# Patient Record
Sex: Female | Born: 1940 | Race: White | Hispanic: No | Smoking: Never smoker
Health system: Southern US, Community
[De-identification: ages and names within clinical notes are randomized; demographics above are authoritative.]

## PROBLEM LIST (undated history)

## (undated) DIAGNOSIS — M1990 Unspecified osteoarthritis, unspecified site: Secondary | ICD-10-CM

## (undated) DIAGNOSIS — I4891 Unspecified atrial fibrillation: Secondary | ICD-10-CM

## (undated) DIAGNOSIS — I639 Cerebral infarction, unspecified: Secondary | ICD-10-CM

## (undated) DIAGNOSIS — I10 Essential (primary) hypertension: Secondary | ICD-10-CM

## (undated) DIAGNOSIS — G709 Myoneural disorder, unspecified: Secondary | ICD-10-CM

## (undated) DIAGNOSIS — E039 Hypothyroidism, unspecified: Secondary | ICD-10-CM

## (undated) DIAGNOSIS — Z9889 Other specified postprocedural states: Secondary | ICD-10-CM

## (undated) DIAGNOSIS — K219 Gastro-esophageal reflux disease without esophagitis: Secondary | ICD-10-CM

## (undated) DIAGNOSIS — I509 Heart failure, unspecified: Secondary | ICD-10-CM

## (undated) DIAGNOSIS — D649 Anemia, unspecified: Secondary | ICD-10-CM

## (undated) DIAGNOSIS — I428 Other cardiomyopathies: Secondary | ICD-10-CM

## (undated) DIAGNOSIS — Z950 Presence of cardiac pacemaker: Secondary | ICD-10-CM

## (undated) DIAGNOSIS — T7840XA Allergy, unspecified, initial encounter: Secondary | ICD-10-CM

## (undated) DIAGNOSIS — I447 Left bundle-branch block, unspecified: Secondary | ICD-10-CM

## (undated) DIAGNOSIS — H269 Unspecified cataract: Secondary | ICD-10-CM

## (undated) DIAGNOSIS — N189 Chronic kidney disease, unspecified: Secondary | ICD-10-CM

## (undated) DIAGNOSIS — E785 Hyperlipidemia, unspecified: Secondary | ICD-10-CM

## (undated) DIAGNOSIS — S73004A Unspecified dislocation of right hip, initial encounter: Secondary | ICD-10-CM

## (undated) DIAGNOSIS — J449 Chronic obstructive pulmonary disease, unspecified: Secondary | ICD-10-CM

## (undated) DIAGNOSIS — M5030 Other cervical disc degeneration, unspecified cervical region: Secondary | ICD-10-CM

## (undated) DIAGNOSIS — Z5189 Encounter for other specified aftercare: Secondary | ICD-10-CM

## (undated) HISTORY — DX: Essential (primary) hypertension: I10

## (undated) HISTORY — PX: OTHER SURGICAL HISTORY: SHX169

## (undated) HISTORY — DX: Unspecified osteoarthritis, unspecified site: M19.90

## (undated) HISTORY — DX: Unspecified dislocation of right hip, initial encounter: S73.004A

## (undated) HISTORY — DX: Other specified postprocedural states: Z98.890

## (undated) HISTORY — DX: Gastro-esophageal reflux disease without esophagitis: K21.9

## (undated) HISTORY — DX: Allergy, unspecified, initial encounter: T78.40XA

## (undated) HISTORY — DX: Cerebral infarction, unspecified: I63.9

## (undated) HISTORY — DX: Unspecified atrial fibrillation: I48.91

## (undated) HISTORY — DX: Other cardiomyopathies: I42.8

## (undated) HISTORY — PX: BREAST BIOPSY: SHX20

## (undated) HISTORY — DX: Chronic obstructive pulmonary disease, unspecified: J44.9

## (undated) HISTORY — PX: THYROIDECTOMY: SHX17

## (undated) HISTORY — DX: Heart failure, unspecified: I50.9

## (undated) HISTORY — PX: ABDOMINAL HYSTERECTOMY: SHX81

## (undated) HISTORY — DX: Unspecified cataract: H26.9

## (undated) HISTORY — PX: APPENDECTOMY: SHX54

## (undated) HISTORY — DX: Other cervical disc degeneration, unspecified cervical region: M50.30

## (undated) HISTORY — PX: CHOLECYSTECTOMY: SHX55

## (undated) HISTORY — DX: Chronic kidney disease, unspecified: N18.9

## (undated) HISTORY — DX: Anemia, unspecified: D64.9

## (undated) HISTORY — DX: Hyperlipidemia, unspecified: E78.5

## (undated) HISTORY — PX: MANDIBLE SURGERY: SHX707

## (undated) HISTORY — DX: Hypothyroidism, unspecified: E03.9

---
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
## 1968-07-17 HISTORY — PX: OTHER SURGICAL HISTORY: SHX169

## 2001-04-29 ENCOUNTER — Ambulatory Visit (HOSPITAL_COMMUNITY): Admission: RE | Admit: 2001-04-29 | Discharge: 2001-04-29 | Payer: Self-pay | Admitting: Pulmonary Disease

## 2001-05-09 ENCOUNTER — Encounter: Payer: Self-pay | Admitting: Internal Medicine

## 2001-05-09 ENCOUNTER — Ambulatory Visit (HOSPITAL_COMMUNITY): Admission: RE | Admit: 2001-05-09 | Discharge: 2001-05-09 | Payer: Self-pay | Admitting: Internal Medicine

## 2001-05-09 ENCOUNTER — Encounter: Payer: Self-pay | Admitting: Cardiology

## 2001-05-15 ENCOUNTER — Ambulatory Visit (HOSPITAL_COMMUNITY): Admission: RE | Admit: 2001-05-15 | Discharge: 2001-05-16 | Payer: Self-pay | Admitting: *Deleted

## 2001-05-28 ENCOUNTER — Encounter: Payer: Self-pay | Admitting: Cardiology

## 2001-08-20 ENCOUNTER — Other Ambulatory Visit: Admission: RE | Admit: 2001-08-20 | Discharge: 2001-08-20 | Payer: Self-pay | Admitting: Obstetrics and Gynecology

## 2002-07-21 ENCOUNTER — Ambulatory Visit (HOSPITAL_COMMUNITY): Admission: RE | Admit: 2002-07-21 | Discharge: 2002-07-21 | Payer: Self-pay | Admitting: Cardiology

## 2002-09-22 ENCOUNTER — Encounter: Payer: Self-pay | Admitting: Orthopaedic Surgery

## 2002-09-22 ENCOUNTER — Ambulatory Visit (HOSPITAL_COMMUNITY): Admission: RE | Admit: 2002-09-22 | Discharge: 2002-09-22 | Payer: Self-pay | Admitting: Orthopaedic Surgery

## 2004-03-04 ENCOUNTER — Encounter: Admission: RE | Admit: 2004-03-04 | Discharge: 2004-03-04 | Payer: Self-pay | Admitting: Orthopedic Surgery

## 2004-03-22 ENCOUNTER — Encounter: Admission: RE | Admit: 2004-03-22 | Discharge: 2004-03-22 | Payer: Self-pay | Admitting: Orthopedic Surgery

## 2004-04-11 ENCOUNTER — Encounter: Admission: RE | Admit: 2004-04-11 | Discharge: 2004-04-11 | Payer: Self-pay | Admitting: Orthopedic Surgery

## 2004-06-10 ENCOUNTER — Ambulatory Visit: Payer: Self-pay | Admitting: Cardiology

## 2004-06-14 ENCOUNTER — Ambulatory Visit: Payer: Self-pay | Admitting: Cardiology

## 2004-06-14 ENCOUNTER — Ambulatory Visit (HOSPITAL_COMMUNITY): Admission: RE | Admit: 2004-06-14 | Discharge: 2004-06-14 | Payer: Self-pay | Admitting: Cardiology

## 2005-02-22 ENCOUNTER — Encounter: Payer: Self-pay | Admitting: Emergency Medicine

## 2005-02-22 ENCOUNTER — Ambulatory Visit: Payer: Self-pay | Admitting: Cardiology

## 2005-02-22 ENCOUNTER — Inpatient Hospital Stay (HOSPITAL_COMMUNITY): Admission: AD | Admit: 2005-02-22 | Discharge: 2005-02-23 | Payer: Self-pay | Admitting: Cardiology

## 2005-02-23 ENCOUNTER — Encounter: Payer: Self-pay | Admitting: Cardiology

## 2005-02-28 ENCOUNTER — Ambulatory Visit: Payer: Self-pay | Admitting: *Deleted

## 2005-03-30 ENCOUNTER — Ambulatory Visit: Payer: Self-pay | Admitting: Cardiology

## 2005-04-06 ENCOUNTER — Ambulatory Visit: Payer: Self-pay | Admitting: *Deleted

## 2005-05-01 ENCOUNTER — Ambulatory Visit: Payer: Self-pay | Admitting: Cardiology

## 2006-02-08 ENCOUNTER — Encounter: Admission: RE | Admit: 2006-02-08 | Discharge: 2006-02-08 | Payer: Self-pay | Admitting: Orthopedic Surgery

## 2006-03-01 ENCOUNTER — Encounter: Admission: RE | Admit: 2006-03-01 | Discharge: 2006-03-01 | Payer: Self-pay | Admitting: Orthopedic Surgery

## 2006-04-20 ENCOUNTER — Ambulatory Visit: Payer: Self-pay | Admitting: Cardiology

## 2006-04-24 ENCOUNTER — Encounter: Admission: RE | Admit: 2006-04-24 | Discharge: 2006-04-24 | Payer: Self-pay | Admitting: Orthopedic Surgery

## 2006-11-30 ENCOUNTER — Ambulatory Visit (HOSPITAL_COMMUNITY): Admission: RE | Admit: 2006-11-30 | Discharge: 2006-11-30 | Payer: Self-pay | Admitting: *Deleted

## 2007-02-25 ENCOUNTER — Ambulatory Visit: Payer: Self-pay

## 2007-03-22 ENCOUNTER — Ambulatory Visit: Payer: Self-pay | Admitting: Cardiology

## 2007-07-18 DIAGNOSIS — Z9889 Other specified postprocedural states: Secondary | ICD-10-CM

## 2007-07-18 HISTORY — PX: COLONOSCOPY: SHX174

## 2007-07-18 HISTORY — DX: Other specified postprocedural states: Z98.890

## 2008-06-18 ENCOUNTER — Ambulatory Visit: Payer: Self-pay | Admitting: Gastroenterology

## 2008-06-29 ENCOUNTER — Ambulatory Visit: Payer: Self-pay | Admitting: Gastroenterology

## 2008-06-29 LAB — HM COLONOSCOPY

## 2008-07-16 ENCOUNTER — Ambulatory Visit: Payer: Self-pay | Admitting: Cardiology

## 2008-07-16 ENCOUNTER — Ambulatory Visit (HOSPITAL_COMMUNITY): Admission: RE | Admit: 2008-07-16 | Discharge: 2008-07-16 | Payer: Self-pay | Admitting: Cardiology

## 2008-07-16 LAB — CONVERTED CEMR LAB
ALT: 18 units/L
Albumin: 4.3 g/dL
Bilirubin, Direct: 0.4 mg/dL
CO2: 25 meq/L
Chloride: 102 meq/L
Creatinine, Ser: 1.07 mg/dL
Glucose, Bld: 83 mg/dL
HCT: 39.3 %
Hemoglobin: 12.9 g/dL
Platelets: 248 10*3/uL
Potassium: 4 meq/L
Sodium: 141 meq/L
TSH: 1.044 microintl units/mL
Total Protein: 7.2 g/dL

## 2008-12-03 ENCOUNTER — Encounter: Payer: Self-pay | Admitting: Cardiology

## 2008-12-04 ENCOUNTER — Encounter (INDEPENDENT_AMBULATORY_CARE_PROVIDER_SITE_OTHER): Payer: Self-pay | Admitting: *Deleted

## 2008-12-07 ENCOUNTER — Encounter: Payer: Self-pay | Admitting: Cardiology

## 2009-01-01 ENCOUNTER — Encounter (INDEPENDENT_AMBULATORY_CARE_PROVIDER_SITE_OTHER): Payer: Self-pay

## 2009-01-01 LAB — CONVERTED CEMR LAB
AST: 17 units/L
Albumin: 4 g/dL
Alkaline Phosphatase: 59 units/L
Bilirubin, Direct: 0.1 mg/dL
TSH: 0.246 microintl units/mL
Triglycerides: 66 mg/dL

## 2009-04-28 ENCOUNTER — Ambulatory Visit: Payer: Self-pay | Admitting: Cardiology

## 2009-04-28 ENCOUNTER — Encounter (INDEPENDENT_AMBULATORY_CARE_PROVIDER_SITE_OTHER): Payer: Self-pay

## 2009-04-28 ENCOUNTER — Encounter (INDEPENDENT_AMBULATORY_CARE_PROVIDER_SITE_OTHER): Payer: Self-pay | Admitting: *Deleted

## 2009-04-28 DIAGNOSIS — E039 Hypothyroidism, unspecified: Secondary | ICD-10-CM

## 2009-04-28 DIAGNOSIS — M1990 Unspecified osteoarthritis, unspecified site: Secondary | ICD-10-CM | POA: Insufficient documentation

## 2009-04-28 DIAGNOSIS — I428 Other cardiomyopathies: Secondary | ICD-10-CM

## 2009-04-28 DIAGNOSIS — I447 Left bundle-branch block, unspecified: Secondary | ICD-10-CM

## 2009-04-28 DIAGNOSIS — E785 Hyperlipidemia, unspecified: Secondary | ICD-10-CM

## 2009-04-28 DIAGNOSIS — I10 Essential (primary) hypertension: Secondary | ICD-10-CM

## 2009-04-28 LAB — CONVERTED CEMR LAB
AST: 18 units/L
Alkaline Phosphatase: 69 units/L
Alkaline Phosphatase: 69 units/L (ref 39–117)
CO2: 25 meq/L
Calcium: 8.8 mg/dL
Chloride: 100 meq/L
Creatinine, Ser: 0.88 mg/dL
Glucose, Bld: 78 mg/dL
Potassium: 3.8 meq/L
Total Protein: 6.8 g/dL

## 2009-10-04 ENCOUNTER — Encounter (INDEPENDENT_AMBULATORY_CARE_PROVIDER_SITE_OTHER): Payer: Self-pay | Admitting: *Deleted

## 2009-10-04 LAB — CONVERTED CEMR LAB
CO2: 24 meq/L
Creatinine, Ser: 0.99 mg/dL
Eosinophils Relative: 2 %
HCT: 40.3 %
HDL: 42 mg/dL
Hemoglobin: 13.2 g/dL
LDL Cholesterol: 158 mg/dL
Lymphocytes Relative: 23 %
Lymphs Abs: 1.5 10*3/uL
MCV: 91.8 fL
Monocytes Absolute: 0.5 10*3/uL
Monocytes Relative: 8 %
Platelets: 226 10*3/uL
RDW: 13.4 %
Sodium: 138 meq/L
TSH: 0.353 microintl units/mL
Total Protein: 6.8 g/dL

## 2010-05-24 ENCOUNTER — Encounter (INDEPENDENT_AMBULATORY_CARE_PROVIDER_SITE_OTHER): Payer: Self-pay | Admitting: *Deleted

## 2010-05-30 ENCOUNTER — Encounter (INDEPENDENT_AMBULATORY_CARE_PROVIDER_SITE_OTHER): Payer: Self-pay | Admitting: *Deleted

## 2010-05-31 ENCOUNTER — Ambulatory Visit: Payer: Self-pay | Admitting: Cardiology

## 2010-06-02 ENCOUNTER — Encounter (INDEPENDENT_AMBULATORY_CARE_PROVIDER_SITE_OTHER): Payer: Self-pay | Admitting: *Deleted

## 2010-06-02 LAB — CONVERTED CEMR LAB
ALT: 16 units/L (ref 0–35)
Alkaline Phosphatase: 64 units/L (ref 39–117)
BUN: 15 mg/dL (ref 6–23)
Basophils Relative: 1 % (ref 0–1)
CO2: 27 meq/L (ref 19–32)
Chloride: 99 meq/L (ref 96–112)
Creatinine, Ser: 1.07 mg/dL (ref 0.40–1.20)
Eosinophils Absolute: 0.1 10*3/uL (ref 0.0–0.7)
Glucose, Bld: 84 mg/dL (ref 70–99)
Lymphocytes Relative: 29 % (ref 12–46)
MCHC: 33.2 g/dL (ref 30.0–36.0)
MCV: 90.2 fL (ref 78.0–100.0)
Monocytes Absolute: 0.8 10*3/uL (ref 0.1–1.0)
Monocytes Relative: 9 % (ref 3–12)
Neutrophils Relative %: 60 % (ref 43–77)
Potassium: 4 meq/L (ref 3.5–5.3)
RDW: 13.9 % (ref 11.5–15.5)
Total Bilirubin: 0.4 mg/dL (ref 0.3–1.2)

## 2010-07-12 ENCOUNTER — Encounter: Payer: Self-pay | Admitting: Cardiology

## 2010-07-12 LAB — CONVERTED CEMR LAB: TSH: 2.02 microintl units/mL (ref 0.350–4.500)

## 2010-08-02 ENCOUNTER — Telehealth (INDEPENDENT_AMBULATORY_CARE_PROVIDER_SITE_OTHER): Payer: Self-pay | Admitting: *Deleted

## 2010-08-16 NOTE — Miscellaneous (Signed)
Summary: labs cmp,04/28/2009  Clinical Lists Changes  Observations: Added new observation of CALCIUM: 8.8 mg/dL (16/04/9603 54:09) Added new observation of ALBUMIN: 4.4 g/dL (81/19/1478 29:56) Added new observation of PROTEIN, TOT: 6.8 g/dL (21/30/8657 84:69) Added new observation of SGPT (ALT): 17 units/L (04/28/2009 10:34) Added new observation of SGOT (AST): 18 units/L (04/28/2009 10:34) Added new observation of ALK PHOS: 69 units/L (04/28/2009 10:34) Added new observation of CREATININE: 0.88 mg/dL (62/95/2841 32:44) Added new observation of BUN: 12 mg/dL (07/19/7251 66:44) Added new observation of BG RANDOM: 78 mg/dL (03/47/4259 56:38) Added new observation of CO2 PLSM/SER: 25 meq/L (04/28/2009 10:34) Added new observation of CL SERUM: 100 meq/L (04/28/2009 10:34) Added new observation of K SERUM: 3.8 meq/L (04/28/2009 10:34) Added new observation of NA: 137 meq/L (04/28/2009 10:34)

## 2010-08-16 NOTE — Letter (Signed)
Summary: [place] [facility], [facility] at [employer]  [address] [place] [address]   Phone: [phone]  Fax: [phone]     June 02, 2010 MRN: [id number]   [name] [address] [place], [place]  [address]      YOUR LAB WORK IS DUE   July 12, 2010  Please go to [facility], located across the street from [facility] on the second floor.  Hours are Monday - Friday 7am until 7:30pm         Saturday 8am until 12noon      _x_ YOUR LABWORK IS NOT FASTING --YOU MAY EAT PRIOR TO LABWORK

## 2010-08-16 NOTE — Assessment & Plan Note (Signed)
Summary: PER PATIENTS PHONE CALL /SN  Medications Added SYNTHROID 100 MCG TABS (LEVOTHYROXINE SODIUM) Take 1 tablet by mouth once a day      Allergies Added: NKDA  Visit Type:  Follow-up Primary Provider:  Dr. Kari Baars   History of Present Illness: [name] returns to the office for annual assessment of cardiomyopathy.  Her most recent echocardiogram was performed approximately 3 years ago and revealed an estimated ejection fraction of 40%.  Since then, she has been stable with no signs or symptoms to suggest congestive heart failure.  She does have a degree of chronic fatigue.  Her most notable symptoms have included nasal congestion and sinus pressure that she believes are related to allergies.  She has had no acute medical conditions nor required urgent medical care over the past 12 months.   Current Medications (verified): 1)  Diltiazem Hcl Er Beads 180 Mg Xr24h-Cap (Diltiazem Hcl Er Beads) .... Take One Capsule By Mouth Daily 2)  Benazepril Hcl 40 Mg Tabs (Benazepril Hcl) .... Take 1 Tablet Daily 3)  Chlorthalidone 25 Mg Tabs (Chlorthalidone) .... Take 1 Tablet By Mouth Once A Day 4)  Carvedilol 25 Mg Tabs (Carvedilol) .... Take 1 Tab Two Times A Day 5)  Synthroid 100 Mcg Tabs (Levothyroxine Sodium) .... Take 1 Tablet By Mouth Once A Day 6)  Prevacid 30 Mg Cpdr (Lansoprazole) .... Take 1 Tab Daily 7)  Allegra 180 Mg Tabs (Fexofenadine Hcl) .... Take 1 Tab Daily 8)  Aspirin 325 Mg Tabs (Aspirin) .... Take 1 Tab Daily 9)  Centrum Silver  Tabs (Multiple Vitamins-Minerals) .... Take 1 Tab Daily 10)  Tramadol Hcl 50 Mg Tabs (Tramadol Hcl) .... Take 1 Tab As Needed For Pain 11)  Vitamin D 400 Unit Caps (Cholecalciferol) .... Take 1 Tab Daily  Allergies (verified): No Known Drug Allergies  Comments:  Nurse/Medical Assistant: patient didn't bring meds or list reviewed meds form last ov and stated all meds are correct the only thing took off of list was zithromax  Past  History:  PMH, FH, and Social History reviewed and updated.  Review of Systems       The patient complains of headaches.  The patient denies weight loss, weight gain, chest pain, syncope, dyspnea on exertion, peripheral edema, prolonged cough, and abdominal pain.    Vital Signs:  Patient profile:   70 year old female Weight:      212 pounds BMI:     33.32 O2 Sat:      96 % on Room air Pulse rate:   71 / minute BP sitting:   118 / 71  (right arm)  Vitals Entered By: Dreama Saa, CNA (May 31, 2010 1:29 PM)  O2 Flow:  Room air  Physical Exam  General:  Moderately overweight; well developed; no acute distress:   Neck-No JVD; no carotid bruits: Lungs-No tachypnea, no rales; no rhonchi; no wheezes: Cardiovascular-normal PMI; normal S1 and increased S2; modest basilar systolic murmur Abdomen-BS normal; soft and non-tender without masses or organomegaly:  Musculoskeletal-No deformities, no cyanosis or clubbing: Neurologic-Normal cranial nerves; symmetric strength and tone:  Skin-Warm, no significant lesions: Extremities-1+ distal pulses; no edema:     Impression & Recommendations:  Problem # 1:  CARDIOMYOPATHY (ICD-425.4) Patient continues to do well with no symptoms definitely attributable to cardiac disease and only mildly impaired left ventricular systolic function when last assessed.  Current medical therapy is optimal and will be continued.  These medications are also providing excellent control of  hypertension.  Problem # 2:  HYPERLIPIDEMIA (ICD-272.4) Most recent lipid parameters were somewhat suboptimal, but not indicative of the need for pharmacologic therapy, especially in the absence of known vascular disease.  CHOL: 219 (10/04/2009)   LDL: 158 (10/04/2009)   HDL: 42 (10/04/2009)   TG: 95 (10/04/2009)  Other Orders: T-Comprehensive Metabolic Panel ([address]) T-CBC w/Diff ([address]-91478) T-TSH ([address]-13086) Future Orders: T-TSH ([id number]) ...  07/12/2010  Patient Instructions: 1)  Your physician recommends that you schedule a follow-up appointment in: 1 year 2)  Your physician recommends that you return for lab work in: today Prescriptions: SYNTHROID 100 MCG TABS (LEVOTHYROXINE SODIUM) Take 1 tablet by mouth once a day  #30 x 3   Entered by:   Teressa Lower RN   Authorized by:   Kathlen Brunswick, MD, [facility]   Signed by:   Teressa Lower RN on 06/02/2010   Method used:   Electronically to        [facility] (retail)       [address]       [place], [place]  [address]       Ph: [phone]       Fax: [phone]   RxID:   [id number]

## 2010-08-16 NOTE — Miscellaneous (Signed)
Summary: LABS CBCD,BMPLIPIDS,LIVER 10/04/2009  Clinical Lists Changes  Observations: Added new observation of CALCIUM: 9.1 mg/dL (40/98/1191 47:82) Added new observation of ALBUMIN: 4.2 g/dL (95/62/1308 65:78) Added new observation of PROTEIN, TOT: 6.8 g/dL (46/96/2952 84:13) Added new observation of SGPT (ALT): 15 units/L (10/04/2009 16:13) Added new observation of SGOT (AST): 17 units/L (10/04/2009 16:13) Added new observation of ALK PHOS: 71 units/L (10/04/2009 16:13) Added new observation of BILI DIRECT: 0.1 mg/dL (24/40/1027 25:36) Added new observation of CREATININE: 0.99 mg/dL (64/40/3474 25:95) Added new observation of BUN: 16 mg/dL (63/87/5643 32:95) Added new observation of BG RANDOM: 85 mg/dL (18/84/1660 63:01) Added new observation of CO2 PLSM/SER: 24 meq/L (10/04/2009 16:13) Added new observation of CL SERUM: 99 meq/L (10/04/2009 16:13) Added new observation of K SERUM: 4.1 meq/L (10/04/2009 16:13) Added new observation of NA: 138 meq/L (10/04/2009 16:13) Added new observation of LDL: 158 mg/dL (60/04/9322 55:73) Added new observation of HDL: 42 mg/dL (22/08/5425 06:23) Added new observation of TRIGLYC TOT: 95 mg/dL (76/28/3151 76:16) Added new observation of CHOLESTEROL: 219 mg/dL (07/37/1062 69:48) Added new observation of TSH: 0.353 microintl units/mL (10/04/2009 16:13) Added new observation of ABSOLUTE BAS: 0.0 K/uL (10/04/2009 16:13) Added new observation of BASOPHIL %: 1 % (10/04/2009 16:13) Added new observation of EOS ABSLT: 0.1 K/uL (10/04/2009 16:13) Added new observation of % EOS AUTO: 2 % (10/04/2009 16:13) Added new observation of ABSOLUTE MON: 0.5 K/uL (10/04/2009 16:13) Added new observation of MONOCYTE %: 8 % (10/04/2009 16:13) Added new observation of ABS LYMPHOCY: 1.5 K/uL (10/04/2009 16:13) Added new observation of LYMPHS %: 23 % (10/04/2009 16:13) Added new observation of PLATELETK/UL: 226 K/uL (10/04/2009 16:13) Added new observation of RDW: 13.4 %  (10/04/2009 16:13) Added new observation of MCHC RBC: 32.8 g/dL (54/62/7035 00:93) Added new observation of MCV: 91.8 fL (10/04/2009 16:13) Added new observation of HCT: 40.3 % (10/04/2009 16:13) Added new observation of HGB: 13.2 g/dL (81/82/9937 16:96) Added new observation of RBC M/UL: 4.39 M/uL (10/04/2009 16:13) Added new observation of WBC COUNT: 6.4 10*3/microliter (10/04/2009 16:13)

## 2010-08-18 NOTE — Progress Notes (Signed)
Summary: lab results   Phone Note Call from Patient   Caller: Patient Reason for Call: Lab or Test Results Summary of Call: patient would like lab results Initial call taken by: Raechel Ache [facility],  August 02, 2010 9:48 AM  Follow-up for Phone Call        results called to pt, verbalized understanding Follow-up by: Teressa Lower RN,  August 02, 2010 10:19 AM

## 2010-09-13 ENCOUNTER — Ambulatory Visit (INDEPENDENT_AMBULATORY_CARE_PROVIDER_SITE_OTHER): Payer: Medicare Other | Admitting: Gastroenterology

## 2010-09-13 ENCOUNTER — Encounter: Payer: Self-pay | Admitting: Gastroenterology

## 2010-09-13 DIAGNOSIS — R110 Nausea: Secondary | ICD-10-CM | POA: Insufficient documentation

## 2010-09-13 DIAGNOSIS — K219 Gastro-esophageal reflux disease without esophagitis: Secondary | ICD-10-CM

## 2010-09-14 ENCOUNTER — Encounter: Payer: Self-pay | Admitting: Internal Medicine

## 2010-09-22 NOTE — Assessment & Plan Note (Signed)
Summary: nausea x 1 month   Vital Signs:  Patient profile:   71 year old female Height:      67 inches Weight:      219.50 pounds BMI:     34.50 Temp:     97.6 degrees F oral Pulse rate:   64 / minute BP sitting:   122 / 64  (left arm)  Vitals Entered By: Carolan Clines LPN (September 13, 2010 9:34 AM)  Visit Type:  Initial Consult Primary Care Provider:  Dr. Kari Baars   History of Present Illness: Ms. Peek presents today at the request of Dr. Juanetta Gosling secondary to intermittent nausea. She states it seems to usually be associated with sinus infections. Has had issues with nausea for the past few years, off and on. States was doing well with Prevacid until generic form available, then noticed nausea when taking generic OTC prevacid. Lasts about 15-20 minutes, then resolves. Denies nausea with eating. s/p chole in 1976. +reflux, on Prevacid, doesn't feel like working as well. Has not tried other PPIs. Occasional nocturnal reflux. No dysphagia/odynophagia. No loss of appetite or wt loss. No vomiting. Last colonoscopy 3 years ago by Dr. Jarold Motto in [place], per report normal. Husband passed away from colon ca a few years ago. Last EGD in the 1980s, secondary to reflux.  States takes blood sugar with nausea episodes, no hypogylcemia.  Recent labs from Dr. Hawkins:Aug 15 2010 H/H 13/39, LFTs nl, TSH nl, serum H.pylori negative  Current Medications (verified): 1)  Diltiazem Hcl Er Beads 180 Mg Xr24h-Cap (Diltiazem Hcl Er Beads) .... Take One Capsule By Mouth Daily 2)  Benazepril Hcl 40 Mg Tabs (Benazepril Hcl) .... Take 1 Tablet Daily 3)  Chlorthalidone 25 Mg Tabs (Chlorthalidone) .... Take 1 Tablet By Mouth Once A Day 4)  Carvedilol 25 Mg Tabs (Carvedilol) .... Take 1 Tab Two Times A Day 5)  Synthroid 100 Mcg Tabs (Levothyroxine Sodium) .... Take 1 Tablet By Mouth Once A Day 6)  Prevacid 30 Mg Cpdr (Lansoprazole) .... Take 1 Tab Daily 7)  Allegra 180 Mg Tabs (Fexofenadine Hcl) .... Take  1 Tab Daily 8)  Aspirin 325 Mg Tabs (Aspirin) .... Take 1 Tab Daily 9)  Centrum Silver  Tabs (Multiple Vitamins-Minerals) .... Take 1 Tab Daily 10)  Tramadol Hcl 50 Mg Tabs (Tramadol Hcl) .... Take 1 Tab As Needed For Pain 11)  Vitamin D 400 Unit Caps (Cholecalciferol) .... Take 1 Tab Daily 12)  Flonase 50 Mcg/act Susp (Fluticasone Propionate) .... One Spray Each Nostrile  Allergies (verified): No Known Drug Allergies  Past History:  Past Medical History: Reviewed history from 04/28/2009 and no changes required. 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 1970s appendectomy hysterectomy breast biopsy bilaterally (benign) jaw surgery (secondary to underbite)  Family History: Mother:CVA at age 80, brain tumor, deceased Father: killed in WWII A-Fib No FH of Colon Cancer:  Social History: Widowed: husband passed away 24-Nov-2008 from MI 2 children: boys Tobacco Use - No.  Alcohol Use - no  Review of Systems General:  Denies fever, chills, and anorexia.  Eyes:  Denies blurring, irritation, and discharge. ENT:  Denies sore throat, hoarseness, and difficulty swallowing. CV:  Denies chest pains and syncope. Resp:  Denies dyspnea at rest and wheezing. GI:  See HPI. GU:  Denies urinary burning and urinary frequency. MS:  Denies joint pain / LOM, joint swelling, and joint stiffness. Derm:  Denies rash, itching, and dry skin. Neuro:  Denies weakness and syncope. Psych:  Denies depression and anxiety.  Physical  Exam  General:  Well developed, well nourished, no acute distress. Head:  Normocephalic and atraumatic. Eyes:  PERRLA, no icterus. Mouth:  No deformity or lesions, dentition normal. Lungs:  Clear throughout to auscultation. Heart:  Regular rate and rhythm; no murmurs, rubs,  or bruits. Abdomen:  +BS, soft, non-tender, non-distended, no HSM. no rebound or guarding.  Msk:  Symmetrical with no gross deformities. Normal posture. Pulses:  Normal pulses noted. Extremities:  No clubbing, cyanosis, edema or deformities noted. Neurologic:  Alert and  oriented x4;  grossly normal neurologically. Skin:  Intact without significant lesions or rashes. Psych:  Alert and cooperative. Normal mood and affect.   Impression & Recommendations:  Problem # 1:  GERD (ICD-13.82)  70 year old pleasant Caucasian female with hx of chronic GERD, intermittent nausea X several years. Has done well with Prevacid in past, yet noted significant difference when switching to generic, which is approximately when nausea began. Nausea also seems to be r/t sinus exacerbations. + intermittent reflux. GERD and nausea may very well be r/t medication changes, differentials include gastritis, PUD, doubt gastroparesis.  Trial of Dexilant 60 mg by mouth daily, samples given Pt to call with PR in 10 days. If no improvement, may benefit from EGD f/u in 3 mos Pt uses [place] pharmacy  Orders: Consultation Level III [phone])  Problem # 2:  NAUSEA (ICD-787.02)  See # 1  Orders: Consultation Level III ([address])   Orders Added: 1)  Consultation Level III [[address]]  Appended Document: nausea x 1 month 3 MONTH F/U OPV IS IN THE COMPUTER

## 2010-09-22 NOTE — Letter (Signed)
Summary: REFERRAL FROM DR Juanetta Gosling  REFERRAL FROM DR HAWKINS   Imported By: Rexene Alberts 09/14/2010 13:44:02  _____________________________________________________________________  External Attachment:    Type:   Image     Comment:   External Document

## 2010-09-26 ENCOUNTER — Telehealth (INDEPENDENT_AMBULATORY_CARE_PROVIDER_SITE_OTHER): Payer: Self-pay

## 2010-10-04 NOTE — Progress Notes (Signed)
Summary: pt doing well on dexilant  Phone Note Call from Patient Call back at Home Phone [phone]   Caller: Patient Summary of Call: pt called- stated she was doing much better on the dexilant and would like rx sent  to [facility] pharmacy.  Initial call taken by: Hendricks Limes LPN,  September 26, 2010 9:34 AM     Appended Document: pt doing well on dexilant  Excellent. Rx sent. f/u in 3 mos as planned.   Prescriptions: DEXILANT 60 MG CPDR (DEXLANSOPRAZOLE) take 1 by mouth daily  #31 x 3   Entered and Authorized by:   Gerrit Halls NP   Signed by:   Gerrit Halls NP on 09/26/2010   Method used:   Faxed to ...       [facility] (retail)       [address]       [place], [place]  [address]       Ph: [phone]       Fax: [phone]   RxID:   [id number]

## 2010-11-29 NOTE — Letter (Signed)
March 22, 2007    Ramon Dredge L. Juanetta Gosling, M.D.  [address]  [place], [place]  [address]   RE:  ALASHA, MCGUINNESS  MRN:  [id number]  /  DOB:  [birth year]   Dear Ed:   Ms. Backer returns to the office for continued assessment and treatment  of nonischemic cardiomyopathy and hypertension.  As you know she  recently experienced some vague neurological symptoms in her left leg.  She was seen by a number of physicians and ultimately by a neurologist.  She was unable to have an MRI study of her brain due to hardware in her  jaw.  She did have a CT scan which apparently showed a small CVA.  Carotid ultrasound showed mild atherosclerotic changes.  Dr. Nash Shearer has  recommended clopidogrel and atorvastatin.   Ms. Player has been asymptomatic from a cardiac standpoint.  Repeat  echocardiogram a few weeks ago showed an ejection fraction of 0.40,  stable or improved from 2004.   CURRENT MEDICATIONS:  1. Prevacid 30 mg daily.  2. Diltiazem 180 mg daily.  3. Chlorthalidone 25 mg daily.  4. Carvedilol 25 mg b.i.d.  5. Benazepril 40 mg daily.  6. Levothyroxine 0.125 mg daily.  7. Allegra 180 mg daily.   PHYSICAL EXAMINATION:  GENERAL:  A pleasant woman in no acute distress.  VITAL SIGNS:  Weight 212 1 pound less than in 2007, blood pressure  130/60, heart rate 60 and regular, respirations 18.  NECK:  No jugular venous distention; no carotid bruits.  LUNGS:  Clear.  CARDIAC:  Normal first and second heart sounds, fourth heart sound  present.  ABDOMEN:  Soft and nontender, no organomegaly.  EXTREMITIES:  1+ edema.   IMPRESSION:  Ms. Leeth is doing generally well from a cardiac  standpoint.  It would be reasonable to treat her with statins in light  of cerebrovascular disease and mild hyperlipidemia.  In order to  decrease the cost of her therapy, we will use simvastatin 40 mg daily.  Clopidogrel is a reasonable medication for her cerebrovascular disease.  She will start this immediately.  A lipid  profile and chemistry profile  will be obtained in one month.  I will see her again in 10 months.  Due  to the improvement in left ventricular systolic function, she is no  longer requires consideration for implantable defibrillator.    Sincerely,      Gerrit Friends. Dietrich Pates, MD, [facility]  Electronically Signed    RMR/MedQ  DD: 03/22/2007  DT: 03/22/2007  Job #: [id number]   CC:    Estanislado Pandy, MD

## 2010-11-29 NOTE — Letter (Signed)
July 16, 2008     RE:  [name], [name]  MRN:  [id number]  /  DOB:  Aug 26, 1940   Dear Renae Fickle,   [name] returns to the office for continued assessment and treatment  of nonischemic cardiomyopathy.  Since I last saw [name], 15 months ago, she  has done generally well.  She has had some progression of exercise  intolerance with fatigue and dyspnea with mild exertion.  She is unable  to shop as well as she has in the past.  She wonders if an insurance  policy that she carries for help at home should be activated.  Blood  pressure control has been good.  She continues to take aspirin for an  episode of vague neurologic symptoms with evidence for CVA by CT  scanning.   [name] other medications include carvedilol 25 mg b.i.d., diltiazem 180 mg  daily, chlorthalidone 25 mg daily, benazepril 40 mg daily, levothyroxine  0.125 mg daily, Prevacid 30 mg daily, fexofenadine 180 mg daily, and  vitamin D 50,000 units weekly.  She uses tramadol p.r.n. pain in [name]  right knee and right hip due to DJD.   PHYSICAL EXAMINATION:  GENERAL:  On exam, pleasant overweight woman in  no acute distress.  VITAL SIGNS:  The weight is 217, 5 pounds more than last year.  Blood  pressure 125/80, heart rate 70 and regular, respirations 14.  NECK:  No jugular venous distention; no carotid bruits.  LUNGS:  Clear.  CARDIAC:  Normal first heart sounds; increased aortic component of  second heart sounds; no murmur nor gallop appreciated.  ABDOMEN:  Soft and nontender; no organomegaly.  EXTREMITIES:  No edema.  NEUROLOGIC:  Symmetric strength and tone; normal cranial nerves.  SKIN:  No significant lesions.   IMPRESSION:  [name] has exercise intolerance that certainly is  partially due to [name] cardiomyopathy; however, excessive weight and  physical deconditioning are likely playing a  role.  I suggested that she gradually try to increase [name] daily  exercise.  She has not been particularly successful in attempts to lose  weight in the past.  We will check a chemistry profile, CBC, TSH, and  chest x-ray to verify that there is no obvious cause for [name] progressive  symptoms.  If results are good, I will plan to see this nice woman again  in 9 months.    Sincerely,      Gerrit Friends. Dietrich Pates, MD, [facility]  Electronically Signed    RMR/MedQ  DD: 07/16/2008  DT: 07/17/2008  Job #: [id number]

## 2010-12-02 NOTE — Discharge Summary (Signed)
[place]. [facility]  Patient:    BENTLEE, DRIER Visit Number: [id number] MRN: [id number]          Service Type: CAT Location: 3700 [id number] Attending Physician:  Daisey Must Dictated by:   Pennelope Bracken, N.P. Admit Date:  05/15/2001 Discharge Date: 05/16/2001                             Discharge Summary  DATE OF BIRTH:  04/02/1941  REASON FOR ADMISSION:  Elective cardiac catheterization.  DISCHARGE DIAGNOSES: 1. Dilated cardiomyopathy. 2. Hypertension. 3. Family history of arrhythmias.  PROCEDURES PERFORMED THIS ADMISSION:  Cardiac catheterization by Dr. Loraine Leriche Pulsipher on October 30 revealing ejection fraction of 37% with normal coronary arteries.  HISTORY OF PRESENT ILLNESS:  This delightful 70 year old white female had had a long history of chest pressure with radiation to the left shoulder and numbness in the left arm.  These symptoms have been escalating recently and occur with rest or stress.  The patient had described some worsening associated shortness of breath with this over the last several months.  She had been evaluated by Dr. Tenny Craw with a Cardiolite scan in early October which showed an anteroseptal filling defect that appeared to be scar.  Her left ventricular function at that point was calculated to be 39% and this represented a change over a Cardiolite performed a year earlier where the LV function was normal.  Echocardiogram performed May 09, 2001, confirmed the decline in left ventricular ejection fraction and revealed some mild mitral regurgitation.  Based on these findings and the patients symptoms, the possibility of an elective cardiac catheterization was discussed and the patient was agreeable.  HOSPITAL COURSE:  On admission to the hospital, the patient was found to be in no distress.  She was taken to cardiac catheterization where findings were as follows:  Pulmonary arterial pressures were normal.  A left  ventriculogram demonstrated severe global hypokinesis with some associated apical dyskinesis. Ejection fraction was estimated at 37%, and no mitral regurgitation was appreciated.  Left main, left anterior descending, circumflex, and right coronary arteries were all found to be normal.  Based on these findings, plans for medical therapy were initiated with both ACE inhibition and subsequent beta blocker.  The patient was returned to the floor and recovered uneventfully.  On day of discharge, patient was feeling well and had no complaints of chest pain or shortness of breath.  Vital signs were as follows:  Blood pressure 106/56, pulse 64, pulse oximetry 94% on room air.  Chest was clear to auscultation.  Heart displayed a regular rate and rhythm with S1 and S2 clearly heard.  An S4 was appreciated.  Extremities were without clubbing, cyanosis, or edema and exam of the right groin site revealed a small hematoma without bruit.  LABORATORY DATA:  Creatinine 0.8, BUN 13.  Hemoglobin 13.5, platelets 200.  PT was 13.5, INR was 1.2.  Telemetry on day of discharge revealed normal sinus rhythm with a rate in the 70s.  Evidence of left bundle branch block was seen as was first-degree AV block.  Preadmission labs were within normal limits, as follows:  WBC 8.5, sodium 141, potassium 4.1, chloride 106, CO2 25, glucose 93, and calcium 9.6.  Preoperative x-ray obtained October 24 revealed borderline cardiac enlargement with tortuous aorta, bronchitic changes, with minimal subsegmental atelectasis versus scarring in the lingula.  DISCHARGE MEDICATIONS: 1. Altace 2.5 mg one b.i.d. 2.  Coreg 3.125 mg one b.i.d. 3. Aspirin 325 mg one q.d. 4. Estradiol 0.05 patch on Sunday. 5. Protonix 40 mg one q.d. with food. 6. Synthroid 150 mcg one q.d.  INSTRUCTIONS:  No driving, heavy lifting, or exertion for two days.  The patient is advised about activity intolerance with the initiation of beta blocker therapy  and she expresses an understanding of the side effects of such therapy and agrees to call if any serious problems.  DIET:  Low-sodium and low-fat diet is advised.  WOUND CARE:  The patient is to call the office for any groin swelling, bleeding, or bruising.  FOLLOW-UP:  She will see the PA in [place] on May 28, 2001, at 11:00 and will follow up with Dr. Tenny Craw in [place] on June 28, 2001, at 11:45. Dictated by:   Pennelope Bracken, N.P. Attending Physician:  Daisey Must DD:  05/16/01 TD:  05/16/01 Job: [id number]

## 2010-12-02 NOTE — Discharge Summary (Signed)
[name], [name]                 ACCOUNT NO.:  [id number]   MEDICAL RECORD NO.:  [id number]          PATIENT TYPE:  INP   LOCATION:  3729                         FACILITY:  [facility]   PHYSICIAN:  [place] Bing, M.D. [facility] OF BIRTH:  August 07, 1940   DATE OF ADMISSION:  02/22/2005  DATE OF DISCHARGE:  02/23/2005                                 DISCHARGE SUMMARY   REFERRING PHYSICIAN:  Dr. Juanetta Gosling, who is her primary care physician.   PRIMARY DIAGNOSIS:  Chest pressure and dyspnea.   SECONDARY DIAGNOSES:  1.  Hypertension.  2.  Congestive heart failure.  3.  First-degree atrioventricular block.  4.  Left bundle branch block.  5.  Hypothyroidism.   PROCEDURES PERFORMED DURING THIS HOSPITALIZATION:  Echocardiogram.   HISTORY OF PRESENT ILLNESS:  The patient is a 70 year old woman who had  presented with chest pressure and dyspnea.  Initial evaluation revealed no  evidence of myocardial ischemia, but left ventricular systolic impairment  with an estimated ejection fraction between 0.35 and 0.40.  Previous cardiac  catheterization had revealed normal coronary angiography.  The patient also  had left bundle branch block.  The patient was admitted on February 21, 2005  with episodes of mild orthostatic lightheadedness while riding in her family  vehicle for approximately a half hour and has noticed spots before her eyes  as the vehicle approached the house.  She was admitted to the ED at [facility], where additional EKG and chemistries, CBC and cardiac markers  were unremarkable.  She was transferred then to [facility] for  further assessment.   HOSPITAL COURSE:  The patient was admitted for observation status to  telemetry, serial vital signs, TSH, stool for Hemoccult and repeat echo.  We  were also going to consider an AICD if her EF was less than 35%.  Two-  dimensional echo was performed, February 23, 2005, which showed a left  ventricular ejection fraction  approximately 35%, dyssynergy of the septum.  The left lateral wall seems to move better than the other wall.  The EF  remains in the 35% range.  On February 23, 2005, the patient did not show any  further syncopal episodes and had remained in normal sinus rhythm during her  telemetry.  The patient was deemed okay to be discharged to home.  The plan  is to set her up in the [place] office with an event monitor on Tuesday,  February 28, 2005, at 2 p.m.  The patient then will follow up with Dr. Ladona Ridgel  on April 12, 2005 at 4:15 p.m.  He will also increase the patient's  Synthroid to 150 mcg.   DISCHARGE MEDICATIONS:  1.  Coreg 25 mg twice a day.  2.  Ramipril 10 mg twice a day.  3.  Levothyroxine 0.15 mcg per day.  4.  Nexium 40 mg daily.  5.  Allegra one per day.  6.  Chlorthalidone 12.5 mg daily.  7.  Multivitamin one daily.   DISCHARGE LABORATORY DATA:  The patient's discharge labs include a  hemoglobin of 12.0, hematocrit of  34.9, white blood cell count of 7.7 and  platelet count of 217,000; sodium of 133, potassium 3.6, chloride of 102,  CO2 of 24, BUN of 15, creatinine of 1.2, glucose of 111.   DISCHARGE INSTRUCTIONS:  The patient will be discharged on February 23, 2005  with a heart-healthy-restriction diet, no other restrictions.     ______________________________  April Humphrey, NP      [place] Bing, M.D. [facility]  Electronically Signed    AH/MEDQ  D:  02/23/2005  T:  02/24/2005  Job:  [id number]

## 2010-12-02 NOTE — Procedures (Signed)
NAMEILITHYIA, TITZER                 ACCOUNT NO.:  [id number]   MEDICAL RECORD NO.:  [id number]          PATIENT TYPE:  OUT   LOCATION:  RAD                           FACILITY:  [facility]   PHYSICIAN:  Vida Roller, M.D.   DATE OF BIRTH:  [birth year]   DATE OF PROCEDURE:  DATE OF DISCHARGE:                                  ECHOCARDIOGRAM   This is a 70 year old female with a known left bundle branch block and a  known cardiomyopathy.  Her last echo was in October of 2002 which showed  depressed left ventricular systolic function estimated at 35% with left  bundle branch block.  She had mild mitral insufficiency and left ventricular  hypertrophy.  Today's study is technically adequate.  In mode tracings, the  aorta is 30 mm.   Left atrium is 42 mm.   The septum is 11 mm.   The posterior wall is 11 mm.   The left ventricular diastolic dimension is 41 mm.   The left ventricular systolic dimension is 37 mm.   2D AND DOPPLER IMAGING:  The left ventricle is normal in size with depressed  left ventricular systolic function and estimated ejection fraction of 30-  35%.  There is septal motion consistent with a bundle branch block.  There  is generalized, global hypokinesis.   The right ventricle is normal in size with normal systolic function.  Both  atria are enlarged, left greater than right.   The aortic valve is sclerotic with trace insufficiency and no stenosis is  seen.   The mitral valve has mild annular calcification with mild regurgitation.  No  stenosis is seen.   The tricuspid valve appears to have mild regurgitation.   The pulmonic valve is not well seen.  The inferior vena cava is not well  seen.  Pericardial structures are without effusion.   The ascending aorta is not well seen.   ASSESSMENT:  This appears to be an echocardiogram which is similar to the  one from 2002.     Trey Paula   JH/MEDQ  D:  06/14/2004  T:  06/14/2004  Job:  [id number]

## 2010-12-02 NOTE — Letter (Signed)
April 20, 2006     Ramon Dredge L. Juanetta Gosling, M.D.  [address]  [place], [place]  [address]   RE:  [name], [name]  MRN:  [id number]  /  DOB:  1941-02-11   Dear Ed:   [name] returns to the office for continued assessment and treatment of  hypertension and ischemic cardiomyopathy.  Since her last visit, she has  done well.  She continues to have class II-III dyspnea on exertion.  She has  had no intercurrent medical problems.  She has noted mild edema in recent  weeks.  She wonders if the cost of her medicines could be improved.   CURRENT MEDICATIONS:  1. Prevacid 30 mg q.d.  2. Ramipril 10 mg b.i.d.  3. Chlorthalidone 12.5 mg q.d.  4. Carvedilol 12.5 mg q.a.m. and 25 mg q.p.m.  5. Levothyroxine 0.15 mg q.d.  6. Diltiazem 180 mg q.d.  7. Clarinex 5 mg q.d.   ON EXAM:  GENERAL:  Pleasant woman in no acute distress.  VITAL SIGNS:  Weight 213 - stable.  Blood pressure 150/85.  The patient  reports that values are consistently less than 135 systolic and 90 diastolic  at home.  Heart rate 65 and regular.  NECK:  No jugular venous distention.  Normal carotid upstrokes without  bruits.  LUNGS:  Clear.  CARDIAC:  Normal first and second heart sounds; fourth heart sound present.  ABDOMEN:  Soft and nontender.  No organomegaly.  EXTREMITIES:  Edema 1+ with tenderness.   IMPRESSION:  [name] is doing generally well.  Blood pressure is usually  slightly elevated in the office but apparently normal at other times.  Due  to pedal edema, we will increase her dose of Chlorthalidone to 25 mg q.d.  and check a metabolic profile in one month.  Her Ramipril will be changed to  benazepril 40 mg q.d.  She can substitute over-the-counter Prilosec for  Prevacid.  Carvedilol is available at [facility] on their low-price program.  Cardizem can be obtained generically.   She is a borderline candidate for biventricular pacing or defibrillator.  Due to her excellent symptomatic status and lack of  progression over the  last five years, these procedures will be deferred.  I will see her again in  nine months.    Sincerely,      Gerrit Friends. Dietrich Pates, MD, [facility]    RMR/MedQ  /  Job #:  [id number]  DD:  04/20/2006 / DT:  04/22/2006

## 2010-12-02 NOTE — Procedures (Signed)
[facility]  Patient:    [name], [name] Visit Number: [id number] MRN: [id number]          Service Type: Attending:  Kari Baars, M.D. Dictated by:   Kari Baars, M.D. Proc. Date: 04/29/01                                Stress Test  EXERCISE STRESS TEST  INDICATIONS:  [name] has been having chest discomfort and is undergoing graded exercise testing to rule out ischemic cardiac disease.  DESCRIPTION OF PROCEDURE:  [name] came for a regular stress test.  When she arrived, she was found to have left bundle branch block on a resting EKG, and was rescheduled for a Cardiolite study which was done.  She exercised for five minutes on the Bruce protocol reaching and sustaining 7.0 mets.  Cardiolite was injected per protocol at 4 minutes.  Her maximum recorded heart rate was 146 which was 91% of her predicted maximal heart rate. She had multiple PACs before, during and after exercise.  Her blood pressure response to exercise was normal.  She stopped exercise due to fatigue.  She did not have any chest discomfort during exercise and with the left bundle branch there were no electrocardiographic changes that could definitely determined to be inducible ischemia.  IMPRESSION: 1. Fair exercise tolerance. 2. Left bundle branch block with premature atrial contractions during,    before and after exercise but no evidence of ischemia. 3. Exercise stopped due to fatigue. 4. Normal blood pressure response to exercise. 5. Cardiolite images are pending. Dictated by:   Kari Baars, M.D. Attending:  Kari Baars, M.D. DD:  04/29/01 TD:  04/29/01 Job: [id number]

## 2010-12-02 NOTE — H&P (Signed)
[name], [name]                 ACCOUNT NO.:  [id number]   MEDICAL RECORD NO.:  [id number]          PATIENT TYPE:  INP   LOCATION:  3729                         FACILITY:  [facility]   PHYSICIAN:  Pe[place] Bing, M.D. [facility] OF BIRTH:  [birth year]   DATE OF ADMISSION:  02/22/2005  DATE OF DISCHARGE:                                HISTORY & PHYSICAL   REFERRING PHYSICIAN:  Dr. Juanetta Gosling, who is her primary care physician.   PRIMARY CARDIOLOGIST:  Dr. Dietrich Pates.   HISTORY OF PRESENT ILLNESS:  A 70 year old woman who presented with chest  pressure and dyspnea approximately four years ago. Initial evaluation  revealed no evidence of myocardial ischemia but left ventricular systolic  impairment with an estimated ejection fraction between 0.35 and 0.40.  Cardiac catheterization verified normal coronary angiography. A chronic left  bundle branch block has been present. The patient has not had pulmonary  edema and has done very well with medical therapy.   She notes occasional mild episodes of orthostatic lightheadedness. This  afternoon, after riding in her family vehicle as a passenger for  approximately 1/2 hour, she began to note spots before her eyes as the  vehicle approached her house. Subsequently, she had blackening of her  vision. She hoped to get inside and lay down, but as she stood up outside  the truck, she became limp and was helped to the ground by her husband.  There was no definite loss of consciousness. EMS was summoned who found her  systolic blood pressure to be 90. She was transported to [facility]  where initial EKG, chemistry profile, CBC, and cardiac markers were  unremarkable. She was advised to be transported to [facility] for further assessment.   Past medical history is otherwise notable for hypertension. 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 She has had a partial hysterectomy and a  left ureteral repair as  well as some sort of jaw surgery. She reports no allergies.   RECENT MEDICATIONS:  1.  Coreg 25 milligrams b.i.d.  2.  Ramipril 10 milligrams b.i.d.  3.  Levothyroxine 0.125 milligrams q.d.  4.  Nexium 40 milligrams q.d.  5.  Allegra 1 q.d.  6.  Chlorthalidone 12.5 milligrams q.d.  7.  Multivitamins.   SOCIAL HISTORY:  Married and lives in [place] with her husband;  nonsmoker; no significant use of alcohol. Previously employed at [employer].   FAMILY HISTORY:  Positive for atrial fibrillation, CVA in her mother at age  58, and a maternal grandfather who suffered a fatal myocardial infarction in  his 8s.   REVIEW OF SYSTEMS:  Intermittent headaches. The patient reports fullness in  her head most recently without a specific diagnosis when seen by her primary  care doctor. Remote history of peptic ulcer disease. Occasional mild edema  in the afternoon that resolves by morning. Other systems reviewed and are  negative.   PHYSICAL EXAMINATION:  GENERAL:  Pleasant, well-nourished woman in no acute  distress.  VITAL SIGNS:  The temperature is 98.6, heart  rate 75 and regular,  respirations 18, blood pressure 150/90, weight 213 pounds, O2 saturation 99%  on room air.  HEENT: Anicteric sclerae; normal lids and conjunctiva; pupils equal, round,  react to light; normal EOMs.  NECK: No jugular venous distension; normal carotid upstrokes without bruits.  Transverse thyroidectomy scar at the base of the neck.  LUNGS: Clear.  CARDIAC: Normal first, second heart sounds; fourth heart sound present;  normal PMI.  ABDOMEN: Soft and nontender; normal bowel sounds; no organomegaly; aortic  pulsation not palpable.  EXTREMITIES: Distal pulses intact; no edema.  NEUROMUSCULAR: Normal cranial nerves; normal strength and tone.  MUSCULOSKELETAL: No joint deformities.  SKIN: No significant lesions.   EKG: Normal sinus rhythm; first-degree AV block; left bundle  branch block.   IMPRESSION:  [name] presents with an episode of near syncope or perhaps  brief syncope after some minutes of lightheadedness. She did note some  palpitations at that time as well. The possibility of an arrhythmia must be  entertained, particularly in light of her known cardiomyopathy. She requires  observation. Orthostatic blood pressures will be checked. We will rule out a  gastrointestinal bleed a repeat CBC and stool for Hemoccult testing. Thyroid  status will be assessed. An echocardiogram will be repeated. If she has an  ejection fraction below 35%, she will need to be considered for an automatic  implantable cardioverter/defibrillator. If no arrhythmias found, she can be  discharged in 24 hours with plans for an event recorder over the next 30  days.      [place] Bing, M.D. [facility]  Electronically Signed     RR/MEDQ  D:  02/22/2005  T:  02/23/2005  Job:  [id number]

## 2010-12-02 NOTE — Cardiovascular Report (Signed)
[place]  Patient:    [name], [name] Visit Number: [id number] MRN: [id number]          Service Type: CAT Location: 3700 [id number] Attending Physician:  Daisey Must Dictated by:   Daisey Must, M.D. [facility] Proc. Date: 05/15/01 Admit Date:  05/15/2001   CC:         Kari Baars, M.D.             Dietrich Pates, M.D. [facility]             Cardiac Catheterization Laboratory                        Cardiac Catheterization  PROCEDURES PERFORMED: Right and left heart catheterization with coronary angiography and left ventriculography.  INDICATIONS: The patient is a 70 year old woman with symptoms of chest pressure and dyspnea. A recent echocardiogram showed decreased LV function with an ejection fraction of approximately 35%. A Cardiolite scan also showed ejection fraction of 39% with possible anterior septal scar but no ischemia. Because of her decrease in LV function she was referred for cardiac catheterization to rule out ischemic heart disease.  DESCRIPTION OF PROCEDURE: An 8 French sheath was placed in the right femoral vein, 6 French sheath in the right femoral artery. Catheters utilizing included a Standard Swan-Ganz catheter for the right heart catheterization. Left heart catheterization was performed with a 6Judkins 6 [place] Fahrenheit JL4, JR4, AR2 and angled pigtail. 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 It could not be selectively injected; however, flush injections did show a patency of the right coronary artery. There were no complications.  RESULTS:  HEMODYNAMICS: Mean right atrial pressure 4, right ventricular pressure 32/5, pulmonary artery pressure 28/14. Pulmonary capillary wedge pressure, A 20, V 13, mean 14. Left ventricular pressure 150/16.  Aortic pressure 158/82.  There was no aortic valve gradient.  There was also no gradient between the pulmonary capillary wedge pressure and the left  ventricular end-diastolic pressure.  Cardiac output by the thermodilution method is 5.3, cardiac index 2.6. Cardiac output by the Fick method is 3.4, cardiac index 1.7.  LEFT VENTRICULOGRAM: There is severe global hypokinesis of the left ventricle with apical dyskinesis. There is significant left ventricular dilatation. Ejection fraction is calculated at 37%. There is no mitral regurgitation.  CORONARY ARTERIOGRAPHY: (Right dominant).  Left main: Normal.  Left anterior descending: The left anterior descending artery gives rise to a normal sized first diagonal, a small second and third diagonal branches. The LAD is free of angiographic disease.  Left circumflex: The left circumflex gives rise to a small OM-1, small OM-2 and a normal sized OM-3. The left circumflex is free of angiographic disease.  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  Due to its anomalous location it could not be selectively engaged with a catheter. However, flush injections did adequately opacify the right coronary artery and demonstrated to be free of angiographic disease. The distal right coronary artery gives rise to a normal sized posterior descending artery and two small posterolateral branches.  IMPRESSIONS: 1. Normal right and left heart filling pressures with high normal pulmonary    artery pressure. 2. Moderate to severe decrease in left ventricular systolic function secondary    to nonischemic cardiomyopathy. 3. Angiographically normal coronary arteries.  PLAN: The patient will be treated medically. Dictated by:   Daisey Must, M.D. [facility] Attending Physician:  Daisey Must  DD:  05/15/01 TD:  05/16/01 Job: [id number]

## 2010-12-19 ENCOUNTER — Ambulatory Visit (INDEPENDENT_AMBULATORY_CARE_PROVIDER_SITE_OTHER): Payer: Medicare Other | Admitting: Gastroenterology

## 2010-12-19 ENCOUNTER — Encounter: Payer: Self-pay | Admitting: Gastroenterology

## 2010-12-19 VITALS — BP 112/69 | HR 63 | Temp 98.0°F | Ht 67.0 in | Wt 218.4 lb

## 2010-12-19 DIAGNOSIS — K219 Gastro-esophageal reflux disease without esophagitis: Secondary | ICD-10-CM

## 2010-12-19 DIAGNOSIS — R110 Nausea: Secondary | ICD-10-CM

## 2010-12-19 MED ORDER — DEXLANSOPRAZOLE 60 MG PO CPDR: 60.0000 mg | DELAYED_RELEASE_CAPSULE | Freq: Every day | ORAL | Status: DC

## 2010-12-19 NOTE — Assessment & Plan Note (Signed)
70 year old female with hx of chronic GERD, maintained on Prevacid in past but noticed exacerbation with OTC forms. Switched to [employer] in February, now has total symptom resolution. No dysphagia, odynophagia, or nausea. No abdominal pain. No need for further intervention. Will continue Dexilant daily. Consider EGD if symptoms return. Pt aware. We will see her back in 1 year. Dexilant refills provided.

## 2010-12-19 NOTE — Patient Instructions (Signed)
Continue Dexilant.  Contact our office if nausea returns or if you have any abdominal pain, increased reflux, difficulty swallowing.  We will see you back in 1 year or sooner if needed.

## 2010-12-19 NOTE — Progress Notes (Signed)
Referring Provider: No ref. provider found Primary Care Physician:  Fredirick Maudlin, MD Primary Gastroenterologist: Dr. Jena Gauss   Chief Complaint  Patient presents with  . Follow-up    doing good    HPI:   [name] is a pleasant 70 year old caucasian female who presents in f/u for intermittent nausea and GERD. At last visit, she was started on Dexilant. She has hx of chronic GERD, and she noted that nausea was related to bouts of sinus infections. She had been taking Prevacid, but this stopped relieving her symptoms once changed to generic form.  She presents today having been on Dexilant since Feb 2012. Nausea has completely resolved. No reflux. Denies dysphagia/odynophagia. Denies abdominal pain, lack of appetite, or wt loss. Occasional constipation, but this does not bother her. No melena or brbpr.   Past Medical History  Diagnosis Date  . Hyperlipemia   . HTN (hypertension)   . DJD (degenerative joint disease)   . Hypothyroid   . Cardiomyopathy, nonischemic     w/L BBB Ejection Fraction of 35% in 08  . GERD (gastroesophageal reflux disease)   . S/P colonoscopy 2009    Dr. Jarold Motto: reportedly normal per pt  . S/P endoscopy 1980s    per pt; normal. performed secondary to reflux    Past Surgical History  Procedure Date  . Thyroidectomy   . Cholecystectomy   . Appendectomy   . Abdominal hysterectomy   . Breast biopsy     bilaterally(begin)  . Mandible fracture surgery     secondary to underbite  . Ureteral surgery 1970    Current Outpatient Prescriptions  Medication Sig Dispense Refill  . aspirin 325 MG tablet Take 325 mg by mouth daily.        . benazepril (LOTENSIN) 40 MG tablet Take 40 mg by mouth daily.        . carvedilol (COREG) 25 MG tablet Take 25 mg by mouth 2 (two) times daily with a meal.        . chlorthalidone (HYGROTON) 25 MG tablet Take 25 mg by mouth daily.        . Cholecalciferol (VITAMIN D) 400 UNITS capsule Take 400 Units by mouth daily.         Marland Kitchen dexlansoprazole (DEXILANT) 60 MG capsule Take 1 capsule (60 mg total) by mouth daily.  31 capsule  11  . diltiazem (CARDIZEM CD) 180 MG 24 hr capsule Take 180 mg by mouth daily.        . fexofenadine (ALLEGRA) 180 MG tablet Take 180 mg by mouth daily.        . fluticasone (FLONASE) 50 MCG/ACT nasal spray Place 2 sprays into the nose daily.        . lansoprazole (PREVACID) 30 MG capsule Take 30 mg by mouth daily.        Marland Kitchen levothyroxine (SYNTHROID, LEVOTHROID) 100 MCG tablet Take 100 mcg by mouth daily.        . Multiple Vitamins-Minerals (CENTRUM SILVER PO) Take by mouth.        . traMADol (ULTRAM) 50 MG tablet Take 50 mg by mouth every 6 (six) hours as needed.                Allergies as of 12/19/2010  . (No Known Allergies)     History   Social History  . Marital Status: Married    Spouse Name: N/A    Number of Children: N/A  . Years of Education: N/A   Social  History Main Topics  . Smoking status: Never Smoker   . Smokeless tobacco: None  . Alcohol Use: No  . Drug Use: No     Review of Systems: Gen: Denies fever, chills, anorexia. Denies fatigue, weakness, weight loss.  CV: Denies chest pain, palpitations, syncope, peripheral edema, and claudication. Resp: Denies dyspnea at rest, cough, wheezing, coughing up blood, and pleurisy. GI: Denies vomiting blood, jaundice, and fecal incontinence.   Denies dysphagia or odynophagia. Derm: Denies rash, itching, dry skin Psych: Denies depression, anxiety, memory loss, confusion. No homicidal or suicidal ideation.  Heme: Denies bruising, bleeding, and enlarged lymph nodes.  Physical Exam: BP 112/69  Pulse 63  Temp(Src) 98 F (36.7 C) (Temporal)  Ht 5\' 7"  (1.702 m)  Wt 218 lb 6.4 oz (99.066 kg)  BMI 34.21 kg/m2 General:   Alert and oriented. No distress noted. Pleasant and cooperative.  Head:  Normocephalic and atraumatic. Eyes:  Conjuctiva clear without scleral icterus. Mouth:  Oral mucosa pink and moist. Good dentition.  No lesions. Neck:  Supple, without mass or thyromegaly. Heart:  S1, S2 present without murmurs, rubs, or gallops. Regular rate and rhythm. Abdomen:  +BS, soft, non-tender and non-distended. No rebound or guarding. No HSM or masses noted. Msk:  Symmetrical without gross deformities. Normal posture. Extremities:  Without edema. Neurologic:  Alert and  oriented x4;  grossly normal neurologically. Skin:  Intact without significant lesions or rashes. Cervical Nodes:  No significant cervical adenopathy. Psych:  Alert and cooperative. Normal mood and affect.

## 2010-12-19 NOTE — Assessment & Plan Note (Signed)
Resolved

## 2010-12-20 NOTE — Progress Notes (Signed)
Cc to PCP 

## 2010-12-26 ENCOUNTER — Other Ambulatory Visit: Payer: Self-pay | Admitting: Cardiology

## 2011-01-20 ENCOUNTER — Other Ambulatory Visit: Payer: Self-pay | Admitting: Cardiology

## 2011-05-29 ENCOUNTER — Encounter: Payer: Self-pay | Admitting: Cardiology

## 2011-05-31 ENCOUNTER — Encounter: Payer: Self-pay | Admitting: Cardiology

## 2011-05-31 ENCOUNTER — Ambulatory Visit (INDEPENDENT_AMBULATORY_CARE_PROVIDER_SITE_OTHER): Payer: Medicare Other | Admitting: Cardiology

## 2011-05-31 VITALS — BP 121/66 | HR 69 | Ht 67.5 in | Wt 209.0 lb

## 2011-05-31 DIAGNOSIS — R0609 Other forms of dyspnea: Secondary | ICD-10-CM

## 2011-05-31 DIAGNOSIS — I10 Essential (primary) hypertension: Secondary | ICD-10-CM

## 2011-05-31 DIAGNOSIS — E785 Hyperlipidemia, unspecified: Secondary | ICD-10-CM

## 2011-05-31 DIAGNOSIS — I428 Other cardiomyopathies: Secondary | ICD-10-CM

## 2011-05-31 DIAGNOSIS — M1990 Unspecified osteoarthritis, unspecified site: Secondary | ICD-10-CM

## 2011-05-31 DIAGNOSIS — R0600 Dyspnea, unspecified: Secondary | ICD-10-CM

## 2011-05-31 DIAGNOSIS — E039 Hypothyroidism, unspecified: Secondary | ICD-10-CM

## 2011-05-31 NOTE — Assessment & Plan Note (Signed)
Blood pressure control is good; current medications will be continued. 

## 2011-05-31 NOTE — Assessment & Plan Note (Signed)
Lipid profile is suboptimal, but therapy is not mandated in the absence of known vascular disease.  Moreover, patient has had adverse reactions to statins in the past.  No pharmacologic therapy will be utilized.

## 2011-05-31 NOTE — Progress Notes (Signed)
HPI: Ms. Dethlefs returns to the office as scheduled for continued assessment and treatment of long-standing cardiomyopathy.  Left ventricular systolic function was most recently assessed in 2008 at which time ejection fraction was estimated to be 40%.  The patient continues to do well, but notes some decline in exercise tolerance with an increase in dyspnea on exertion.  Threshold is variable.  On some days, she can walk fairly far, but on others she rapidly develops dyspnea forcing her to rest.  She has no orthopnea nor PND, but sleeps on a single firm large pillow.  She has not noted significant pedal edema.  She has had no major medical problems, and has not been seen in the Emergency Department or hospital since her last office visit.  Prior to Admission medications   Medication Sig Start Date End Date Taking? Authorizing Provider  aspirin 325 MG tablet Take 325 mg by mouth daily.     Yes Historical Provider, MD  benazepril (LOTENSIN) 40 MG tablet TAKE ONE TABLET BY MOUTH EVERY DAY 12/26/10  Yes Gerrit Friends. Justa Hatchell, MD  CARDIZEM CD 180 MG 24 hr capsule TAKE ONE CAPSULE DAILY. 01/20/11  Yes Gerrit Friends. Aijalon Demuro, MD  carvedilol (COREG) 25 MG tablet Take 25 mg by mouth 2 (two) times daily with a meal.     Yes Historical Provider, MD  chlorthalidone (HYGROTON) 25 MG tablet Take 25 mg by mouth daily.     Yes Historical Provider, MD  Cholecalciferol (VITAMIN D) 400 UNITS capsule Take 400 Units by mouth daily.     Yes Historical Provider, MD  dexlansoprazole (DEXILANT) 60 MG capsule Take 1 capsule (60 mg total) by mouth daily. 12/19/10  Yes Gerrit Halls, NP  fexofenadine (ALLEGRA) 180 MG tablet Take 180 mg by mouth daily.     Yes Historical Provider, MD  fluticasone (FLONASE) 50 MCG/ACT nasal spray Place 2 sprays into the nose daily.     Yes Historical Provider, MD  lansoprazole (PREVACID) 30 MG capsule Take 30 mg by mouth daily.     Yes Historical Provider, MD  levothyroxine (SYNTHROID, LEVOTHROID) 100 MCG tablet  Take 100 mcg by mouth daily.     Yes Historical Provider, MD  Multiple Vitamins-Minerals (CENTRUM SILVER PO) Take by mouth.     Yes Historical Provider, MD  traMADol (ULTRAM) 50 MG tablet Take 50 mg by mouth every 6 (six) hours as needed.     Yes Historical Provider, MD    No Known Allergies    Past medical history, social history, and family history reviewed and updated.  ROS: Denies palpitations, lightheadedness or syncope.  She has recently had a productive cough associated with a upper respiratory infection, but was free of respiratory symptoms prior to that.  There is no history of lung disease or exposures to potential pulmonary toxins.  PHYSICAL EXAM: BP 121/66  Pulse 69  Ht 5' 7.5" (1.715 m)  Wt 94.802 kg (209 lb)  BMI 32.25 kg/m2  SpO2 96%   General-Well developed; no acute distress Body habitus-overweight Neck-No JVD; no carotid bruits Lungs-clear lung fields; resonant to percussion Cardiovascular-normal PMI; normal S1 and S2; modest systolic murmur; pulse irregularity. Abdomen-normal bowel sounds; soft and non-tender without masses or organomegaly Musculoskeletal-No deformities, no cyanosis or clubbing Neurologic-Normal cranial nerves; symmetric strength and tone Skin-Warm, no significant lesions Extremities-distal pulses intact; no edema  Rhythm Strip: Normal sinus rhythm with first degree AV block, IVCD and fairly frequent PVCs.  ASSESSMENT AND PLAN:  Bessemer Bing, MD 05/31/2011 1:36 PM

## 2011-05-31 NOTE — Assessment & Plan Note (Signed)
Euthyroid when last assessed earlier this year.

## 2011-05-31 NOTE — Patient Instructions (Signed)
Your physician recommends that you schedule a follow-up appointment in: 1 year  Your physician recommends that you return for lab work in: Today (CMET, CBC, BNP)  A chest x-ray takes a picture of the organs and structures inside the chest, including the heart, lungs, and blood vessels. This test can show several things, including, whether the heart is enlarges; whether fluid is building up in the lungs; and whether pacemaker / defibrillator leads are still in place.  Your physician has requested that you have an echocardiogram. Echocardiography is a painless test that uses sound waves to create images of your heart. It provides your doctor with information about the size and shape of your heart and how well your heart's chambers and valves are working. This procedure takes approximately one hour. There are no restrictions for this procedure.

## 2011-05-31 NOTE — Assessment & Plan Note (Addendum)
Symptoms have progressed modestly.  This is a propitious time to reassess control of congestive heart failure.  A chest x-ray, BNP level and echocardiogram will be obtained in addition to routine laboratory studies.  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  Therapy will not be changed and a return visit will be planned for one year hence.

## 2011-06-01 ENCOUNTER — Telehealth: Payer: Self-pay | Admitting: *Deleted

## 2011-06-01 LAB — CBC
MCHC: 32.9 g/dL (ref 30.0–36.0)
Platelets: 273 10*3/uL (ref 150–400)
RDW: 13.6 % (ref 11.5–15.5)

## 2011-06-01 LAB — COMPREHENSIVE METABOLIC PANEL
ALT: 12 U/L (ref 0–35)
AST: 18 U/L (ref 0–37)
Albumin: 4.2 g/dL (ref 3.5–5.2)
Calcium: 9.3 mg/dL (ref 8.4–10.5)
Chloride: 97 mEq/L (ref 96–112)
Potassium: 4.1 mEq/L (ref 3.5–5.3)

## 2011-06-01 LAB — BRAIN NATRIURETIC PEPTIDE: Brain Natriuretic Peptide: 31.6 pg/mL (ref 0.0–100.0)

## 2011-06-01 NOTE — Telephone Encounter (Signed)
Lab results called to patient

## 2011-06-05 ENCOUNTER — Ambulatory Visit (HOSPITAL_COMMUNITY)
Admission: RE | Admit: 2011-06-05 | Discharge: 2011-06-05 | Disposition: A | Discharge status: Home / Self Care | Payer: Medicare Other | Source: Ambulatory Visit | Attending: Cardiology | Admitting: Cardiology

## 2011-06-05 ENCOUNTER — Telehealth: Payer: Self-pay | Admitting: *Deleted

## 2011-06-05 DIAGNOSIS — I447 Left bundle-branch block, unspecified: Secondary | ICD-10-CM | POA: Insufficient documentation

## 2011-06-05 DIAGNOSIS — I428 Other cardiomyopathies: Secondary | ICD-10-CM

## 2011-06-05 DIAGNOSIS — I059 Rheumatic mitral valve disease, unspecified: Secondary | ICD-10-CM

## 2011-06-05 DIAGNOSIS — I2589 Other forms of chronic ischemic heart disease: Secondary | ICD-10-CM | POA: Insufficient documentation

## 2011-06-05 DIAGNOSIS — I10 Essential (primary) hypertension: Secondary | ICD-10-CM

## 2011-06-05 NOTE — Telephone Encounter (Signed)
Normal chest x ray results called to patient.  Message left.

## 2011-06-05 NOTE — Progress Notes (Signed)
*  PRELIMINARY RESULTS* Echocardiogram 2D Echocardiogram has been performed.  Conrad [place] 06/05/2011, 10:06 AM

## 2011-06-13 ENCOUNTER — Telehealth: Payer: Self-pay | Admitting: *Deleted

## 2011-06-13 NOTE — Telephone Encounter (Signed)
Echo results called to patient. 

## 2011-06-19 ENCOUNTER — Telehealth: Payer: Self-pay | Admitting: Internal Medicine

## 2011-06-19 NOTE — Telephone Encounter (Signed)
May offer f/u office visit. If having chest pain, needs to seek medical attention.

## 2011-06-19 NOTE — Telephone Encounter (Signed)
Please call patient back at [phone]. She is having pain in mid section under her breast and wants to know what we advise her to do.

## 2011-06-19 NOTE — Telephone Encounter (Signed)
Tried to call pt back.  LMOM 

## 2011-06-19 NOTE — Telephone Encounter (Signed)
Pt was returning JL's call. I told patient that the nurse was unavailable, but I would have her call her back in about 10-15 minutes

## 2011-06-20 ENCOUNTER — Telehealth: Payer: Self-pay | Admitting: Cardiology

## 2011-06-20 NOTE — Telephone Encounter (Signed)
PT STATES SHE HAD A ECHO DONE 06/05/11 AND HAS NOT GOT RESULTS YET.

## 2011-06-20 NOTE — Telephone Encounter (Signed)
Pt is aware of OV on 12/6 @ 2pm with AS

## 2011-06-20 NOTE — Telephone Encounter (Signed)
Patient was notified of Echo results on 11/27 and again today.

## 2011-06-22 ENCOUNTER — Ambulatory Visit (INDEPENDENT_AMBULATORY_CARE_PROVIDER_SITE_OTHER): Payer: Medicare Other | Admitting: Gastroenterology

## 2011-06-22 ENCOUNTER — Encounter: Payer: Self-pay | Admitting: Gastroenterology

## 2011-06-22 VITALS — BP 111/68 | HR 67 | Temp 98.2°F | Ht 67.0 in | Wt 221.6 lb

## 2011-06-22 DIAGNOSIS — R1013 Epigastric pain: Secondary | ICD-10-CM

## 2011-06-22 NOTE — Patient Instructions (Addendum)
We have set you up for an upper endoscopy with Dr. Jena Gauss in the near future.  Continue taking Dexilant daily.  You may take 1 capful of Miralax daily or every other day.

## 2011-06-22 NOTE — Progress Notes (Signed)
Referring Provider: Fredirick Maudlin, MD Primary Care Physician:  Fredirick Maudlin, MD Primary Gastroenterologist: Dr. Jena Gauss   Chief Complaint  Patient presents with  . Abdominal Pain    HPI:   Ms. Mcilrath is a very pleasant 70 year old female who was last seen in June 2012 by myself. At that time, she was doing well on Dexilant. She has a history of chronic GERD and has dealt with intermittent nausea in the past. She was started on Dexilant back in Feb 2012.  She called our office last week complaining of epigastric pain, so she returns today for follow-up. She no longer has her gallbladder. Reports intermittent epigastric pain, sharp, unsure will happen. Denies nausea. Reports decreased appetite and early satiety. Pain X 2 weeks. No melena noted. Denies NSAIDs or aspirin powders. Last EGD in the 1980s.  Intermittent constipation noted, 3-4 days between BMs. Takes dulcolax prn. Hasn't tried Miralax.    Nov 2012:  CBC normal.  LFTs normal.     Past Medical History  Diagnosis Date  . Hyperlipemia   . Hypertension   . Degenerative joint disease     Right hip and knee  . Hypothyroid   . 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 GERD (gastroesophageal reflux disease)   . S/P colonoscopy 2009    Dr. Jarold Motto: reportedly normal per pt  . S/P endoscopy 1980s    per pt; normal. performed secondary to reflux  . Cerebrovascular accident     No clinical event; asymptomatic CT finding    Past Surgical History  Procedure Date  . Thyroidectomy     neoplasm  . Cholecystectomy   . Appendectomy   . Abdominal hysterectomy   . Breast biopsy     bilaterally; benign  . Mandible surgery     secondary to malocclusion  . Ureteral surgery 1970  . Colonoscopy 2009    Current Outpatient Prescriptions  Medication Sig Dispense Refill  . aspirin 325 MG tablet Take 325 mg by mouth daily.         . benazepril (LOTENSIN) 40 MG tablet TAKE ONE TABLET BY MOUTH EVERY DAY  30 tablet  5  . CARDIZEM CD 180 MG 24 hr capsule TAKE ONE CAPSULE DAILY.  30 each  4  . carvedilol (COREG) 25 MG tablet Take 25 mg by mouth 2 (two) times daily with a meal.        . chlorthalidone (HYGROTON) 25 MG tablet Take 25 mg by mouth daily.        . Cholecalciferol (VITAMIN D) 400 UNITS capsule Take 400 Units by mouth daily.        Marland Kitchen dexlansoprazole (DEXILANT) 60 MG capsule Take 1 capsule (60 mg total) by mouth daily.  31 capsule  11  . fexofenadine (ALLEGRA) 180 MG tablet Take 180 mg by mouth daily.        . fluticasone (FLONASE) 50 MCG/ACT nasal spray Place 2 sprays into the nose daily.        . lansoprazole (PREVACID) 30 MG capsule Take 30 mg by mouth daily.        Marland Kitchen levothyroxine (SYNTHROID, LEVOTHROID) 100 MCG tablet Take 100 mcg by mouth daily.        . Multiple Vitamins-Minerals (CENTRUM SILVER PO) Take by mouth.        . traMADol (ULTRAM) 50 MG tablet Take 50 mg by mouth every 6 (six) hours  as needed.          Allergies as of 06/22/2011  . (No Known Allergies)    Family History  Problem Relation Age of Onset  . Brain cancer Mother   . Atrial fibrillation Other   . Colon cancer Neg Hx     History   Social History  . Marital Status: Married    Spouse Name: N/A    Number of Children: 2  . Years of Education: N/A   Social History Main Topics  . Smoking status: Never Smoker   . Smokeless tobacco: Never Used  . Alcohol Use: No  . Drug Use: No  . Sexually Active: None   Other Topics Concern  . None   Social History Narrative   Widowed    Review of Systems: Gen: SEE HPI  CV: Denies chest pain, palpitations, syncope, peripheral edema, and claudication. Resp: Denies dyspnea at rest, cough, wheezing, coughing up blood, and pleurisy. GI: SEE HPI Derm: Denies rash, itching, dry skin Psych: Denies depression, anxiety, memory loss, confusion. No homicidal or suicidal ideation.  Heme:  Denies bruising, bleeding, and enlarged lymph nodes.  Physical Exam: BP 111/68  Pulse 67  Temp(Src) 98.2 F (36.8 C) (Temporal)  Ht 5\' 7"  (1.702 m)  Wt 221 lb 9.6 oz (100.517 kg)  BMI 34.71 kg/m2 General:   Alert and oriented. No distress noted. Pleasant and cooperative.  Head:  Normocephalic and atraumatic. Eyes:  Conjuctiva clear without scleral icterus. Mouth:  Oral mucosa pink and moist. Good dentition. No lesions. Neck:  Supple, without mass or thyromegaly. Heart:  S1, S2 present without murmurs, rubs, or gallops. Regular rate and rhythm. Abdomen:  +BS, soft, TTP epigastric region, non-distended. No rebound or guarding. No HSM or masses noted. Msk:  Symmetrical without gross deformities. Normal posture. Extremities:  Without edema. Neurologic:  Alert and  oriented x4;  grossly normal neurologically. Skin:  Intact without significant lesions or rashes. Cervical Nodes:  No significant cervical adenopathy. Psych:  Alert and cooperative. Normal mood and affect.

## 2011-06-23 ENCOUNTER — Other Ambulatory Visit: Payer: Self-pay | Admitting: Cardiology

## 2011-06-24 DIAGNOSIS — R1013 Epigastric pain: Secondary | ICD-10-CM | POA: Insufficient documentation

## 2011-06-24 NOTE — Assessment & Plan Note (Signed)
70 year old pleasant female with hx of chronic GERD, now with new-onset dyspepsia. No wt loss, N/V. Does have decreased appetite. Has been maintained on Dexilant in the past and done well with this. Recent CBC and LFTs are normal. Denies NSAIDs or aspirin powder. Last EGD in remote past, around 1980s. Will need updated EGD.  Continue Dexilant Proceed with upper endoscopy in the near future with Dr. Jena Gauss. The risks, benefits, and alternatives have been discussed in detail with patient. They have stated understanding and desire to proceed.

## 2011-06-27 NOTE — Progress Notes (Signed)
Cc to PCP 

## 2011-07-06 ENCOUNTER — Encounter (HOSPITAL_COMMUNITY): Payer: Self-pay | Admitting: Pharmacy Technician

## 2011-07-13 ENCOUNTER — Other Ambulatory Visit: Payer: Self-pay | Admitting: Internal Medicine

## 2011-07-13 ENCOUNTER — Ambulatory Visit (HOSPITAL_COMMUNITY)
Admission: RE | Admit: 2011-07-13 | Discharge: 2011-07-13 | Disposition: A | Discharge status: Home / Self Care | Payer: Medicare Other | Source: Ambulatory Visit | Attending: Internal Medicine | Admitting: Internal Medicine

## 2011-07-13 ENCOUNTER — Encounter (HOSPITAL_COMMUNITY)
Admission: RE | Disposition: A | Discharge status: Home / Self Care | Payer: Self-pay | Source: Ambulatory Visit | Attending: Internal Medicine

## 2011-07-13 ENCOUNTER — Encounter (HOSPITAL_COMMUNITY): Payer: Self-pay | Admitting: *Deleted

## 2011-07-13 DIAGNOSIS — R933 Abnormal findings on diagnostic imaging of other parts of digestive tract: Secondary | ICD-10-CM

## 2011-07-13 DIAGNOSIS — K449 Diaphragmatic hernia without obstruction or gangrene: Secondary | ICD-10-CM

## 2011-07-13 DIAGNOSIS — K2940 Chronic atrophic gastritis without bleeding: Secondary | ICD-10-CM | POA: Insufficient documentation

## 2011-07-13 DIAGNOSIS — Z79899 Other long term (current) drug therapy: Secondary | ICD-10-CM | POA: Insufficient documentation

## 2011-07-13 DIAGNOSIS — R1013 Epigastric pain: Secondary | ICD-10-CM

## 2011-07-13 DIAGNOSIS — E785 Hyperlipidemia, unspecified: Secondary | ICD-10-CM | POA: Insufficient documentation

## 2011-07-13 DIAGNOSIS — I10 Essential (primary) hypertension: Secondary | ICD-10-CM | POA: Insufficient documentation

## 2011-07-13 HISTORY — PX: ESOPHAGOGASTRODUODENOSCOPY: SHX5428

## 2011-07-13 SURGERY — EGD (ESOPHAGOGASTRODUODENOSCOPY)
Anesthesia: Moderate Sedation

## 2011-07-13 MED ORDER — SODIUM CHLORIDE 0.45 % IV SOLN
Freq: Once | INTRAVENOUS | Status: AC
  Administered 2011-07-13: 10:00:00 via INTRAVENOUS

## 2011-07-13 MED ORDER — MEPERIDINE HCL 100 MG/ML IJ SOLN
INTRAMUSCULAR | Status: AC
  Filled 2011-07-13: qty 1

## 2011-07-13 MED ORDER — MIDAZOLAM HCL 5 MG/5ML IJ SOLN
INTRAMUSCULAR | Status: DC | PRN
  Administered 2011-07-13: 2 mg via INTRAVENOUS
  Administered 2011-07-13: 1 mg via INTRAVENOUS

## 2011-07-13 MED ORDER — BUTAMBEN-TETRACAINE-BENZOCAINE 2-2-14 % EX AERO
INHALATION_SPRAY | CUTANEOUS | Status: DC | PRN
  Administered 2011-07-13: 1 via TOPICAL

## 2011-07-13 MED ORDER — MEPERIDINE HCL 100 MG/ML IJ SOLN
INTRAMUSCULAR | Status: DC | PRN
  Administered 2011-07-13: 25 mg via INTRAVENOUS
  Administered 2011-07-13: 50 mg via INTRAVENOUS

## 2011-07-13 MED ORDER — STERILE WATER FOR IRRIGATION IR SOLN
Status: DC | PRN
  Administered 2011-07-13: 10:00:00

## 2011-07-13 MED ORDER — MIDAZOLAM HCL 5 MG/5ML IJ SOLN
INTRAMUSCULAR | Status: AC
  Filled 2011-07-13: qty 10

## 2011-07-13 NOTE — H&P (Signed)
I have seen & examined the patient prior to the procedure(s) today and reviewed the history and physical/consultation.  Patient states with the addition of MiraLax to her regimen of the past week bowel function is gotten better and her abdominal pain has resolved. Otherwise, There have been no changes.  After consideration of the risks, benefits, alternatives and imponderables, the patient has consented to the procedure(s).

## 2011-07-13 NOTE — Op Note (Signed)
[facility] [address] [place], [place]  [address]  ENDOSCOPY PROCEDURE REPORT  PATIENT:  Mary, Walton  MR#:  [id number] BIRTHDATE:  10-Aug-1940, 70 yrs. old  GENDER:  female  ENDOSCOPIST:  R. Roetta Sessions, MD [facility] Referred by:          Dr. Juanetta Gosling  PROCEDURE DATE:  07/13/2011 PROCEDURE:  EGD with gastric biopsy  INDICATIONS:  Recent epigastric pain. Over the past 2 weeks, pain has resolved with the use of MiraLax which has facilitated bowel function  INFORMED CONSENT:   The risks, benefits, limitations, alternatives and imponderables have been discussed.  The potential for biopsy, esophogeal dilation, etc. have also been reviewed.  Questions have been answered.  All parties agreeable.  Please see the history and physical in the medical record for more information.  MEDICATIONS:  Versed 3 mg IV Demerol 75 mg IV in divided doses. Cetacaine spray.  DESCRIPTION OF PROCEDURE:   The EG-[id number] ([id number]) endoscope was introduced through the mouth and advanced to the second portion of the duodenum without difficulty or limitations.  The mucosal surfaces were surveyed very carefully during advancement of the scope and upon withdrawal.  Retroflexion view of the proximal stomach and esophagogastric junction was performed.  <<PROCEDUREIMAGES>>  FINDINGS:  Esophagus normal. Small hiatal hernia. Single tiny fundal gland polyp. Patchy erythema. Scar and deformity of the antral     mucosa. No ulcer infiltrating process seen. First and second portion of duodenum normal  THERAPEUTIC / DIAGNOSTIC MANEUVERS PERFORMED:biopsies of the gastric body and antrum taken for histologic study  COMPLICATIONS:   None  IMPRESSION:  Small hiatal hernia; patchy gastric erythema with scarring and deformity of the antrum suggestive of prior peptic ulcer                                 disease. Status post biopsy - gastric mucosa to rule out HP.  RECOMMENDATIONS:     Continue Dexilant  every day. Utilize MiraLax nightly if no bowel movement on any given day.  ______________________________ R. Roetta Sessions, MD Caleen Essex  CC:  Shaune Pollack, MD  n. eSIGNED:   R. Roetta Sessions at 07/13/2011 10:36 AM  [name], [phone]

## 2011-07-18 ENCOUNTER — Other Ambulatory Visit: Payer: Self-pay | Admitting: Cardiology

## 2011-07-24 ENCOUNTER — Other Ambulatory Visit: Payer: Self-pay | Admitting: Cardiology

## 2011-07-24 ENCOUNTER — Encounter (HOSPITAL_COMMUNITY): Payer: Self-pay | Admitting: Internal Medicine

## 2011-07-24 MED ORDER — CHLORTHALIDONE 25 MG PO TABS: 25.0000 mg | ORAL_TABLET | Freq: Every day | ORAL | Status: DC

## 2011-09-13 ENCOUNTER — Ambulatory Visit: Payer: Medicare Other | Admitting: Gastroenterology

## 2011-09-13 ENCOUNTER — Encounter: Payer: Self-pay | Admitting: Gastroenterology

## 2011-09-13 ENCOUNTER — Ambulatory Visit (INDEPENDENT_AMBULATORY_CARE_PROVIDER_SITE_OTHER): Payer: Medicare Other | Admitting: Gastroenterology

## 2011-09-13 DIAGNOSIS — K219 Gastro-esophageal reflux disease without esophagitis: Secondary | ICD-10-CM | POA: Diagnosis not present

## 2011-09-13 DIAGNOSIS — K5900 Constipation, unspecified: Secondary | ICD-10-CM | POA: Insufficient documentation

## 2011-09-13 DIAGNOSIS — R1013 Epigastric pain: Secondary | ICD-10-CM

## 2011-09-13 DIAGNOSIS — K3189 Other diseases of stomach and duodenum: Secondary | ICD-10-CM | POA: Diagnosis not present

## 2011-09-13 MED ORDER — LINACLOTIDE 145 MCG PO CAPS: 1.0000 | ORAL_CAPSULE | Freq: Every day | ORAL | Status: DC

## 2011-09-13 NOTE — Assessment & Plan Note (Signed)
Discussed numerous options with patient. She wants to try Linzess daily. #30 samples provided. Hold miralax. PR in couple of weeks. If no improvement in constipation or if ifobt positive, then proceed with TCS.

## 2011-09-13 NOTE — Assessment & Plan Note (Signed)
Some improvement. Will likely do better with taking lansoprazole 30mg  bid rather than 60mg  daily. PR in two weeks.

## 2011-09-13 NOTE — Assessment & Plan Note (Signed)
Typical gerd well-controlled.

## 2011-09-13 NOTE — Progress Notes (Signed)
Primary Care Physician: Fredirick Maudlin, MD, MD  Primary Gastroenterologist:  Roetta Sessions, MD   Chief Complaint  Patient presents with  . Follow-up    stomach still hurt    HPI: [name] is a 71 y.o. female here for f/u EGD. Was not able to tolerate Dexilant. Caused lower abdominal pain. Symptoms resolved after stopping medication. Took pills other morning with water and got weak immediately and had to lay down. Happens occasionally. Does not feel weak or faint at other times. Issues with constipation still. Tried Miralax daily but developed diarrhea. Taking qod, only little BM, not good relief. No brbpr. Nausea most days, comes by spells. Comes whenever. Last for few days, then goes away. No vomiting. No burning or pain in stomach. Dulcolax three tablets once weekly. Last TCS around 2009, normal per patient. Husband had colon cancer.   Current Outpatient Prescriptions  Medication Sig Dispense Refill  . aspirin EC 81 MG tablet Take 81 mg by mouth daily.        . benazepril (LOTENSIN) 40 MG tablet TAKE ONE TABLET BY MOUTH EVERY DAY  90 tablet  3  . CARDIZEM CD 180 MG 24 hr capsule TAKE ONE CAPSULE DAILY.  30 each  11  . carvedilol (COREG) 25 MG tablet Take 25 mg by mouth 2 (two) times daily with a meal.        . chlorthalidone (HYGROTON) 25 MG tablet Take 1 tablet (25 mg total) by mouth daily.  90 tablet  3  . Cholecalciferol (VITAMIN D) 400 UNITS capsule Take 400 Units by mouth daily.        . fexofenadine (ALLEGRA) 180 MG tablet Take 180 mg by mouth daily.        . fluticasone (FLONASE) 50 MCG/ACT nasal spray Place 2 sprays into the nose daily as needed.       . lansoprazole (PREVACID) 30 MG capsule Take 30 mg by mouth 2 (two) times daily.      Marland Kitchen levothyroxine (SYNTHROID) 100 MCG tablet Take 100 mcg by mouth daily.        . Multiple Vitamins-Minerals (CENTRUM SILVER PO) Take 1 tablet by mouth daily.       . traMADol (ULTRAM) 50 MG tablet Take 50 mg by mouth every 6 (six) hours as  needed. pain      .         Allergies as of 09/13/2011  . (No Known Allergies)    ROS:  General: Negative for anorexia, weight loss, fever, chills, fatigue, weakness. Weight up 6 pounds since 06/2011 ENT: Negative for hoarseness, difficulty swallowing , nasal congestion. CV: Negative for chest pain, angina, palpitations, dyspnea on exertion, peripheral edema.  Respiratory: Negative for dyspnea at rest, dyspnea on exertion, cough, sputum, wheezing.  GI: See history of present illness. GU:  Negative for dysuria, hematuria, urinary incontinence, urinary frequency, nocturnal urination.  Endo: Negative for unusual weight change.    Physical Examination:   BP 122/69  Pulse 59  Temp(Src) 98.2 F (36.8 C) (Temporal)  Ht 5\' 7"  (1.702 m)  Wt 227 lb 6.4 oz (103.148 kg)  BMI 35.62 kg/m2  General: Well-nourished, well-developed in no acute distress.  Eyes: No icterus. Mouth: Oropharyngeal mucosa moist and pink , no lesions erythema or exudate. Lungs: Clear to auscultation bilaterally.  Heart: Regular rate and rhythm, no murmurs rubs or gallops.  Abdomen: Bowel sounds are normal, nontender, nondistended, no hepatosplenomegaly or masses, no abdominal bruits or hernia , no rebound or guarding.  Extremities: No lower extremity edema. No clubbing or deformities. Neuro: Alert and oriented x 4   Skin: Warm and dry, no jaundice.   Psych: Alert and cooperative, normal mood and affect.

## 2011-09-13 NOTE — Patient Instructions (Signed)
Take Linzess for constipation. Take one capsule 30 minutes before breakfast daily. Stop Miralax. Collect stool specimen. Call in two weeks with a progress report.

## 2011-09-14 NOTE — Progress Notes (Signed)
Faxed to PCP

## 2011-09-19 ENCOUNTER — Ambulatory Visit (INDEPENDENT_AMBULATORY_CARE_PROVIDER_SITE_OTHER): Payer: Medicare Other | Admitting: Gastroenterology

## 2011-09-19 DIAGNOSIS — K5900 Constipation, unspecified: Secondary | ICD-10-CM | POA: Diagnosis not present

## 2011-09-19 LAB — IFOBT (OCCULT BLOOD): IFOBT: NEGATIVE

## 2011-09-20 NOTE — Progress Notes (Signed)
Addressed.

## 2011-09-21 ENCOUNTER — Telehealth: Payer: Self-pay | Admitting: Gastroenterology

## 2011-09-21 MED ORDER — LINACLOTIDE 145 MCG PO CAPS: 1.0000 | ORAL_CAPSULE | Freq: Every day | ORAL | Status: DC

## 2011-09-21 NOTE — Telephone Encounter (Signed)
Done

## 2011-09-21 NOTE — Progress Notes (Signed)
Quick Note:  ifobt negative. Is constipation better on Linzess? ______

## 2011-09-21 NOTE — Telephone Encounter (Signed)
Pt called back- she said her constipation is much better on the medication and would like for [place] to call in a Rx to [address]

## 2011-09-22 DIAGNOSIS — M76899 Other specified enthesopathies of unspecified lower limb, excluding foot: Secondary | ICD-10-CM | POA: Diagnosis not present

## 2011-09-22 DIAGNOSIS — M5137 Other intervertebral disc degeneration, lumbosacral region: Secondary | ICD-10-CM | POA: Diagnosis not present

## 2011-09-25 ENCOUNTER — Telehealth: Payer: Self-pay | Admitting: Gastroenterology

## 2011-09-25 NOTE — Telephone Encounter (Signed)
Patient is calling to tell you that the Rx for Linzess cost more that she can afford, she says they really work but she cant afford it/ she uses [facility] in [place]/ Can she one more bottle of samples until something is figured out/please advise

## 2011-09-26 NOTE — Telephone Encounter (Signed)
Tried to call pt- NA and voicemail has not been set up yet. 

## 2011-09-26 NOTE — Telephone Encounter (Signed)
OK to give some samples of Linzess.  For longterm treatment of constipation: Miralax 1/2 capful daily. She tried one capful qod without relief and one capful daily caused diarrhea. Hopefully new dose will be better.   Call with any concerns.

## 2011-09-27 NOTE — Telephone Encounter (Signed)
Pt aware.

## 2011-12-15 ENCOUNTER — Encounter: Payer: Self-pay | Admitting: Internal Medicine

## 2012-01-22 ENCOUNTER — Other Ambulatory Visit: Payer: Self-pay | Admitting: Cardiology

## 2012-04-08 DIAGNOSIS — N390 Urinary tract infection, site not specified: Secondary | ICD-10-CM | POA: Diagnosis not present

## 2012-05-16 DIAGNOSIS — Z23 Encounter for immunization: Secondary | ICD-10-CM | POA: Diagnosis not present

## 2012-05-30 ENCOUNTER — Encounter: Payer: Self-pay | Admitting: Cardiology

## 2012-05-30 ENCOUNTER — Ambulatory Visit (INDEPENDENT_AMBULATORY_CARE_PROVIDER_SITE_OTHER): Payer: Medicare Other | Admitting: Cardiology

## 2012-05-30 VITALS — BP 122/72 | HR 62 | Ht 67.0 in | Wt 224.0 lb

## 2012-05-30 DIAGNOSIS — I10 Essential (primary) hypertension: Secondary | ICD-10-CM | POA: Diagnosis not present

## 2012-05-30 DIAGNOSIS — K5900 Constipation, unspecified: Secondary | ICD-10-CM | POA: Diagnosis not present

## 2012-05-30 DIAGNOSIS — I428 Other cardiomyopathies: Secondary | ICD-10-CM

## 2012-05-30 DIAGNOSIS — R0602 Shortness of breath: Secondary | ICD-10-CM

## 2012-05-30 DIAGNOSIS — K219 Gastro-esophageal reflux disease without esophagitis: Secondary | ICD-10-CM

## 2012-05-30 DIAGNOSIS — R110 Nausea: Secondary | ICD-10-CM

## 2012-05-30 DIAGNOSIS — E785 Hyperlipidemia, unspecified: Secondary | ICD-10-CM

## 2012-05-30 DIAGNOSIS — I447 Left bundle-branch block, unspecified: Secondary | ICD-10-CM

## 2012-05-30 LAB — COMPREHENSIVE METABOLIC PANEL
ALT: 13 U/L (ref 0–35)
AST: 14 U/L (ref 0–37)
Albumin: 4.2 g/dL (ref 3.5–5.2)
BUN: 19 mg/dL (ref 6–23)
Calcium: 10.2 mg/dL (ref 8.4–10.5)
Chloride: 98 mEq/L (ref 96–112)
Potassium: 4.7 mEq/L (ref 3.5–5.3)
Total Protein: 6.7 g/dL (ref 6.0–8.3)

## 2012-05-30 LAB — CBC
MCHC: 33.7 g/dL (ref 30.0–36.0)
Platelets: 255 10*3/uL (ref 150–400)
RDW: 13.1 % (ref 11.5–15.5)

## 2012-05-30 LAB — TSH: TSH: 4.076 u[IU]/mL (ref 0.350–4.500)

## 2012-05-30 NOTE — Assessment & Plan Note (Signed)
Patient remains relatively asymptomatic with respect to cardiomyopathy.  A thiazide diuretic, beta blocker and ACE inhibitor represent effective therapy.  Aldactone could be considered, but in the absence of frank congestive heart failure for quite some time, use of this medication will be deferred.

## 2012-05-30 NOTE — Progress Notes (Deleted)
Name: [name]    DOB: [birth year]  Age: 71 y.o.  MR#: [id number]       PCP:  Fredirick Maudlin, MD      Insurance: @PAYORNAME @   CC:   No chief complaint on file.  MEDICATION LIST NO RECENT LABS FROM PCP   VS BP 122/72  Pulse 62  Ht 5\' 7"  (1.702 m)  Wt 224 lb (101.606 kg)  BMI 35.08 kg/m2  Weights Current Weight  05/30/12 224 lb (101.606 kg)  09/13/11 227 lb 6.4 oz (103.148 kg)  07/13/11 221 lb (100.245 kg)    Blood Pressure  BP Readings from Last 3 Encounters:  05/30/12 122/72  09/13/11 122/69  07/13/11 120/57     Admit date:  (Not on file) Last encounter with RMR:  01/22/2012   Allergy Allergies  Allergen Reactions  . Dexlansoprazole     Lower abdominal pain.    Current Outpatient Prescriptions  Medication Sig Dispense Refill  . aspirin EC 81 MG tablet Take 81 mg by mouth daily.        . benazepril (LOTENSIN) 40 MG tablet TAKE ONE TABLET BY MOUTH EVERY DAY  90 tablet  3  . CARDIZEM CD 180 MG 24 hr capsule TAKE ONE CAPSULE DAILY.  30 each  11  . carvedilol (COREG) 25 MG tablet TAKE ONE TABLET BY MOUTH TWICE DAILY  60 tablet  6  . chlorthalidone (HYGROTON) 25 MG tablet Take 1 tablet (25 mg total) by mouth daily.  90 tablet  3  . Cholecalciferol (VITAMIN D) 400 UNITS capsule Take 400 Units by mouth daily.        . fexofenadine (ALLEGRA) 180 MG tablet Take 180 mg by mouth daily.        . fluticasone (FLONASE) 50 MCG/ACT nasal spray Place 2 sprays into the nose daily as needed.       . lansoprazole (PREVACID) 30 MG capsule Take 30 mg by mouth daily.       Marland Kitchen levothyroxine (SYNTHROID) 100 MCG tablet Take 100 mcg by mouth daily.        . Multiple Vitamins-Minerals (CENTRUM SILVER PO) Take 1 tablet by mouth daily.       . traMADol (ULTRAM) 50 MG tablet Take 50 mg by mouth every 6 (six) hours as needed. pain        Discontinued Meds:    Medications Discontinued During This Encounter  Medication Reason  . Linaclotide (LINZESS) 145 MCG CAPS Error    Patient Active  Problem List  Diagnosis  . HYPOTHYROIDISM  . HYPERLIPIDEMIA  . Hypertension  . CARDIOMYOPATHY  . LEFT BUNDLE BRANCH BLOCK  . DEGENERATIVE JOINT DISEASE  . GERD  . NAUSEA  . Dyspepsia  . Constipation    LABS No visits with results within 3 Month(s) from this visit. Latest known visit with results is:  Office Visit on 09/19/2011  Component Date Value  . IFOBT 09/19/2011 Negative      Results for this Opt Visit:     Results for orders placed in visit on 09/19/11  IFOBT (OCCULT BLOOD)      Component Value Range   IFOBT Negative      EKG Orders placed in visit on 05/28/01  . CONVERTED CEMR EKG     Prior Assessment and Plan Problem List as of 05/30/2012            Cardiology Problems   HYPERLIPIDEMIA   Last Assessment & Plan Note   05/31/2011 Office Visit Signed  05/31/2011  1:41 PM by Kathlen Brunswick, MD    Lipid profile is suboptimal, but therapy is not mandated in the absence of known vascular disease.  Moreover, patient has had adverse reactions to statins in the past.  No pharmacologic therapy will be utilized.    Hypertension   Last Assessment & Plan Note   05/31/2011 Office Visit Signed 05/31/2011  1:42 PM by Kathlen Brunswick, MD    Blood pressure control is good; current medications will be continued.    CARDIOMYOPATHY   Last Assessment & Plan Note   05/31/2011 Office Visit Addendum 06/01/2011  9:09 PM by Kathlen Brunswick, MD    Symptoms have progressed modestly.  This is a propitious time to reassess control of congestive heart failure.  A chest x-ray, BNP level and echocardiogram will be obtained in addition to routine laboratory studies.  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  Therapy will not be changed and a return visit will be planned for one year hence.    LEFT BUNDLE BRANCH BLOCK     Other   HYPOTHYROIDISM   Last Assessment & Plan Note   05/31/2011 Office Visit Signed 05/31/2011  1:43  PM by Kathlen Brunswick, MD    Euthyroid when last assessed earlier this year.    DEGENERATIVE JOINT DISEASE   GERD   Last Assessment & Plan Note   09/13/2011 Office Visit Signed 09/13/2011  4:20 PM by Tiffany Kocher, PA    Typical gerd well-controlled.    NAUSEA   Last Assessment & Plan Note   12/19/2010 Office Visit Signed 12/19/2010  9:58 PM by Nira Retort, NP    Resolved.     Dyspepsia   Last Assessment & Plan Note   09/13/2011 Office Visit Signed 09/13/2011  4:20 PM by Tiffany Kocher, PA    Some improvement. Will likely do better with taking lansoprazole 30mg  bid rather than 60mg  daily. PR in two weeks.     Constipation   Last Assessment & Plan Note   09/13/2011 Office Visit Signed 09/13/2011  4:19 PM by Tiffany Kocher, PA    Discussed numerous options with patient. She wants to try Linzess daily. #30 samples provided. Hold miralax. PR in couple of weeks. If no improvement in constipation or if ifobt positive, then proceed with TCS.        Imaging: No results found.   FRS Calculation: Score not calculated. Missing: Total Cholesterol

## 2012-05-30 NOTE — Assessment & Plan Note (Addendum)
Moderately elevated lipid total and LDL cholesterol in the presence of moderate cerebrovascular disease.  Treatment would be desirable; however, patient has experienced adverse effects with multiple statins.  Use of other agents is of unknown benefit.

## 2012-05-30 NOTE — Assessment & Plan Note (Signed)
Blood pressure measurements have been well within the normal range in recent months.  Diltiazem is contributing to bradycardia and conduction system abnormalities, but may not be necessary for treatment of hypertension.  This medication will be discontinued and blood pressure and symptoms monitored.  If hypertension recurs, treatment with amlodipine will be instituted.

## 2012-05-30 NOTE — Patient Instructions (Addendum)
Your physician recommends that you schedule a follow-up appointment in:  1 - 2 months with Dr Dietrich Pates 2 - Blood pressure check in 1 month  Your physician has requested that you regularly monitor and record your blood pressure readings at home. Please use the same machine at the same time of day to check your readings and record them to bring to your follow-up visit.  Your physician has recommended you make the following change in your medication:  1 - STOP Cardizem (Diltiazem)  Your physician recommends that you return for lab work in: Today

## 2012-05-30 NOTE — Assessment & Plan Note (Signed)
Patient had excellent response to linaclotide, but expense was prohibitive.

## 2012-05-30 NOTE — Progress Notes (Signed)
Patient ID: [name], female   DOB: 06-09-1941, 71 y.o.   MRN: [id number]  HPI: Scheduled return visit for this lovely woman with a nonischemic cardiomyopathy, hyperlipidemia and hypertension.  Since her last visit, she has done quite well.  She experiences some exertional fatigue, but does well generally by pacing herself.  She is able to perform housework without difficulty.  She has developed no new medical problems nor required urgent medical care since she was last seen here.  Prior to Admission medications   Medication Sig Start Date End Date Taking? Authorizing Provider  aspirin EC 81 MG tablet Take 81 mg by mouth daily.     Yes Historical Provider, MD  benazepril (LOTENSIN) 40 MG tablet TAKE ONE TABLET BY MOUTH EVERY DAY 07/18/11  Yes Kathlen Brunswick, MD  CARDIZEM CD 180 MG 24 hr capsule TAKE ONE CAPSULE DAILY. 06/23/11  Yes Kathlen Brunswick, MD  carvedilol (COREG) 25 MG tablet TAKE ONE TABLET BY MOUTH TWICE DAILY 01/22/12  Yes Kathlen Brunswick, MD  chlorthalidone (HYGROTON) 25 MG tablet Take 1 tablet (25 mg total) by mouth daily. 07/24/11  Yes Kathlen Brunswick, MD  Cholecalciferol (VITAMIN D) 400 UNITS capsule Take 400 Units by mouth daily.     Yes Historical Provider, MD  fexofenadine (ALLEGRA) 180 MG tablet Take 180 mg by mouth daily.     Yes Historical Provider, MD  fluticasone (FLONASE) 50 MCG/ACT nasal spray Place 2 sprays into the nose daily as needed.    Yes Historical Provider, MD  lansoprazole (PREVACID) 30 MG capsule Take 30 mg by mouth daily.    Yes Historical Provider, MD  levothyroxine (SYNTHROID) 100 MCG tablet Take 100 mcg by mouth daily.     Yes Historical Provider, MD  Multiple Vitamins-Minerals (CENTRUM SILVER PO) Take 1 tablet by mouth daily.    Yes Historical Provider, MD  traMADol (ULTRAM) 50 MG tablet Take 50 mg by mouth every 6 (six) hours as needed. pain   Yes Historical Provider, MD   Allergies  Allergen Reactions  . Dexlansoprazole     Lower abdominal pain.       Past medical history, social history, and family history reviewed and updated.  ROS: Denies orthopnea, PND, pedal edema, palpitations, lightheadedness or syncope.  All other systems reviewed and are negative.  PHYSICAL EXAM: BP 122/72  Pulse 62  Ht 5\' 7"  (1.702 m)  Wt 101.606 kg (224 lb)  BMI 35.08 kg/m2  General-Well developed; no acute distress Body habitus-proportionate weight and height Neck-No JVD; no carotid bruits Lungs-clear lung fields; resonant to percussion Cardiovascular-normal PMI; normal S1 and S2 Abdomen-normal bowel sounds; soft and non-tender without masses or organomegaly Musculoskeletal-No deformities, no cyanosis or clubbing Neurologic-Normal cranial nerves; symmetric strength and tone Skin-Warm, no significant lesions Extremities-distal pulses 1-2+; no edema  Rhythm Strip: Normal sinus rhythm at a rate of 60 bpm; IVCD; occasional PVC; first-degree AV block  ASSESSMENT AND PLAN:  [place] Bing, MD 05/30/2012 11:16 AM

## 2012-05-31 ENCOUNTER — Encounter: Payer: Self-pay | Admitting: Cardiology

## 2012-05-31 LAB — BRAIN NATRIURETIC PEPTIDE: Brain Natriuretic Peptide: 33.1 pg/mL (ref 0.0–100.0)

## 2012-06-30 ENCOUNTER — Other Ambulatory Visit: Payer: Self-pay | Admitting: Cardiology

## 2012-07-15 DIAGNOSIS — Z1231 Encounter for screening mammogram for malignant neoplasm of breast: Secondary | ICD-10-CM | POA: Diagnosis not present

## 2012-07-18 DIAGNOSIS — N6459 Other signs and symptoms in breast: Secondary | ICD-10-CM | POA: Diagnosis not present

## 2012-08-01 ENCOUNTER — Encounter: Payer: Self-pay | Admitting: Cardiology

## 2012-08-01 ENCOUNTER — Encounter: Payer: Self-pay | Admitting: *Deleted

## 2012-08-01 ENCOUNTER — Ambulatory Visit (INDEPENDENT_AMBULATORY_CARE_PROVIDER_SITE_OTHER): Payer: Medicare Other | Admitting: Cardiology

## 2012-08-01 VITALS — BP 120/70 | HR 70 | Ht 67.0 in | Wt 227.0 lb

## 2012-08-01 DIAGNOSIS — I428 Other cardiomyopathies: Secondary | ICD-10-CM

## 2012-08-01 DIAGNOSIS — I10 Essential (primary) hypertension: Secondary | ICD-10-CM

## 2012-08-01 MED ORDER — AMLODIPINE BESYLATE 2.5 MG PO TABS: 2.5000 mg | ORAL_TABLET | Freq: Every day | ORAL | Status: DC

## 2012-08-01 NOTE — Assessment & Plan Note (Signed)
With discontinuation of diltiazem at her previous visit, control of hypertension is no longer optimal.  Amlodipine will be added at low dose, and patient will continue to monitor home blood pressures.

## 2012-08-01 NOTE — Progress Notes (Deleted)
Name: GLENNIS BORGER    DOB: [birth year]  Age: 72 y.o.  MR#: [id number]       PCP:  Fredirick Maudlin, MD      Insurance: @PAYORNAME @   CC:   No chief complaint on file.  MEDICATION LIST BP LIST  VS BP 120/70  Pulse 70  Ht 5\' 7"  (1.702 m)  Wt 227 lb (102.967 kg)  BMI 35.55 kg/m2  Weights Current Weight  08/01/12 227 lb (102.967 kg)  05/30/12 224 lb (101.606 kg)  09/13/11 227 lb 6.4 oz (103.148 kg)    Blood Pressure  BP Readings from Last 3 Encounters:  08/01/12 120/70  05/30/12 122/72  09/13/11 122/69     Admit date:  (Not on file) Last encounter with RMR:  06/30/2012   Allergy Allergies  Allergen Reactions  . Dexlansoprazole     Lower abdominal pain.  . Statins Other (See Comments)    Arthralgias during treatment with at least 2 members of his class     Current Outpatient Prescriptions  Medication Sig Dispense Refill  . aspirin EC 81 MG tablet Take 81 mg by mouth daily.        . benazepril (LOTENSIN) 40 MG tablet TAKE ONE TABLET BY MOUTH EVERY DAY  90 tablet  2  . carvedilol (COREG) 25 MG tablet TAKE ONE TABLET BY MOUTH TWICE DAILY  60 tablet  6  . chlorthalidone (HYGROTON) 25 MG tablet TAKE ONE TABLET BY MOUTH EVERY DAY  90 tablet  2  . Cholecalciferol (VITAMIN D) 400 UNITS capsule Take 400 Units by mouth daily.        . fexofenadine (ALLEGRA) 180 MG tablet Take 180 mg by mouth daily.        . fluticasone (FLONASE) 50 MCG/ACT nasal spray Place 2 sprays into the nose daily as needed.       . lansoprazole (PREVACID) 30 MG capsule Take 30 mg by mouth daily.       Marland Kitchen levothyroxine (SYNTHROID) 100 MCG tablet Take 100 mcg by mouth daily.        . Multiple Vitamins-Minerals (CENTRUM SILVER PO) Take 1 tablet by mouth daily.       . traMADol (ULTRAM) 50 MG tablet Take 50 mg by mouth every 6 (six) hours as needed. pain        Discontinued Meds:   There are no discontinued medications.  Patient Active Problem List  Diagnosis  . HYPOTHYROIDISM  . HYPERLIPIDEMIA  .  Hypertension  . CARDIOMYOPATHY  . LEFT BUNDLE BRANCH BLOCK  . DEGENERATIVE JOINT DISEASE  . GERD  . NAUSEA  . Constipation    LABS Office Visit on 05/30/2012  Component Date Value  . Sodium 05/30/2012 138   . Potassium 05/30/2012 4.7   . Chloride 05/30/2012 98   . CO2 05/30/2012 29   . Glucose, Bld 05/30/2012 84   . BUN 05/30/2012 19   . Creat 05/30/2012 1.16*  . Total Bilirubin 05/30/2012 0.5   . Alkaline Phosphatase 05/30/2012 60   . AST 05/30/2012 14   . ALT 05/30/2012 13   . Total Protein 05/30/2012 6.7   . Albumin 05/30/2012 4.2   . Calcium 05/30/2012 10.2   . WBC 05/30/2012 7.9   . RBC 05/30/2012 4.35   . Hemoglobin 05/30/2012 13.0   . HCT 05/30/2012 38.6   . MCV 05/30/2012 88.7   . [facility] 05/30/2012 29.9   . MCHC 05/30/2012 33.7   . RDW 05/30/2012 13.1   . Platelets 05/30/2012  255   . Brain Natriuretic Peptide 05/30/2012 33.1   . TSH 05/30/2012 4.076      Results for this Opt Visit:     Results for orders placed in visit on 05/30/12  COMPREHENSIVE METABOLIC PANEL      Component Value Range   Sodium 138  135 - 145 mEq/L   Potassium 4.7  3.5 - 5.3 mEq/L   Chloride 98  96 - 112 mEq/L   CO2 29  19 - 32 mEq/L   Glucose, Bld 84  70 - 99 mg/dL   BUN 19  6 - 23 mg/dL   Creat 9.81 (*) 1.91 - 1.10 mg/dL   Total Bilirubin 0.5  0.3 - 1.2 mg/dL   Alkaline Phosphatase 60  39 - 117 U/L   AST 14  0 - 37 U/L   ALT 13  0 - 35 U/L   Total Protein 6.7  6.0 - 8.3 g/dL   Albumin 4.2  3.5 - 5.2 g/dL   Calcium 47.8  8.4 - 29.5 mg/dL  CBC      Component Value Range   WBC 7.9  4.0 - 10.5 K/uL   RBC 4.35  3.87 - 5.11 MIL/uL   Hemoglobin 13.0  12.0 - 15.0 g/dL   HCT 62.1  30.8 - 65.7 %   MCV 88.7  78.0 - 100.0 fL   MCH 29.9  26.0 - 34.0 pg   MCHC 33.7  30.0 - 36.0 g/dL   RDW 84.6  96.2 - 95.2 %   Platelets 255  150 - 400 K/uL  BRAIN NATRIURETIC PEPTIDE      Component Value Range   Brain Natriuretic Peptide 33.1  0.0 - 100.0 pg/mL  TSH      Component Value Range   TSH  4.076  0.350 - 4.500 uIU/mL    EKG Orders placed in visit on 08/01/12  . EKG 12-LEAD     Prior Assessment and Plan Problem List as of 08/01/2012            Cardiology Problems   HYPERLIPIDEMIA   Last Assessment & Plan Note   05/30/2012 Office Visit Addendum 05/31/2012 10:00 AM by Kathlen Brunswick, MD    Moderately elevated lipid total and LDL cholesterol in the presence of moderate cerebrovascular disease.  Treatment would be desirable; however, patient has experienced adverse effects with multiple statins.  Use of other agents is of unknown benefit.    Hypertension   Last Assessment & Plan Note   05/30/2012 Office Visit Signed 05/30/2012 12:45 PM by Kathlen Brunswick, MD    Blood pressure measurements have been well within the normal range in recent months.  Diltiazem is contributing to bradycardia and conduction system abnormalities, but may not be necessary for treatment of hypertension.  This medication will be discontinued and blood pressure and symptoms monitored.  If hypertension recurs, treatment with amlodipine will be instituted.    CARDIOMYOPATHY   Last Assessment & Plan Note   05/30/2012 Office Visit Signed 05/30/2012 12:41 PM by Kathlen Brunswick, MD    Patient remains relatively asymptomatic with respect to cardiomyopathy.  A thiazide diuretic, beta blocker and ACE inhibitor represent effective therapy.  Aldactone could be considered, but in the absence of frank congestive heart failure for quite some time, use of this medication will be deferred.    LEFT BUNDLE BRANCH BLOCK     Other   HYPOTHYROIDISM   Last Assessment & Plan Note   05/31/2011 Office Visit Signed  05/31/2011  1:43 PM by Kathlen Brunswick, MD    Euthyroid when last assessed earlier this year.    DEGENERATIVE JOINT DISEASE   GERD   Last Assessment & Plan Note   09/13/2011 Office Visit Signed 09/13/2011  4:20 PM by Tiffany Kocher, PA    Typical gerd well-controlled.    NAUSEA   Last Assessment &  Plan Note   12/19/2010 Office Visit Signed 12/19/2010  9:58 PM by Nira Retort, NP    Resolved.     Constipation   Last Assessment & Plan Note   05/30/2012 Office Visit Signed 05/30/2012 12:42 PM by Kathlen Brunswick, MD    Patient had excellent response to linaclotide, but expense was prohibitive.        Imaging: No results found.   FRS Calculation: Score not calculated. Missing: Total Cholesterol

## 2012-08-01 NOTE — Patient Instructions (Addendum)
Your physician recommends that you schedule a follow-up appointment in: 6 MONTH FOLLOW UP  Your physician has recommended you make the following change in your medication:  1 - START NORVASC (AMLODIPINE) 2.5 MG DIALY  Your physician has requested that you regularly monitor and record your blood pressure readings at home. Please use the same machine at the same time of day to check your readings and record them to bring to your follow-up visit.  Your physician recommends that you return for lab work in: 4 MONTHS

## 2012-08-01 NOTE — Progress Notes (Signed)
Patient ID: [name], female   DOB: 10/06/1940, 72 y.o.   MRN: [id number]  HPI: Return visit, as scheduled, for this lovely woman with moderately severe nonischemic cardiomyopathy and hypertension.  Since her last visit, she has done superbly.  She is able to perform all of her housework without major difficulty, albeit she requires more time and more periods of rest.  She has not developed any new medical problems nor required urgent medical care.  She has monitored blood pressure at home with generally good, but not optimal results.  Prior to Admission medications   Medication Sig Start Date End Date Taking? Authorizing Provider  aspirin EC 81 MG tablet Take 81 mg by mouth daily.     Yes Historical Provider, MD  benazepril (LOTENSIN) 40 MG tablet TAKE ONE TABLET BY MOUTH EVERY DAY 06/30/12  Yes Kathlen Brunswick, MD  carvedilol (COREG) 25 MG tablet TAKE ONE TABLET BY MOUTH TWICE DAILY 01/22/12  Yes Kathlen Brunswick, MD  chlorthalidone (HYGROTON) 25 MG tablet TAKE ONE TABLET BY MOUTH EVERY DAY 06/30/12  Yes Kathlen Brunswick, MD  Cholecalciferol (VITAMIN D) 400 UNITS capsule Take 400 Units by mouth daily.     Yes Historical Provider, MD  fexofenadine (ALLEGRA) 180 MG tablet Take 180 mg by mouth daily.     Yes Historical Provider, MD  fluticasone (FLONASE) 50 MCG/ACT nasal spray Place 2 sprays into the nose daily as needed.    Yes Historical Provider, MD  lansoprazole (PREVACID) 30 MG capsule Take 30 mg by mouth daily.    Yes Historical Provider, MD  levothyroxine (SYNTHROID) 100 MCG tablet Take 100 mcg by mouth daily.     Yes Historical Provider, MD  Multiple Vitamins-Minerals (CENTRUM SILVER PO) Take 1 tablet by mouth daily.    Yes Historical Provider, MD  traMADol (ULTRAM) 50 MG tablet Take 50 mg by mouth every 6 (six) hours as needed. pain   Yes Historical Provider, MD  amLODipine (NORVASC) 2.5 MG tablet Take 1 tablet (2.5 mg total) by mouth daily. 08/01/12   Kathlen Brunswick, MD    Allergies  Allergen Reactions  . Dexlansoprazole     Lower abdominal pain.  . Statins Other (See Comments)    Arthralgias during treatment with at least 2 members of his class   Past medical history, social history, and family history reviewed and updated.  ROS: Denies orthopnea, PND, pedal edema, palpitations, lightheadedness or syncope.  She experiences no chest discomfort.  All other systems reviewed and are negative.  PHYSICAL EXAM: BP 120/70  Pulse 70  Ht 5\' 7"  (1.702 m)  Wt 102.967 kg (227 lb)  BMI 35.55 kg/m2  General-Well developed; no acute distress Body habitus-moderately overweight Neck-No JVD; no carotid bruits Lungs-clear lung fields; resonant to percussion Cardiovascular-normal PMI; normal S1 and S2 Abdomen-normal bowel sounds; soft and non-tender without masses or organomegaly Musculoskeletal-No deformities, no cyanosis or clubbing Neurologic-Normal cranial nerves; symmetric strength and tone Skin-Warm, no significant lesions Extremities-distal pulses intact; no edema  EKG: normal sinus rhythm; left atrial abnormality; left bundle branch block.  No previous tracing for comparison.  ASSESSMENT AND PLAN:  [place] Bing, MD 08/01/2012 2:29 PM

## 2012-08-01 NOTE — Assessment & Plan Note (Signed)
Patient has remained remarkably stable since the first manifestations of cardiomyopathy more than 10 years ago.  At present, she has no symptoms or signs to suggest decompensation.  BNP level was entirely normal when last assessed a few months ago.  Current medications will be continued.  EF>35% on her most recent echocardiogram approximately one year ago.  Accordingly, she does not qualify for consideration of an AICD.

## 2012-09-21 ENCOUNTER — Other Ambulatory Visit: Payer: Self-pay | Admitting: Cardiology

## 2012-11-04 DIAGNOSIS — N390 Urinary tract infection, site not specified: Secondary | ICD-10-CM | POA: Diagnosis not present

## 2012-11-19 ENCOUNTER — Other Ambulatory Visit: Payer: Self-pay | Admitting: Cardiology

## 2012-11-19 DIAGNOSIS — I428 Other cardiomyopathies: Secondary | ICD-10-CM | POA: Diagnosis not present

## 2012-11-19 DIAGNOSIS — I10 Essential (primary) hypertension: Secondary | ICD-10-CM | POA: Diagnosis not present

## 2012-11-20 LAB — BASIC METABOLIC PANEL
Calcium: 9.4 mg/dL (ref 8.4–10.5)
Creat: 0.89 mg/dL (ref 0.50–1.10)

## 2012-11-22 ENCOUNTER — Encounter: Payer: Self-pay | Admitting: Cardiology

## 2012-11-25 ENCOUNTER — Encounter: Payer: Self-pay | Admitting: *Deleted

## 2013-01-29 ENCOUNTER — Encounter: Payer: Self-pay | Admitting: Cardiology

## 2013-01-29 ENCOUNTER — Ambulatory Visit (INDEPENDENT_AMBULATORY_CARE_PROVIDER_SITE_OTHER): Payer: Medicare Other | Admitting: Cardiology

## 2013-01-29 ENCOUNTER — Ambulatory Visit (HOSPITAL_COMMUNITY)
Admission: RE | Admit: 2013-01-29 | Discharge: 2013-01-29 | Disposition: A | Discharge status: Home / Self Care | Payer: Medicare Other | Source: Ambulatory Visit | Attending: Cardiology | Admitting: Cardiology

## 2013-01-29 VITALS — BP 118/88 | HR 60 | Ht 67.0 in | Wt 225.0 lb

## 2013-01-29 DIAGNOSIS — I509 Heart failure, unspecified: Secondary | ICD-10-CM | POA: Insufficient documentation

## 2013-01-29 DIAGNOSIS — I428 Other cardiomyopathies: Secondary | ICD-10-CM

## 2013-01-29 DIAGNOSIS — I5040 Unspecified combined systolic (congestive) and diastolic (congestive) heart failure: Secondary | ICD-10-CM

## 2013-01-29 DIAGNOSIS — I447 Left bundle-branch block, unspecified: Secondary | ICD-10-CM | POA: Diagnosis not present

## 2013-01-29 DIAGNOSIS — I10 Essential (primary) hypertension: Secondary | ICD-10-CM | POA: Diagnosis not present

## 2013-01-29 DIAGNOSIS — R0602 Shortness of breath: Secondary | ICD-10-CM

## 2013-01-29 DIAGNOSIS — I429 Cardiomyopathy, unspecified: Secondary | ICD-10-CM

## 2013-01-29 DIAGNOSIS — J9819 Other pulmonary collapse: Secondary | ICD-10-CM | POA: Diagnosis not present

## 2013-01-29 MED ORDER — SPIRONOLACTONE 25 MG PO TABS: 25.0000 mg | ORAL_TABLET | Freq: Every day | ORAL | Status: DC

## 2013-01-29 NOTE — Progress Notes (Deleted)
Name: [name]    DOB: 1941-06-25  Age: 72 y.o.  MR#: [id number]       PCP:  Fredirick Maudlin, MD      Insurance: Payor: MEDICARE / Plan: MEDICARE PART A AND B / Product Type: *No Product type* /   CC:    Chief Complaint  Patient presents with  . Appointment    shob. some swelling. some tiredness and fatigue.    LIST VS Filed Vitals:   01/29/13 1505  BP: 118/88  Pulse: 60  Height: 5\' 7"  (1.702 m)  Weight: 225 lb (102.059 kg)    Weights Current Weight  01/29/13 225 lb (102.059 kg)  08/01/12 227 lb (102.967 kg)  05/30/12 224 lb (101.606 kg)    Blood Pressure  BP Readings from Last 3 Encounters:  01/29/13 118/88  08/01/12 120/70  05/30/12 122/72     Admit date:  (Not on file) Last encounter with RMR:  09/21/2012   Allergy Dexlansoprazole and Statins  Current Outpatient Prescriptions  Medication Sig Dispense Refill  . amLODipine (NORVASC) 2.5 MG tablet Take 1 tablet (2.5 mg total) by mouth daily.  30 tablet  6  . aspirin EC 81 MG tablet Take 81 mg by mouth daily.        . benazepril (LOTENSIN) 40 MG tablet TAKE ONE TABLET BY MOUTH EVERY DAY  90 tablet  2  . carvedilol (COREG) 25 MG tablet       . chlorthalidone (HYGROTON) 25 MG tablet TAKE ONE TABLET BY MOUTH EVERY DAY  90 tablet  2  . Cholecalciferol (VITAMIN D) 400 UNITS capsule Take 400 Units by mouth daily.        . fexofenadine (ALLEGRA) 180 MG tablet Take 180 mg by mouth daily.        . fluticasone (FLONASE) 50 MCG/ACT nasal spray Place 2 sprays into the nose daily as needed.       . lansoprazole (PREVACID) 30 MG capsule Take 30 mg by mouth daily.       Marland Kitchen levothyroxine (SYNTHROID) 100 MCG tablet Take 100 mcg by mouth daily.        . Multiple Vitamins-Minerals (CENTRUM SILVER PO) Take 1 tablet by mouth daily.       . traMADol (ULTRAM) 50 MG tablet Take 50 mg by mouth every 6 (six) hours as needed. pain       No current facility-administered medications for this visit.    Discontinued Meds:    Medications  Discontinued During This Encounter  Medication Reason  . carvedilol (COREG) 25 MG tablet     Patient Active Problem List   Diagnosis Date Noted  . Constipation 09/13/2011  . GERD 09/13/2010  . HYPOTHYROIDISM 04/28/2009  . HYPERLIPIDEMIA 04/28/2009  . Hypertension 04/28/2009  . CARDIOMYOPATHY 04/28/2009  . LEFT BUNDLE BRANCH BLOCK 04/28/2009  . DEGENERATIVE JOINT DISEASE 04/28/2009    LABS    Component Value Date/Time   NA 134* 11/19/2012 1400   NA 138 05/30/2012 1138   NA 136 05/31/2011 1339   K 4.6 11/19/2012 1400   K 4.7 05/30/2012 1138   K 4.1 05/31/2011 1339   CL 96 11/19/2012 1400   CL 98 05/30/2012 1138   CL 97 05/31/2011 1339   CO2 29 11/19/2012 1400   CO2 29 05/30/2012 1138   CO2 27 05/31/2011 1339   GLUCOSE 78 11/19/2012 1400   GLUCOSE 84 05/30/2012 1138   GLUCOSE 83 05/31/2011 1339   BUN 13 11/19/2012 1400   BUN  19 05/30/2012 1138   BUN 21 05/31/2011 1339   CREATININE 0.89 11/19/2012 1400   CREATININE 1.16* 05/30/2012 1138   CREATININE 1.28* 05/31/2011 1339   CREATININE 1.07 05/31/2010 2221   CREATININE 0.99 10/04/2009   CREATININE 0.88 04/28/2009 2241   CALCIUM 9.4 11/19/2012 1400   CALCIUM 10.2 05/30/2012 1138   CALCIUM 9.3 05/31/2011 1339   CMP     Component Value Date/Time   NA 134* 11/19/2012 1400   K 4.6 11/19/2012 1400   CL 96 11/19/2012 1400   CO2 29 11/19/2012 1400   GLUCOSE 78 11/19/2012 1400   BUN 13 11/19/2012 1400   CREATININE 0.89 11/19/2012 1400   CREATININE 1.07 05/31/2010 2221   CALCIUM 9.4 11/19/2012 1400   PROT 6.7 05/30/2012 1138   ALBUMIN 4.2 05/30/2012 1138   AST 14 05/30/2012 1138   ALT 13 05/30/2012 1138   ALKPHOS 60 05/30/2012 1138   BILITOT 0.5 05/30/2012 1138       Component Value Date/Time   WBC 7.9 05/30/2012 1138   WBC 7.5 05/31/2011 1339   WBC 8.9 05/31/2010 2221   HGB 13.0 05/30/2012 1138   HGB 12.8 05/31/2011 1339   HGB 12.9 05/31/2010 2221   HCT 38.6 05/30/2012 1138   HCT 38.9 05/31/2011 1339   HCT 38.8 05/31/2010 2221   MCV  88.7 05/30/2012 1138   MCV 92.4 05/31/2011 1339   MCV 90.2 05/31/2010 2221    Lipid Panel     Component Value Date/Time   CHOL 219 10/04/2009   TRIG 95 10/04/2009   HDL 42 10/04/2009   LDLCALC 158 10/04/2009    ABG No results found for this basename: phart, pco2, pco2art, po2, po2art, hco3, tco2, acidbasedef, o2sat     Lab Results  Component Value Date   TSH 4.076 05/30/2012   BNP (last 3 results) No results found for this basename: PROBNP,  in the last 8760 hours Cardiac Panel (last 3 results) No results found for this basename: CKTOTAL, CKMB, TROPONINI, RELINDX,  in the last 72 hours  Iron/TIBC/Ferritin No results found for this basename: iron, tibc, ferritin     EKG Orders placed in visit on 08/01/12  . EKG 12-LEAD     Prior Assessment and Plan Problem List as of 01/29/2013   HYPOTHYROIDISM   Last Assessment & Plan   05/31/2011 Office Visit Written 05/31/2011  1:43 PM by Kathlen Brunswick, MD     Euthyroid when last assessed earlier this year.    HYPERLIPIDEMIA   Last Assessment & Plan   05/30/2012 Office Visit Edited 05/31/2012 10:00 AM by Kathlen Brunswick, MD     Moderately elevated lipid total and LDL cholesterol in the presence of moderate cerebrovascular disease.  Treatment would be desirable; however, patient has experienced adverse effects with multiple statins.  Use of other agents is of unknown benefit.    Hypertension   Last Assessment & Plan   08/01/2012 Office Visit Written 08/01/2012  2:35 PM by Kathlen Brunswick, MD     With discontinuation of diltiazem at her previous visit, control of hypertension is no longer optimal.  Amlodipine will be added at low dose, and patient will continue to monitor home blood pressures.    CARDIOMYOPATHY   Last Assessment & Plan   08/01/2012 Office Visit Written 08/01/2012  2:34 PM by Kathlen Brunswick, MD     Patient has remained remarkably stable since the first manifestations of cardiomyopathy more than 10 years ago.  At  present, she  has no symptoms or signs to suggest decompensation.  BNP level was entirely normal when last assessed a few months ago.  Current medications will be continued.  EF>35% on her most recent echocardiogram approximately one year ago.  Accordingly, she does not qualify for consideration of an AICD.    LEFT BUNDLE BRANCH BLOCK   DEGENERATIVE JOINT DISEASE   GERD   Last Assessment & Plan   09/13/2011 Office Visit Written 09/13/2011  4:20 PM by Tiffany Kocher, PA     Typical gerd well-controlled.    Constipation   Last Assessment & Plan   05/30/2012 Office Visit Written 05/30/2012 12:42 PM by Kathlen Brunswick, MD     Patient had excellent response to linaclotide, but expense was prohibitive.        Imaging: No results found.

## 2013-01-29 NOTE — Patient Instructions (Addendum)
Your physician recommends that you schedule a follow-up appointment in: WE WILL SCHEDULE YOU WITH THE FIRST AVAILABLE APPOINTMENT  A chest x-ray takes a picture of the organs and structures inside the chest, including the heart, lungs, and blood vessels. This test can show several things, including, whether the heart is enlarges; whether fluid is building up in the lungs; and whether pacemaker / defibrillator leads are still in place.  Your physician recommends that you return for lab work in: TODAY (SLIPS GIVEN FOR CMET,CBC,BNP) TWO WEEKS AND FOUR WEEKS Your physician recommends that you have follow up lab work, we will mail you a reminder letter to alert you when to go [employer], located across the street from our office.   Your physician has requested that you have an echocardiogram. Echocardiography is a painless test that uses sound waves to create images of your heart. It provides your doctor with information about the size and shape of your heart and how well your heart's chambers and valves are working. This procedure takes approximately one hour. There are no restrictions for this procedure.  Your physician has recommended you make the following change in your medication:  1) START ALDACTONE 25MG  ONCE DAILY

## 2013-01-30 LAB — COMPREHENSIVE METABOLIC PANEL
AST: 20 U/L (ref 0–37)
Albumin: 4.2 g/dL (ref 3.5–5.2)
BUN: 14 mg/dL (ref 6–23)
Calcium: 9.5 mg/dL (ref 8.4–10.5)
Chloride: 98 mEq/L (ref 96–112)
Glucose, Bld: 90 mg/dL (ref 70–99)
Potassium: 3.7 mEq/L (ref 3.5–5.3)
Sodium: 135 mEq/L (ref 135–145)
Total Protein: 7 g/dL (ref 6.0–8.3)

## 2013-01-30 LAB — CBC
Hemoglobin: 13.6 g/dL (ref 12.0–15.0)
MCHC: 33.7 g/dL (ref 30.0–36.0)
Platelets: 246 10*3/uL (ref 150–400)

## 2013-01-30 LAB — BRAIN NATRIURETIC PEPTIDE: Brain Natriuretic Peptide: 49.9 pg/mL (ref 0.0–100.0)

## 2013-01-30 LAB — BASIC METABOLIC PANEL
Chloride: 98 mEq/L (ref 96–112)
Potassium: 3.7 mEq/L (ref 3.5–5.3)

## 2013-02-01 ENCOUNTER — Encounter: Payer: Self-pay | Admitting: Cardiology

## 2013-02-03 ENCOUNTER — Ambulatory Visit (HOSPITAL_COMMUNITY)
Admission: RE | Admit: 2013-02-03 | Discharge: 2013-02-03 | Disposition: A | Discharge status: Home / Self Care | Payer: Medicare Other | Source: Ambulatory Visit | Attending: Cardiology | Admitting: Cardiology

## 2013-02-03 ENCOUNTER — Encounter: Payer: Self-pay | Admitting: *Deleted

## 2013-02-03 DIAGNOSIS — I428 Other cardiomyopathies: Secondary | ICD-10-CM | POA: Insufficient documentation

## 2013-02-03 DIAGNOSIS — I447 Left bundle-branch block, unspecified: Secondary | ICD-10-CM | POA: Insufficient documentation

## 2013-02-03 DIAGNOSIS — I10 Essential (primary) hypertension: Secondary | ICD-10-CM | POA: Diagnosis not present

## 2013-02-03 DIAGNOSIS — I519 Heart disease, unspecified: Secondary | ICD-10-CM | POA: Diagnosis not present

## 2013-02-03 DIAGNOSIS — E785 Hyperlipidemia, unspecified: Secondary | ICD-10-CM | POA: Diagnosis not present

## 2013-02-03 DIAGNOSIS — I429 Cardiomyopathy, unspecified: Secondary | ICD-10-CM

## 2013-02-03 NOTE — Progress Notes (Signed)
*  PRELIMINARY RESULTS* Echocardiogram 2D Echocardiogram has been performed.  Conrad  02/03/2013, 11:49 AM

## 2013-02-06 ENCOUNTER — Ambulatory Visit: Payer: Medicare Other | Admitting: Cardiology

## 2013-02-11 NOTE — Progress Notes (Signed)
Patient ID: [name], female   DOB: December 20, 1940, 72 y.o.   MRN: [id number]  HPI: Scheduled return visit for this nice woman with long-standing cardiomyopathy. Since her last visit, symptoms have been somewhat worse. She experiences dyspnea with mild exertion and describes modest daytime somnolence and snoring, but has never previously been evaluated for sleep apnea.  Current Outpatient Prescriptions  Medication Sig Dispense Refill  . amLODipine (NORVASC) 2.5 MG tablet Take 1 tablet (2.5 mg total) by mouth daily.  30 tablet  6  . aspirin EC 81 MG tablet Take 81 mg by mouth daily.        . benazepril (LOTENSIN) 40 MG tablet TAKE ONE TABLET BY MOUTH EVERY DAY  90 tablet  2  . carvedilol (COREG) 25 MG tablet       . chlorthalidone (HYGROTON) 25 MG tablet TAKE ONE TABLET BY MOUTH EVERY DAY  90 tablet  2  . Cholecalciferol (VITAMIN D) 400 UNITS capsule Take 400 Units by mouth daily.        . fexofenadine (ALLEGRA) 180 MG tablet Take 180 mg by mouth daily.        . fluticasone (FLONASE) 50 MCG/ACT nasal spray Place 2 sprays into the nose daily as needed.       . lansoprazole (PREVACID) 30 MG capsule Take 30 mg by mouth daily.       Marland Kitchen levothyroxine (SYNTHROID) 100 MCG tablet Take 100 mcg by mouth daily.        . Multiple Vitamins-Minerals (CENTRUM SILVER PO) Take 1 tablet by mouth daily.       . traMADol (ULTRAM) 50 MG tablet Take 50 mg by mouth every 6 (six) hours as needed. pain      . spironolactone (ALDACTONE) 25 MG tablet Take 1 tablet (25 mg total) by mouth daily.  90 tablet  1   No current facility-administered medications for this visit.   Allergies  Allergen Reactions  . Dexlansoprazole     Lower abdominal pain.  . Statins Other (See Comments)    Arthralgias during treatment with at least 2 members of his class   Past medical history, social history, and family history reviewed and updated.  ROS: Denies orthopnea, PND, lightheadedness or syncope. All other systems reviewed and are  negative.  PHYSICAL EXAM: BP 118/88  Pulse 60  Ht 5\' 7"  (1.702 m)  Wt 102.059 kg (225 lb)  BMI 35.23 kg/m2;  Body mass index is 35.23 kg/(m^2). General-Well developed; no acute distress Body habitus-overweight Neck-No JVD; no carotid bruits Lungs-clear lung fields; resonant to percussion Cardiovascular-normal PMI; normal S1 and S2; modest systolic ejection murmur; frequent prematures Abdomen-normal bowel sounds; soft and non-tender without masses or organomegaly Musculoskeletal-No deformities, no cyanosis or clubbing Neurologic-Normal cranial nerves; symmetric strength and tone Skin-Warm, no significant lesions Extremities-distal pulses intact; 1+ ankle edema  Rhythm Strip:  Normal sinus rhythm; first degree AV block; frequent PVCs; IVCD.  [place] Bing, MD 02/11/2013  8:51 PM  ASSESSMENT AND PLAN

## 2013-02-11 NOTE — Assessment & Plan Note (Signed)
Patient is modestly worse from a symptomatic standpoint. Spironolactone 25 mg per day will be added to her medical regime with close monitoring of electrolytes and renal function. Extent of CHF will be reassessed with chest x-ray, echocardiogram and basic laboratory testing.

## 2013-02-12 ENCOUNTER — Other Ambulatory Visit: Payer: Self-pay | Admitting: Cardiology

## 2013-02-12 DIAGNOSIS — I447 Left bundle-branch block, unspecified: Secondary | ICD-10-CM | POA: Diagnosis not present

## 2013-02-12 DIAGNOSIS — I428 Other cardiomyopathies: Secondary | ICD-10-CM | POA: Diagnosis not present

## 2013-02-12 DIAGNOSIS — I10 Essential (primary) hypertension: Secondary | ICD-10-CM | POA: Diagnosis not present

## 2013-02-12 DIAGNOSIS — R0602 Shortness of breath: Secondary | ICD-10-CM | POA: Diagnosis not present

## 2013-02-12 DIAGNOSIS — I5040 Unspecified combined systolic (congestive) and diastolic (congestive) heart failure: Secondary | ICD-10-CM | POA: Diagnosis not present

## 2013-02-12 LAB — BASIC METABOLIC PANEL
BUN: 20 mg/dL (ref 6–23)
Calcium: 9.1 mg/dL (ref 8.4–10.5)
Chloride: 94 mEq/L — ABNORMAL LOW (ref 96–112)
Creat: 1.19 mg/dL — ABNORMAL HIGH (ref 0.50–1.10)

## 2013-02-13 ENCOUNTER — Ambulatory Visit (INDEPENDENT_AMBULATORY_CARE_PROVIDER_SITE_OTHER): Payer: Medicare Other | Admitting: Cardiology

## 2013-02-13 ENCOUNTER — Ambulatory Visit (HOSPITAL_COMMUNITY)
Admission: RE | Admit: 2013-02-13 | Discharge: 2013-02-13 | Disposition: A | Discharge status: Home / Self Care | Payer: Medicare Other | Source: Ambulatory Visit | Attending: Cardiology | Admitting: Cardiology

## 2013-02-13 ENCOUNTER — Encounter: Payer: Self-pay | Admitting: Cardiology

## 2013-02-13 VITALS — BP 124/72 | HR 70 | Ht 67.0 in | Wt 224.2 lb

## 2013-02-13 DIAGNOSIS — I2699 Other pulmonary embolism without acute cor pulmonale: Secondary | ICD-10-CM

## 2013-02-13 DIAGNOSIS — M1990 Unspecified osteoarthritis, unspecified site: Secondary | ICD-10-CM

## 2013-02-13 DIAGNOSIS — R0602 Shortness of breath: Secondary | ICD-10-CM | POA: Insufficient documentation

## 2013-02-13 DIAGNOSIS — I10 Essential (primary) hypertension: Secondary | ICD-10-CM | POA: Diagnosis not present

## 2013-02-13 DIAGNOSIS — I428 Other cardiomyopathies: Secondary | ICD-10-CM

## 2013-02-13 MED ORDER — IOHEXOL 350 MG/ML SOLN
80.0000 mL | Freq: Once | INTRAVENOUS | Status: AC | PRN
  Administered 2013-02-13: 80 mL via INTRAVENOUS

## 2013-02-13 NOTE — Patient Instructions (Addendum)
Your physician recommends that you schedule a follow-up appointment in: 6 MONTHS   Your physician has recommended you make the following change in your medication:   1) DECREASE ALDACTONE TO 12.5MG  ONCE DAILY 2) TAKE MOTRIN OTC THREE TIMES DAILY AS NEEDED  Non-Cardiac CT scanning, (CAT scanning)OF THE CHEST is a noninvasive, special x-ray that produces cross-sectional images of the body using x-rays and a computer. CT scans help physicians diagnose and treat medical conditions. For some CT exams, a contrast material is used to enhance visibility in the area of the body being studied. CT scans provide greater clarity and reveal more details than regular x-ray exams.WE WILL CALL YOU WITH THE RESULTS  Your physician recommends that you increase your activity are tolerated.

## 2013-02-13 NOTE — Progress Notes (Signed)
Patient ID: [name], female   DOB: [birth year], 72 y.o.   MRN: [id number]  HPI: Scheduled return visit for this nice woman with long-standing cardiomyopathy. Symptomatically, she is unchanged with decreased exercise tolerance, exercise induced fatigue and dyspnea on mild effort.  Current Outpatient Prescriptions  Medication Sig Dispense Refill  . amLODipine (NORVASC) 2.5 MG tablet Take 1 tablet (2.5 mg total) by mouth daily.  30 tablet  6  . aspirin EC 81 MG tablet Take 81 mg by mouth daily.        . benazepril (LOTENSIN) 40 MG tablet TAKE ONE TABLET BY MOUTH EVERY DAY  90 tablet  2  . carvedilol (COREG) 25 MG tablet Take 37.5 mg by mouth daily. Takes 0.5 in the morning and one tablet in the evening      . chlorthalidone (HYGROTON) 25 MG tablet Take 25 mg by mouth daily.      . cholecalciferol (VITAMIN D) 1000 UNITS tablet Take 1,000 Units by mouth daily.      . fexofenadine (ALLEGRA) 180 MG tablet Take 180 mg by mouth daily.        . fluticasone (FLONASE) 50 MCG/ACT nasal spray Place 2 sprays into the nose daily as needed.       . lansoprazole (PREVACID) 15 MG capsule Take 15 mg by mouth daily.      Marland Kitchen levothyroxine (SYNTHROID) 100 MCG tablet Take 100 mcg by mouth daily.        . Multiple Vitamins-Minerals (CENTRUM SILVER PO) Take 1 tablet by mouth daily.       Marland Kitchen spironolactone (ALDACTONE) 25 MG tablet Take 1 tablet (25 mg total) by mouth daily.  90 tablet  1  . traMADol (ULTRAM) 50 MG tablet Take 50 mg by mouth every 6 (six) hours as needed. pain       No current facility-administered medications for this visit.   Allergies  Allergen Reactions  . Dexlansoprazole     Lower abdominal pain.  . Statins Other (See Comments)    Arthralgias during treatment with at least 2 members of his class   Past medical history, social history, and family history reviewed and updated.  ROS: Patient notes at least a degree of daytime somnolence. She is no history of definite exposure to toxins  although she did work in a [employer]. She's had no history of pulmonary problems, DVT or pulmonary embolism.  PHYSICAL EXAM: BP 124/72  Pulse 70  Ht 5\' 7"  (1.702 m)  Wt 101.696 kg (224 lb 3.2 oz)  BMI 35.11 kg/m2;  Body mass index is 35.11 kg/(m^2). General-Well developed; no acute distress Body habitus-moderately overweight Neck-No JVD; no carotid bruits Lungs-clear lung fields; resonant to percussion Cardiovascular-normal PMI; normal S1 and S2; no third heart sound Abdomen-normal bowel sounds; soft and non-tender without masses or organomegaly Musculoskeletal-No deformities, no cyanosis or clubbing Neurologic-Normal cranial nerves; symmetric strength and tone Skin-Warm, no significant lesions Extremities-distal pulses intact; no edema  EKG: Normal sinus rhythm; left bundle branch block; no previous tracing for comparison.  6 minute walk test: Patient traversed 700 feet without any significant increase in heart rate. Oxygen saturation was 97% throughout  [place] Bing, MD 02/13/2013  3:33 PM  ASSESSMENT AND PLAN

## 2013-02-13 NOTE — Progress Notes (Deleted)
Name: [name]    DOB: 1941/02/15  Age: 72 y.o.  MR#: [id number]       PCP:  Fredirick Maudlin, MD      Insurance: Payor: MEDICARE / Plan: MEDICARE PART A AND B / Product Type: *No Product type* /   CC:   No chief complaint on file.  LIST  VS Filed Vitals:   02/13/13 1514  BP: 124/72  Pulse: 70  Height: 5\' 7"  (1.702 m)  Weight: 224 lb 3.2 oz (101.696 kg)    Weights Current Weight  02/13/13 224 lb 3.2 oz (101.696 kg)  01/29/13 225 lb (102.059 kg)  08/01/12 227 lb (102.967 kg)    Blood Pressure  BP Readings from Last 3 Encounters:  02/13/13 124/72  01/29/13 118/88  08/01/12 120/70     Admit date:  (Not on file) Last encounter with RMR:  02/06/2013   Allergy Dexlansoprazole and Statins  Current Outpatient Prescriptions  Medication Sig Dispense Refill  . amLODipine (NORVASC) 2.5 MG tablet Take 1 tablet (2.5 mg total) by mouth daily.  30 tablet  6  . aspirin EC 81 MG tablet Take 81 mg by mouth daily.        . benazepril (LOTENSIN) 40 MG tablet TAKE ONE TABLET BY MOUTH EVERY DAY  90 tablet  2  . carvedilol (COREG) 25 MG tablet Take 37.5 mg by mouth daily. Takes 0.5 in the morning and one tablet in the evening      . chlorthalidone (HYGROTON) 25 MG tablet Take 25 mg by mouth daily.      . cholecalciferol (VITAMIN D) 1000 UNITS tablet Take 1,000 Units by mouth daily.      . fexofenadine (ALLEGRA) 180 MG tablet Take 180 mg by mouth daily.        . fluticasone (FLONASE) 50 MCG/ACT nasal spray Place 2 sprays into the nose daily as needed.       . lansoprazole (PREVACID) 15 MG capsule Take 15 mg by mouth daily.      Marland Kitchen levothyroxine (SYNTHROID) 100 MCG tablet Take 100 mcg by mouth daily.        . Multiple Vitamins-Minerals (CENTRUM SILVER PO) Take 1 tablet by mouth daily.       Marland Kitchen spironolactone (ALDACTONE) 25 MG tablet Take 1 tablet (25 mg total) by mouth daily.  90 tablet  1  . traMADol (ULTRAM) 50 MG tablet Take 50 mg by mouth every 6 (six) hours as needed. pain       No  current facility-administered medications for this visit.    Discontinued Meds:    Medications Discontinued During This Encounter  Medication Reason  . lansoprazole (PREVACID) 30 MG capsule Error  . chlorthalidone (HYGROTON) 25 MG tablet Error  . Cholecalciferol (VITAMIN D) 400 UNITS capsule Error    Patient Active Problem List   Diagnosis Date Noted  . Constipation 09/13/2011  . GERD 09/13/2010  . HYPOTHYROIDISM 04/28/2009  . HYPERLIPIDEMIA 04/28/2009  . Hypertension 04/28/2009  . CARDIOMYOPATHY 04/28/2009  . LEFT BUNDLE BRANCH BLOCK 04/28/2009  . DEGENERATIVE JOINT DISEASE 04/28/2009    LABS    Component Value Date/Time   NA 127* 02/12/2013 1300   NA 135 01/29/2013 1550   NA 135 01/29/2013 1550   NA 134* 11/19/2012 1400   K 4.7 02/12/2013 1300   K 3.7 01/29/2013 1550   K 3.7 01/29/2013 1550   K 4.6 11/19/2012 1400   CL 94* 02/12/2013 1300   CL 98 01/29/2013 1550  CL 98 01/29/2013 1550   CL 96 11/19/2012 1400   CO2 25 02/12/2013 1300   CO2 29 01/29/2013 1550   CO2 29 01/29/2013 1550   CO2 29 11/19/2012 1400   GLUCOSE 92 02/12/2013 1300   GLUCOSE 90 01/29/2013 1550   GLUCOSE 90 01/29/2013 1550   GLUCOSE 78 11/19/2012 1400   BUN 20 02/12/2013 1300   BUN 14 01/29/2013 1550   BUN 14 01/29/2013 1550   BUN 13 11/19/2012 1400   CREATININE 1.19* 02/12/2013 1300   CREATININE 1.08 01/29/2013 1550   CREATININE 1.08 01/29/2013 1550   CREATININE 0.89 11/19/2012 1400   CREATININE 1.07 05/31/2010 2221   CREATININE 0.99 10/04/2009   CREATININE 0.88 04/28/2009 2241   CALCIUM 9.1 02/12/2013 1300   CALCIUM 9.5 01/29/2013 1550   CALCIUM 9.5 01/29/2013 1550   CALCIUM 9.4 11/19/2012 1400   CMP     Component Value Date/Time   NA 127* 02/12/2013 1300   K 4.7 02/12/2013 1300   CL 94* 02/12/2013 1300   CO2 25 02/12/2013 1300   GLUCOSE 92 02/12/2013 1300   BUN 20 02/12/2013 1300   CREATININE 1.19* 02/12/2013 1300   CREATININE 1.07 05/31/2010 2221   CALCIUM 9.1 02/12/2013 1300   PROT 7.0 01/29/2013 1550   ALBUMIN  4.2 01/29/2013 1550   AST 20 01/29/2013 1550   ALT 15 01/29/2013 1550   ALKPHOS 55 01/29/2013 1550   BILITOT 0.4 01/29/2013 1550       Component Value Date/Time   WBC 10.5 01/29/2013 1550   WBC 7.9 05/30/2012 1138   WBC 7.5 05/31/2011 1339   HGB 13.6 01/29/2013 1550   HGB 13.0 05/30/2012 1138   HGB 12.8 05/31/2011 1339   HCT 40.4 01/29/2013 1550   HCT 38.6 05/30/2012 1138   HCT 38.9 05/31/2011 1339   MCV 88.4 01/29/2013 1550   MCV 88.7 05/30/2012 1138   MCV 92.4 05/31/2011 1339    Lipid Panel     Component Value Date/Time   CHOL 219 10/04/2009   TRIG 95 10/04/2009   HDL 42 10/04/2009   LDLCALC 158 10/04/2009    ABG No results found for this basename: phart, pco2, pco2art, po2, po2art, hco3, tco2, acidbasedef, o2sat     Lab Results  Component Value Date   TSH 4.076 05/30/2012   BNP (last 3 results) No results found for this basename: PROBNP,  in the last 8760 hours Cardiac Panel (last 3 results) No results found for this basename: CKTOTAL, CKMB, TROPONINI, RELINDX,  in the last 72 hours  Iron/TIBC/Ferritin No results found for this basename: iron, tibc, ferritin     EKG Orders placed in visit on 08/01/12  . EKG 12-LEAD     Prior Assessment and Plan Problem List as of 02/13/2013   HYPOTHYROIDISM   Last Assessment & Plan   05/31/2011 Office Visit Written 05/31/2011  1:43 PM by Kathlen Brunswick, MD     Euthyroid when last assessed earlier this year.    HYPERLIPIDEMIA   Last Assessment & Plan   05/30/2012 Office Visit Edited 05/31/2012 10:00 AM by Kathlen Brunswick, MD     Moderately elevated lipid total and LDL cholesterol in the presence of moderate cerebrovascular disease.  Treatment would be desirable; however, patient has experienced adverse effects with multiple statins.  Use of other agents is of unknown benefit.    Hypertension   Last Assessment & Plan   08/01/2012 Office Visit Written 08/01/2012  2:35 PM by Kathlen Brunswick, MD  With discontinuation of  diltiazem at her previous visit, control of hypertension is no longer optimal.  Amlodipine will be added at low dose, and patient will continue to monitor home blood pressures.    CARDIOMYOPATHY   Last Assessment & Plan   01/29/2013 Office Visit Written 02/11/2013  8:58 PM by Kathlen Brunswick, MD     Patient is modestly worse from a symptomatic standpoint. Spironolactone 25 mg per day will be added to her medical regime with close monitoring of electrolytes and renal function. Extent of CHF will be reassessed with chest x-ray, echocardiogram and basic laboratory testing.    LEFT BUNDLE BRANCH BLOCK   DEGENERATIVE JOINT DISEASE   GERD   Last Assessment & Plan   09/13/2011 Office Visit Written 09/13/2011  4:20 PM by Tiffany Kocher, PA     Typical gerd well-controlled.    Constipation   Last Assessment & Plan   05/30/2012 Office Visit Written 05/30/2012 12:42 PM by Kathlen Brunswick, MD     Patient had excellent response to linaclotide, but expense was prohibitive.        Imaging: Dg Chest 2 View  01/29/2013   *RADIOLOGY REPORT*  Clinical Data: Shortness of breath.  Congestive heart failure.  CHEST - 2 VIEW  Comparison: Two-view chest 06/05/2011.  Findings: The heart is mildly enlarged.  The left hemidiaphragm is chronically elevated.  Mild left basilar atelectasis is now present.  There is no significant edema or effusion to suggest failure.  The upper lung fields are clear.  Surgical clips are present at the gallbladder fossa.  IMPRESSION:  1.  Chronic elevation of the left hemidiaphragm with minimal left basilar atelectasis. 2.  No acute cardiopulmonary disease.   Original Report Authenticated By: Marin Roberts, M.D.

## 2013-02-13 NOTE — Assessment & Plan Note (Addendum)
No abnormalities in basic testing. No response to addition of a second diuretic although weight decreased only 1 pound.   Due to the development of hyponatremia, Aldactone dose will be reduced to 12.5 mg per day.   Objectively, exercise tolerance is relatively good, and patient did not appear to be particularly exhausted or dyspneic thereafter. Although there was a degree of chronotropic incompetence, I doubt this accounts for her symptoms. We will seek other causes by performing a CT scan of the chest.  A sleep study was also advised, but patient demurred.

## 2013-02-13 NOTE — Assessment & Plan Note (Signed)
Blood pressure control is excellent. Current medications will be continued.

## 2013-02-14 ENCOUNTER — Encounter: Payer: Self-pay | Admitting: Cardiology

## 2013-02-19 ENCOUNTER — Other Ambulatory Visit: Payer: Self-pay | Admitting: *Deleted

## 2013-02-19 ENCOUNTER — Encounter: Payer: Self-pay | Admitting: *Deleted

## 2013-02-19 DIAGNOSIS — I429 Cardiomyopathy, unspecified: Secondary | ICD-10-CM

## 2013-02-19 DIAGNOSIS — I10 Essential (primary) hypertension: Secondary | ICD-10-CM | POA: Diagnosis not present

## 2013-02-19 DIAGNOSIS — R0602 Shortness of breath: Secondary | ICD-10-CM

## 2013-02-19 DIAGNOSIS — I447 Left bundle-branch block, unspecified: Secondary | ICD-10-CM | POA: Diagnosis not present

## 2013-02-19 DIAGNOSIS — I5040 Unspecified combined systolic (congestive) and diastolic (congestive) heart failure: Secondary | ICD-10-CM | POA: Diagnosis not present

## 2013-02-19 DIAGNOSIS — I428 Other cardiomyopathies: Secondary | ICD-10-CM | POA: Diagnosis not present

## 2013-02-20 ENCOUNTER — Other Ambulatory Visit: Payer: Self-pay | Admitting: Cardiology

## 2013-02-24 DIAGNOSIS — I447 Left bundle-branch block, unspecified: Secondary | ICD-10-CM | POA: Diagnosis not present

## 2013-02-24 DIAGNOSIS — I10 Essential (primary) hypertension: Secondary | ICD-10-CM | POA: Diagnosis not present

## 2013-02-24 DIAGNOSIS — I428 Other cardiomyopathies: Secondary | ICD-10-CM | POA: Diagnosis not present

## 2013-02-24 DIAGNOSIS — I5040 Unspecified combined systolic (congestive) and diastolic (congestive) heart failure: Secondary | ICD-10-CM | POA: Diagnosis not present

## 2013-02-24 DIAGNOSIS — R0602 Shortness of breath: Secondary | ICD-10-CM | POA: Diagnosis not present

## 2013-02-25 LAB — BASIC METABOLIC PANEL
CO2: 27 mEq/L (ref 19–32)
Calcium: 9.5 mg/dL (ref 8.4–10.5)
Creat: 1.17 mg/dL — ABNORMAL HIGH (ref 0.50–1.10)
Glucose, Bld: 96 mg/dL (ref 70–99)

## 2013-03-04 ENCOUNTER — Encounter: Payer: Self-pay | Admitting: Cardiology

## 2013-03-04 DIAGNOSIS — E871 Hypo-osmolality and hyponatremia: Secondary | ICD-10-CM | POA: Insufficient documentation

## 2013-03-04 MED ORDER — SPIRONOLACTONE 25 MG PO TABS: 12.5000 mg | ORAL_TABLET | ORAL | Status: DC

## 2013-03-04 NOTE — Addendum Note (Signed)
Addended by: Derry Lory A on: 03/04/2013 05:52 PM   Modules accepted: Orders

## 2013-03-23 ENCOUNTER — Other Ambulatory Visit: Payer: Self-pay | Admitting: Cardiology

## 2013-04-09 ENCOUNTER — Encounter: Payer: Self-pay | Admitting: *Deleted

## 2013-04-09 ENCOUNTER — Other Ambulatory Visit: Payer: Self-pay | Admitting: *Deleted

## 2013-04-09 DIAGNOSIS — I447 Left bundle-branch block, unspecified: Secondary | ICD-10-CM

## 2013-04-09 DIAGNOSIS — R0602 Shortness of breath: Secondary | ICD-10-CM

## 2013-04-09 DIAGNOSIS — I5040 Unspecified combined systolic (congestive) and diastolic (congestive) heart failure: Secondary | ICD-10-CM

## 2013-04-09 DIAGNOSIS — I429 Cardiomyopathy, unspecified: Secondary | ICD-10-CM

## 2013-04-09 DIAGNOSIS — I10 Essential (primary) hypertension: Secondary | ICD-10-CM

## 2013-04-15 DIAGNOSIS — I10 Essential (primary) hypertension: Secondary | ICD-10-CM | POA: Diagnosis not present

## 2013-04-15 DIAGNOSIS — I447 Left bundle-branch block, unspecified: Secondary | ICD-10-CM | POA: Diagnosis not present

## 2013-04-15 DIAGNOSIS — R0602 Shortness of breath: Secondary | ICD-10-CM | POA: Diagnosis not present

## 2013-04-15 DIAGNOSIS — I5040 Unspecified combined systolic (congestive) and diastolic (congestive) heart failure: Secondary | ICD-10-CM | POA: Diagnosis not present

## 2013-04-15 DIAGNOSIS — I428 Other cardiomyopathies: Secondary | ICD-10-CM | POA: Diagnosis not present

## 2013-04-15 LAB — BASIC METABOLIC PANEL
CO2: 29 mEq/L (ref 19–32)
Calcium: 9.2 mg/dL (ref 8.4–10.5)
Creat: 1.04 mg/dL (ref 0.50–1.10)

## 2013-04-25 ENCOUNTER — Other Ambulatory Visit: Payer: Self-pay | Admitting: *Deleted

## 2013-04-25 MED ORDER — AMLODIPINE BESYLATE 2.5 MG PO TABS: 2.5000 mg | ORAL_TABLET | Freq: Every day | ORAL | Status: DC

## 2013-05-05 DIAGNOSIS — M1990 Unspecified osteoarthritis, unspecified site: Secondary | ICD-10-CM | POA: Diagnosis not present

## 2013-05-05 DIAGNOSIS — I10 Essential (primary) hypertension: Secondary | ICD-10-CM | POA: Diagnosis not present

## 2013-05-05 DIAGNOSIS — I509 Heart failure, unspecified: Secondary | ICD-10-CM | POA: Diagnosis not present

## 2013-05-05 DIAGNOSIS — M25559 Pain in unspecified hip: Secondary | ICD-10-CM | POA: Diagnosis not present

## 2013-05-05 DIAGNOSIS — Z23 Encounter for immunization: Secondary | ICD-10-CM | POA: Diagnosis not present

## 2013-05-23 ENCOUNTER — Other Ambulatory Visit: Payer: Self-pay

## 2013-05-23 ENCOUNTER — Other Ambulatory Visit: Payer: Self-pay | Admitting: Cardiology

## 2013-07-21 ENCOUNTER — Encounter: Payer: Self-pay | Admitting: Family Medicine

## 2013-07-21 DIAGNOSIS — Z85850 Personal history of malignant neoplasm of thyroid: Secondary | ICD-10-CM | POA: Diagnosis not present

## 2013-07-21 DIAGNOSIS — N63 Unspecified lump in unspecified breast: Secondary | ICD-10-CM | POA: Diagnosis not present

## 2013-07-27 ENCOUNTER — Emergency Department (HOSPITAL_COMMUNITY)
Admission: EM | Admit: 2013-07-27 | Discharge: 2013-07-27 | Disposition: A | Discharge status: Home / Self Care | Payer: Medicare Other | Attending: Emergency Medicine | Admitting: Emergency Medicine

## 2013-07-27 ENCOUNTER — Emergency Department (HOSPITAL_COMMUNITY): Payer: Medicare Other

## 2013-07-27 ENCOUNTER — Encounter (HOSPITAL_COMMUNITY): Payer: Self-pay | Admitting: Emergency Medicine

## 2013-07-27 DIAGNOSIS — R4789 Other speech disturbances: Secondary | ICD-10-CM | POA: Insufficient documentation

## 2013-07-27 DIAGNOSIS — Z95818 Presence of other cardiac implants and grafts: Secondary | ICD-10-CM | POA: Diagnosis not present

## 2013-07-27 DIAGNOSIS — Z9889 Other specified postprocedural states: Secondary | ICD-10-CM | POA: Diagnosis not present

## 2013-07-27 DIAGNOSIS — Z9089 Acquired absence of other organs: Secondary | ICD-10-CM | POA: Diagnosis not present

## 2013-07-27 DIAGNOSIS — E039 Hypothyroidism, unspecified: Secondary | ICD-10-CM | POA: Diagnosis not present

## 2013-07-27 DIAGNOSIS — M1610 Unilateral primary osteoarthritis, unspecified hip: Secondary | ICD-10-CM | POA: Diagnosis not present

## 2013-07-27 DIAGNOSIS — K219 Gastro-esophageal reflux disease without esophagitis: Secondary | ICD-10-CM | POA: Insufficient documentation

## 2013-07-27 DIAGNOSIS — M1710 Unilateral primary osteoarthritis, unspecified knee: Secondary | ICD-10-CM | POA: Insufficient documentation

## 2013-07-27 DIAGNOSIS — I10 Essential (primary) hypertension: Secondary | ICD-10-CM | POA: Diagnosis not present

## 2013-07-27 DIAGNOSIS — M169 Osteoarthritis of hip, unspecified: Secondary | ICD-10-CM | POA: Diagnosis not present

## 2013-07-27 DIAGNOSIS — Z7982 Long term (current) use of aspirin: Secondary | ICD-10-CM | POA: Insufficient documentation

## 2013-07-27 DIAGNOSIS — Z95810 Presence of automatic (implantable) cardiac defibrillator: Secondary | ICD-10-CM | POA: Insufficient documentation

## 2013-07-27 DIAGNOSIS — Z85850 Personal history of malignant neoplasm of thyroid: Secondary | ICD-10-CM | POA: Diagnosis not present

## 2013-07-27 DIAGNOSIS — E785 Hyperlipidemia, unspecified: Secondary | ICD-10-CM | POA: Insufficient documentation

## 2013-07-27 DIAGNOSIS — Z8673 Personal history of transient ischemic attack (TIA), and cerebral infarction without residual deficits: Secondary | ICD-10-CM | POA: Insufficient documentation

## 2013-07-27 DIAGNOSIS — IMO0002 Reserved for concepts with insufficient information to code with codable children: Secondary | ICD-10-CM | POA: Diagnosis not present

## 2013-07-27 DIAGNOSIS — R209 Unspecified disturbances of skin sensation: Secondary | ICD-10-CM | POA: Insufficient documentation

## 2013-07-27 DIAGNOSIS — Z79899 Other long term (current) drug therapy: Secondary | ICD-10-CM | POA: Diagnosis not present

## 2013-07-27 DIAGNOSIS — R29818 Other symptoms and signs involving the nervous system: Secondary | ICD-10-CM | POA: Diagnosis not present

## 2013-07-27 DIAGNOSIS — R202 Paresthesia of skin: Secondary | ICD-10-CM

## 2013-07-27 DIAGNOSIS — R200 Anesthesia of skin: Secondary | ICD-10-CM

## 2013-07-27 DIAGNOSIS — R9431 Abnormal electrocardiogram [ECG] [EKG]: Secondary | ICD-10-CM | POA: Diagnosis not present

## 2013-07-27 LAB — GLUCOSE, CAPILLARY: GLUCOSE-CAPILLARY: 107 mg/dL — AB (ref 70–99)

## 2013-07-27 NOTE — ED Provider Notes (Signed)
CSN: [id number]     Arrival date & time 07/27/13  34 History   First MD Initiated Contact with Patient 07/27/13 1713     Chief Complaint  Patient presents with  . Numbness   (Consider location/radiation/quality/duration/timing/severity/associated sxs/prior Treatment) HPI... left facial numbness, left hand numbness, slurred speech for brief period of time approximately 4 PM today. Speech has improved. Slight residual left cheek numbness and numbness to left  digits 1,2,3.  Son says she appears normal. No other neurological deficits. Patient has had a viral syndrome recently including sinus congestion.  Past Medical History  Diagnosis Date  . Hyperlipemia   . Hypertension   . Degenerative joint disease     Right hip and knee  . Hypothyroid   . 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 GERD (gastroesophageal reflux disease)   . S/P colonoscopy 2009    Dr. Sharlett Iles: reportedly normal per pt  . S/P endoscopy 1980s    per pt; normal. performed secondary to reflux  . Cerebrovascular accident     No clinical event; asymptomatic CT finding   Past Surgical History  Procedure Laterality Date  . Thyroidectomy      neoplasm  . Cholecystectomy    . Appendectomy    . Abdominal hysterectomy    . Breast biopsy      bilaterally; benign  . Mandible surgery      secondary to malocclusion  . Ureteral surgery  1970  . Colonoscopy  2009  . Esophagogastroduodenoscopy  07/13/2011    small hh, patchy gastric erythema with scarring deformity of antrum, chronic gastritis, bx benign with no H.Pylori. Procedure: ESOPHAGOGASTRODUODENOSCOPY (EGD);  Surgeon: Daneil Dolin, MD;  Location: [facility];  Service: Endoscopy;  Laterality: N/A;  10:45  . Can not have mri due to jaw surgery     Family History  Problem Relation Age of Onset  . Brain cancer Mother   . Atrial fibrillation Other   . Colon  cancer Neg Hx    History  Substance Use Topics  . Smoking status: Never Smoker   . Smokeless tobacco: Never Used  . Alcohol Use: No   OB History   Grav Para Term Preterm Abortions TAB SAB Ect Mult Living                 Review of Systems  All other systems reviewed and are negative.    Allergies  Dexlansoprazole and Statins  Home Medications   Current Outpatient Rx  Name  Route  Sig  Dispense  Refill  . amLODipine (NORVASC) 2.5 MG tablet   Oral   Take 2.5 mg by mouth every morning.         Marland Kitchen aspirin EC 81 MG tablet   Oral   Take 81 mg by mouth every morning.          . benazepril (LOTENSIN) 40 MG tablet   Oral   Take 40 mg by mouth every morning.         . carvedilol (COREG) 25 MG tablet   Oral   Take 12.5-25 mg by mouth 2 (two) times daily. Takes one-half tablet in the morning and one tablet in the evening/at bedtime         . chlorthalidone (HYGROTON) 25 MG tablet   Oral   Take 25 mg by mouth every morning.          Marland Kitchen  cholecalciferol (VITAMIN D) 1000 UNITS tablet   Oral   Take 1,000 Units by mouth daily.         . fexofenadine (ALLEGRA) 180 MG tablet   Oral   Take 180 mg by mouth every morning.          . fluticasone (FLONASE) 50 MCG/ACT nasal spray   Nasal   Place 2 sprays into the nose daily as needed.          . lansoprazole (PREVACID) 15 MG capsule   Oral   Take 15 mg by mouth every morning.          Marland Kitchen levothyroxine (SYNTHROID) 100 MCG tablet   Oral   Take 100 mcg by mouth every morning.          . Multiple Vitamins-Minerals (CENTRUM SILVER PO)   Oral   Take 1 tablet by mouth daily.          Marland Kitchen spironolactone (ALDACTONE) 25 MG tablet   Oral   Take 12.5 mg by mouth every other day.         . traMADol (ULTRAM) 50 MG tablet   Oral   Take 50 mg by mouth every 6 (six) hours as needed. pain          BP 134/70  Pulse 76  Temp(Src) 97.7 F (36.5 C) (Oral)  Resp 17  Ht 5\' 7"  (1.702 m)  Wt 220 lb (99.791 kg)   BMI 34.45 kg/m2  SpO2 95% Physical Exam  Nursing note and vitals reviewed. Constitutional: She is oriented to person, place, and time. She appears well-developed and well-nourished.  HENT:  Head: Normocephalic and atraumatic.  Eyes: Conjunctivae and EOM are normal. Pupils are equal, round, and reactive to light.  Neck: Normal range of motion. Neck supple.  Cardiovascular: Normal rate, regular rhythm and normal heart sounds.   Pulmonary/Chest: Effort normal and breath sounds normal.  Abdominal: Soft. Bowel sounds are normal.  Musculoskeletal: Normal range of motion.  Neurological: She is alert and oriented to person, place, and time.  Motor, sensory, cerebellar neuro exam grossly normal.  Skin: Skin is warm and dry.  Psychiatric: She has a normal mood and affect. Her behavior is normal.    ED Course  Procedures (including critical care time) Labs Review Labs Reviewed  GLUCOSE, CAPILLARY - Abnormal; Notable for the following:    Glucose-Capillary 107 (*)    All other components within normal limits   Imaging Review Ct Head Wo Contrast  07/27/2013   CLINICAL DATA:  Left facial and left arm weakness  EXAM: CT HEAD WITHOUT CONTRAST  TECHNIQUE: Contiguous axial images were obtained from the base of the skull through the vertex without intravenous contrast.  COMPARISON:  11/30/2006  FINDINGS: No skull fracture is noted. Paranasal sinuses and mastoid air cells are unremarkable.  No intracranial hemorrhage, mass effect or midline shift. Ventricular size is stable from prior exam. Stable patchy area of decreased attenuation in right frontoparietal white matter. No definite acute cortical infarction. No mass lesion is noted on this unenhanced scan.  IMPRESSION: No acute intracranial abnormality. Stable chronic findings as described above. No definite acute cortical infarction. If there is high clinical suspicious for acute infarction follow-up MRI with diffusion imaging is recommended.    Electronically Signed   By: Lahoma Crocker M.D.   On: 07/27/2013 18:25    EKG Interpretation    Date/Time:  Sunday July 27 2013 17:10:05 EST Ventricular Rate:  79 PR Interval:  232 QRS  Duration: 170 QT Interval:  428 QTC Calculation: 490 R Axis:   21 Text Interpretation:  Sinus rhythm with 1st degree A-V block Left bundle branch block Abnormal ECG No previous ECGs available Confirmed by Peighton Edgin  MD, Minyon Billiter ([id number]) on 07/27/2013 6:16:14 PM            MDM   1. Left facial numbness   2. Numbness and tingling in left hand    History and physical suggest a TIA. This was discussed with the patient and her son. She has primary care followup. She is comfortable with outpatient workup.    Nat Christen, MD 07/27/13 [phone]

## 2013-07-27 NOTE — Discharge Instructions (Signed)
CT scan of head was normal. Recommend baby aspirin daily. Followup your primary care Dr. this week.

## 2013-07-27 NOTE — ED Notes (Signed)
Patient transported to CT 

## 2013-07-27 NOTE — ED Notes (Signed)
Pt reports woke up from a nap approx 1 hour ago and had numbness to left side of face and left hand.  Pt says left leg feels weak but that symptoms is not new.  Denies headache.  Pt says when she first woke up her speech was slurred for " a little while."  Pt says took 2 baby asa.  Face symmetrical at this time.  Speech normal.

## 2013-08-04 ENCOUNTER — Other Ambulatory Visit (HOSPITAL_COMMUNITY): Payer: Self-pay | Admitting: Pulmonary Disease

## 2013-08-04 DIAGNOSIS — G459 Transient cerebral ischemic attack, unspecified: Secondary | ICD-10-CM

## 2013-08-04 DIAGNOSIS — I509 Heart failure, unspecified: Secondary | ICD-10-CM | POA: Diagnosis not present

## 2013-08-04 DIAGNOSIS — I10 Essential (primary) hypertension: Secondary | ICD-10-CM | POA: Diagnosis not present

## 2013-08-04 DIAGNOSIS — E785 Hyperlipidemia, unspecified: Secondary | ICD-10-CM | POA: Diagnosis not present

## 2013-08-05 ENCOUNTER — Ambulatory Visit (HOSPITAL_COMMUNITY)
Admission: RE | Admit: 2013-08-05 | Discharge: 2013-08-05 | Disposition: A | Discharge status: Home / Self Care | Payer: Medicare Other | Source: Ambulatory Visit | Attending: Pulmonary Disease | Admitting: Pulmonary Disease

## 2013-08-05 DIAGNOSIS — G459 Transient cerebral ischemic attack, unspecified: Secondary | ICD-10-CM

## 2013-08-05 DIAGNOSIS — I6529 Occlusion and stenosis of unspecified carotid artery: Secondary | ICD-10-CM | POA: Insufficient documentation

## 2013-08-12 ENCOUNTER — Ambulatory Visit (INDEPENDENT_AMBULATORY_CARE_PROVIDER_SITE_OTHER): Payer: Medicare Other | Admitting: Cardiovascular Disease

## 2013-08-12 ENCOUNTER — Encounter: Payer: Self-pay | Admitting: Cardiovascular Disease

## 2013-08-12 VITALS — BP 142/67 | HR 69 | Ht 67.0 in | Wt 235.5 lb

## 2013-08-12 DIAGNOSIS — I429 Cardiomyopathy, unspecified: Secondary | ICD-10-CM

## 2013-08-12 DIAGNOSIS — I10 Essential (primary) hypertension: Secondary | ICD-10-CM | POA: Diagnosis not present

## 2013-08-12 DIAGNOSIS — I428 Other cardiomyopathies: Secondary | ICD-10-CM | POA: Diagnosis not present

## 2013-08-12 DIAGNOSIS — I447 Left bundle-branch block, unspecified: Secondary | ICD-10-CM

## 2013-08-12 NOTE — Progress Notes (Signed)
Patient ID: [name], female   DOB: Dec 20, 1940, 73 y.o.   MRN: [id number]      SUBJECTIVE: The patient is here for routine cardiovascular followup. She has a history of a nonischemic myopathy, hypertension, and hyperlipidemia. She underwent a chest CT in August 2014 which showed no acute abnormalities. An echocardiogram in July 2014 revealed an ejection fraction of 30%. She very seldom has chest pain related to indigestion. She has chronic dyspnea with exertion but this is no worse from her baseline. She denies lightheadedness, dizziness and syncope. She also denies palpitations and leg swelling. She currently has a cold and doesn't feel well. She is on a reduced dose of spironolactone reportedly due to episodes of hyponatremia in the past. An ECG performed earlier this month shows sinus rhythm with a left bundle branch block and first-degree AV block with a PR interval of 232 ms. The QRS duration is 170 ms.  Allergies  Allergen Reactions  . Dexlansoprazole     Lower abdominal pain.  . Statins Other (See Comments)    Arthralgias during treatment with at least 2 members of his class     Current Outpatient Prescriptions  Medication Sig Dispense Refill  . amLODipine (NORVASC) 2.5 MG tablet Take 2.5 mg by mouth every morning.      Marland Kitchen aspirin EC 81 MG tablet Take 81 mg by mouth every morning.       . benazepril (LOTENSIN) 40 MG tablet Take 40 mg by mouth every morning.      . carvedilol (COREG) 25 MG tablet Take 12.5-25 mg by mouth 2 (two) times daily. Takes one-half tablet in the morning and one tablet in the evening/at bedtime      . chlorthalidone (HYGROTON) 25 MG tablet Take 25 mg by mouth every morning.       . cholecalciferol (VITAMIN D) 1000 UNITS tablet Take 1,000 Units by mouth daily.      . ciprofloxacin (CIPRO) 500 MG tablet Take 500 mg by mouth 2 (two) times daily.       . fexofenadine (ALLEGRA) 180 MG tablet Take 180 mg by mouth every morning.       . fluticasone (FLONASE) 50  MCG/ACT nasal spray Place 2 sprays into the nose daily as needed.       . lansoprazole (PREVACID) 15 MG capsule Take 15 mg by mouth every morning.       Marland Kitchen levothyroxine (SYNTHROID) 100 MCG tablet Take 100 mcg by mouth every morning.       . Multiple Vitamins-Minerals (CENTRUM SILVER PO) Take 1 tablet by mouth daily.       Marland Kitchen spironolactone (ALDACTONE) 25 MG tablet Take 12.5 mg by mouth every other day.      . traMADol (ULTRAM) 50 MG tablet Take 50 mg by mouth every 6 (six) hours as needed. pain       No current facility-administered medications for this visit.    Past Medical History  Diagnosis Date  . Hyperlipemia   . Hypertension   . Degenerative joint disease     Right hip and knee  . Hypothyroid   . 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 GERD (gastroesophageal reflux disease)   . S/P colonoscopy 2009    Dr. Sharlett Iles: reportedly normal per pt  . S/P endoscopy 1980s    per pt; normal. performed secondary to reflux  . Cerebrovascular accident  No clinical event; asymptomatic CT finding    Past Surgical History  Procedure Laterality Date  . Thyroidectomy      neoplasm  . Cholecystectomy    . Appendectomy    . Abdominal hysterectomy    . Breast biopsy      bilaterally; benign  . Mandible surgery      secondary to malocclusion  . Ureteral surgery  1970  . Colonoscopy  2009  . Esophagogastroduodenoscopy  07/13/2011    small hh, patchy gastric erythema with scarring deformity of antrum, chronic gastritis, bx benign with no H.Pylori. Procedure: ESOPHAGOGASTRODUODENOSCOPY (EGD);  Surgeon: Daneil Dolin, MD;  Location: [facility];  Service: Endoscopy;  Laterality: N/A;  10:45  . Can not have mri due to jaw surgery      History   Social History  . Marital Status: Widowed    Spouse Name: N/A    Number of Children: 2  . Years of Education: N/A   Occupational  History  . Not on file.   Social History Main Topics  . Smoking status: Never Smoker   . Smokeless tobacco: Never Used  . Alcohol Use: No  . Drug Use: No  . Sexual Activity: Not on file   Other Topics Concern  . Not on file   Social History Narrative   Widowed           Filed Vitals:   08/12/13 0956  BP: 142/67  Pulse: 69  Height: 5\' 7"  (1.702 m)  Weight: 235 lb 8 oz (106.822 kg)    PHYSICAL EXAM General: NAD Neck: No JVD, no thyromegaly or thyroid nodule.  Lungs: Clear to auscultation bilaterally with normal respiratory effort. CV: Nondisplaced PMI.  Heart regular S1/S2, no S3/S4, no murmur.  No peripheral edema.  No carotid bruit.  Normal pedal pulses.  Abdomen: Soft, nontender, no hepatosplenomegaly, no distention.  Neurologic: Alert and oriented x 3.  Psych: Normal affect. Extremities: No clubbing or cyanosis.   ECG: reviewed and available in electronic records.  Echo (01/2013): - Left ventricle: The cavity size was at the upper limits of normal. Wall thickness was normal. Systolic function was moderately to severely reduced. The estimated ejection fraction was 30%. Moderate global hypokinesis with a dyssynchronous pattern of contraction. - Ventricular septum: Septal motion showed paradox. - Mitral valve: Calcified annulus. - Left atrium: The atrium was mildly to moderately dilated. - Atrial septum: No defect or patent foramen ovale was identified. - Pulmonary arteries: PA peak pressure: 49mm Hg (S).      ASSESSMENT AND PLAN: 1. Nonischemic cardiomyopathy: she appears to be euvolemic and compensated. Given her LBBB and EF of 30%, I had a long discussion with her with regards to consideration for BiV-ICD, and she is willing to meet with one of our electrophysiologists to discuss this procedure further. Continue current doses of benazepril, carvedilol, spironolactone, and chlorthalidone. 2. HTN: mildly elevated today. No medication adjustments.  Dispo:  will make appt with EP and schedule f/u with me in 6 months.  Kate Sable, M.D., F.A.C.C.

## 2013-08-12 NOTE — Patient Instructions (Addendum)
Your physician wants you to follow-up in: 6 months  You will receive a reminder letter in the mail two months in advance. If you don't receive a letter, please call our office to schedule the follow-up appointment.  Your physician recommends that you continue on your current medications as directed. Please refer to the Current Medication list given to you today.  

## 2013-08-25 ENCOUNTER — Telehealth: Payer: Self-pay | Admitting: Cardiovascular Disease

## 2013-08-25 ENCOUNTER — Other Ambulatory Visit: Payer: Self-pay | Admitting: *Deleted

## 2013-08-25 DIAGNOSIS — L819 Disorder of pigmentation, unspecified: Secondary | ICD-10-CM | POA: Diagnosis not present

## 2013-08-25 DIAGNOSIS — C44721 Squamous cell carcinoma of skin of unspecified lower limb, including hip: Secondary | ICD-10-CM | POA: Diagnosis not present

## 2013-08-25 DIAGNOSIS — D235 Other benign neoplasm of skin of trunk: Secondary | ICD-10-CM | POA: Diagnosis not present

## 2013-08-25 DIAGNOSIS — L570 Actinic keratosis: Secondary | ICD-10-CM | POA: Diagnosis not present

## 2013-08-25 DIAGNOSIS — D485 Neoplasm of uncertain behavior of skin: Secondary | ICD-10-CM | POA: Diagnosis not present

## 2013-08-25 MED ORDER — CARVEDILOL 25 MG PO TABS: ORAL_TABLET | ORAL | Status: DC

## 2013-08-25 MED ORDER — CARVEDILOL 25 MG PO TABS: 12.5000 mg | ORAL_TABLET | Freq: Two times a day (BID) | ORAL | Status: DC

## 2013-08-25 NOTE — Telephone Encounter (Signed)
Medication sent via escribe.  

## 2013-08-25 NOTE — Telephone Encounter (Signed)
Received fax refill request  Rx # [id number] Medication:  Carvedilol 25 mg tab Qty 60 Sig:  Take one and one half tablets by mouth once daily Physician:  Bronson Ing

## 2013-09-11 ENCOUNTER — Ambulatory Visit: Payer: Medicare Other | Admitting: Internal Medicine

## 2013-09-18 ENCOUNTER — Other Ambulatory Visit: Payer: Self-pay | Admitting: Adult Health

## 2013-09-23 DIAGNOSIS — M25519 Pain in unspecified shoulder: Secondary | ICD-10-CM | POA: Diagnosis not present

## 2013-10-13 ENCOUNTER — Ambulatory Visit (INDEPENDENT_AMBULATORY_CARE_PROVIDER_SITE_OTHER): Payer: Medicare Other | Admitting: Internal Medicine

## 2013-10-13 ENCOUNTER — Encounter: Payer: Self-pay | Admitting: Internal Medicine

## 2013-10-13 VITALS — BP 136/70 | HR 71 | Ht 67.0 in | Wt 229.0 lb

## 2013-10-13 DIAGNOSIS — I10 Essential (primary) hypertension: Secondary | ICD-10-CM

## 2013-10-13 DIAGNOSIS — I5022 Chronic systolic (congestive) heart failure: Secondary | ICD-10-CM | POA: Diagnosis not present

## 2013-10-13 NOTE — Progress Notes (Signed)
HPI [name] is referred today by Dr. Raliegh Ip for evaluation of and consideration for a BiV ICD. She is a pleasant 73 yo woman with a h/o non-ischemic CM, worsening CHF, despite maximal medical therapy and LBBB. Over the past few months she has had increasing fatigue, weakness and sob. She used to be very active, with class 1 symptoms which have progressed to class 3. She has not had syncope but does note a TIA which occurred 2 months ago. The etiology was unclear. She denies peripheral edema. No syncope.  Allergies  Allergen Reactions  . Dexlansoprazole     Lower abdominal pain.  . Statins Other (See Comments)    Arthralgias during treatment with at least 2 members of his class      Current Outpatient Prescriptions  Medication Sig Dispense Refill  . amLODipine (NORVASC) 2.5 MG tablet Take 2.5 mg by mouth every morning.      Marland Kitchen aspirin EC 81 MG tablet Take 81 mg by mouth every morning.       . benazepril (LOTENSIN) 40 MG tablet Take 40 mg by mouth every morning.      . benazepril (LOTENSIN) 40 MG tablet TAKE ONE TABLET BY MOUTH ONCE DAILY  90 tablet  3  . carvedilol (COREG) 25 MG tablet Takes one-half tablet in the morning and one tablet in the evening/at bedtime  60 tablet  6  . chlorthalidone (HYGROTON) 25 MG tablet Take 25 mg by mouth every morning.       . chlorthalidone (HYGROTON) 25 MG tablet TAKE ONE TABLET BY MOUTH ONCE DAILY  90 tablet  3  . cholecalciferol (VITAMIN D) 1000 UNITS tablet Take 1,000 Units by mouth daily.      . fexofenadine (ALLEGRA) 180 MG tablet Take 180 mg by mouth every morning.       . fluticasone (FLONASE) 50 MCG/ACT nasal spray Place 2 sprays into the nose daily as needed.       . lansoprazole (PREVACID) 15 MG capsule Take 15 mg by mouth every morning.       Marland Kitchen levothyroxine (SYNTHROID) 100 MCG tablet Take 100 mcg by mouth every morning.       . Multiple Vitamins-Minerals (CENTRUM SILVER PO) Take 1 tablet by mouth daily.       Marland Kitchen spironolactone (ALDACTONE)  25 MG tablet Take 12.5 mg by mouth every other day.      . traMADol (ULTRAM) 50 MG tablet Take 50 mg by mouth every 6 (six) hours as needed. pain       No current facility-administered medications for this visit.     Past Medical History  Diagnosis Date  . Hyperlipemia   . Hypertension   . Degenerative joint disease     Right hip and knee  . Hypothyroid   . 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 GERD (gastroesophageal reflux disease)   . S/P colonoscopy 2009    Dr. Sharlett Iles: reportedly normal per pt  . S/P endoscopy 1980s    per pt; normal. performed secondary to reflux  . Cerebrovascular accident     No clinical event; asymptomatic CT finding    ROS:   All systems reviewed and negative except as noted in the HPI.   Past Surgical History  Procedure Laterality Date  . Thyroidectomy      neoplasm  . Cholecystectomy    .  Appendectomy    . Abdominal hysterectomy    . Breast biopsy      bilaterally; benign  . Mandible surgery      secondary to malocclusion  . Ureteral surgery  1970  . Colonoscopy  2009  . Esophagogastroduodenoscopy  07/13/2011    small hh, patchy gastric erythema with scarring deformity of antrum, chronic gastritis, bx benign with no H.Pylori. Procedure: ESOPHAGOGASTRODUODENOSCOPY (EGD);  Surgeon: Daneil Dolin, MD;  Location: [facility];  Service: Endoscopy;  Laterality: N/A;  10:45  . Can not have mri due to jaw surgery       Family History  Problem Relation Age of Onset  . Brain cancer Mother   . Atrial fibrillation Other   . Colon cancer Neg Hx      History   Social History  . Marital Status: Widowed    Spouse Name: N/A    Number of Children: 2  . Years of Education: N/A   Occupational History  . Not on file.   Social History Main Topics  . Smoking status: Never Smoker   . Smokeless tobacco: Never Used  . Alcohol Use: No    . Drug Use: No  . Sexual Activity: Not on file   Other Topics Concern  . Not on file   Social History Narrative   Widowed           BP 136/70  Pulse 71  Ht 5\' 7"  (1.702 m)  Wt 229 lb (103.874 kg)  BMI 35.86 kg/m2  Physical Exam:  Well appearing 73 yo woman, NAD HEENT: Unremarkable Neck:  No JVD, no thyromegally Back:  No CVA tenderness Lungs:  Clear with no wheezes, rales, or rhonchi HEART:  Regular rate rhythm, no murmurs, no rubs, no clicks Abd:  soft, positive bowel sounds, no organomegally, no rebound, no guarding Ext:  2 plus pulses, no edema, no cyanosis, no clubbing Skin:  No rashes no nodules Neuro:  CN II through XII intact, motor grossly intact  EKG - nsr with lbbb  DEVICE  Normal device function.  See PaceArt for details.   Assess/Plan:

## 2013-10-13 NOTE — Assessment & Plan Note (Signed)
Her symptoms are class 3 and her EF has been 35% by echo for 7 years. 9 months ago her echo demonstrated an EF of 30%. She is on maximal medical therapy with an ACE inhibitor, beta blocker, diuretic and aldactone. I have discussed the treatment options with the patient in detail. The risks/benefits/goals/expectations of ICD Implant have been reviewed with the patient and her daughter in law and they would like to reflect. She will call us if she would like to proceed. She will continue her current meds.

## 2013-10-13 NOTE — Patient Instructions (Signed)
Your physician recommends that you schedule a follow-up appointment in: 3 months with Dr Lovena Le after Pacemaker procedure  Dates available: April 13th, 17th, 20th, 22nd, 24th.  Please call Janan Halter RN at the [place] office to set up the time. [phone]

## 2013-10-13 NOTE — Assessment & Plan Note (Signed)
Her blood pressure is slightly elevated. She will continue her current meds.

## 2013-10-14 ENCOUNTER — Other Ambulatory Visit: Payer: Self-pay | Admitting: Adult Health

## 2013-10-16 ENCOUNTER — Telehealth: Payer: Self-pay | Admitting: Internal Medicine

## 2013-10-16 ENCOUNTER — Encounter: Payer: Self-pay | Admitting: *Deleted

## 2013-10-16 NOTE — Telephone Encounter (Signed)
New message         Pt would like kelly to give her a call at her earliest convenience

## 2013-10-16 NOTE — Telephone Encounter (Signed)
Pt would like to talk to dr at earliest convenience. Dr Lovena Le wanted to do a pacer on her, pt would like to talk to dr first

## 2013-10-16 NOTE — Telephone Encounter (Signed)
Spoke with patient and have her scheduled for 10/31/13

## 2013-10-20 ENCOUNTER — Telehealth: Payer: Self-pay | Admitting: Cardiovascular Disease

## 2013-10-20 NOTE — Telephone Encounter (Signed)
Patient is aware he is out of the office until Wednesday and is working in the hospital then.  She states she will wait to here from him when he is not busy.

## 2013-10-23 NOTE — Telephone Encounter (Signed)
Pt is scheduled on 4-17 for pacemaker insertion with Dr Lovena Le.

## 2013-10-23 NOTE — Telephone Encounter (Signed)
Spoke with pt today. She plans to proceed with BiV-ICD with Dr. Lovena Le.

## 2013-10-24 ENCOUNTER — Ambulatory Visit (INDEPENDENT_AMBULATORY_CARE_PROVIDER_SITE_OTHER): Payer: Medicare Other | Admitting: *Deleted

## 2013-10-24 DIAGNOSIS — I428 Other cardiomyopathies: Secondary | ICD-10-CM

## 2013-10-24 DIAGNOSIS — E785 Hyperlipidemia, unspecified: Secondary | ICD-10-CM

## 2013-10-24 DIAGNOSIS — I10 Essential (primary) hypertension: Secondary | ICD-10-CM

## 2013-10-24 DIAGNOSIS — I447 Left bundle-branch block, unspecified: Secondary | ICD-10-CM

## 2013-10-24 LAB — CBC WITH DIFFERENTIAL/PLATELET
BASOS ABS: 0 10*3/uL (ref 0.0–0.1)
BASOS PCT: 0.6 % (ref 0.0–3.0)
EOS ABS: 0.2 10*3/uL (ref 0.0–0.7)
Eosinophils Relative: 2 % (ref 0.0–5.0)
HCT: 37.5 % (ref 36.0–46.0)
HEMOGLOBIN: 12.5 g/dL (ref 12.0–15.0)
Lymphocytes Relative: 17.4 % (ref 12.0–46.0)
Lymphs Abs: 1.4 10*3/uL (ref 0.7–4.0)
MCHC: 33.3 g/dL (ref 30.0–36.0)
MCV: 91.5 fl (ref 78.0–100.0)
MONO ABS: 0.6 10*3/uL (ref 0.1–1.0)
Monocytes Relative: 7.9 % (ref 3.0–12.0)
NEUTROS ABS: 5.8 10*3/uL (ref 1.4–7.7)
NEUTROS PCT: 72.1 % (ref 43.0–77.0)
Platelets: 225 10*3/uL (ref 150.0–400.0)
RBC: 4.1 Mil/uL (ref 3.87–5.11)
RDW: 13.6 % (ref 11.5–14.6)
WBC: 8.1 10*3/uL (ref 4.5–10.5)

## 2013-10-24 LAB — BASIC METABOLIC PANEL
BUN: 15 mg/dL (ref 6–23)
CALCIUM: 9.1 mg/dL (ref 8.4–10.5)
CHLORIDE: 97 meq/L (ref 96–112)
CO2: 25 meq/L (ref 19–32)
CREATININE: 1.1 mg/dL (ref 0.4–1.2)
GFR: 51.15 mL/min — ABNORMAL LOW (ref >60.00)
GLUCOSE: 90 mg/dL (ref 70–99)
Potassium: 4 mEq/L (ref 3.5–5.1)
Sodium: 131 mEq/L — ABNORMAL LOW (ref 135–145)

## 2013-10-30 MED ORDER — SODIUM CHLORIDE 0.9 % IR SOLN
80.0000 mg | Status: DC
  Filled 2013-10-30: qty 2

## 2013-10-30 MED ORDER — CEFAZOLIN SODIUM-DEXTROSE 2-3 GM-% IV SOLR
2.0000 g | INTRAVENOUS | Status: DC
  Filled 2013-10-30: qty 50

## 2013-10-31 ENCOUNTER — Encounter (HOSPITAL_COMMUNITY)
Admission: RE | Disposition: A | Discharge status: Home / Self Care | Payer: Self-pay | Source: Ambulatory Visit | Attending: Internal Medicine

## 2013-10-31 ENCOUNTER — Ambulatory Visit (HOSPITAL_COMMUNITY)
Admission: RE | Admit: 2013-10-31 | Discharge: 2013-11-01 | Disposition: A | Discharge status: Home / Self Care | Payer: Medicare Other | Source: Ambulatory Visit | Attending: Internal Medicine | Admitting: Internal Medicine

## 2013-10-31 ENCOUNTER — Encounter (HOSPITAL_COMMUNITY): Payer: Self-pay | Admitting: *Deleted

## 2013-10-31 DIAGNOSIS — I509 Heart failure, unspecified: Secondary | ICD-10-CM | POA: Diagnosis not present

## 2013-10-31 DIAGNOSIS — I447 Left bundle-branch block, unspecified: Secondary | ICD-10-CM | POA: Diagnosis not present

## 2013-10-31 DIAGNOSIS — K219 Gastro-esophageal reflux disease without esophagitis: Secondary | ICD-10-CM | POA: Diagnosis not present

## 2013-10-31 DIAGNOSIS — Z8673 Personal history of transient ischemic attack (TIA), and cerebral infarction without residual deficits: Secondary | ICD-10-CM | POA: Diagnosis not present

## 2013-10-31 DIAGNOSIS — E039 Hypothyroidism, unspecified: Secondary | ICD-10-CM | POA: Insufficient documentation

## 2013-10-31 DIAGNOSIS — E785 Hyperlipidemia, unspecified: Secondary | ICD-10-CM | POA: Diagnosis not present

## 2013-10-31 DIAGNOSIS — Z7982 Long term (current) use of aspirin: Secondary | ICD-10-CM | POA: Diagnosis not present

## 2013-10-31 DIAGNOSIS — I10 Essential (primary) hypertension: Secondary | ICD-10-CM | POA: Insufficient documentation

## 2013-10-31 DIAGNOSIS — M1610 Unilateral primary osteoarthritis, unspecified hip: Secondary | ICD-10-CM | POA: Insufficient documentation

## 2013-10-31 DIAGNOSIS — I5022 Chronic systolic (congestive) heart failure: Secondary | ICD-10-CM | POA: Diagnosis present

## 2013-10-31 DIAGNOSIS — M1710 Unilateral primary osteoarthritis, unspecified knee: Secondary | ICD-10-CM | POA: Insufficient documentation

## 2013-10-31 DIAGNOSIS — M169 Osteoarthritis of hip, unspecified: Secondary | ICD-10-CM | POA: Diagnosis not present

## 2013-10-31 DIAGNOSIS — I428 Other cardiomyopathies: Secondary | ICD-10-CM | POA: Insufficient documentation

## 2013-10-31 HISTORY — PX: IMPLANTABLE CARDIOVERTER DEFIBRILLATOR IMPLANT: SHX5473

## 2013-10-31 HISTORY — DX: Left bundle-branch block, unspecified: I44.7

## 2013-10-31 HISTORY — PX: BI-VENTRICULAR IMPLANTABLE CARDIOVERTER DEFIBRILLATOR  (CRT-D): SHX5747

## 2013-10-31 LAB — SURGICAL PCR SCREEN
MRSA, PCR: NEGATIVE
Staphylococcus aureus: NEGATIVE

## 2013-10-31 SURGERY — IMPLANTABLE CARDIOVERTER DEFIBRILLATOR IMPLANT
Anesthesia: LOCAL

## 2013-10-31 MED ORDER — CEFAZOLIN SODIUM 1-5 GM-% IV SOLN
1.0000 g | Freq: Four times a day (QID) | INTRAVENOUS | Status: AC
  Administered 2013-10-31 – 2013-11-01 (×3): 1 g via INTRAVENOUS
  Filled 2013-10-31 (×3): qty 50

## 2013-10-31 MED ORDER — FENTANYL CITRATE 0.05 MG/ML IJ SOLN
INTRAMUSCULAR | Status: AC
  Filled 2013-10-31: qty 2

## 2013-10-31 MED ORDER — MIDAZOLAM HCL 5 MG/5ML IJ SOLN
INTRAMUSCULAR | Status: AC
  Filled 2013-10-31: qty 5

## 2013-10-31 MED ORDER — LIDOCAINE HCL (PF) 1 % IJ SOLN
INTRAMUSCULAR | Status: AC
  Filled 2013-10-31: qty 60

## 2013-10-31 MED ORDER — LIDOCAINE HCL (PF) 1 % IJ SOLN
INTRAMUSCULAR | Status: AC
  Filled 2013-10-31: qty 30

## 2013-10-31 MED ORDER — HEPARIN (PORCINE) IN NACL 2-0.9 UNIT/ML-% IJ SOLN
INTRAMUSCULAR | Status: AC
  Filled 2013-10-31: qty 500

## 2013-10-31 MED ORDER — MUPIROCIN 2 % EX OINT
TOPICAL_OINTMENT | Freq: Two times a day (BID) | CUTANEOUS | Status: DC
  Administered 2013-10-31: 1 via NASAL
  Filled 2013-10-31: qty 22

## 2013-10-31 MED ORDER — ACETAMINOPHEN 325 MG PO TABS
325.0000 mg | ORAL_TABLET | ORAL | Status: DC | PRN
  Administered 2013-10-31: 650 mg via ORAL
  Filled 2013-10-31: qty 2

## 2013-10-31 MED ORDER — TRAMADOL HCL 50 MG PO TABS
50.0000 mg | ORAL_TABLET | Freq: Four times a day (QID) | ORAL | Status: DC | PRN
  Administered 2013-10-31 – 2013-11-01 (×2): 50 mg via ORAL
  Filled 2013-10-31 (×2): qty 1

## 2013-10-31 MED ORDER — SODIUM CHLORIDE 0.9 % IV SOLN
INTRAVENOUS | Status: DC
  Administered 2013-10-31: 12:00:00 via INTRAVENOUS

## 2013-10-31 MED ORDER — CHLORHEXIDINE GLUCONATE 4 % EX LIQD
60.0000 mL | Freq: Once | CUTANEOUS | Status: DC
  Filled 2013-10-31: qty 60

## 2013-10-31 MED ORDER — MUPIROCIN 2 % EX OINT
TOPICAL_OINTMENT | CUTANEOUS | Status: AC
  Administered 2013-10-31: 1 via NASAL
  Filled 2013-10-31: qty 22

## 2013-10-31 MED ORDER — OXYCODONE HCL 5 MG PO TABS
5.0000 mg | ORAL_TABLET | ORAL | Status: DC | PRN
Start: 2013-10-31 — End: 2013-11-01
  Administered 2013-11-01: 5 mg via ORAL
  Filled 2013-10-31 (×2): qty 1

## 2013-10-31 MED ORDER — MORPHINE SULFATE 2 MG/ML IJ SOLN: 2.0000 mg | INTRAMUSCULAR | Status: DC | PRN

## 2013-10-31 MED ORDER — ONDANSETRON HCL 4 MG/2ML IJ SOLN: 4.0000 mg | Freq: Four times a day (QID) | INTRAMUSCULAR | Status: DC | PRN

## 2013-10-31 NOTE — CV Procedure (Signed)
SURGEON:  Cristopher Peru, MD      PREPROCEDURE DIAGNOSES:   1. Nonischemic cardiomyopathy.   2. New York Heart Association class III, heart failure chronically.   3. Left bundle-branch block QRS 170 ms.      POSTPROCEDURE DIAGNOSES:   1. Nonischemic cardiomyopathy.   2. New York Heart Association class III heart failure chronically.   3. Left bundle-branch block.      PROCEDURES:    1. Biventricular ICD implantation.  2. Defibrillation threshold testing 3. Venography of the coronary sinus     INTRODUCTION:  [name] is a 73 y.o. female with a nonischemic CM (EF 30%), NYHA Class III CHF, and LBBB QRS morophology. At this time, she meets MADIT II/ SCD-HeFT criteria for ICD implantation for primary prevention of sudden death.  Given LBBB, the patient may also be expected to benefit from resynchronization therapy. The patient has been treated with an optimal medical regimen but continues to have a depressed ejection fraction and NYHA Class III CHF symptoms.  she therefore  presents today for a biventricular ICD implantation.      DESCRIPTION OF PROCEDURE:  Informed written consent was obtained and the   patient was brought to the electrophysiology lab in the fasting state. The patient was adequately sedated with intravenous Versed and Fentanyl as outlined in the nursing report.  The patient's left chest was prepped and draped in the usual sterile fashion by the EP lab staff.  The skin overlying the left deltopectoral region was infiltrated with lidocaine for local analgesia.  A 6-cm incision was made over the left deltopectoral region.  A left subcutaneous defibrillator pocket was fashioned using a combination of sharp and blunt dissection.  Electrocautery was used to assure hemostasis.   Left Upper extremity Venography:  A venogram of the left upper extremity was performed which revealed a moderate sized left axillary vein which emptied into a moderate sized left subclavian vein.    RA/RV  Lead Placement: The left axillary vein was cannulated with fluoroscopic visualization.  No contrast was required for this endeavor.  Through the left axillary vein, a medtronic (serial # [id number]  ) right atrial lead and a medtronic (serial number [id number]) right ventricular defibrillator lead were advanced with fluoroscopic visualization into the right atrial appendage and right ventricular apical septal positions respectively.  Initial atrial lead P-waves measured 4 mV with an impedance of 683 ohms and a threshold of 0.7 volts at 0.5 milliseconds.  The right ventricular lead R-wave measured 8.5 mV with impedance of 655 ohms and a threshold of 0.7 volts at 0.5 milliseconds.   LV Lead Placement:  A Medtronic MB2 guide was advanced through the left axillary vein into the low lateral right atrium. A 6 french hexapolar EP catheter was introduced through the Medtronic MB2 guide and used to cannulate the coronary sinus. A coronary sinus nonselective venogram was performed by hand injection of nonionic contrast. This demonstrated a large S curve lateral vein.  A 0.014 angioplasty guide wire was introduced through the [place] and advanced into the lateral vein. A Medtronic quadrapolar left ventricular(serial number [id number]) lead was advanced through the lateral vein into the distal lateral branch. This was approximately two-thirds from the base to the apex in a very lateral  position. In this location with bipolar configuration, the left ventricular lead R-waves measured 16 mV with impedance of 556 ohms and a threshold of 0.6 volt at 0.5 Milliseconds with no diaphragmatic stimulation observed when pacing at 10  volts output. The Medtronic guide was therefore removed.  All three leads were secured to the pectoralis fascia using #2 silk suture over the suture sleeves. The pocket then irrigated with copious gentamicin solution.   Device Placement: The leads were then  connected to a Medtronic (serial   Number [id number]) biventricular ICD.  The defibrillator was placed into the  pocket.  The pocket was then closed in 2 layers with 2.0 Vicryl suture  for the subcutaneous and subcuticular layers.  Steri-Strips and a  sterile dressing were then applied.   DFT Testing: Defibrillation Threshold testing was then performed. Ventricular fibrillation was induced with a T shock.  Adequate sensing of ventricular  fibrillation was observed with minimal dropout with a programmed sensitivity of 1.54mV.  The patient was successfully defibrillated to sinus rhythm with a single 20 joules shock delivered from the device with an impedance of 82 ohms in a duration of 8 seconds.  The patient remained in sinus rhythm thereafter.  There were no early apparent complications.      CONCLUSIONS:   1. Nonischemic cardiomyopathy with Left bundle-branch block and chronic New York Heart Association class III heart failure.   2. Successful biventricular ICD implantation.   3. DFT less than or equal to 20 joules.   4. No early apparent complications.   Cristopher Peru, MD  2:41 PM 10/31/2013

## 2013-10-31 NOTE — Discharge Summary (Addendum)
ELECTROPHYSIOLOGY PROCEDURE DISCHARGE SUMMARY    Patient ID: [name],  MRN: [id number], DOB/AGE: [birth year] 73 y.o.  Admit date: 10/31/2013 Discharge date: 10/31/2013  Primary Care Physician: Alonza Bogus, MD Primary Cardiologist: Bronson Ing Electrophysiologist: Lovena Le  Primary Discharge Diagnosis:  Non ischemic cardiomyopathy, Chronic systolic heart failure class 3, EF 30%, and LBBB, QRS duration 170 ms  Secondary Discharge Diagnosis:  1.  Hyperlipidemia 2.  Hypertension 3.  Hypothyroid 4.  GERD  Allergies  Allergen Reactions  . Ciprofloxacin Cough  . Dexlansoprazole     Lower abdominal pain.  . Statins Other (See Comments)    Arthralgias during treatment with at least 2 members of his class      Procedures This Admission:  1.  Implantation of a Medtronic CRT-D on 10-31-13 by Dr Lovena Le.  The patient received a MDT model number Viva XT CRTD with model number 5076 right atrial lead, 6935 right ventricular lead, and 4598 left ventricular lead.  DFT's were successful at 3 J.  There were no immediate post procedure complications. 2.  CXR on 11-01-13 demonstrated no pneumothorax status post device implantation.   Brief HPI: [name] is a 73 y.o. female was referred to electrophysiology in the outpatient setting for consideration of CRTD implantation.  Past medical therapy includes NICM, Chronic systolic heart failure, class 3, and LBBB. The patient has persistent LV dysfunction despite guideline directed therapy. Her ejection fraction has been less than or equal to 35% for 7 years. Risks, benefits, and alternatives to ICD implantation were reviewed with the patient who wished to proceed.   Hospital Course:  The patient was admitted and underwent implantation of a MDT CRTD with details as outlined above.   She was monitored on telemetry overnight which demonstrated nsr.  Left chest was without hematoma or ecchymosis.  The device was interrogated and found to be  functioning normally.  CXR was obtained and demonstrated no pneumothorax status post device implantation.  Wound care, arm mobility, and restrictions were reviewed with the patient.  I examined the patient and considered her stable for discharge to home.   The patient's discharge medications include an ACE-I (Benazepril) and beta blocker (Carvedilol).   Discharge Vitals: Blood pressure 148/102, pulse 75, temperature 98.3 F (36.8 C), temperature source Oral, resp. rate 18, height 5\' 7"  (1.702 m), weight 229 lb (103.874 kg), SpO2 98.00%.   Labs:   Lab Results  Component Value Date   WBC 8.1 10/24/2013   HGB 12.5 10/24/2013   HCT 37.5 10/24/2013   MCV 91.5 10/24/2013   PLT 225.0 10/24/2013   No results found for this basename: NA, K, CL, CO2, BUN, CREATININE, CALCIUM, LABALBU, PROT, BILITOT, ALKPHOS, ALT, AST, GLUCOSE,  in the last 168 hours No results found for this basename: CKTOTAL, CKMB, CKMBINDEX, TROPONINI     Discharge Medications:    Medication List    ASK your doctor about these medications       amLODipine 2.5 MG tablet  Commonly known as:  NORVASC  Take 2.5 mg by mouth every morning.     aspirin EC 81 MG tablet  Take 81 mg by mouth every morning.     benazepril 40 MG tablet  Commonly known as:  LOTENSIN  Take 40 mg by mouth every morning.     carvedilol 25 MG tablet  Commonly known as:  COREG  Takes one-half tablet in the morning and one tablet in the evening/at bedtime     CENTRUM SILVER PO  Take 1 tablet by mouth daily.     chlorthalidone 25 MG tablet  Commonly known as:  HYGROTON  Take 25 mg by mouth every morning.     cholecalciferol 1000 UNITS tablet  Commonly known as:  VITAMIN D  Take 1,000 Units by mouth daily.     fexofenadine 180 MG tablet  Commonly known as:  ALLEGRA  Take 180 mg by mouth every morning.     fluticasone 50 MCG/ACT nasal spray  Commonly known as:  FLONASE  Place 2 sprays into the nose daily as needed for allergies.      lansoprazole 15 MG capsule  Commonly known as:  PREVACID  Take 15 mg by mouth every morning.     spironolactone 25 MG tablet  Commonly known as:  ALDACTONE  Take 12.5 mg by mouth every other day.     SYNTHROID 100 MCG tablet  Generic drug:  levothyroxine  Take 100 mcg by mouth every morning.     traMADol 50 MG tablet  Commonly known as:  ULTRAM  Take 50 mg by mouth every 6 (six) hours as needed for moderate pain.        Disposition:       Future Appointments Provider Department Dept Phone   11/12/2013 3:00 PM [facility] [place] Office [phone]       Duration of Discharge Encounter: Greater than 30 minutes including physician time.  Signed,  Mikle Bosworth.D.

## 2013-10-31 NOTE — Interval H&P Note (Signed)
History and Physical Interval Note:  10/31/2013 10:52 AM  [name]  has presented today for surgery, with the diagnosis of Heart Failure and LV disfunction  The various methods of treatment have been discussed with the patient and family. After consideration of risks, benefits and other options for treatment, the patient has consented to  Procedure(s): BiVentricular  IMPLANTABLE CARDIOVERTER DEFIBRILLATOR IMPLANT (N/A) as a surgical intervention .  The patient's history has been reviewed, patient examined, no change in status, stable for surgery.  I have reviewed the patient's chart and labs.  Questions were answered to the patient's satisfaction.     Norlene Duel.D.

## 2013-10-31 NOTE — H&P (View-Only) (Signed)
HPI [name] is referred today by Dr. Raliegh Ip for evaluation of and consideration for a BiV ICD. She is a pleasant 73 yo woman with a h/o non-ischemic CM, worsening CHF, despite maximal medical therapy and LBBB. Over the past few months she has had increasing fatigue, weakness and sob. She used to be very active, with class 1 symptoms which have progressed to class 3. She has not had syncope but does note a TIA which occurred 2 months ago. The etiology was unclear. She denies peripheral edema. No syncope.  Allergies  Allergen Reactions  . Dexlansoprazole     Lower abdominal pain.  . Statins Other (See Comments)    Arthralgias during treatment with at least 2 members of his class      Current Outpatient Prescriptions  Medication Sig Dispense Refill  . amLODipine (NORVASC) 2.5 MG tablet Take 2.5 mg by mouth every morning.      Marland Kitchen aspirin EC 81 MG tablet Take 81 mg by mouth every morning.       . benazepril (LOTENSIN) 40 MG tablet Take 40 mg by mouth every morning.      . benazepril (LOTENSIN) 40 MG tablet TAKE ONE TABLET BY MOUTH ONCE DAILY  90 tablet  3  . carvedilol (COREG) 25 MG tablet Takes one-half tablet in the morning and one tablet in the evening/at bedtime  60 tablet  6  . chlorthalidone (HYGROTON) 25 MG tablet Take 25 mg by mouth every morning.       . chlorthalidone (HYGROTON) 25 MG tablet TAKE ONE TABLET BY MOUTH ONCE DAILY  90 tablet  3  . cholecalciferol (VITAMIN D) 1000 UNITS tablet Take 1,000 Units by mouth daily.      . fexofenadine (ALLEGRA) 180 MG tablet Take 180 mg by mouth every morning.       . fluticasone (FLONASE) 50 MCG/ACT nasal spray Place 2 sprays into the nose daily as needed.       . lansoprazole (PREVACID) 15 MG capsule Take 15 mg by mouth every morning.       Marland Kitchen levothyroxine (SYNTHROID) 100 MCG tablet Take 100 mcg by mouth every morning.       . Multiple Vitamins-Minerals (CENTRUM SILVER PO) Take 1 tablet by mouth daily.       Marland Kitchen spironolactone (ALDACTONE)  25 MG tablet Take 12.5 mg by mouth every other day.      . traMADol (ULTRAM) 50 MG tablet Take 50 mg by mouth every 6 (six) hours as needed. pain       No current facility-administered medications for this visit.     Past Medical History  Diagnosis Date  . Hyperlipemia   . Hypertension   . Degenerative joint disease     Right hip and knee  . Hypothyroid   . 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 GERD (gastroesophageal reflux disease)   . S/P colonoscopy 2009    Dr. Sharlett Iles: reportedly normal per pt  . S/P endoscopy 1980s    per pt; normal. performed secondary to reflux  . Cerebrovascular accident     No clinical event; asymptomatic CT finding    ROS:   All systems reviewed and negative except as noted in the HPI.   Past Surgical History  Procedure Laterality Date  . Thyroidectomy      neoplasm  . Cholecystectomy    .  Appendectomy    . Abdominal hysterectomy    . Breast biopsy      bilaterally; benign  . Mandible surgery      secondary to malocclusion  . Ureteral surgery  1970  . Colonoscopy  2009  . Esophagogastroduodenoscopy  07/13/2011    small hh, patchy gastric erythema with scarring deformity of antrum, chronic gastritis, bx benign with no H.Pylori. Procedure: ESOPHAGOGASTRODUODENOSCOPY (EGD);  Surgeon: Daneil Dolin, MD;  Location: [facility];  Service: Endoscopy;  Laterality: N/A;  10:45  . Can not have mri due to jaw surgery       Family History  Problem Relation Age of Onset  . Brain cancer Mother   . Atrial fibrillation Other   . Colon cancer Neg Hx      History   Social History  . Marital Status: Widowed    Spouse Name: N/A    Number of Children: 2  . Years of Education: N/A   Occupational History  . Not on file.   Social History Main Topics  . Smoking status: Never Smoker   . Smokeless tobacco: Never Used  . Alcohol Use: No    . Drug Use: No  . Sexual Activity: Not on file   Other Topics Concern  . Not on file   Social History Narrative   Widowed           BP 136/70  Pulse 71  Ht 5\' 7"  (1.702 m)  Wt 229 lb (103.874 kg)  BMI 35.86 kg/m2  Physical Exam:  Well appearing 73 yo woman, NAD HEENT: Unremarkable Neck:  No JVD, no thyromegally Back:  No CVA tenderness Lungs:  Clear with no wheezes, rales, or rhonchi HEART:  Regular rate rhythm, no murmurs, no rubs, no clicks Abd:  soft, positive bowel sounds, no organomegally, no rebound, no guarding Ext:  2 plus pulses, no edema, no cyanosis, no clubbing Skin:  No rashes no nodules Neuro:  CN II through XII intact, motor grossly intact  EKG - nsr with lbbb  DEVICE  Normal device function.  See PaceArt for details.   Assess/Plan:

## 2013-10-31 NOTE — H&P (Signed)
  ICD Criteria  Current LVEF:30% ;Obtained > 6 months ago.   NYHA Functional Classification: Class III  Heart Failure History:  Yes, Duration of heart failure since onset is > 9 months  Non-Ischemic Dilated Cardiomyopathy History:  Yes, timeframe is > 9 months  Atrial Fibrillation/Atrial Flutter:  No.  Ventricular Tachycardia History:  No.  Cardiac Arrest History:  No  History of Syndromes with Risk of Sudden Death:  No.  Previous ICD:  No.  Electrophysiology Study: No.  Prior MI: No.  PPM: No.  OSA:  No  Patient Life Expectancy of >=1 year: Yes.  Anticoagulation Therapy:  Patient is NOT on anticoagulation therapy.   Beta Blocker Therapy:  Yes.   Ace Inhibitor/ARB Therapy:  Yes.

## 2013-11-01 ENCOUNTER — Ambulatory Visit (HOSPITAL_COMMUNITY): Payer: Medicare Other

## 2013-11-01 DIAGNOSIS — I509 Heart failure, unspecified: Secondary | ICD-10-CM | POA: Diagnosis not present

## 2013-11-01 DIAGNOSIS — I5022 Chronic systolic (congestive) heart failure: Secondary | ICD-10-CM | POA: Diagnosis not present

## 2013-11-01 DIAGNOSIS — I428 Other cardiomyopathies: Secondary | ICD-10-CM | POA: Diagnosis not present

## 2013-11-01 DIAGNOSIS — I10 Essential (primary) hypertension: Secondary | ICD-10-CM | POA: Diagnosis not present

## 2013-11-01 DIAGNOSIS — R918 Other nonspecific abnormal finding of lung field: Secondary | ICD-10-CM | POA: Diagnosis not present

## 2013-11-01 DIAGNOSIS — E785 Hyperlipidemia, unspecified: Secondary | ICD-10-CM | POA: Diagnosis not present

## 2013-11-01 DIAGNOSIS — E039 Hypothyroidism, unspecified: Secondary | ICD-10-CM | POA: Diagnosis not present

## 2013-11-01 MED ORDER — YOU HAVE A PACEMAKER BOOK
Freq: Once | Status: AC
  Administered 2013-11-01: 11:00:00
  Filled 2013-11-01 (×2): qty 1

## 2013-11-01 MED ORDER — ACETAMINOPHEN 325 MG PO TABS: 325.0000 mg | ORAL_TABLET | ORAL | Status: DC | PRN

## 2013-11-01 NOTE — Progress Notes (Signed)
Pacemaker/ICD book given. All discharged instructions given; questions answered. D/C home with family, self care.

## 2013-11-06 ENCOUNTER — Telehealth: Payer: Self-pay | Admitting: Internal Medicine

## 2013-11-06 NOTE — Telephone Encounter (Signed)
New Message:  Pt wants to know when she can take her sling off. She states she is wearing a sling since her surgery.. She also wants to know if she can use aspercream/bengay on her shoulder... Pt wants a call back

## 2013-11-11 NOTE — Telephone Encounter (Signed)
Spoke w/pt in regards to sling and using aspercream/bengay. All questions answered and verified pt has appointment on 11-12-13.

## 2013-11-12 ENCOUNTER — Ambulatory Visit (INDEPENDENT_AMBULATORY_CARE_PROVIDER_SITE_OTHER): Payer: Medicare Other | Admitting: *Deleted

## 2013-11-12 DIAGNOSIS — I428 Other cardiomyopathies: Secondary | ICD-10-CM

## 2013-11-12 DIAGNOSIS — I5022 Chronic systolic (congestive) heart failure: Secondary | ICD-10-CM

## 2013-11-12 DIAGNOSIS — I447 Left bundle-branch block, unspecified: Secondary | ICD-10-CM

## 2013-11-12 LAB — MDC_IDC_ENUM_SESS_TYPE_INCLINIC
Battery Remaining Longevity: 7.1
Brady Statistic AP VP Percent: 4.1 %
Brady Statistic AP VS Percent: 0.1 % — CL
Brady Statistic AS VP Percent: 94 %
Brady Statistic AS VS Percent: 1.9 %
HIGH POWER IMPEDANCE MEASURED VALUE: 67 Ohm
Lead Channel Impedance Value: 456 Ohm
Lead Channel Impedance Value: 475 Ohm
Lead Channel Pacing Threshold Amplitude: 0.5 V
Lead Channel Pacing Threshold Pulse Width: 0.4 ms
Lead Channel Pacing Threshold Pulse Width: 0.4 ms
Lead Channel Sensing Intrinsic Amplitude: 1.6 mV
Lead Channel Setting Pacing Amplitude: 2.75 V
Lead Channel Setting Pacing Amplitude: 3.5 V
Lead Channel Setting Pacing Pulse Width: 0.4 ms
Lead Channel Setting Sensing Sensitivity: 0.3 mV
MDC IDC MSMT LEADCHNL LV IMPEDANCE VALUE: 399 Ohm
MDC IDC MSMT LEADCHNL LV PACING THRESHOLD AMPLITUDE: 1 V
MDC IDC MSMT LEADCHNL RV PACING THRESHOLD AMPLITUDE: 0.5 V
MDC IDC MSMT LEADCHNL RV PACING THRESHOLD PULSEWIDTH: 0.4 ms
MDC IDC MSMT LEADCHNL RV SENSING INTR AMPL: 9.3 mV
MDC IDC SET LEADCHNL RA PACING AMPLITUDE: 3.5 V
MDC IDC SET LEADCHNL RV PACING PULSEWIDTH: 0.4 ms
Zone Setting Detection Interval: 300 ms

## 2013-11-13 NOTE — Progress Notes (Signed)
Wound check appointment. Steri-strips removed. Wound without redness or edema. Incision edges approximated, wound well healed. Normal device function. Thresholds, sensing, and impedances consistent with implant measurements. Device programmed at 3.5V for extra safety margin until 3 month visit. Histogram distribution appropriate for patient and level of activity. 12 mode switches (0.6%)---max dur. 1 hr 23 mins on 4-18, Max A 330, Max V 148 (AVG) + ASA 81---Hx of TIA 2 mos ago, Hx of CVA, HTN, 73 y.o.---APPEARS TO BE FIRST EVIDENCE OF AF. No ventricular arrhythmias noted. 23 VS episodes----max dur. 11 mins, Max A >400, Max V 154---AF w/RVR + Coreg 12.5AM/25PM. Patient educated about wound care, arm mobility, lifting restrictions, shock plan. ROV with GT in 5-4 to discuss AF and wound recheck.

## 2013-11-17 ENCOUNTER — Encounter: Payer: Self-pay | Admitting: Internal Medicine

## 2013-11-17 ENCOUNTER — Ambulatory Visit (INDEPENDENT_AMBULATORY_CARE_PROVIDER_SITE_OTHER): Payer: Medicare Other | Admitting: Internal Medicine

## 2013-11-17 VITALS — BP 134/72 | HR 72 | Ht 67.0 in | Wt 230.4 lb

## 2013-11-17 DIAGNOSIS — Z95810 Presence of automatic (implantable) cardiac defibrillator: Secondary | ICD-10-CM

## 2013-11-17 DIAGNOSIS — I4891 Unspecified atrial fibrillation: Secondary | ICD-10-CM

## 2013-11-17 MED ORDER — RIVAROXABAN 20 MG PO TABS: 20.0000 mg | ORAL_TABLET | Freq: Every day | ORAL | Status: DC

## 2013-11-17 NOTE — Progress Notes (Signed)
HPI [name] returns today for evaluation of atrial fibrillation. She underwent BiV ICD implant several weeks ago and was found on her monitor to have atrial fibrillation. She has a remote h/o TIA and her mother had atrial fib and multiple strokes. She has spells where she gets weak. No ICD shocks.  Allergies  Allergen Reactions  . Ciprofloxacin Cough  . Dexlansoprazole     Lower abdominal pain.  . Statins Other (See Comments)    Arthralgias during treatment with at least 2 members of his class      Current Outpatient Prescriptions  Medication Sig Dispense Refill  . amLODipine (NORVASC) 2.5 MG tablet Take 2.5 mg by mouth every morning.      Marland Kitchen aspirin EC 81 MG tablet Take 81 mg by mouth every morning.       . benazepril (LOTENSIN) 40 MG tablet Take 40 mg by mouth every morning.      . carvedilol (COREG) 25 MG tablet Takes one-half tablet in the morning and one tablet in the evening/at bedtime  60 tablet  6  . chlorthalidone (HYGROTON) 25 MG tablet Take 25 mg by mouth every morning.       . cholecalciferol (VITAMIN D) 1000 UNITS tablet Take 1,000 Units by mouth daily.      . fexofenadine (ALLEGRA) 180 MG tablet Take 180 mg by mouth every morning.       . fluticasone (FLONASE) 50 MCG/ACT nasal spray Place 2 sprays into the nose daily as needed for allergies.       Marland Kitchen lansoprazole (PREVACID) 15 MG capsule Take 15 mg by mouth every morning.       Marland Kitchen levothyroxine (SYNTHROID) 100 MCG tablet Take 100 mcg by mouth every morning.       . Multiple Vitamins-Minerals (CENTRUM SILVER PO) Take 1 tablet by mouth daily.       Marland Kitchen spironolactone (ALDACTONE) 25 MG tablet Take 12.5 mg by mouth every other day.      . traMADol (ULTRAM) 50 MG tablet Take 50 mg by mouth every 6 (six) hours as needed for moderate pain.        No current facility-administered medications for this visit.     Past Medical History  Diagnosis Date  . Hyperlipemia   . Hypertension   . Degenerative joint disease    Right hip and knee  . Hypothyroid   . 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 GERD (gastroesophageal reflux disease)   . S/P colonoscopy 2009    Dr. Sharlett Iles: reportedly normal per pt  . S/P endoscopy 1980s    per pt; normal. performed secondary to reflux  . Cerebrovascular accident     No clinical event; asymptomatic CT finding  . LBBB (left bundle branch block)     ROS:   All systems reviewed and negative except as noted in the HPI.   Past Surgical History  Procedure Laterality Date  . Thyroidectomy      neoplasm  . Cholecystectomy    . Appendectomy    . Abdominal hysterectomy    . Breast biopsy      bilaterally; benign  . Mandible surgery      secondary to malocclusion  . Ureteral surgery  1970  . Colonoscopy  2009  . Esophagogastroduodenoscopy  07/13/2011    small hh, patchy gastric erythema with scarring deformity of antrum, chronic  gastritis, bx benign with no H.Pylori. Procedure: ESOPHAGOGASTRODUODENOSCOPY (EGD);  Surgeon: Daneil Dolin, MD;  Location: [facility];  Service: Endoscopy;  Laterality: N/A;  10:45  . Can not have mri due to jaw surgery    . Bi-ventricular implantable cardioverter defibrillator  (crt-d)  10-31-2013    MDT Hillery Aldo CRTD implanted by Dr Lovena Le     Family History  Problem Relation Age of Onset  . Brain cancer Mother   . Atrial fibrillation Other   . Colon cancer Neg Hx      History   Social History  . Marital Status: Widowed    Spouse Name: N/A    Number of Children: 2  . Years of Education: N/A   Occupational History  . Not on file.   Social History Main Topics  . Smoking status: Never Smoker   . Smokeless tobacco: Never Used  . Alcohol Use: No  . Drug Use: No  . Sexual Activity: Not on file   Other Topics Concern  . Not on file   Social History Narrative   Widowed           BP 134/72  Pulse 72  Ht 5'  7" (1.702 m)  Wt 230 lb 6.4 oz (104.509 kg)  BMI 36.08 kg/m2  Physical Exam:  Well appearing 73 yo woman, NAD HEENT: Unremarkable Neck:  No JVD, no thyromegally Back:  No CVA tenderness Lungs:  Clear with no wheezes HEART:  Regular rate rhythm, no murmurs, no rubs, no clicks Abd:  soft, positive bowel sounds, no organomegally, no rebound, no guarding Ext:  2 plus pulses, no edema, no cyanosis, no clubbing Skin:  No rashes no nodules Neuro:  CN II through XII intact, motor grossly intact  DEVICE  Normal device function.  See PaceArt for details.   Assess/Plan:

## 2013-11-17 NOTE — Assessment & Plan Note (Signed)
Her device demonstrates that she is having atrial fib with up to 1.5 hours of duration. She will start Xarelto today. She may ultimately require an anti-arrhythmic drug. We will monitor her atrial arrhythmias with her device.

## 2013-11-17 NOTE — Patient Instructions (Addendum)
Your physician recommends that you schedule a follow-up appointment in: 3 months after device implant with [facility]  Your physician has recommended you make the following change in your medication:   Start Xarelto 20 mg daily. Please take after supper.

## 2013-11-18 ENCOUNTER — Telehealth: Payer: Self-pay | Admitting: *Deleted

## 2013-11-18 NOTE — Telephone Encounter (Signed)
Pt is concerned about taking Xarelto. Dr Lovena Le put her on it and she is to start taking it tonight. She just seen a commercial about it causing strokes and she is being put on it to prevent strokes.  She would like a call today if possible

## 2013-11-19 ENCOUNTER — Ambulatory Visit: Payer: Medicare Other

## 2013-11-19 NOTE — Telephone Encounter (Signed)
Made pt aware that if she has any unusual bleeding or black stools to let us know, but she should be fine. Pt felt better about drug.

## 2013-11-24 ENCOUNTER — Telehealth: Payer: Self-pay | Admitting: *Deleted

## 2013-11-24 NOTE — Telephone Encounter (Signed)
Dr Lovena Le in office today in [place], [place] him aware of the pt's rash. Dr Lovena Le advised to make an appt with device clinic in [address] office on the day he is there, so he can take a look at the rash. Appointment made for 11-26-13 at 12:30 pm

## 2013-11-24 NOTE — Telephone Encounter (Signed)
Pt has a rash that has broke out around pacemaker site. She states that it started about 4-5 days ago and was thinking it would go away. She states that it does itch and wants to know if she can take or put something on it.

## 2013-11-26 ENCOUNTER — Ambulatory Visit (INDEPENDENT_AMBULATORY_CARE_PROVIDER_SITE_OTHER): Payer: Medicare Other | Admitting: *Deleted

## 2013-11-26 DIAGNOSIS — I5022 Chronic systolic (congestive) heart failure: Secondary | ICD-10-CM

## 2013-11-26 LAB — MDC_IDC_ENUM_SESS_TYPE_INCLINIC
Battery Remaining Longevity: 97 mo
Battery Voltage: 3.09 V
Brady Statistic AP VP Percent: 3.86 %
Brady Statistic AP VS Percent: 0.02 %
Brady Statistic RV Percent Paced: 94.11 %
HighPow Impedance: 171 Ohm
HighPow Impedance: 65 Ohm
Lead Channel Impedance Value: 456 Ohm
Lead Channel Impedance Value: 475 Ohm
Lead Channel Pacing Threshold Amplitude: 0.375 V
Lead Channel Pacing Threshold Amplitude: 0.75 V
Lead Channel Pacing Threshold Amplitude: 0.75 V
Lead Channel Pacing Threshold Pulse Width: 0.4 ms
Lead Channel Pacing Threshold Pulse Width: 0.4 ms
Lead Channel Sensing Intrinsic Amplitude: 2.25 mV
Lead Channel Setting Pacing Amplitude: 2.25 V
Lead Channel Setting Pacing Amplitude: 3.5 V
Lead Channel Setting Pacing Amplitude: 3.5 V
Lead Channel Setting Pacing Pulse Width: 0.4 ms
Lead Channel Setting Pacing Pulse Width: 0.4 ms
Lead Channel Setting Sensing Sensitivity: 0.3 mV
MDC IDC MSMT LEADCHNL RA SENSING INTR AMPL: 2.25 mV
MDC IDC MSMT LEADCHNL RV PACING THRESHOLD PULSEWIDTH: 0.4 ms
MDC IDC MSMT LEADCHNL RV SENSING INTR AMPL: 11.25 mV
MDC IDC MSMT LEADCHNL RV SENSING INTR AMPL: 11.25 mV
MDC IDC SESS DTM: 20150513131622
MDC IDC SET ZONE DETECTION INTERVAL: 350 ms
MDC IDC STAT BRADY AS VP PERCENT: 93.61 %
MDC IDC STAT BRADY AS VS PERCENT: 2.51 %
MDC IDC STAT BRADY RA PERCENT PACED: 3.88 %
Zone Setting Detection Interval: 300 ms
Zone Setting Detection Interval: 360 ms
Zone Setting Detection Interval: 450 ms

## 2013-11-26 NOTE — Progress Notes (Signed)
Wound check for allergic reaction to tape.  Area red and patient c/o itching and also fatigue.  Hydrocortisone cream to affected area per Dr. Lovena Le and follow up as scheduled.

## 2013-12-02 ENCOUNTER — Encounter: Payer: Self-pay | Admitting: Internal Medicine

## 2013-12-22 ENCOUNTER — Encounter: Payer: Medicare Other | Admitting: Cardiovascular Disease

## 2014-01-09 ENCOUNTER — Encounter: Payer: Self-pay | Admitting: *Deleted

## 2014-01-19 ENCOUNTER — Ambulatory Visit (INDEPENDENT_AMBULATORY_CARE_PROVIDER_SITE_OTHER): Payer: Medicare Other | Admitting: Internal Medicine

## 2014-01-19 ENCOUNTER — Encounter: Payer: Self-pay | Admitting: Internal Medicine

## 2014-01-19 VITALS — BP 140/80 | HR 73 | Ht 67.0 in | Wt 227.1 lb

## 2014-01-19 DIAGNOSIS — G459 Transient cerebral ischemic attack, unspecified: Secondary | ICD-10-CM

## 2014-01-19 DIAGNOSIS — I5022 Chronic systolic (congestive) heart failure: Secondary | ICD-10-CM

## 2014-01-19 DIAGNOSIS — R079 Chest pain, unspecified: Secondary | ICD-10-CM

## 2014-01-19 DIAGNOSIS — I428 Other cardiomyopathies: Secondary | ICD-10-CM | POA: Diagnosis not present

## 2014-01-19 LAB — MDC_IDC_ENUM_SESS_TYPE_INCLINIC
Battery Voltage: 3.07 V
Brady Statistic AP VP Percent: 2.49 %
Brady Statistic AP VS Percent: 0.01 %
Brady Statistic AS VP Percent: 94.95 %
Brady Statistic RA Percent Paced: 2.5 %
Date Time Interrogation Session: 20150706153006
HighPow Impedance: 190 Ohm
HighPow Impedance: 71 Ohm
Lead Channel Impedance Value: 456 Ohm
Lead Channel Pacing Threshold Amplitude: 0.625 V
Lead Channel Pacing Threshold Amplitude: 0.75 V
Lead Channel Pacing Threshold Pulse Width: 0.4 ms
Lead Channel Pacing Threshold Pulse Width: 0.4 ms
Lead Channel Sensing Intrinsic Amplitude: 1.75 mV
Lead Channel Sensing Intrinsic Amplitude: 2.25 mV
Lead Channel Setting Pacing Amplitude: 1.75 V
Lead Channel Setting Pacing Amplitude: 2 V
Lead Channel Setting Pacing Pulse Width: 0.4 ms
Lead Channel Setting Sensing Sensitivity: 0.3 mV
MDC IDC MSMT BATTERY REMAINING LONGEVITY: 105 mo
MDC IDC MSMT LEADCHNL RA PACING THRESHOLD AMPLITUDE: 0.5 V
MDC IDC MSMT LEADCHNL RA PACING THRESHOLD PULSEWIDTH: 0.4 ms
MDC IDC MSMT LEADCHNL RV IMPEDANCE VALUE: 513 Ohm
MDC IDC MSMT LEADCHNL RV SENSING INTR AMPL: 10.375 mV
MDC IDC MSMT LEADCHNL RV SENSING INTR AMPL: 9.5 mV
MDC IDC SET LEADCHNL RA PACING AMPLITUDE: 1.5 V
MDC IDC SET LEADCHNL RV PACING PULSEWIDTH: 0.4 ms
MDC IDC SET ZONE DETECTION INTERVAL: 360 ms
MDC IDC SET ZONE DETECTION INTERVAL: 450 ms
MDC IDC STAT BRADY AS VS PERCENT: 2.55 %
MDC IDC STAT BRADY RV PERCENT PACED: 95.23 %
Zone Setting Detection Interval: 300 ms
Zone Setting Detection Interval: 350 ms

## 2014-01-19 MED ORDER — SULFAMETHOXAZOLE-TMP DS 800-160 MG PO TABS: 1.0000 | ORAL_TABLET | Freq: Two times a day (BID) | ORAL | Status: DC

## 2014-01-19 MED ORDER — FUROSEMIDE 40 MG PO TABS: 40.0000 mg | ORAL_TABLET | Freq: Every day | ORAL | Status: DC

## 2014-01-19 NOTE — Assessment & Plan Note (Signed)
She appears to be maintaining NSR. Will continue current meds.

## 2014-01-19 NOTE — Patient Instructions (Signed)
Your physician recommends that you schedule a follow-up appointment in 5 weeks with Dr. Lovena Le  Your physician has recommended you make the following change in your medication:   STOP TAKING:   CHLORTHALIDONE  AMLODIPINE  START TAKING:  BACTRIM DS TWICE DAILY FOR 7 DAYS  LASIX 40 MG DAILY   Your physician recommends that you return for lab work in: 1 WEEK (bmp cbc)  Thank you for choosing [facility]!!

## 2014-01-19 NOTE — Progress Notes (Signed)
HPI [name] returns today for evaluation of atrial fibrillation. She underwent BiV ICD implant several months ago and was found on her monitor to have atrial fibrillation. She has a remote h/o TIA and her mother had atrial fib and multiple strokes. She was started on Xarelto. Today she has multiple complaints including pain at her ICD insertion site, peripheral edema and fatigue and malaise. No ICD shock. She denies dietary non-compliance. She wants to know why she is not feeling better. Allergies  Allergen Reactions  . Ciprofloxacin Cough  . Dexlansoprazole     Lower abdominal pain.  . Statins Other (See Comments)    Arthralgias during treatment with at least 2 members of his class      Current Outpatient Prescriptions  Medication Sig Dispense Refill  . amLODipine (NORVASC) 2.5 MG tablet Take 2.5 mg by mouth every morning.      . benazepril (LOTENSIN) 40 MG tablet Take 40 mg by mouth every morning.      . carvedilol (COREG) 25 MG tablet Takes one-half tablet in the morning and one tablet in the evening/at bedtime  60 tablet  6  . chlorthalidone (HYGROTON) 25 MG tablet Take 25 mg by mouth every morning.       . cholecalciferol (VITAMIN D) 1000 UNITS tablet Take 1,000 Units by mouth daily.      . fexofenadine (ALLEGRA) 180 MG tablet Take 180 mg by mouth every morning.       . fluticasone (FLONASE) 50 MCG/ACT nasal spray Place 2 sprays into the nose daily as needed for allergies.       Marland Kitchen lansoprazole (PREVACID) 15 MG capsule Take 15 mg by mouth every morning.       Marland Kitchen levothyroxine (SYNTHROID) 100 MCG tablet Take 100 mcg by mouth every morning.       . Multiple Vitamins-Minerals (CENTRUM SILVER PO) Take 1 tablet by mouth daily.       . rivaroxaban (XARELTO) 20 MG TABS tablet Take 1 tablet (20 mg total) by mouth daily with supper.  30 tablet  6  . spironolactone (ALDACTONE) 25 MG tablet Take 12.5 mg by mouth every other day.      . traMADol (ULTRAM) 50 MG tablet Take 50 mg by  mouth every 6 (six) hours as needed for moderate pain.        No current facility-administered medications for this visit.     Past Medical History  Diagnosis Date  . Hyperlipemia   . Hypertension   . Degenerative joint disease     Right hip and knee  . Hypothyroid   . 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 GERD (gastroesophageal reflux disease)   . S/P colonoscopy 2009    Dr. Sharlett Iles: reportedly normal per pt  . S/P endoscopy 1980s    per pt; normal. performed secondary to reflux  . Cerebrovascular accident     No clinical event; asymptomatic CT finding  . LBBB (left bundle branch block)     ROS:   All systems reviewed and negative except as noted in the HPI.   Past Surgical History  Procedure Laterality Date  . Thyroidectomy      neoplasm  . Cholecystectomy    . Appendectomy    . Abdominal hysterectomy    . Breast biopsy      bilaterally; benign  . Mandible surgery  secondary to malocclusion  . Ureteral surgery  1970  . Colonoscopy  2009  . Esophagogastroduodenoscopy  07/13/2011    small hh, patchy gastric erythema with scarring deformity of antrum, chronic gastritis, bx benign with no H.Pylori. Procedure: ESOPHAGOGASTRODUODENOSCOPY (EGD);  Surgeon: Daneil Dolin, MD;  [facility]: [facility];  Service: Endoscopy;  Laterality: N/A;  10:45  . Can not have mri due to jaw surgery    . Bi-ventricular implantable cardioverter defibrillator  (crt-d)  10-31-2013    MDT Hillery Aldo CRTD implanted by Dr Lovena Le     Family History  Problem Relation Age of Onset  . Brain cancer Mother   . Atrial fibrillation Other   . Colon cancer Neg Hx      History   Social History  . Marital Status: Widowed    Spouse Name: N/A    Number of Children: 2  . Years of Education: N/A   Occupational History  . Not on file.   Social History Main Topics  . Smoking status:  Never Smoker   . Smokeless tobacco: Never Used  . Alcohol Use: No  . Drug Use: No  . Sexual Activity: Not on file   Other Topics Concern  . Not on file   Social History Narrative   Widowed           BP 140/80  Pulse 73  Ht 5\' 7"  (1.702 m)  Wt 227 lb 1.9 oz (103.021 kg)  BMI 35.56 kg/m2  Physical Exam:  Well appearing 73 yo woman, looks older than her stated age, NAD HEENT: Unremarkable Neck:  No JVD, no thyromegally Back:  No CVA tenderness Lungs:  Clear with no wheezes, tenderness at ICD insertion site. HEART:  Regular rate rhythm, no murmurs, no rubs, no clicks Abd:  soft, positive bowel sounds, no organomegally, no rebound, no guarding Ext:  2 plus pulses, no edema, no cyanosis, no clubbing Skin:  No rashes no nodules Neuro:  CN II through XII intact, motor grossly intact  DEVICE  Normal device function.  See PaceArt for details.   Assess/Plan:

## 2014-01-19 NOTE — Assessment & Plan Note (Signed)
She is not improved. She will discontinue her chlorthalidone and amlodipine as she has persistent peripheral edema. Will start daily lasix. Will check labs in a week or so.

## 2014-01-19 NOTE — Assessment & Plan Note (Signed)
The patient's device appears to be working normally but she has some incisional pain. There is a very small amount of erythema and tenderness to palpation. No fluctuance. I will prescribe some Bactrim and see her back in several weeks.

## 2014-01-26 ENCOUNTER — Encounter: Payer: Self-pay | Admitting: Internal Medicine

## 2014-01-26 DIAGNOSIS — R079 Chest pain, unspecified: Secondary | ICD-10-CM | POA: Diagnosis not present

## 2014-01-26 LAB — CBC WITH DIFFERENTIAL/PLATELET
Basophils Absolute: 0.1 10*3/uL (ref 0.0–0.1)
Basophils Relative: 1 % (ref 0–1)
Eosinophils Absolute: 0.2 10*3/uL (ref 0.0–0.7)
Eosinophils Relative: 2 % (ref 0–5)
HCT: 36.6 % (ref 36.0–46.0)
Hemoglobin: 12.6 g/dL (ref 12.0–15.0)
LYMPHS ABS: 1.7 10*3/uL (ref 0.7–4.0)
Lymphocytes Relative: 20 % (ref 12–46)
MCH: 29.6 pg (ref 26.0–34.0)
MCHC: 34.4 g/dL (ref 30.0–36.0)
MCV: 86.1 fL (ref 78.0–100.0)
MONO ABS: 0.9 10*3/uL (ref 0.1–1.0)
MONOS PCT: 10 % (ref 3–12)
Neutro Abs: 5.8 10*3/uL (ref 1.7–7.7)
Neutrophils Relative %: 67 % (ref 43–77)
Platelets: 257 10*3/uL (ref 150–400)
RBC: 4.25 MIL/uL (ref 3.87–5.11)
RDW: 13.6 % (ref 11.5–15.5)
WBC: 8.7 10*3/uL (ref 4.0–10.5)

## 2014-01-26 LAB — BASIC METABOLIC PANEL
BUN: 18 mg/dL (ref 6–23)
CHLORIDE: 86 meq/L — AB (ref 96–112)
CO2: 29 mEq/L (ref 19–32)
Calcium: 9.8 mg/dL (ref 8.4–10.5)
Creat: 1.42 mg/dL — ABNORMAL HIGH (ref 0.50–1.10)
Glucose, Bld: 83 mg/dL (ref 70–99)
POTASSIUM: 4.5 meq/L (ref 3.5–5.3)
Sodium: 122 mEq/L — ABNORMAL LOW (ref 135–145)

## 2014-02-02 ENCOUNTER — Encounter: Payer: Medicare Other | Admitting: Internal Medicine

## 2014-02-16 ENCOUNTER — Ambulatory Visit (INDEPENDENT_AMBULATORY_CARE_PROVIDER_SITE_OTHER): Payer: Medicare Other | Admitting: Internal Medicine

## 2014-02-16 ENCOUNTER — Encounter: Payer: Self-pay | Admitting: Internal Medicine

## 2014-02-16 VITALS — BP 140/96 | HR 63 | Ht 67.0 in | Wt 227.0 lb

## 2014-02-16 DIAGNOSIS — I428 Other cardiomyopathies: Secondary | ICD-10-CM | POA: Diagnosis not present

## 2014-02-16 DIAGNOSIS — G459 Transient cerebral ischemic attack, unspecified: Secondary | ICD-10-CM | POA: Diagnosis not present

## 2014-02-16 DIAGNOSIS — I10 Essential (primary) hypertension: Secondary | ICD-10-CM

## 2014-02-16 DIAGNOSIS — I5022 Chronic systolic (congestive) heart failure: Secondary | ICD-10-CM

## 2014-02-16 DIAGNOSIS — Z95810 Presence of automatic (implantable) cardiac defibrillator: Secondary | ICD-10-CM | POA: Diagnosis not present

## 2014-02-16 LAB — MDC_IDC_ENUM_SESS_TYPE_INCLINIC
Battery Remaining Longevity: 102 mo
Battery Voltage: 3.03 V
Brady Statistic AP VP Percent: 2.31 %
Brady Statistic AS VP Percent: 95.66 %
Brady Statistic AS VS Percent: 2.02 %
Brady Statistic RV Percent Paced: 95.85 %
Date Time Interrogation Session: 20150803115355
HIGH POWER IMPEDANCE MEASURED VALUE: 190 Ohm
HighPow Impedance: 75 Ohm
Lead Channel Impedance Value: 456 Ohm
Lead Channel Impedance Value: 475 Ohm
Lead Channel Pacing Threshold Amplitude: 0.75 V
Lead Channel Pacing Threshold Pulse Width: 0.4 ms
Lead Channel Pacing Threshold Pulse Width: 0.4 ms
Lead Channel Sensing Intrinsic Amplitude: 1.75 mV
Lead Channel Sensing Intrinsic Amplitude: 7.625 mV
Lead Channel Setting Pacing Amplitude: 1.75 V
Lead Channel Setting Pacing Amplitude: 2 V
Lead Channel Setting Pacing Pulse Width: 0.4 ms
Lead Channel Setting Sensing Sensitivity: 0.3 mV
MDC IDC MSMT LEADCHNL LV PACING THRESHOLD AMPLITUDE: 0.5 V
MDC IDC MSMT LEADCHNL LV PACING THRESHOLD PULSEWIDTH: 0.4 ms
MDC IDC MSMT LEADCHNL RA PACING THRESHOLD AMPLITUDE: 0.5 V
MDC IDC MSMT LEADCHNL RA SENSING INTR AMPL: 1 mV
MDC IDC MSMT LEADCHNL RV SENSING INTR AMPL: 10.875 mV
MDC IDC SET LEADCHNL RA PACING AMPLITUDE: 1.5 V
MDC IDC SET LEADCHNL RV PACING PULSEWIDTH: 0.4 ms
MDC IDC SET ZONE DETECTION INTERVAL: 350 ms
MDC IDC STAT BRADY AP VS PERCENT: 0.01 %
MDC IDC STAT BRADY RA PERCENT PACED: 2.32 %
Zone Setting Detection Interval: 300 ms
Zone Setting Detection Interval: 360 ms
Zone Setting Detection Interval: 450 ms

## 2014-02-16 NOTE — Patient Instructions (Signed)
Your physician recommends that you schedule a follow-up appointment in:  3 months with Dr Taylor  Your physician recommends that you continue on your current medications as directed. Please refer to the Current Medication list given to you today.    

## 2014-02-16 NOTE — Assessment & Plan Note (Signed)
I checked her blood pressure today. She was dizzy. Her blood pressure was 134/84 on my check.

## 2014-02-16 NOTE — Assessment & Plan Note (Signed)
She has persistent tenderness without any evidence of infection. Hopefully this will improve. Her device is working normally.

## 2014-02-16 NOTE — Progress Notes (Signed)
HPI [name] returns today for followup. She is a pleasant 73 yo woman who has LBBB, chronic systolic heart failure and newly diagnosed atrial fib who was started on systemic anti-coagulation several months ago. When I saw her last she was not feeling well and we adjusted her meds and started her on a diuretic. Her insertion site is still sore but no worse and she has had no drainaged or erythema. Her main complaint is tenderness around her incision. She notes that with prior surgeries it took months before she had healed completely. Her dyspnea is improved but still not as good as she would like. She denies palpitations.  Allergies  Allergen Reactions  . Ciprofloxacin Cough  . Dexlansoprazole     Lower abdominal pain.  . Statins Other (See Comments)    Arthralgias during treatment with at least 2 members of his class      Current Outpatient Prescriptions  Medication Sig Dispense Refill  . benazepril (LOTENSIN) 40 MG tablet Take 40 mg by mouth every morning.      . carvedilol (COREG) 25 MG tablet Takes one-half tablet in the morning and one tablet in the evening/at bedtime  60 tablet  6  . cholecalciferol (VITAMIN D) 1000 UNITS tablet Take 1,000 Units by mouth daily.      . fexofenadine (ALLEGRA) 180 MG tablet Take 180 mg by mouth every morning.       . fluticasone (FLONASE) 50 MCG/ACT nasal spray Place 2 sprays into the nose daily as needed for allergies.       . furosemide (LASIX) 40 MG tablet Take 1 tablet (40 mg total) by mouth daily.  30 tablet  6  . lansoprazole (PREVACID) 15 MG capsule Take 15 mg by mouth every morning.       Marland Kitchen levothyroxine (SYNTHROID) 100 MCG tablet Take 100 mcg by mouth every morning.       . Multiple Vitamins-Minerals (CENTRUM SILVER PO) Take 1 tablet by mouth daily.       . rivaroxaban (XARELTO) 20 MG TABS tablet Take 1 tablet (20 mg total) by mouth daily with supper.  30 tablet  6  . spironolactone (ALDACTONE) 25 MG tablet Take 12.5 mg by mouth every  other day.      . traMADol (ULTRAM) 50 MG tablet Take 50 mg by mouth every 6 (six) hours as needed for moderate pain.        No current facility-administered medications for this visit.     Past Medical History  Diagnosis Date  . Hyperlipemia   . Hypertension   . Degenerative joint disease     Right hip and knee  . Hypothyroid   . 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 GERD (gastroesophageal reflux disease)   . S/P colonoscopy 2009    Dr. Sharlett Iles: reportedly normal per pt  . S/P endoscopy 1980s    per pt; normal. performed secondary to reflux  . Cerebrovascular accident     No clinical event; asymptomatic CT finding  . LBBB (left bundle branch block)     ROS:   All systems reviewed and negative except as noted in the HPI.   Past Surgical History  Procedure Laterality Date  . Thyroidectomy      neoplasm  . Cholecystectomy    . Appendectomy    . Abdominal hysterectomy    . Breast biopsy  bilaterally; benign  . Mandible surgery      secondary to malocclusion  . Ureteral surgery  1970  . Colonoscopy  2009  . Esophagogastroduodenoscopy  07/13/2011    small hh, patchy gastric erythema with scarring deformity of antrum, chronic gastritis, bx benign with no H.Pylori. Procedure: ESOPHAGOGASTRODUODENOSCOPY (EGD);  Surgeon: Daneil Dolin, MD;  Location: [facility];  Service: Endoscopy;  Laterality: N/A;  10:45  . Can not have mri due to jaw surgery    . Bi-ventricular implantable cardioverter defibrillator  (crt-d)  10-31-2013    MDT Hillery Aldo CRTD implanted by Dr Lovena Le     Family History  Problem Relation Age of Onset  . Brain cancer Mother   . Atrial fibrillation Other   . Colon cancer Neg Hx      History   Social History  . Marital Status: Widowed    Spouse Name: N/A    Number of Children: 2  . Years of Education: N/A   Occupational History  .  Not on file.   Social History Main Topics  . Smoking status: Never Smoker   . Smokeless tobacco: Never Used  . Alcohol Use: No  . Drug Use: No  . Sexual Activity: Not on file   Other Topics Concern  . Not on file   Social History Narrative   Widowed           BP 140/96  Pulse 63  Ht 5\' 7"  (1.702 m)  Wt 227 lb (102.967 kg)  BMI 35.55 kg/m2  SpO2 97%  Physical Exam:  Well appearing 73 yo woman, NAD HEENT: Unremarkable Neck:  No JVD, no thyromegally Lymphatics:  No adenopathy Back:  No CVA tenderness Lungs:  Clear with no wheezes. She has no erythema around her incision but she does have tenderness without fluctuance. HEART:  Regular rate rhythm, no murmurs, no rubs, no clicks Abd:  soft, positive bowel sounds, no organomegally, no rebound, no guarding Ext:  2 plus pulses, no edema, no cyanosis, no clubbing Skin:  No rashes no nodules Neuro:  CN II through XII intact, motor grossly intact   DEVICE  Normal device function.  See PaceArt for details.   Assess/Plan:

## 2014-02-16 NOTE — Assessment & Plan Note (Signed)
Her symptoms remain class 2. Will follow. She will maintain a low sodium diet.

## 2014-02-20 ENCOUNTER — Telehealth: Payer: Self-pay | Admitting: *Deleted

## 2014-02-20 NOTE — Telephone Encounter (Signed)
Pt wants to know if it would be okay to have a cortisone shot in her elbow. She doesn't want it to interfere with her cardiac problumes she has been having lately.  If she doesn't answer phone please leave yes or no on her machine

## 2014-02-20 NOTE — Telephone Encounter (Signed)
I told pt she could have cortisone shot in shoulder

## 2014-02-23 ENCOUNTER — Ambulatory Visit (INDEPENDENT_AMBULATORY_CARE_PROVIDER_SITE_OTHER): Payer: Medicare Other | Admitting: Cardiovascular Disease

## 2014-02-23 ENCOUNTER — Encounter: Payer: Self-pay | Admitting: Cardiovascular Disease

## 2014-02-23 VITALS — BP 132/72 | HR 58 | Ht 67.0 in | Wt 220.0 lb

## 2014-02-23 DIAGNOSIS — G459 Transient cerebral ischemic attack, unspecified: Secondary | ICD-10-CM

## 2014-02-23 DIAGNOSIS — I10 Essential (primary) hypertension: Secondary | ICD-10-CM

## 2014-02-23 DIAGNOSIS — R5381 Other malaise: Secondary | ICD-10-CM | POA: Diagnosis not present

## 2014-02-23 DIAGNOSIS — R42 Dizziness and giddiness: Secondary | ICD-10-CM

## 2014-02-23 DIAGNOSIS — I4891 Unspecified atrial fibrillation: Secondary | ICD-10-CM

## 2014-02-23 DIAGNOSIS — E039 Hypothyroidism, unspecified: Secondary | ICD-10-CM

## 2014-02-23 DIAGNOSIS — Z95810 Presence of automatic (implantable) cardiac defibrillator: Secondary | ICD-10-CM

## 2014-02-23 DIAGNOSIS — I5022 Chronic systolic (congestive) heart failure: Secondary | ICD-10-CM | POA: Diagnosis not present

## 2014-02-23 DIAGNOSIS — R5383 Other fatigue: Secondary | ICD-10-CM

## 2014-02-23 DIAGNOSIS — I428 Other cardiomyopathies: Secondary | ICD-10-CM

## 2014-02-23 DIAGNOSIS — E785 Hyperlipidemia, unspecified: Secondary | ICD-10-CM

## 2014-02-23 NOTE — Patient Instructions (Signed)
Your physician recommends that you schedule a follow-up appointment in: 6 months with Dr Virgina Jock will receive a reminder letter two months in advance reminding you to call and schedule your appointment. If you don't receive this letter, please contact our office.  Your physician recommends that you return for lab work today. TSH, CBC, BMET  Your physician recommends that you continue on your current medications as directed. Please refer to the Current Medication list given to you today.

## 2014-02-23 NOTE — Progress Notes (Signed)
Patient ID: [name], female   DOB: December 17, 1940, 73 y.o.   MRN: [id number]      SUBJECTIVE: The patient is here for routine cardiovascular followup. She has a history of a nonischemic myopathy s/p BiV ICD, hypertension, relatively newly diagnosed atrial fibrillation and hyperlipidemia. An echocardiogram in July 2014 revealed an ejection fraction of 30%. Device check on 8/3 did not demonstrate any mode switches.  She has not been feeling well and wonders if it is due to her Lasix, although she says she no longer has leg swelling after starting this. She has had dizziness, but denies chest pain. She has not had any bleeding problems with Xarelto.  She has been feeling fatigued and has had a cough and nasal congestion.  BP today 132/78, HR 58 bpm. HR at home 50-70 bpm.   Review of Systems: As per "subjective", otherwise negative.  Allergies  Allergen Reactions  . Ciprofloxacin Cough  . Dexlansoprazole     Lower abdominal pain.  . Statins Other (See Comments)    Arthralgias during treatment with at least 2 members of his class     Current Outpatient Prescriptions  Medication Sig Dispense Refill  . benazepril (LOTENSIN) 40 MG tablet Take 40 mg by mouth every morning.      . carvedilol (COREG) 25 MG tablet Takes one-half tablet in the morning and one tablet in the evening/at bedtime  60 tablet  6  . cholecalciferol (VITAMIN D) 1000 UNITS tablet Take 1,000 Units by mouth daily.      . fexofenadine (ALLEGRA) 180 MG tablet Take 180 mg by mouth every morning.       . fluticasone (FLONASE) 50 MCG/ACT nasal spray Place 2 sprays into the nose daily as needed for allergies.       . furosemide (LASIX) 40 MG tablet Take 1 tablet (40 mg total) by mouth daily.  30 tablet  6  . lansoprazole (PREVACID) 15 MG capsule Take 15 mg by mouth every morning.       Marland Kitchen levothyroxine (SYNTHROID) 100 MCG tablet Take 100 mcg by mouth every morning.       . Multiple Vitamins-Minerals (CENTRUM SILVER PO) Take 1  tablet by mouth daily.       . rivaroxaban (XARELTO) 20 MG TABS tablet Take 1 tablet (20 mg total) by mouth daily with supper.  30 tablet  6  . spironolactone (ALDACTONE) 25 MG tablet Take 12.5 mg by mouth every other day.      . traMADol (ULTRAM) 50 MG tablet Take 50 mg by mouth every 6 (six) hours as needed for moderate pain.        No current facility-administered medications for this visit.    Past Medical History  Diagnosis Date  . Hyperlipemia   . Hypertension   . Degenerative joint disease     Right hip and knee  . Hypothyroid   . 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 GERD (gastroesophageal reflux disease)   . S/P colonoscopy 2009    Dr. Sharlett Iles: reportedly normal per pt  . S/P endoscopy 1980s    per pt; normal. performed secondary to reflux  . Cerebrovascular accident     No clinical event; asymptomatic CT finding  . LBBB (left bundle branch block)     Past Surgical History  Procedure Laterality Date  . Thyroidectomy      neoplasm  .  Cholecystectomy    . Appendectomy    . Abdominal hysterectomy    . Breast biopsy      bilaterally; benign  . Mandible surgery      secondary to malocclusion  . Ureteral surgery  1970  . Colonoscopy  2009  . Esophagogastroduodenoscopy  07/13/2011    small hh, patchy gastric erythema with scarring deformity of antrum, chronic gastritis, bx benign with no H.Pylori. Procedure: ESOPHAGOGASTRODUODENOSCOPY (EGD);  Surgeon: Daneil Dolin, MD;  Location: [facility];  Service: Endoscopy;  Laterality: N/A;  10:45  . Can not have mri due to jaw surgery    . Bi-ventricular implantable cardioverter defibrillator  (crt-d)  10-31-2013    MDT Hillery Aldo CRTD implanted by Dr Lovena Le    History   Social History  . Marital Status: Widowed    Spouse Name: N/A    Number of Children: 2  . Years of Education: N/A   Occupational History  .  Not on file.   Social History Main Topics  . Smoking status: Never Smoker   . Smokeless tobacco: Never Used  . Alcohol Use: No  . Drug Use: No  . Sexual Activity: Not on file   Other Topics Concern  . Not on file   Social History Narrative   Widowed          Vitals as noted above.  PHYSICAL EXAM General: NAD Neck: No JVD, no thyromegaly. Lungs: Clear to auscultation bilaterally with normal respiratory effort. CV: Nondisplaced PMI.  Regular rate and rhythm, normal S1/S2, no S3/S4, no murmur. No pretibial or periankle edema.  No carotid bruit.  Normal pedal pulses.  Abdomen: Soft, nontender, no hepatosplenomegaly, no distention.  Neurologic: Alert and oriented x 3.  Psych: Normal affect. Extremities: No clubbing or cyanosis.   ECG: reviewed and available in electronic records.      ASSESSMENT AND PLAN: 1. Nonischemic cardiomyopathy/chronic systolic heart failure with BiV ICD: She appears to be euvolemic and compensated. Continue current doses of benazepril, carvedilol, Lasix, and spironolactone. Will check BMET. 2. HTN: Well controlled today. No medication adjustments. 3. Atrial fibrillation: Remains on Xarelto. No recent mode switches. 4. Fatigue and dizziness: Will check a BMET, CBC, and TSH, given her history of being on diuretics and having hypothyroidism, respectively. She may have an upper respiratory tract infection.  Dispo: f/u 6 months.  Kate Sable, M.D., F.A.C.C.

## 2014-02-24 LAB — BASIC METABOLIC PANEL
BUN: 13 mg/dL (ref 6–23)
CALCIUM: 9.4 mg/dL (ref 8.4–10.5)
CO2: 29 mEq/L (ref 19–32)
CREATININE: 1 mg/dL (ref 0.50–1.10)
Chloride: 96 mEq/L (ref 96–112)
Glucose, Bld: 95 mg/dL (ref 70–99)
POTASSIUM: 4 meq/L (ref 3.5–5.3)
SODIUM: 136 meq/L (ref 135–145)

## 2014-02-24 LAB — CBC
HCT: 39.1 % (ref 36.0–46.0)
Hemoglobin: 13.3 g/dL (ref 12.0–15.0)
MCH: 29.8 pg (ref 26.0–34.0)
MCHC: 34 g/dL (ref 30.0–36.0)
MCV: 87.7 fL (ref 78.0–100.0)
PLATELETS: 253 10*3/uL (ref 150–400)
RBC: 4.46 MIL/uL (ref 3.87–5.11)
RDW: 14.1 % (ref 11.5–15.5)
WBC: 10.6 10*3/uL — ABNORMAL HIGH (ref 4.0–10.5)

## 2014-02-24 LAB — TSH: TSH: 2.925 u[IU]/mL (ref 0.350–4.500)

## 2014-02-25 DIAGNOSIS — I509 Heart failure, unspecified: Secondary | ICD-10-CM | POA: Diagnosis not present

## 2014-02-25 DIAGNOSIS — R42 Dizziness and giddiness: Secondary | ICD-10-CM | POA: Diagnosis not present

## 2014-02-25 DIAGNOSIS — J019 Acute sinusitis, unspecified: Secondary | ICD-10-CM | POA: Diagnosis not present

## 2014-06-01 DIAGNOSIS — Z23 Encounter for immunization: Secondary | ICD-10-CM | POA: Diagnosis not present

## 2014-06-15 ENCOUNTER — Other Ambulatory Visit: Payer: Self-pay | Admitting: Internal Medicine

## 2014-06-15 DIAGNOSIS — I4891 Unspecified atrial fibrillation: Secondary | ICD-10-CM

## 2014-06-15 MED ORDER — CARVEDILOL 25 MG PO TABS: ORAL_TABLET | ORAL | Status: DC

## 2014-06-15 MED ORDER — RIVAROXABAN 20 MG PO TABS: 20.0000 mg | ORAL_TABLET | Freq: Every day | ORAL | Status: DC

## 2014-06-15 NOTE — Telephone Encounter (Signed)
Received fax refill request  Rx # [id number] Medication:  Xarelto 20 mg tab Qty 30 Sig:  Take one tablet by mouth once daily with supper Physician:  Lovena Le  Received fax refill request  Rx # [id number] Medication:  Carvedilol 25 mg tab Qty 60 Sig:  Take one-half tablet by mouth in the morning and one tablet in the evening at bedtime Physician:  Bronson Ing

## 2014-06-16 DIAGNOSIS — H2513 Age-related nuclear cataract, bilateral: Secondary | ICD-10-CM | POA: Diagnosis not present

## 2014-06-18 ENCOUNTER — Ambulatory Visit (INDEPENDENT_AMBULATORY_CARE_PROVIDER_SITE_OTHER): Payer: Medicare Other | Admitting: Internal Medicine

## 2014-06-18 ENCOUNTER — Encounter: Payer: Self-pay | Admitting: Internal Medicine

## 2014-06-18 VITALS — BP 130/78 | HR 67 | Ht 67.0 in | Wt 236.0 lb

## 2014-06-18 DIAGNOSIS — I480 Paroxysmal atrial fibrillation: Secondary | ICD-10-CM | POA: Diagnosis not present

## 2014-06-18 DIAGNOSIS — I5022 Chronic systolic (congestive) heart failure: Secondary | ICD-10-CM | POA: Diagnosis not present

## 2014-06-18 DIAGNOSIS — I255 Ischemic cardiomyopathy: Secondary | ICD-10-CM

## 2014-06-18 DIAGNOSIS — Z95810 Presence of automatic (implantable) cardiac defibrillator: Secondary | ICD-10-CM

## 2014-06-18 LAB — MDC_IDC_ENUM_SESS_TYPE_INCLINIC
Battery Remaining Longevity: 96 mo
Battery Remaining Longevity: 96 mo
Brady Statistic AP VS Percent: 0.01 %
Brady Statistic AP VS Percent: 0.01 %
Brady Statistic AS VP Percent: 94.86 %
Brady Statistic AS VS Percent: 2.51 %
Brady Statistic AS VS Percent: 2.51 %
Brady Statistic RA Percent Paced: 2.63 %
Brady Statistic RV Percent Paced: 95.24 %
Brady Statistic RV Percent Paced: 95.24 %
Date Time Interrogation Session: 20151203141155
Date Time Interrogation Session: 20151203141453
HighPow Impedance: 190 Ohm
HighPow Impedance: 190 Ohm
HighPow Impedance: 73 Ohm
HighPow Impedance: 73 Ohm
Lead Channel Impedance Value: 456 Ohm
Lead Channel Impedance Value: 456 Ohm
Lead Channel Impedance Value: 475 Ohm
Lead Channel Pacing Threshold Amplitude: 0.5 V
Lead Channel Pacing Threshold Amplitude: 0.5 V
Lead Channel Pacing Threshold Amplitude: 0.75 V
Lead Channel Pacing Threshold Amplitude: 0.75 V
Lead Channel Pacing Threshold Pulse Width: 0.4 ms
Lead Channel Pacing Threshold Pulse Width: 0.4 ms
Lead Channel Pacing Threshold Pulse Width: 0.4 ms
Lead Channel Sensing Intrinsic Amplitude: 0.875 mV
Lead Channel Sensing Intrinsic Amplitude: 0.875 mV
Lead Channel Sensing Intrinsic Amplitude: 9.125 mV
Lead Channel Sensing Intrinsic Amplitude: 9.125 mV
Lead Channel Setting Pacing Amplitude: 1.75 V
Lead Channel Setting Pacing Amplitude: 1.75 V
Lead Channel Setting Pacing Amplitude: 2 V
Lead Channel Setting Pacing Amplitude: 2.5 V
Lead Channel Setting Pacing Pulse Width: 0.4 ms
Lead Channel Setting Pacing Pulse Width: 0.4 ms
Lead Channel Setting Sensing Sensitivity: 0.3 mV
MDC IDC MSMT BATTERY VOLTAGE: 3.01 V
MDC IDC MSMT BATTERY VOLTAGE: 3.01 V
MDC IDC MSMT LEADCHNL LV PACING THRESHOLD PULSEWIDTH: 0.4 ms
MDC IDC MSMT LEADCHNL RA PACING THRESHOLD PULSEWIDTH: 0.4 ms
MDC IDC MSMT LEADCHNL RV IMPEDANCE VALUE: 475 Ohm
MDC IDC MSMT LEADCHNL RV PACING THRESHOLD AMPLITUDE: 0.75 V
MDC IDC MSMT LEADCHNL RV PACING THRESHOLD AMPLITUDE: 0.75 V
MDC IDC MSMT LEADCHNL RV PACING THRESHOLD PULSEWIDTH: 0.4 ms
MDC IDC SET LEADCHNL LV PACING PULSEWIDTH: 0.4 ms
MDC IDC SET LEADCHNL RA PACING AMPLITUDE: 2 V
MDC IDC SET LEADCHNL RV PACING AMPLITUDE: 2.5 V
MDC IDC SET LEADCHNL RV PACING PULSEWIDTH: 0.4 ms
MDC IDC SET LEADCHNL RV SENSING SENSITIVITY: 0.3 mV
MDC IDC SET ZONE DETECTION INTERVAL: 450 ms
MDC IDC STAT BRADY AP VP PERCENT: 2.62 %
MDC IDC STAT BRADY AP VP PERCENT: 2.62 %
MDC IDC STAT BRADY AS VP PERCENT: 94.86 %
MDC IDC STAT BRADY RA PERCENT PACED: 2.63 %
Zone Setting Detection Interval: 300 ms
Zone Setting Detection Interval: 300 ms
Zone Setting Detection Interval: 350 ms
Zone Setting Detection Interval: 350 ms
Zone Setting Detection Interval: 360 ms
Zone Setting Detection Interval: 360 ms
Zone Setting Detection Interval: 450 ms

## 2014-06-18 NOTE — Patient Instructions (Addendum)
Your physician wants you to follow-up in: [place] Lovena Le.  Your physician recommends that you continue on your current medications as directed. Please refer to the Current Medication list given to you today.  WE HAVE GIVEN YOU XARELTO SAMPLES  Thank you for choosing [facility]!!

## 2014-06-18 NOTE — Assessment & Plan Note (Signed)
She will continue her current meds. She is maintaining NSR over 99.9% of the time. She is still taking her Xarelto but bothered by the cost when she goes into the donut hole.

## 2014-06-18 NOTE — Progress Notes (Signed)
HPI Mrs. Rincon returns today for followup. She is a pleasant 73 yo woman who has LBBB, chronic systolic heart failure and newly diagnosed atrial fib who was started on systemic anti-coagulation several months ago.  Her ICD insertion site is still sore but is improving and she has had no drainage or erythema. She notes that with prior surgeries it took months before she had healed completely. Her dyspnea is improved but still not as good as she would like. She denies palpitations.  Allergies  Allergen Reactions  . Ciprofloxacin Cough  . Dexlansoprazole     Lower abdominal pain.  . Statins Other (See Comments)    Arthralgias during treatment with at least 2 members of his class      Current Outpatient Prescriptions  Medication Sig Dispense Refill  . benazepril (LOTENSIN) 40 MG tablet Take 40 mg by mouth every morning.    . carvedilol (COREG) 25 MG tablet Takes one-half tablet in the morning and one tablet in the evening/at bedtime 60 tablet 3  . cholecalciferol (VITAMIN D) 1000 UNITS tablet Take 1,000 Units by mouth daily.    . fexofenadine (ALLEGRA) 180 MG tablet Take 180 mg by mouth every morning.     . fluticasone (FLONASE) 50 MCG/ACT nasal spray Place 2 sprays into the nose daily as needed for allergies.     . furosemide (LASIX) 40 MG tablet Take 1 tablet (40 mg total) by mouth daily. 30 tablet 6  . lansoprazole (PREVACID) 15 MG capsule Take 15 mg by mouth every morning.     Marland Kitchen levothyroxine (SYNTHROID) 100 MCG tablet Take 100 mcg by mouth every morning.     . Multiple Vitamins-Minerals (CENTRUM SILVER PO) Take 1 tablet by mouth daily.     . rivaroxaban (XARELTO) 20 MG TABS tablet Take 1 tablet (20 mg total) by mouth daily with supper. 30 tablet 3  . spironolactone (ALDACTONE) 25 MG tablet Take 12.5 mg by mouth every other day.    . traMADol (ULTRAM) 50 MG tablet Take 50 mg by mouth every 6 (six) hours as needed for moderate pain.      No current facility-administered  medications for this visit.     Past Medical History  Diagnosis Date  . Hyperlipemia   . Hypertension   . Degenerative joint disease     Right hip and knee  . Hypothyroid   . 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 GERD (gastroesophageal reflux disease)   . S/P colonoscopy 2009    Dr. Sharlett Iles: reportedly normal per pt  . S/P endoscopy 1980s    per pt; normal. performed secondary to reflux  . Cerebrovascular accident     No clinical event; asymptomatic CT finding  . LBBB (left bundle branch block)     ROS:   All systems reviewed and negative except as noted in the HPI.   Past Surgical History  Procedure Laterality Date  . Thyroidectomy      neoplasm  . Cholecystectomy    . Appendectomy    . Abdominal hysterectomy    . Breast biopsy      bilaterally; benign  . Mandible surgery      secondary to malocclusion  . Ureteral surgery  1970  . Colonoscopy  2009  . Esophagogastroduodenoscopy  07/13/2011    small hh, patchy gastric erythema with scarring deformity of antrum, chronic gastritis, bx  benign with no H.Pylori. Procedure: ESOPHAGOGASTRODUODENOSCOPY (EGD);  Surgeon: Daneil Dolin, MD;  Location: [facility];  Service: Endoscopy;  Laterality: N/A;  10:45  . Can not have mri due to jaw surgery    . Bi-ventricular implantable cardioverter defibrillator  (crt-d)  10-31-2013    MDT Hillery Aldo CRTD implanted by Dr Lovena Le     Family History  Problem Relation Age of Onset  . Brain cancer Mother   . Atrial fibrillation Other   . Colon cancer Neg Hx      History   Social History  . Marital Status: Widowed    Spouse Name: N/A    Number of Children: 2  . Years of Education: N/A   Occupational History  . Not on file.   Social History Main Topics  . Smoking status: Never Smoker   . Smokeless tobacco: Never Used  . Alcohol Use: No  . Drug Use: No  . Sexual  Activity: Not on file   Other Topics Concern  . Not on file   Social History Narrative   Widowed           BP 130/78 mmHg  Pulse 67  Ht 5\' 7"  (1.702 m)  Wt 236 lb (107.049 kg)  BMI 36.95 kg/m2  Physical Exam:  Well appearing 73 yo woman, NAD HEENT: Unremarkable Neck:  No JVD, no thyromegally Lymphatics:  No adenopathy Back:  No CVA tenderness Lungs:  Clear with no wheezes. She has no erythema around her incision but she does have tenderness without fluctuance. HEART:  Regular rate rhythm, no murmurs, no rubs, no clicks Abd:  soft, positive bowel sounds, no organomegally, no rebound, no guarding Ext:  2 plus pulses, no edema, no cyanosis, no clubbing Skin:  No rashes no nodules Neuro:  CN II through XII intact, motor grossly intact   DEVICE  Normal device function.  See PaceArt for details.   Assess/Plan:

## 2014-06-18 NOTE — Assessment & Plan Note (Signed)
Her symptoms are class 2B. She will continue her current meds. I have asked the patient to increase her physical activity. Will follow.

## 2014-06-18 NOTE — Assessment & Plan Note (Signed)
Her BiV ICD appears to be working normally. Hopefully her incisional pain will continue to improve.

## 2014-06-19 ENCOUNTER — Encounter: Payer: Self-pay | Admitting: Internal Medicine

## 2014-06-19 LAB — PACEMAKER DEVICE OBSERVATION

## 2014-06-25 ENCOUNTER — Encounter (HOSPITAL_COMMUNITY): Payer: Self-pay | Admitting: Internal Medicine

## 2014-06-26 DIAGNOSIS — I429 Cardiomyopathy, unspecified: Secondary | ICD-10-CM | POA: Diagnosis not present

## 2014-06-26 DIAGNOSIS — I10 Essential (primary) hypertension: Secondary | ICD-10-CM | POA: Diagnosis not present

## 2014-06-26 DIAGNOSIS — M19012 Primary osteoarthritis, left shoulder: Secondary | ICD-10-CM | POA: Diagnosis not present

## 2014-06-26 DIAGNOSIS — E785 Hyperlipidemia, unspecified: Secondary | ICD-10-CM | POA: Diagnosis not present

## 2014-07-02 ENCOUNTER — Telehealth: Payer: Self-pay | Admitting: *Deleted

## 2014-07-02 NOTE — Telephone Encounter (Signed)
Pt states that her home device machine is not working and she wants to know when we received the last transmition.

## 2014-07-02 NOTE — Telephone Encounter (Signed)
Spoke with pt and informed her that her monitor was working.

## 2014-08-14 ENCOUNTER — Other Ambulatory Visit: Payer: Self-pay | Admitting: *Deleted

## 2014-08-14 MED ORDER — FUROSEMIDE 40 MG PO TABS: 40.0000 mg | ORAL_TABLET | Freq: Every day | ORAL | Status: DC

## 2014-08-14 NOTE — Telephone Encounter (Signed)
Pt needs furosemide called in today she doesn't have enough to get her through weekend. She states that [facility] in [place] has been sending ti to [place] with no response

## 2014-08-14 NOTE — Telephone Encounter (Signed)
escribed lasix to [facility].original rx refill was going to [facility] office

## 2014-09-07 ENCOUNTER — Ambulatory Visit (INDEPENDENT_AMBULATORY_CARE_PROVIDER_SITE_OTHER): Payer: Medicare Other | Admitting: Cardiovascular Disease

## 2014-09-07 ENCOUNTER — Encounter: Payer: Self-pay | Admitting: Cardiovascular Disease

## 2014-09-07 VITALS — BP 126/82 | HR 71 | Ht 67.0 in | Wt 226.0 lb

## 2014-09-07 DIAGNOSIS — I429 Cardiomyopathy, unspecified: Secondary | ICD-10-CM

## 2014-09-07 DIAGNOSIS — I5022 Chronic systolic (congestive) heart failure: Secondary | ICD-10-CM

## 2014-09-07 DIAGNOSIS — Z95810 Presence of automatic (implantable) cardiac defibrillator: Secondary | ICD-10-CM

## 2014-09-07 DIAGNOSIS — R079 Chest pain, unspecified: Secondary | ICD-10-CM

## 2014-09-07 DIAGNOSIS — I10 Essential (primary) hypertension: Secondary | ICD-10-CM | POA: Diagnosis not present

## 2014-09-07 DIAGNOSIS — I480 Paroxysmal atrial fibrillation: Secondary | ICD-10-CM | POA: Diagnosis not present

## 2014-09-07 DIAGNOSIS — M25512 Pain in left shoulder: Secondary | ICD-10-CM | POA: Diagnosis not present

## 2014-09-07 NOTE — Progress Notes (Signed)
Patient ID: [name], female   DOB: 05-25-1941, 74 y.o.   MRN: [id number]      SUBJECTIVE: The patient is here for routine cardiovascular followup. She has a history of a nonischemic cardiomyopathy s/p BiV ICD, hypertension, atrial fibrillation and hyperlipidemia. An echocardiogram in July 2014 revealed an ejection fraction of 30%. Device check on 06/18/14 did not demonstrate any mode switches, no ventricular arrhythmias, and 95% biventricular pacing.  She had an episode of palpitations once last week and felt fatigued afterward. This is the first time this has happened in several months. She denies shortness of breath. She has significant left pectoral chest pain where the ICD was placed. She also complains of pain radiating down her left shoulder and arm which only began after device placement. She takes tramadol 3 times per day for this. She is unable to participate in physical therapy as she has right hip and knee osteoarthritic problems.  She takes half a tablet of Coreg in the morning and a full tablet in the evening as taking a full tablet in the morning caused her to feel drowsy.   Review of Systems: As per "subjective", otherwise negative.  Allergies  Allergen Reactions  . Ciprofloxacin Cough  . Dexlansoprazole     Lower abdominal pain.  . Statins Other (See Comments)    Arthralgias during treatment with at least 2 members of his class     Current Outpatient Prescriptions  Medication Sig Dispense Refill  . benazepril (LOTENSIN) 40 MG tablet Take 40 mg by mouth every morning.    . carvedilol (COREG) 25 MG tablet Takes one-half tablet in the morning and one tablet in the evening/at bedtime 60 tablet 3  . cholecalciferol (VITAMIN D) 1000 UNITS tablet Take 1,000 Units by mouth daily.    . fexofenadine (ALLEGRA) 180 MG tablet Take 180 mg by mouth every morning.     . fluticasone (FLONASE) 50 MCG/ACT nasal spray Place 2 sprays into the nose daily as needed for allergies.     .  furosemide (LASIX) 40 MG tablet Take 1 tablet (40 mg total) by mouth daily. 30 tablet 6  . lansoprazole (PREVACID) 15 MG capsule Take 15 mg by mouth every morning.     Marland Kitchen levothyroxine (SYNTHROID) 100 MCG tablet Take 100 mcg by mouth every morning.     . Multiple Vitamins-Minerals (CENTRUM SILVER PO) Take 1 tablet by mouth daily.     . rivaroxaban (XARELTO) 20 MG TABS tablet Take 1 tablet (20 mg total) by mouth daily with supper. 30 tablet 3  . spironolactone (ALDACTONE) 25 MG tablet Take 12.5 mg by mouth every other day.    . traMADol (ULTRAM) 50 MG tablet Take 50 mg by mouth every 6 (six) hours as needed for moderate pain.      No current facility-administered medications for this visit.    Past Medical History  Diagnosis Date  . Hyperlipemia   . Hypertension   . Degenerative joint disease     Right hip and knee  . Hypothyroid   . 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 GERD (gastroesophageal reflux disease)   . S/P colonoscopy 2009    Dr. Sharlett Iles: reportedly normal per pt  . S/P endoscopy 1980s    per pt; normal. performed secondary to reflux  . Cerebrovascular accident     No clinical event; asymptomatic CT finding  . LBBB (  left bundle branch block)     Past Surgical History  Procedure Laterality Date  . Thyroidectomy      neoplasm  . Cholecystectomy    . Appendectomy    . Abdominal hysterectomy    . Breast biopsy      bilaterally; benign  . Mandible surgery      secondary to malocclusion  . Ureteral surgery  1970  . Colonoscopy  2009  . Esophagogastroduodenoscopy  07/13/2011    small hh, patchy gastric erythema with scarring deformity of antrum, chronic gastritis, bx benign with no H.Pylori. Procedure: ESOPHAGOGASTRODUODENOSCOPY (EGD);  Surgeon: Daneil Dolin, MD;  [facility];  Service: Endoscopy;  Laterality: N/A;  10:45  . Can not have mri due to  jaw surgery    . Bi-ventricular implantable cardioverter defibrillator  (crt-d)  10-31-2013    MDT Hillery Aldo CRTD implanted by Dr Lovena Le  . Implantable cardioverter defibrillator implant N/A 10/31/2013    Procedure: IMPLANTABLE CARDIOVERTER DEFIBRILLATOR IMPLANT;  Surgeon: Evans Lance, MD;  Location: [facility] CATH LAB;  Service: Cardiovascular;  Laterality: N/A;    History   Social History  . Marital Status: Widowed    Spouse Name: N/A  . Number of Children: 2  . Years of Education: N/A   Occupational History  . Not on file.   Social History Main Topics  . Smoking status: Never Smoker   . Smokeless tobacco: Never Used  . Alcohol Use: No  . Drug Use: No  . Sexual Activity: Not on file   Other Topics Concern  . Not on file   Social History Narrative   Widowed           Filed Vitals:   09/07/14 1502  Height: 5\' 7"  (1.702 m)  Weight: 226 lb (102.513 kg)   BP 126/82  Pulse 71  SpO2 96%    PHYSICAL EXAM General: NAD HEENT: Normal. Neck: No JVD, no thyromegaly. Lungs: Clear to auscultation bilaterally with normal respiratory effort. CV: Nondisplaced PMI.  Regular rate and rhythm with occasional premature contractions, normal S1/S2, no S3/S4, no murmur. No pretibial or periankle edema.  No carotid bruit.   Abdomen: Soft, nontender, obese, no distention.  Neurologic: Alert and oriented x 3.  Psych: Normal affect. Skin: Normal. Musculoskeletal: Pain reproducible with left shoulder painful arc test when raising. Bilateral acromioclavicular joint tenderness. Extremities: No clubbing or cyanosis.   ECG: Most recent ECG reviewed.      ASSESSMENT AND PLAN: 1. Nonischemic cardiomyopathy/chronic systolic heart failure with BiV ICD: She appears to be euvolemic and compensated. Continue current doses of benazepril, carvedilol, Lasix, and spironolactone.  2. Essential HTN: Well controlled today. No medication adjustments. 3. Atrial fibrillation: Remains on Xarelto. No recent  mode switches. Had one episode of palpitations last week. 4. BiV ICD in situ: Interrogation from 06/18/14 noted above. Follows with Dr. Lovena Le. 5. Left shoulder pain: Does have bilateral acromioclavicular joint erosion. Unable to participate in physical therapy.  Dispo: f/u 1 year.   Kate Sable, M.D., F.A.C.C.

## 2014-09-07 NOTE — Patient Instructions (Addendum)
Your physician wants you to follow-up in: 1 year with Dr. Bronson Ing. You will receive a reminder letter in the mail two months in advance. If you don't receive a letter, please call our office to schedule the follow-up appointment.  Your physician recommends that you continue on your current medications as directed. Please refer to the Current Medication list given to you today.  You have been give samples of Xarelto today  Thank you for choosing [facility]!

## 2014-09-14 ENCOUNTER — Other Ambulatory Visit: Payer: Self-pay | Admitting: Cardiovascular Disease

## 2014-09-14 MED ORDER — BENAZEPRIL HCL 40 MG PO TABS: 40.0000 mg | ORAL_TABLET | Freq: Every morning | ORAL | Status: DC

## 2014-09-14 NOTE — Telephone Encounter (Signed)
escribed rx 

## 2014-09-14 NOTE — Telephone Encounter (Signed)
Received fax refill request  Rx # [id number] Medication:  Benazepril 40 mg tab Qty 90 Sig:  Take one tablet by mouth once daily Physician:  Bronson Ing

## 2014-10-05 ENCOUNTER — Encounter: Payer: Self-pay | Admitting: Family Medicine

## 2014-10-05 DIAGNOSIS — K210 Gastro-esophageal reflux disease with esophagitis: Secondary | ICD-10-CM | POA: Diagnosis not present

## 2014-10-05 DIAGNOSIS — M19012 Primary osteoarthritis, left shoulder: Secondary | ICD-10-CM | POA: Diagnosis not present

## 2014-10-05 DIAGNOSIS — I429 Cardiomyopathy, unspecified: Secondary | ICD-10-CM | POA: Diagnosis not present

## 2014-10-05 DIAGNOSIS — I10 Essential (primary) hypertension: Secondary | ICD-10-CM | POA: Diagnosis not present

## 2014-10-14 DIAGNOSIS — Z1231 Encounter for screening mammogram for malignant neoplasm of breast: Secondary | ICD-10-CM | POA: Diagnosis not present

## 2014-10-14 LAB — HM MAMMOGRAPHY

## 2014-11-04 ENCOUNTER — Ambulatory Visit (INDEPENDENT_AMBULATORY_CARE_PROVIDER_SITE_OTHER): Payer: Medicare Other | Admitting: Internal Medicine

## 2014-11-04 ENCOUNTER — Encounter: Payer: Self-pay | Admitting: Internal Medicine

## 2014-11-04 VITALS — BP 128/70 | HR 65 | Ht 67.0 in | Wt 232.1 lb

## 2014-11-04 DIAGNOSIS — I480 Paroxysmal atrial fibrillation: Secondary | ICD-10-CM | POA: Diagnosis not present

## 2014-11-04 DIAGNOSIS — I5022 Chronic systolic (congestive) heart failure: Secondary | ICD-10-CM

## 2014-11-04 DIAGNOSIS — Z95810 Presence of automatic (implantable) cardiac defibrillator: Secondary | ICD-10-CM

## 2014-11-04 LAB — MDC_IDC_ENUM_SESS_TYPE_INCLINIC
Battery Remaining Longevity: 87 mo
Battery Voltage: 2.95 V
Brady Statistic AP VP Percent: 3.36 %
Brady Statistic RV Percent Paced: 94.74 %
Date Time Interrogation Session: 20160420120510
HIGH POWER IMPEDANCE MEASURED VALUE: 190 Ohm
HIGH POWER IMPEDANCE MEASURED VALUE: 72 Ohm
Lead Channel Impedance Value: 418 Ohm
Lead Channel Pacing Threshold Amplitude: 0.5 V
Lead Channel Pacing Threshold Amplitude: 0.75 V
Lead Channel Pacing Threshold Amplitude: 0.75 V
Lead Channel Pacing Threshold Pulse Width: 0.4 ms
Lead Channel Sensing Intrinsic Amplitude: 10.375 mV
Lead Channel Setting Pacing Amplitude: 2 V
Lead Channel Setting Pacing Amplitude: 2.5 V
Lead Channel Setting Pacing Pulse Width: 0.4 ms
MDC IDC MSMT LEADCHNL LV PACING THRESHOLD PULSEWIDTH: 0.4 ms
MDC IDC MSMT LEADCHNL RA SENSING INTR AMPL: 1.25 mV
MDC IDC MSMT LEADCHNL RV IMPEDANCE VALUE: 418 Ohm
MDC IDC MSMT LEADCHNL RV PACING THRESHOLD PULSEWIDTH: 0.4 ms
MDC IDC SET LEADCHNL LV PACING PULSEWIDTH: 0.4 ms
MDC IDC SET LEADCHNL RA PACING AMPLITUDE: 2 V
MDC IDC SET LEADCHNL RV SENSING SENSITIVITY: 0.3 mV
MDC IDC SET ZONE DETECTION INTERVAL: 300 ms
MDC IDC SET ZONE DETECTION INTERVAL: 450 ms
MDC IDC STAT BRADY AP VS PERCENT: 0.02 %
MDC IDC STAT BRADY AS VP PERCENT: 93.98 %
MDC IDC STAT BRADY AS VS PERCENT: 2.65 %
MDC IDC STAT BRADY RA PERCENT PACED: 3.37 %
Zone Setting Detection Interval: 350 ms
Zone Setting Detection Interval: 360 ms

## 2014-11-04 NOTE — Patient Instructions (Signed)
Your physician wants you to follow-up in: 1 year with Dr. Lovena Le. You will receive a reminder letter in the mail two months in advance. If you don't receive a letter, please call our office to schedule the follow-up appointment.  Your physician recommends that you continue on your current medications as directed. Please refer to the Current Medication list given to you today.  Remote monitoring is used to monitor your Pacemaker of ICD from home. This monitoring reduces the number of office visits required to check your device to one time per year. It allows [place] to keep an eye on the functioning of your device to ensure it is working properly. You are scheduled for a device check from home on 02/03/15. You may send your transmission at any time that day. If you have a wireless device, the transmission will be sent automatically. After your physician reviews your transmission, you will receive a postcard with your next transmission date.  Thank you for choosing [facility]!

## 2014-11-04 NOTE — Assessment & Plan Note (Signed)
Her symptoms are class 2. She will continue her current meds. She is encouraged to lose weight and reduce her sodium intake.

## 2014-11-04 NOTE — Progress Notes (Signed)
HPI [name] returns today for followup. She is a pleasant 74 yo woman who has LBBB, chronic systolic heart failure and newly diagnosed atrial fib who was started on systemic anti-coagulation several months ago.  Her ICD insertion site is sore but is improving slowly and she has had no drainage or erythema. Her dyspnea is improved but still not as good as she would like. She denies palpitations. She has been bothered by arthritis. Allergies  Allergen Reactions  . Ciprofloxacin Cough  . Dexlansoprazole     Lower abdominal pain.  . Statins Other (See Comments)    Arthralgias during treatment with at least 2 members of his class      Current Outpatient Prescriptions  Medication Sig Dispense Refill  . benazepril (LOTENSIN) 40 MG tablet Take 1 tablet (40 mg total) by mouth every morning. 90 tablet 3  . carvedilol (COREG) 25 MG tablet Takes one-half tablet in the morning and one tablet in the evening/at bedtime 60 tablet 3  . cholecalciferol (VITAMIN D) 1000 UNITS tablet Take 1,000 Units by mouth daily.    . fexofenadine (ALLEGRA) 180 MG tablet Take 180 mg by mouth every morning.     . fluticasone (FLONASE) 50 MCG/ACT nasal spray Place 2 sprays into the nose daily as needed for allergies.     . furosemide (LASIX) 40 MG tablet Take 1 tablet (40 mg total) by mouth daily. 30 tablet 6  . lansoprazole (PREVACID) 15 MG capsule Take 15 mg by mouth every morning.     Marland Kitchen levothyroxine (SYNTHROID) 100 MCG tablet Take 100 mcg by mouth every morning.     . Multiple Vitamins-Minerals (CENTRUM SILVER PO) Take 1 tablet by mouth daily.     . rivaroxaban (XARELTO) 20 MG TABS tablet Take 1 tablet (20 mg total) by mouth daily with supper. 30 tablet 3  . spironolactone (ALDACTONE) 25 MG tablet Take 12.5 mg by mouth every other day.    . traMADol (ULTRAM) 50 MG tablet Take 50 mg by mouth every 6 (six) hours as needed for moderate pain.      No current facility-administered medications for this visit.      Past Medical History  Diagnosis Date  . Hyperlipemia   . Hypertension   . Degenerative joint disease     Right hip and knee  . Hypothyroid   . 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 GERD (gastroesophageal reflux disease)   . S/P colonoscopy 2009    Dr. Sharlett Iles: reportedly normal per pt  . S/P endoscopy 1980s    per pt; normal. performed secondary to reflux  . Cerebrovascular accident     No clinical event; asymptomatic CT finding  . LBBB (left bundle branch block)     ROS:   All systems reviewed and negative except as noted in the HPI.   Past Surgical History  Procedure Laterality Date  . Thyroidectomy      neoplasm  . Cholecystectomy    . Appendectomy    . Abdominal hysterectomy    . Breast biopsy      bilaterally; benign  . Mandible surgery      secondary to malocclusion  . Ureteral surgery  1970  . Colonoscopy  2009  . Esophagogastroduodenoscopy  07/13/2011    small hh, patchy gastric erythema with scarring deformity of antrum, chronic gastritis, bx benign with no H.Pylori. Procedure:  ESOPHAGOGASTRODUODENOSCOPY (EGD);  Surgeon: Daneil Dolin, MD;  Location: [facility];  Service: Endoscopy;  Laterality: N/A;  10:45  . Can not have mri due to jaw surgery    . Bi-ventricular implantable cardioverter defibrillator  (crt-d)  10-31-2013    MDT Hillery Aldo CRTD implanted by Dr Lovena Le  . Implantable cardioverter defibrillator implant N/A 10/31/2013    Procedure: IMPLANTABLE CARDIOVERTER DEFIBRILLATOR IMPLANT;  Surgeon: Evans Lance, MD;  Location: [facility] CATH LAB;  Service: Cardiovascular;  Laterality: N/A;     Family History  Problem Relation Age of Onset  . Brain cancer Mother   . Atrial fibrillation Other   . Colon cancer Neg Hx      History   Social History  . Marital Status: Widowed    Spouse Name: N/A  . Number of Children: 2  . Years of  Education: N/A   Occupational History  . Not on file.   Social History Main Topics  . Smoking status: Never Smoker   . Smokeless tobacco: Never Used  . Alcohol Use: No  . Drug Use: No  . Sexual Activity: Not on file   Other Topics Concern  . Not on file   Social History Narrative   Widowed           BP 128/70 mmHg  Pulse 65  Ht 5\' 7"  (1.702 m)  Wt 232 lb 2 oz (105.291 kg)  BMI 36.35 kg/m2  SpO2 98%  Physical Exam:  Well appearing 74 yo woman, NAD HEENT: Unremarkable Neck:  No JVD, no thyromegally Back:  No CVA tenderness Lungs:  Clear with no wheezes. She has no erythema around her incision but she does have tenderness at the bottom of the can. HEART:  Regular rate rhythm, no murmurs, no rubs, no clicks Abd:  soft, positive bowel sounds, no organomegally, no rebound, no guarding Ext:  2 plus pulses, no edema, no cyanosis, no clubbing Skin:  No rashes no nodules Neuro:  CN II through XII intact, motor grossly intact   DEVICE  Normal device function.  See PaceArt for details.   Assess/Plan:

## 2014-11-04 NOTE — Assessment & Plan Note (Signed)
She is maintaining NSR 99.9% of the time. No change in medications. Will follow.

## 2014-11-04 NOTE — Assessment & Plan Note (Signed)
Her Medtronic BiV ICD is working normally. Will recheck in several months.

## 2014-11-05 DIAGNOSIS — M7062 Trochanteric bursitis, left hip: Secondary | ICD-10-CM | POA: Diagnosis not present

## 2014-11-10 ENCOUNTER — Encounter: Payer: Self-pay | Admitting: Obstetrics and Gynecology

## 2014-11-16 ENCOUNTER — Other Ambulatory Visit: Payer: Self-pay

## 2014-11-16 MED ORDER — CARVEDILOL 25 MG PO TABS: ORAL_TABLET | ORAL | Status: DC

## 2014-11-26 DIAGNOSIS — M25512 Pain in left shoulder: Secondary | ICD-10-CM | POA: Diagnosis not present

## 2014-11-26 DIAGNOSIS — M25552 Pain in left hip: Secondary | ICD-10-CM | POA: Diagnosis not present

## 2014-12-03 ENCOUNTER — Ambulatory Visit: Payer: Medicare Other | Admitting: Internal Medicine

## 2014-12-11 ENCOUNTER — Other Ambulatory Visit: Payer: Self-pay | Admitting: Cardiology

## 2014-12-11 ENCOUNTER — Other Ambulatory Visit: Payer: Self-pay

## 2014-12-11 MED ORDER — SPIRONOLACTONE 25 MG PO TABS
12.5000 mg | ORAL_TABLET | ORAL | Status: DC
Start: 2014-12-11 — End: 2016-04-05

## 2014-12-11 NOTE — Telephone Encounter (Signed)
Refill complete 

## 2014-12-18 IMAGING — CR DG CHEST 2V
2 series · 2 of 2 positions shown · non-contrast
Comparison: Two-view chest 06/05/2011.

CLINICAL DATA: Shortness of breath.  Congestive heart failure.

CHEST - 2 VIEW

[view not recorded (1 of 2)]
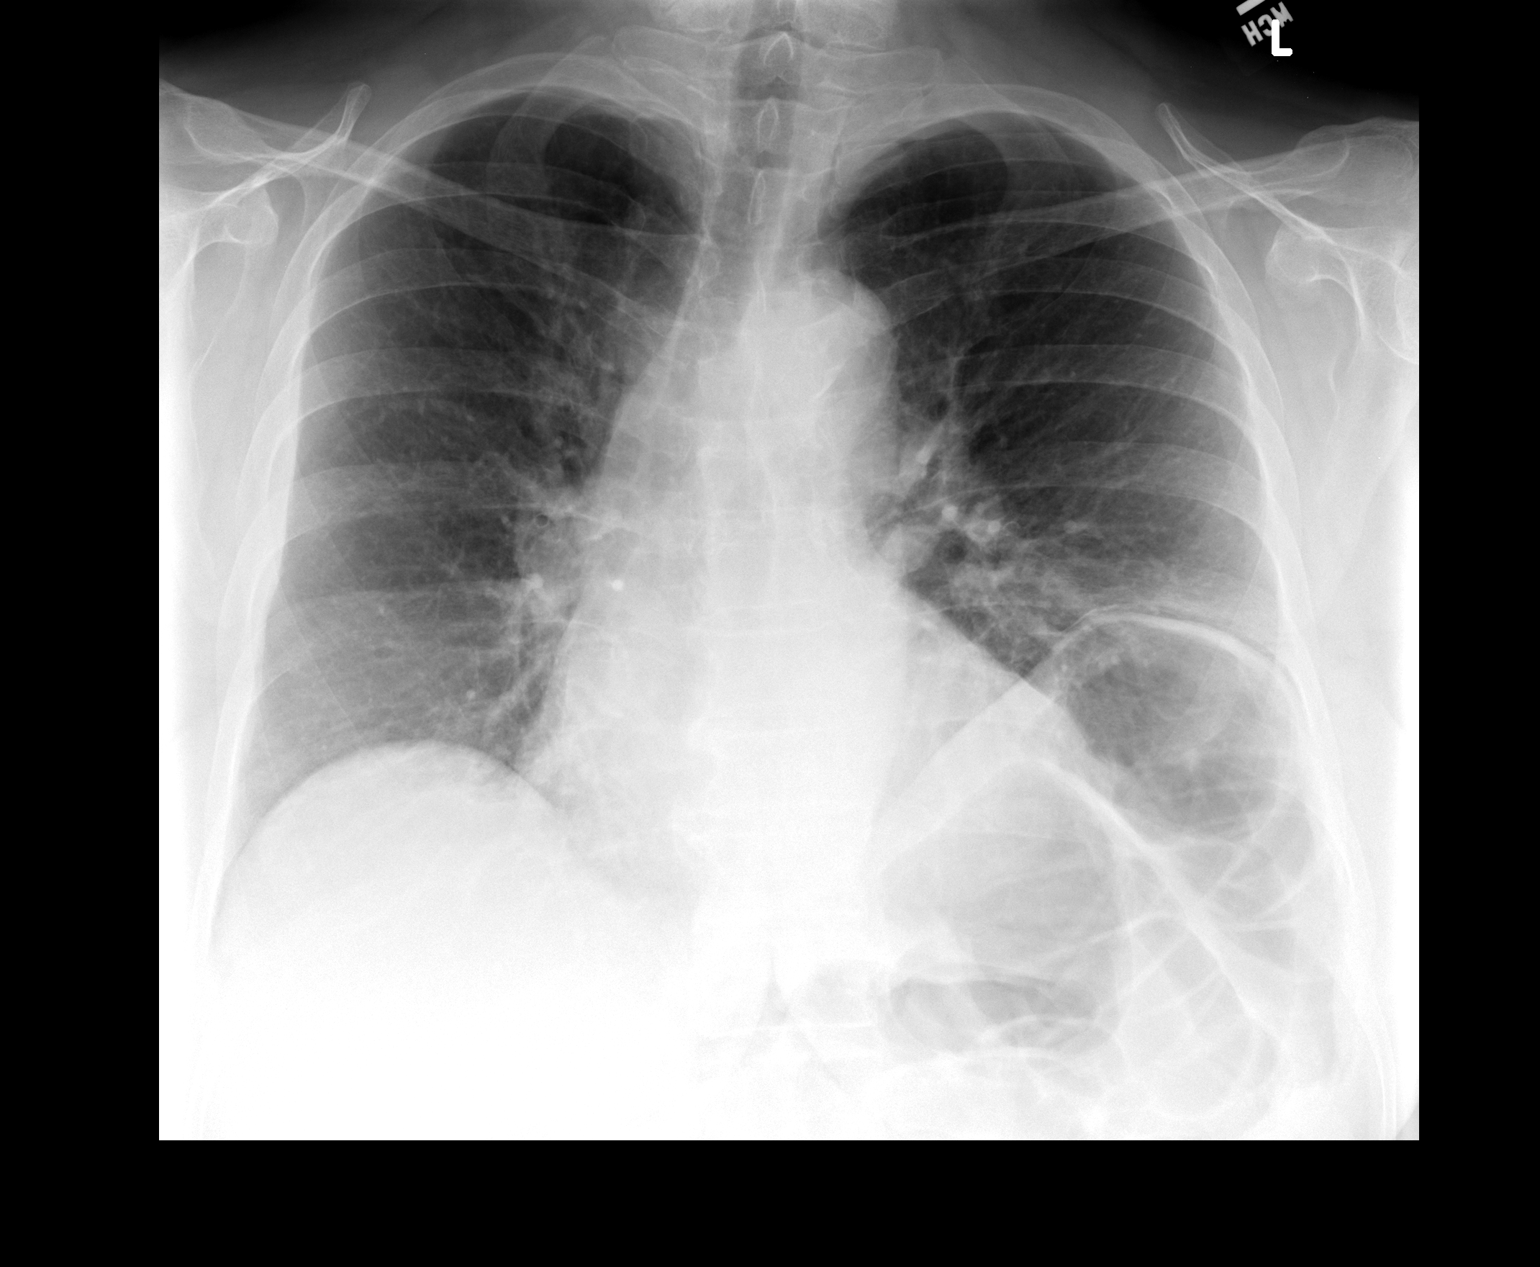

[view not recorded (2 of 2)]
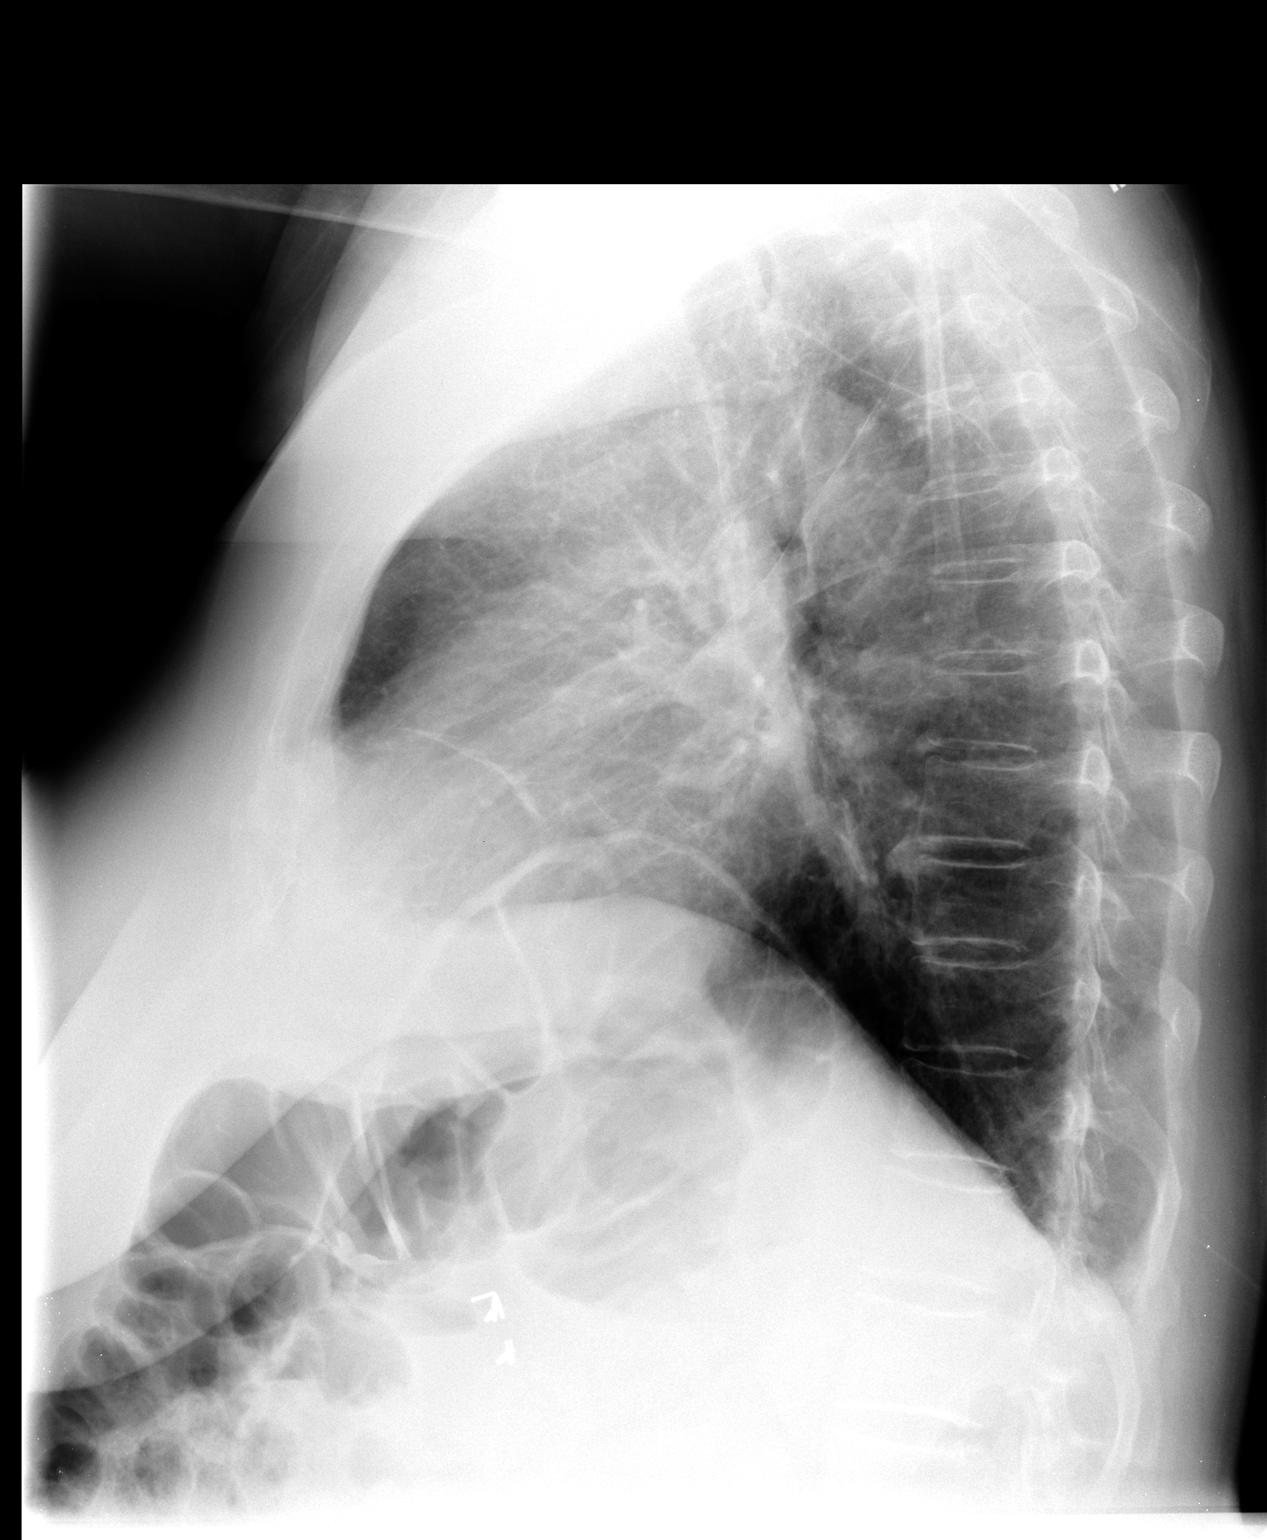

[2 of 2 positions shown; findings below may reference images not displayed]

FINDINGS: The heart is mildly enlarged.  The left hemidiaphragm is
chronically elevated.  Mild left basilar atelectasis is now
present.  There is no significant edema or effusion to suggest
failure.  The upper lung fields are clear.  Surgical clips are
present at the gallbladder fossa.
IMPRESSION: 1.  Chronic elevation of the left hemidiaphragm with minimal left
basilar atelectasis.
2.  No acute cardiopulmonary disease.

## 2015-01-11 DIAGNOSIS — I10 Essential (primary) hypertension: Secondary | ICD-10-CM | POA: Diagnosis not present

## 2015-01-11 DIAGNOSIS — I429 Cardiomyopathy, unspecified: Secondary | ICD-10-CM | POA: Diagnosis not present

## 2015-01-11 DIAGNOSIS — E785 Hyperlipidemia, unspecified: Secondary | ICD-10-CM | POA: Diagnosis not present

## 2015-01-11 DIAGNOSIS — I4891 Unspecified atrial fibrillation: Secondary | ICD-10-CM | POA: Diagnosis not present

## 2015-01-19 ENCOUNTER — Other Ambulatory Visit: Payer: Self-pay

## 2015-01-19 DIAGNOSIS — I4891 Unspecified atrial fibrillation: Secondary | ICD-10-CM

## 2015-01-19 MED ORDER — RIVAROXABAN 20 MG PO TABS: 20.0000 mg | ORAL_TABLET | Freq: Every day | ORAL | Status: DC

## 2015-01-19 NOTE — Telephone Encounter (Signed)
Refill complete 

## 2015-01-21 DIAGNOSIS — M25551 Pain in right hip: Secondary | ICD-10-CM | POA: Diagnosis not present

## 2015-02-03 ENCOUNTER — Ambulatory Visit (INDEPENDENT_AMBULATORY_CARE_PROVIDER_SITE_OTHER): Payer: Medicare Other | Admitting: *Deleted

## 2015-02-03 ENCOUNTER — Encounter: Payer: Self-pay | Admitting: Internal Medicine

## 2015-02-03 DIAGNOSIS — I5022 Chronic systolic (congestive) heart failure: Secondary | ICD-10-CM

## 2015-02-03 DIAGNOSIS — I429 Cardiomyopathy, unspecified: Secondary | ICD-10-CM

## 2015-02-03 NOTE — Progress Notes (Signed)
Remote ICD transmission.   

## 2015-02-12 LAB — CUP PACEART REMOTE DEVICE CHECK
Battery Remaining Longevity: 85 mo
Battery Voltage: 2.93 V
Brady Statistic AP VP Percent: 3.83 %
Brady Statistic AP VS Percent: 0.02 %
Brady Statistic AS VP Percent: 93.29 %
Brady Statistic AS VS Percent: 2.86 %
Brady Statistic RA Percent Paced: 3.85 %
Brady Statistic RV Percent Paced: 93.9 %
HighPow Impedance: 77 Ohm
Lead Channel Impedance Value: 361 Ohm
Lead Channel Impedance Value: 418 Ohm
Lead Channel Impedance Value: 456 Ohm
Lead Channel Impedance Value: 513 Ohm
Lead Channel Impedance Value: 703 Ohm
Lead Channel Impedance Value: 703 Ohm
Lead Channel Impedance Value: 722 Ohm
Lead Channel Impedance Value: 760 Ohm
Lead Channel Pacing Threshold Amplitude: 0.625 V
Lead Channel Pacing Threshold Amplitude: 0.75 V
Lead Channel Pacing Threshold Pulse Width: 0.4 ms
Lead Channel Pacing Threshold Pulse Width: 0.4 ms
Lead Channel Sensing Intrinsic Amplitude: 1.375 mV
Lead Channel Sensing Intrinsic Amplitude: 7.875 mV
Lead Channel Sensing Intrinsic Amplitude: 7.875 mV
Lead Channel Setting Pacing Amplitude: 2 V
Lead Channel Setting Pacing Amplitude: 2.5 V
Lead Channel Setting Pacing Pulse Width: 0.4 ms
Lead Channel Setting Sensing Sensitivity: 0.3 mV
MDC IDC MSMT LEADCHNL LV IMPEDANCE VALUE: 456 Ohm
MDC IDC MSMT LEADCHNL LV IMPEDANCE VALUE: 475 Ohm
MDC IDC MSMT LEADCHNL LV IMPEDANCE VALUE: 817 Ohm
MDC IDC MSMT LEADCHNL RA IMPEDANCE VALUE: 456 Ohm
MDC IDC MSMT LEADCHNL RA PACING THRESHOLD PULSEWIDTH: 0.4 ms
MDC IDC MSMT LEADCHNL RA SENSING INTR AMPL: 1.375 mV
MDC IDC MSMT LEADCHNL RV IMPEDANCE VALUE: 399 Ohm
MDC IDC MSMT LEADCHNL RV PACING THRESHOLD AMPLITUDE: 0.5 V
MDC IDC SESS DTM: 20160720041704
MDC IDC SET LEADCHNL LV PACING AMPLITUDE: 1.75 V
MDC IDC SET LEADCHNL RV PACING PULSEWIDTH: 0.4 ms
MDC IDC SET ZONE DETECTION INTERVAL: 360 ms
Zone Setting Detection Interval: 300 ms
Zone Setting Detection Interval: 350 ms
Zone Setting Detection Interval: 450 ms

## 2015-02-19 ENCOUNTER — Encounter: Payer: Self-pay | Admitting: Cardiology

## 2015-02-25 DIAGNOSIS — M25551 Pain in right hip: Secondary | ICD-10-CM | POA: Diagnosis not present

## 2015-02-25 DIAGNOSIS — M545 Low back pain: Secondary | ICD-10-CM | POA: Diagnosis not present

## 2015-02-25 DIAGNOSIS — M25561 Pain in right knee: Secondary | ICD-10-CM | POA: Diagnosis not present

## 2015-02-25 DIAGNOSIS — M25511 Pain in right shoulder: Secondary | ICD-10-CM | POA: Diagnosis not present

## 2015-02-25 DIAGNOSIS — M25512 Pain in left shoulder: Secondary | ICD-10-CM | POA: Diagnosis not present

## 2015-03-05 ENCOUNTER — Other Ambulatory Visit: Payer: Self-pay | Admitting: Surgery

## 2015-03-05 DIAGNOSIS — M545 Low back pain: Secondary | ICD-10-CM

## 2015-03-05 DIAGNOSIS — M25512 Pain in left shoulder: Secondary | ICD-10-CM

## 2015-03-11 ENCOUNTER — Other Ambulatory Visit: Payer: Self-pay | Admitting: Internal Medicine

## 2015-05-01 ENCOUNTER — Other Ambulatory Visit: Payer: Self-pay | Admitting: Cardiovascular Disease

## 2015-05-10 ENCOUNTER — Ambulatory Visit (INDEPENDENT_AMBULATORY_CARE_PROVIDER_SITE_OTHER): Payer: Medicare Other | Admitting: *Deleted

## 2015-05-10 DIAGNOSIS — I429 Cardiomyopathy, unspecified: Secondary | ICD-10-CM | POA: Diagnosis not present

## 2015-05-10 DIAGNOSIS — R233 Spontaneous ecchymoses: Secondary | ICD-10-CM | POA: Diagnosis not present

## 2015-05-10 DIAGNOSIS — I5022 Chronic systolic (congestive) heart failure: Secondary | ICD-10-CM | POA: Diagnosis not present

## 2015-05-10 DIAGNOSIS — Z23 Encounter for immunization: Secondary | ICD-10-CM | POA: Diagnosis not present

## 2015-05-10 DIAGNOSIS — I4891 Unspecified atrial fibrillation: Secondary | ICD-10-CM | POA: Diagnosis not present

## 2015-05-10 DIAGNOSIS — I10 Essential (primary) hypertension: Secondary | ICD-10-CM | POA: Diagnosis not present

## 2015-05-11 NOTE — Progress Notes (Signed)
Remote ICD transmission.   

## 2015-05-13 ENCOUNTER — Encounter: Payer: Self-pay | Admitting: Cardiology

## 2015-05-13 LAB — CUP PACEART REMOTE DEVICE CHECK
Battery Remaining Longevity: 79 mo
Brady Statistic AP VP Percent: 3.58 %
Brady Statistic AP VS Percent: 0.02 %
Brady Statistic AS VP Percent: 93.04 %
Brady Statistic RA Percent Paced: 3.59 %
Brady Statistic RV Percent Paced: 92.76 %
Date Time Interrogation Session: 20161024073624
HIGH POWER IMPEDANCE MEASURED VALUE: 73 Ohm
Implantable Lead Implant Date: 20150417
Implantable Lead Location: 753858
Implantable Lead Location: 753859
Implantable Lead Location: 753860
Implantable Lead Model: 4298
Implantable Lead Model: 5076
Implantable Lead Model: 6935
Lead Channel Impedance Value: 342 Ohm
Lead Channel Impedance Value: 342 Ohm
Lead Channel Impedance Value: 361 Ohm
Lead Channel Impedance Value: 361 Ohm
Lead Channel Impedance Value: 361 Ohm
Lead Channel Impedance Value: 399 Ohm
Lead Channel Impedance Value: 608 Ohm
Lead Channel Impedance Value: 646 Ohm
Lead Channel Impedance Value: 646 Ohm
Lead Channel Impedance Value: 646 Ohm
Lead Channel Pacing Threshold Amplitude: 0.625 V
Lead Channel Pacing Threshold Pulse Width: 0.4 ms
Lead Channel Sensing Intrinsic Amplitude: 1.625 mV
Lead Channel Sensing Intrinsic Amplitude: 1.625 mV
Lead Channel Sensing Intrinsic Amplitude: 7.875 mV
Lead Channel Sensing Intrinsic Amplitude: 7.875 mV
Lead Channel Setting Pacing Amplitude: 1.75 V
Lead Channel Setting Pacing Amplitude: 2 V
Lead Channel Setting Pacing Pulse Width: 0.4 ms
Lead Channel Setting Pacing Pulse Width: 0.4 ms
Lead Channel Setting Sensing Sensitivity: 0.3 mV
MDC IDC LEAD IMPLANT DT: 20150417
MDC IDC LEAD IMPLANT DT: 20150417
MDC IDC MSMT BATTERY VOLTAGE: 2.97 V
MDC IDC MSMT LEADCHNL LV IMPEDANCE VALUE: 418 Ohm
MDC IDC MSMT LEADCHNL LV IMPEDANCE VALUE: 589 Ohm
MDC IDC MSMT LEADCHNL LV PACING THRESHOLD AMPLITUDE: 0.75 V
MDC IDC MSMT LEADCHNL LV PACING THRESHOLD PULSEWIDTH: 0.4 ms
MDC IDC MSMT LEADCHNL RV IMPEDANCE VALUE: 361 Ohm
MDC IDC MSMT LEADCHNL RV PACING THRESHOLD AMPLITUDE: 0.5 V
MDC IDC MSMT LEADCHNL RV PACING THRESHOLD PULSEWIDTH: 0.4 ms
MDC IDC SET LEADCHNL RV PACING AMPLITUDE: 2.5 V
MDC IDC STAT BRADY AS VS PERCENT: 3.37 %

## 2015-08-09 ENCOUNTER — Ambulatory Visit (INDEPENDENT_AMBULATORY_CARE_PROVIDER_SITE_OTHER): Payer: Medicare Other | Admitting: *Deleted

## 2015-08-09 DIAGNOSIS — I429 Cardiomyopathy, unspecified: Secondary | ICD-10-CM

## 2015-08-09 DIAGNOSIS — I5022 Chronic systolic (congestive) heart failure: Secondary | ICD-10-CM | POA: Diagnosis not present

## 2015-08-10 NOTE — Progress Notes (Signed)
Remote ICD transmission.   

## 2015-08-11 LAB — CUP PACEART REMOTE DEVICE CHECK
Battery Voltage: 2.96 V
Brady Statistic AP VS Percent: 0.02 %
Brady Statistic RA Percent Paced: 3.56 %
Brady Statistic RV Percent Paced: 92.84 %
Date Time Interrogation Session: 20170123062404
HIGH POWER IMPEDANCE MEASURED VALUE: 74 Ohm
Implantable Lead Location: 753859
Implantable Lead Location: 753860
Implantable Lead Model: 4298
Implantable Lead Model: 6935
Lead Channel Impedance Value: 399 Ohm
Lead Channel Impedance Value: 399 Ohm
Lead Channel Impedance Value: 418 Ohm
Lead Channel Impedance Value: 418 Ohm
Lead Channel Impedance Value: 665 Ohm
Lead Channel Impedance Value: 703 Ohm
Lead Channel Pacing Threshold Amplitude: 0.875 V
Lead Channel Pacing Threshold Pulse Width: 0.4 ms
Lead Channel Pacing Threshold Pulse Width: 0.4 ms
Lead Channel Sensing Intrinsic Amplitude: 10.25 mV
Lead Channel Sensing Intrinsic Amplitude: 10.25 mV
Lead Channel Sensing Intrinsic Amplitude: 2 mV
Lead Channel Sensing Intrinsic Amplitude: 2 mV
Lead Channel Setting Pacing Amplitude: 2 V
Lead Channel Setting Pacing Pulse Width: 0.4 ms
MDC IDC LEAD IMPLANT DT: 20150417
MDC IDC LEAD IMPLANT DT: 20150417
MDC IDC LEAD IMPLANT DT: 20150417
MDC IDC LEAD LOCATION: 753858
MDC IDC MSMT BATTERY REMAINING LONGEVITY: 78 mo
MDC IDC MSMT LEADCHNL LV IMPEDANCE VALUE: 399 Ohm
MDC IDC MSMT LEADCHNL LV IMPEDANCE VALUE: 456 Ohm
MDC IDC MSMT LEADCHNL LV IMPEDANCE VALUE: 665 Ohm
MDC IDC MSMT LEADCHNL LV IMPEDANCE VALUE: 665 Ohm
MDC IDC MSMT LEADCHNL LV IMPEDANCE VALUE: 703 Ohm
MDC IDC MSMT LEADCHNL LV PACING THRESHOLD AMPLITUDE: 0.875 V
MDC IDC MSMT LEADCHNL LV PACING THRESHOLD PULSEWIDTH: 0.4 ms
MDC IDC MSMT LEADCHNL RA PACING THRESHOLD AMPLITUDE: 0.5 V
MDC IDC MSMT LEADCHNL RV IMPEDANCE VALUE: 361 Ohm
MDC IDC MSMT LEADCHNL RV IMPEDANCE VALUE: 418 Ohm
MDC IDC SET LEADCHNL RA PACING AMPLITUDE: 2 V
MDC IDC SET LEADCHNL RV PACING AMPLITUDE: 2.5 V
MDC IDC SET LEADCHNL RV PACING PULSEWIDTH: 0.4 ms
MDC IDC SET LEADCHNL RV SENSING SENSITIVITY: 0.3 mV
MDC IDC STAT BRADY AP VP PERCENT: 3.54 %
MDC IDC STAT BRADY AS VP PERCENT: 92.81 %
MDC IDC STAT BRADY AS VS PERCENT: 3.63 %

## 2015-08-18 ENCOUNTER — Encounter: Payer: Self-pay | Admitting: Cardiology

## 2015-09-08 ENCOUNTER — Other Ambulatory Visit: Payer: Self-pay | Admitting: Cardiovascular Disease

## 2015-09-10 DIAGNOSIS — R5383 Other fatigue: Secondary | ICD-10-CM | POA: Diagnosis not present

## 2015-09-10 DIAGNOSIS — E039 Hypothyroidism, unspecified: Secondary | ICD-10-CM | POA: Diagnosis not present

## 2015-09-10 DIAGNOSIS — M19012 Primary osteoarthritis, left shoulder: Secondary | ICD-10-CM | POA: Diagnosis not present

## 2015-09-10 DIAGNOSIS — I10 Essential (primary) hypertension: Secondary | ICD-10-CM | POA: Diagnosis not present

## 2015-09-20 ENCOUNTER — Encounter: Payer: Self-pay | Admitting: Cardiovascular Disease

## 2015-09-20 ENCOUNTER — Ambulatory Visit (INDEPENDENT_AMBULATORY_CARE_PROVIDER_SITE_OTHER): Payer: Medicare Other | Admitting: Cardiovascular Disease

## 2015-09-20 VITALS — BP 118/80 | HR 74 | Ht 68.0 in | Wt 225.0 lb

## 2015-09-20 DIAGNOSIS — Z95810 Presence of automatic (implantable) cardiac defibrillator: Secondary | ICD-10-CM

## 2015-09-20 DIAGNOSIS — I10 Essential (primary) hypertension: Secondary | ICD-10-CM

## 2015-09-20 DIAGNOSIS — I429 Cardiomyopathy, unspecified: Secondary | ICD-10-CM

## 2015-09-20 DIAGNOSIS — I5022 Chronic systolic (congestive) heart failure: Secondary | ICD-10-CM | POA: Diagnosis not present

## 2015-09-20 DIAGNOSIS — I4891 Unspecified atrial fibrillation: Secondary | ICD-10-CM

## 2015-09-20 NOTE — Progress Notes (Signed)
Patient ID: [name], female   DOB: 02-28-1941, 75 y.o.   MRN: [id number]      SUBJECTIVE: The patient is here for routine cardiovascular followup. She has a history of a nonischemic cardiomyopathy s/p BiV ICD, hypertension, atrial fibrillation and hyperlipidemia. An echocardiogram in July 2014 revealed an ejection fraction of 30%.  ECG performed in the office today demonstrates a ventricular paced rhythm.  She has not been hospitalized since her last visit with me. She has some exertional dyspnea when walking up a flight of stairs but not on level ground. She denies any significant leg swelling. She has chest pains related to her pacemaker but denies exertional chest tightness. Also denies PND.   Review of Systems: As per "subjective", otherwise negative.  Allergies  Allergen Reactions  . Ciprofloxacin Cough  . Dexlansoprazole     Lower abdominal pain.  . Statins Other (See Comments)    Arthralgias during treatment with at least 2 members of his class     Current Outpatient Prescriptions  Medication Sig Dispense Refill  . benazepril (LOTENSIN) 40 MG tablet TAKE ONE TABLET BY MOUTH EVERY MORNING 90 tablet 3  . carvedilol (COREG) 25 MG tablet TAKE ONE-HALF TABLET BY MOUTH IN THE MORNING AND ONE TABLET BY MOUTH IN THE EVENING/AT BEDTIME 60 tablet 6  . cholecalciferol (VITAMIN D) 1000 UNITS tablet Take 1,000 Units by mouth daily.    . fexofenadine (ALLEGRA) 180 MG tablet Take 180 mg by mouth every morning.     . fluticasone (FLONASE) 50 MCG/ACT nasal spray Place 2 sprays into the nose daily as needed for allergies.     . furosemide (LASIX) 40 MG tablet TAKE ONE TABLET BY MOUTH ONCE DAILY 30 tablet 6  . lansoprazole (PREVACID) 15 MG capsule Take 15 mg by mouth every morning.     Marland Kitchen levothyroxine (SYNTHROID) 100 MCG tablet Take 100 mcg by mouth every morning.     . Multiple Vitamins-Minerals (CENTRUM SILVER PO) Take 1 tablet by mouth daily.     . rivaroxaban (XARELTO) 20 MG TABS  tablet Take 1 tablet (20 mg total) by mouth daily with supper. 30 tablet 6  . spironolactone (ALDACTONE) 25 MG tablet Take 0.5 tablets (12.5 mg total) by mouth every other day. 45 tablet 3  . traMADol (ULTRAM) 50 MG tablet Take 50 mg by mouth every 6 (six) hours as needed for moderate pain.      No current facility-administered medications for this visit.    Past Medical History  Diagnosis Date  . Hyperlipemia   . Hypertension   . Degenerative joint disease     Right hip and knee  . Hypothyroid   . 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 GERD (gastroesophageal reflux disease)   . S/P colonoscopy 2009    Dr. Sharlett Iles: reportedly normal per pt  . S/P endoscopy 1980s    per pt; normal. performed secondary to reflux  . Cerebrovascular accident [facility])     No clinical event; asymptomatic CT finding  . LBBB (left bundle branch block)     Past Surgical History  Procedure Laterality Date  . Thyroidectomy      neoplasm  . Cholecystectomy    . Appendectomy    . Abdominal hysterectomy    . Breast biopsy      bilaterally; benign  . Mandible surgery      secondary to  malocclusion  . Ureteral surgery  1970  . Colonoscopy  2009  . Esophagogastroduodenoscopy  07/13/2011    small hh, patchy gastric erythema with scarring deformity of antrum, chronic gastritis, bx benign with no H.Pylori. Procedure: ESOPHAGOGASTRODUODENOSCOPY (EGD);  Surgeon: Daneil Dolin, MD;  [facility]: [facility];  Service: Endoscopy;  Laterality: N/A;  10:45  . Can not have mri due to jaw surgery    . Bi-ventricular implantable cardioverter defibrillator  (crt-d)  10-31-2013    MDT Hillery Aldo CRTD implanted by Dr Lovena Le  . Implantable cardioverter defibrillator implant N/A 10/31/2013    Procedure: IMPLANTABLE CARDIOVERTER DEFIBRILLATOR IMPLANT;  Surgeon: Evans Lance, MD;  Location: [facility] CATH LAB;  Service:  Cardiovascular;  Laterality: N/A;    Social History   Social History  . Marital Status: Widowed    Spouse Name: N/A  . Number of Children: 2  . Years of Education: N/A   Occupational History  . Not on file.   Social History Main Topics  . Smoking status: Never Smoker   . Smokeless tobacco: Never Used  . Alcohol Use: No  . Drug Use: No  . Sexual Activity: Not on file   Other Topics Concern  . Not on file   Social History Narrative   Widowed           Filed Vitals:   09/20/15 1453  BP: 118/80  Pulse: 74  Height: 5\' 8"  (1.727 m)  Weight: 225 lb (102.059 kg)  SpO2: 95%    PHYSICAL EXAM General: NAD HEENT: Normal. Neck: No JVD, no thyromegaly. Lungs: Clear to auscultation bilaterally with normal respiratory effort. CV: Nondisplaced PMI.  Regular rate and rhythm, normal S1/S2, no S3/S4, no murmur. No pretibial or periankle edema.     Abdomen: Soft, nontender, obese.  Neurologic: Alert and oriented.  Psych: Normal affect. Skin: Normal. Musculoskeletal: No gross deformities.  ECG: Most recent ECG reviewed.      ASSESSMENT AND PLAN: 1. Nonischemic cardiomyopathy/chronic systolic heart failure with BiV ICD: She appears to be euvolemic and compensated. Continue current doses of benazepril, carvedilol, Lasix, and spironolactone.   2. Essential HTN: Well controlled today. No medication adjustments.  3. Atrial fibrillation: Remains on Xarelto.   4. BiV ICD in situ:  Follows with Dr. Lovena Le.  Dispo: f/u 1 year.  Kate Sable, M.D., F.A.C.C.

## 2015-09-20 NOTE — Patient Instructions (Signed)
Your physician wants you to follow-up in:  1 year with Dr Koneswaran You will receive a reminder letter in the mail two months in advance. If you don't receive a letter, please call our office to schedule the follow-up appointment.    Your physician recommends that you continue on your current medications as directed. Please refer to the Current Medication list given to you today.    If you need a refill on your cardiac medications before your next appointment, please call your pharmacy.     Thank you for choosing [facility] !        

## 2015-10-10 ENCOUNTER — Other Ambulatory Visit: Payer: Self-pay | Admitting: Cardiovascular Disease

## 2015-10-29 DIAGNOSIS — Z1231 Encounter for screening mammogram for malignant neoplasm of breast: Secondary | ICD-10-CM | POA: Diagnosis not present

## 2015-11-17 ENCOUNTER — Encounter: Payer: Self-pay | Admitting: Internal Medicine

## 2015-11-17 ENCOUNTER — Ambulatory Visit (INDEPENDENT_AMBULATORY_CARE_PROVIDER_SITE_OTHER): Payer: Medicare Other | Admitting: Internal Medicine

## 2015-11-17 VITALS — BP 128/78 | HR 78 | Ht 67.0 in | Wt 234.0 lb

## 2015-11-17 DIAGNOSIS — I5022 Chronic systolic (congestive) heart failure: Secondary | ICD-10-CM

## 2015-11-17 LAB — CUP PACEART INCLINIC DEVICE CHECK
Brady Statistic AP VP Percent: 4 %
Brady Statistic AS VP Percent: 92.9 %
Date Time Interrogation Session: 20170503091456
HIGH POWER IMPEDANCE MEASURED VALUE: 74 Ohm
Implantable Lead Implant Date: 20150417
Implantable Lead Implant Date: 20150417
Implantable Lead Location: 753859
Implantable Lead Location: 753860
Implantable Lead Model: 4298
Implantable Lead Model: 5076
Lead Channel Impedance Value: 361 Ohm
Lead Channel Pacing Threshold Pulse Width: 0.4 ms
Lead Channel Pacing Threshold Pulse Width: 0.4 ms
Lead Channel Sensing Intrinsic Amplitude: 1.3 mV
Lead Channel Setting Pacing Amplitude: 2 V
Lead Channel Setting Sensing Sensitivity: 0.3 mV
MDC IDC LEAD IMPLANT DT: 20150417
MDC IDC LEAD LOCATION: 753858
MDC IDC LEAD MODEL: 6935
MDC IDC MSMT BATTERY REMAINING LONGEVITY: 67 mo
MDC IDC MSMT LEADCHNL LV IMPEDANCE VALUE: 399 Ohm
MDC IDC MSMT LEADCHNL LV PACING THRESHOLD AMPLITUDE: 0.75 V
MDC IDC MSMT LEADCHNL RA IMPEDANCE VALUE: 399 Ohm
MDC IDC MSMT LEADCHNL RA PACING THRESHOLD AMPLITUDE: 0.5 V
MDC IDC MSMT LEADCHNL RV PACING THRESHOLD AMPLITUDE: 0.75 V
MDC IDC MSMT LEADCHNL RV PACING THRESHOLD PULSEWIDTH: 0.4 ms
MDC IDC MSMT LEADCHNL RV SENSING INTR AMPL: 8.9 mV
MDC IDC SET LEADCHNL LV PACING AMPLITUDE: 2 V
MDC IDC SET LEADCHNL LV PACING PULSEWIDTH: 0.4 ms
MDC IDC SET LEADCHNL RV PACING AMPLITUDE: 2.5 V
MDC IDC SET LEADCHNL RV PACING PULSEWIDTH: 0.4 ms
MDC IDC STAT BRADY AP VS PERCENT: 0.1 % — AB
MDC IDC STAT BRADY AS VS PERCENT: 3.1 %

## 2015-11-17 MED ORDER — FUROSEMIDE 40 MG PO TABS: ORAL_TABLET | ORAL | Status: DC

## 2015-11-17 NOTE — Progress Notes (Signed)
HPI [name] returns today for followup. She is a pleasant 75 yo woman who has LBBB, chronic systolic heart failure and newly diagnosed atrial fib who was started on systemic anti-coagulation several months ago.  Her ICD insertion site is sore but is improving slowly and she has had no drainage or erythema. Her dyspnea is improved but still not as good as she would like. She denies palpitations. She has been bothered by arthritis. She has had some tenderness in her left arm and shoulder. Non-exertional. Allergies  Allergen Reactions  . Ciprofloxacin Cough  . Dexlansoprazole     Lower abdominal pain.  . Statins Other (See Comments)    Arthralgias during treatment with at least 2 members of his class      Current Outpatient Prescriptions  Medication Sig Dispense Refill  . benazepril (LOTENSIN) 40 MG tablet TAKE ONE TABLET BY MOUTH EVERY MORNING 90 tablet 3  . carvedilol (COREG) 25 MG tablet TAKE ONE-HALF TABLET BY MOUTH IN THE MORNING AND ONE TABLET BY MOUTH IN THE EVENING/AT BEDTIME 60 tablet 6  . cholecalciferol (VITAMIN D) 1000 UNITS tablet Take 1,000 Units by mouth daily.    . fexofenadine (ALLEGRA) 180 MG tablet Take 180 mg by mouth every morning.     . fluticasone (FLONASE) 50 MCG/ACT nasal spray Place 2 sprays into the nose daily as needed for allergies.     . furosemide (LASIX) 40 MG tablet TAKE ONE TABLET BY MOUTH ONCE DAILY 30 tablet 6  . lansoprazole (PREVACID) 15 MG capsule Take 15 mg by mouth every morning.     Marland Kitchen levothyroxine (SYNTHROID) 100 MCG tablet Take 100 mcg by mouth every morning.     . Multiple Vitamins-Minerals (CENTRUM SILVER PO) Take 1 tablet by mouth daily.     Marland Kitchen spironolactone (ALDACTONE) 25 MG tablet Take 0.5 tablets (12.5 mg total) by mouth every other day. 45 tablet 3  . traMADol (ULTRAM) 50 MG tablet Take 50 mg by mouth every 6 (six) hours as needed for moderate pain.     Marland Kitchen XARELTO 20 MG TABS tablet TAKE ONE TABLET BY MOUTH DAILY WITH  SUPPER 30  tablet 6   No current facility-administered medications for this visit.     Past Medical History  Diagnosis Date  . Hyperlipemia   . Hypertension   . Degenerative joint disease     Right hip and knee  . Hypothyroid   . 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 GERD (gastroesophageal reflux disease)   . S/P colonoscopy 2009    Dr. Sharlett Iles: reportedly normal per pt  . S/P endoscopy 1980s    per pt; normal. performed secondary to reflux  . Cerebrovascular accident [facility])     No clinical event; asymptomatic CT finding  . LBBB (left bundle branch block)     ROS:   All systems reviewed and negative except as noted in the HPI.   Past Surgical History  Procedure Laterality Date  . Thyroidectomy      neoplasm  . Cholecystectomy    . Appendectomy    . Abdominal hysterectomy    . Breast biopsy      bilaterally; benign  . Mandible surgery      secondary to malocclusion  . Ureteral surgery  1970  . Colonoscopy  2009  . Esophagogastroduodenoscopy  07/13/2011    small hh, patchy gastric  erythema with scarring deformity of antrum, chronic gastritis, bx benign with no H.Pylori. Procedure: ESOPHAGOGASTRODUODENOSCOPY (EGD);  Surgeon: Daneil Dolin, MD;  [facility];  Service: Endoscopy;  Laterality: N/A;  10:45  . Can not have mri due to jaw surgery    . Bi-ventricular implantable cardioverter defibrillator  (crt-d)  10-31-2013    MDT Hillery Aldo CRTD implanted by Dr Lovena Le  . Implantable cardioverter defibrillator implant N/A 10/31/2013    Procedure: IMPLANTABLE CARDIOVERTER DEFIBRILLATOR IMPLANT;  Surgeon: Evans Lance, MD;  Location: [facility] CATH LAB;  Service: Cardiovascular;  Laterality: N/A;     Family History  Problem Relation Age of Onset  . Brain cancer Mother   . Atrial fibrillation Other   . Colon cancer Neg Hx      Social History   Social History    . Marital Status: Widowed    Spouse Name: N/A  . Number of Children: 2  . Years of Education: N/A   Occupational History  . Not on file.   Social History Main Topics  . Smoking status: Never Smoker   . Smokeless tobacco: Never Used  . Alcohol Use: No  . Drug Use: No  . Sexual Activity: Not on file   Other Topics Concern  . Not on file   Social History Narrative   Widowed           BP 128/78 mmHg  Pulse 78  Ht 5\' 7"  (1.702 m)  Wt 234 lb (106.142 kg)  BMI 36.64 kg/m2  SpO2 91%  Physical Exam:  Well appearing 75 yo woman, NAD HEENT: Unremarkable Neck:  No JVD, no thyromegally Back:  No CVA tenderness Lungs:  Clear with no wheezes. She has no erythema around her incision but she does have tenderness at the bottom of the can. HEART:  Regular rate rhythm, no murmurs, no rubs, no clicks Abd:  soft, positive bowel sounds, no organomegally, no rebound, no guarding Ext:  2 plus pulses, 1-2+ peripheral edema, no cyanosis, no clubbing Skin:  No rashes no nodules Neuro:  CN II through XII intact, motor grossly intact   DEVICE  Normal device function.  See PaceArt for details.   Assess/Plan: 1. Chronic systolic heart failure - her symptoms are class 2. She will followup with Dr. Raliegh Ip in 3 months. I have asked her to increase her lasix for 2 days.  2. ICD - her medtronic BiV ICD is working normally. Her optivol is up a bit. 3. HTN - her blood pressure is well controlled. No change in meds. 4. Obesity - she is encouraged to lose weight.  Mikle Bosworth.D.

## 2015-11-17 NOTE — Patient Instructions (Addendum)
Your physician wants you to follow-up in: 1 Year with Dr. Lovena Le. You will receive a reminder letter in the mail two months in advance. If you don't receive a letter, please call our office to schedule the follow-up appointment.  Your physician recommends that you continue on your current medications as directed. Please refer to the Current Medication list given to you today.  Remote monitoring is used to monitor your Pacemaker of ICD from home. This monitoring reduces the number of office visits required to check your device to one time per year. It allows [place] to keep an eye on the functioning of your device to ensure it is working properly. You are scheduled for a device check from home on 02/16/16. You may send your transmission at any time that day. If you have a wireless device, the transmission will be sent automatically. After your physician reviews your transmission, you will receive a postcard with your next transmission date.  You have been given samples of Xarelto today.   If you need a refill on your cardiac medications before your next appointment, please call your pharmacy.  Thank you for choosing [facility]!

## 2016-02-07 ENCOUNTER — Other Ambulatory Visit: Payer: Self-pay | Admitting: Cardiovascular Disease

## 2016-02-10 ENCOUNTER — Ambulatory Visit (INDEPENDENT_AMBULATORY_CARE_PROVIDER_SITE_OTHER): Payer: Medicare Other | Admitting: Nurse Practitioner

## 2016-02-10 ENCOUNTER — Encounter: Payer: Self-pay | Admitting: Nurse Practitioner

## 2016-02-10 VITALS — BP 125/65 | HR 73 | Temp 97.0°F | Ht 67.0 in | Wt 230.8 lb

## 2016-02-10 DIAGNOSIS — K219 Gastro-esophageal reflux disease without esophagitis: Secondary | ICD-10-CM

## 2016-02-10 DIAGNOSIS — K5900 Constipation, unspecified: Secondary | ICD-10-CM

## 2016-02-10 MED ORDER — LINACLOTIDE 72 MCG PO CAPS: 72.0000 ug | ORAL_CAPSULE | Freq: Every day | ORAL | 3 refills | Status: DC

## 2016-02-10 NOTE — Assessment & Plan Note (Signed)
Remains intermittently constipated. Linzess worked very well for her but she cannot afford it approximately 5-6 years ago. We will trial Linzess 75 mg daily or every other day depending on her response. I'll provide samples for 2 weeks and send a prescription in to see if it is any cheaper at this point with her insurance. Return for follow-up in 3 months.

## 2016-02-10 NOTE — Patient Instructions (Signed)
1. Take Prevacid twice a day for now 2. Try Linzess 72 mcg once a day. You can back off to every other day if needed. 3. Return for follow-up in 3 months 4. Call if any severe or worsening symptoms before the.

## 2016-02-10 NOTE — Progress Notes (Signed)
CC'ED TO PCP 

## 2016-02-10 NOTE — Assessment & Plan Note (Signed)
Is having flare up of GERD. Currently on Versed 15 mg daily. She is on Xarelto for cardiac history. We'll increase her visit to 50 mg twice a day for the next 3 months to see if this promote symptomatic improvement. Return for follow-up in 3 months which point we can consider whether to back off, continue with a higher dose, consider need for endoscopy.

## 2016-02-10 NOTE — Progress Notes (Signed)
Primary Care Physician:  Alonza Bogus, MD Primary Gastroenterologist:  Dr. Gala Romney  Chief Complaint  Patient presents with  . Gastroesophageal Reflux    nausea    HPI:   [name] is a 75 y.o. female who presents for abdominal pain. She was last seen in our office in 2013 for constipation. Of note she has a history of constipation, GERD, dyspepsia. At last visit [place] worked well for her constipation however she could not afford it. She is recommended to take half a capful of MiraLAX daily as one capful every other day was not sufficient to control her symptoms and one capful daily cause diarrhea.  Last EGD 07/13/2011 for epigastric pain which found small hiatal hernia, patchy gastric erythema with scarring and deformity of the antrum suggestive of prior peptic ulcer disease status post biopsy. Recommend continue Dexon daily. Surgical pathology found chronic gastritis without evidence of H. pylori. 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  Today she states she is having some constipation "but not all the time." Occasionally takes OTC laxatives. Linzess worked well previously but was too expensive and is wondering if it's any cheaper now. However, her main concern is intermittent flare-ups of gas/belching. Denies epigastric pain, esophageal burning. Does have worsening gas/belching and significant phlegm after eating. Also with significant nausea and vomiting. Happens about 1-2 times a month. Is on Prevacid currently. Dexilant caused abdominal pain. Denies hematochezia, melena, fever, chills, unintentional weight loss. Denies chest pain, dyspnea, dizziness, lightheadedness, syncope, near syncope. Denies any other upper or lower GI symptoms.  Past Medical History:  Diagnosis Date  . 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 Cerebrovascular accident [facility]    No clinical  event; asymptomatic CT finding  . Degenerative joint disease    Right hip and knee  . GERD (gastroesophageal reflux disease)   . Hyperlipemia   . Hypertension   . Hypothyroid   . LBBB (left bundle branch block)   . S/P colonoscopy 2009   Dr. Sharlett Iles: reportedly normal per pt  . S/P endoscopy 1980s   per pt; normal. performed secondary to reflux    Past Surgical History:  Procedure Laterality Date  . ABDOMINAL HYSTERECTOMY    . APPENDECTOMY    . BI-VENTRICULAR IMPLANTABLE CARDIOVERTER DEFIBRILLATOR  (CRT-D)  10-31-2013   MDT Hillery Aldo CRTD implanted by Dr Lovena Le  . BREAST BIOPSY     bilaterally; benign  . can not have MRI due to jaw surgery    . CHOLECYSTECTOMY    . COLONOSCOPY  2009  . ESOPHAGOGASTRODUODENOSCOPY  07/13/2011   small hh, patchy gastric erythema with scarring deformity of antrum, chronic gastritis, bx benign with no H.Pylori. Procedure: ESOPHAGOGASTRODUODENOSCOPY (EGD);  Surgeon: Daneil Dolin, MD;  [facility];  Service: Endoscopy;  Laterality: N/A;  10:45  . IMPLANTABLE CARDIOVERTER DEFIBRILLATOR IMPLANT N/A 10/31/2013   Procedure: IMPLANTABLE CARDIOVERTER DEFIBRILLATOR IMPLANT;  Surgeon: Evans Lance, MD;  Location: [facility] CATH LAB;  Service: Cardiovascular;  Laterality: N/A;  . MANDIBLE SURGERY     secondary to malocclusion  . THYROIDECTOMY     neoplasm  . Ureteral Surgery  1970    Current Outpatient Prescriptions  Medication Sig Dispense Refill  . benazepril (LOTENSIN) 40 MG tablet TAKE ONE TABLET BY MOUTH EVERY MORNING 90 tablet 3  . carvedilol (COREG) 25 MG tablet TAKE ONE-HALF TABLET BY MOUTH IN THE  MORNING AND ONE TABLET BY MOUTH IN THE EVENING/AT BEDTIME 60 tablet 3  . cholecalciferol (VITAMIN D) 1000 UNITS tablet Take 1,000 Units by mouth daily.    . fexofenadine (ALLEGRA) 180 MG tablet Take 180 mg by mouth every morning.     . fluticasone (FLONASE) 50 MCG/ACT nasal spray Place 2 sprays into the nose daily as needed for allergies.     .  furosemide (LASIX) 40 MG tablet Take As Directed (Patient taking differently: 40 mg 2 (two) times daily. Take As Directed) 45 tablet 11  . lansoprazole (PREVACID) 15 MG capsule Take 15 mg by mouth every morning.     Marland Kitchen levothyroxine (SYNTHROID, LEVOTHROID) 112 MCG tablet Take 112 mcg by mouth daily before breakfast.    . Multiple Vitamins-Minerals (CENTRUM SILVER PO) Take 1 tablet by mouth daily.     Marland Kitchen spironolactone (ALDACTONE) 25 MG tablet Take 0.5 tablets (12.5 mg total) by mouth every other day. 45 tablet 3  . traMADol (ULTRAM) 50 MG tablet Take 50 mg by mouth every 6 (six) hours as needed for moderate pain.     Marland Kitchen XARELTO 20 MG TABS tablet TAKE ONE TABLET BY MOUTH DAILY WITH  SUPPER 30 tablet 6   No current facility-administered medications for this visit.     Allergies as of 02/10/2016 - Review Complete 02/10/2016  Allergen Reaction Noted  . Ciprofloxacin Cough 10/17/2013  . Dexlansoprazole  09/13/2011  . Statins Other (See Comments) 05/30/2012    Family History  Problem Relation Age of Onset  . Brain cancer Mother   . Atrial fibrillation Other   . Colon cancer Neg Hx     Social History   Social History  . Marital status: Widowed    Spouse name: N/A  . Number of children: 2  . Years of education: N/A   Occupational History  . Not on file.   Social History Main Topics  . Smoking status: Never Smoker  . Smokeless tobacco: Never Used  . Alcohol use No  . Drug use: No  . Sexual activity: Not on file   Other Topics Concern  . Not on file   Social History Narrative   Widowed          Review of Systems: General: Negative for anorexia, weight loss, fever, chills, fatigue, weakness. ENT: Negative for hoarseness, difficulty swallowing. CV: Negative for chest pain, angina, palpitations, peripheral edema.  Respiratory: Negative for dyspnea at rest, cough, sputum, wheezing.  GI: See history of present illness. MS: Admits chronic joint pain.  Derm: Negative for rash  or itching.  Endo: Negative for unusual weight change.  Heme: Negative for bruising or bleeding. Allergy: Negative for rash or hives.    Physical Exam: BP 125/65   Pulse 73   Temp 97 F (36.1 C) (Oral)   Ht 5\' 7"  (1.702 m)   Wt 230 lb 12.8 oz (104.7 kg)   BMI 36.15 kg/m  General:   Alert and oriented. Pleasant and cooperative. Well-nourished and well-developed.  Head:  Normocephalic and atraumatic. Eyes:  Without icterus, sclera clear and conjunctiva pink.  Ears:  Normal auditory acuity. Cardiovascular:  S1, S2 present without murmurs appreciated. Extremities without clubbing or edema. Respiratory:  Clear to auscultation bilaterally. No wheezes, rales, or rhonchi. No distress.  Gastrointestinal:  +BS, soft, non-tender and non-distended. No HSM noted. No guarding or rebound. No masses appreciated.  Rectal:  Deferred  Musculoskalatal:  Symmetrical without gross deformities. Neurologic:  Alert and oriented x4;  grossly normal  neurologically. Psych:  Alert and cooperative. Normal mood and affect. Heme/Lymph/Immune: No excessive bruising noted.    02/10/2016 10:42 AM   Disclaimer: This note was dictated with voice recognition software. Similar sounding words can inadvertently be transcribed and may not be corrected upon review.

## 2016-02-16 ENCOUNTER — Telehealth: Payer: Self-pay | Admitting: Cardiology

## 2016-02-16 ENCOUNTER — Ambulatory Visit (INDEPENDENT_AMBULATORY_CARE_PROVIDER_SITE_OTHER): Payer: Medicare Other | Admitting: *Deleted

## 2016-02-16 DIAGNOSIS — I5022 Chronic systolic (congestive) heart failure: Secondary | ICD-10-CM

## 2016-02-16 DIAGNOSIS — Z95810 Presence of automatic (implantable) cardiac defibrillator: Secondary | ICD-10-CM

## 2016-02-16 DIAGNOSIS — I429 Cardiomyopathy, unspecified: Secondary | ICD-10-CM

## 2016-02-16 NOTE — Telephone Encounter (Signed)
Spoke with pt and reminded pt of remote transmission that is due today. Pt verbalized understanding.   

## 2016-02-16 NOTE — Progress Notes (Signed)
Remote ICD transmission.   

## 2016-02-23 ENCOUNTER — Encounter: Payer: Self-pay | Admitting: Cardiology

## 2016-02-23 NOTE — Progress Notes (Signed)
letters

## 2016-02-24 ENCOUNTER — Ambulatory Visit (INDEPENDENT_AMBULATORY_CARE_PROVIDER_SITE_OTHER): Payer: Medicare Other | Admitting: Otolaryngology

## 2016-02-24 DIAGNOSIS — R42 Dizziness and giddiness: Secondary | ICD-10-CM | POA: Diagnosis not present

## 2016-02-24 DIAGNOSIS — H903 Sensorineural hearing loss, bilateral: Secondary | ICD-10-CM | POA: Diagnosis not present

## 2016-02-24 DIAGNOSIS — H6981 Other specified disorders of Eustachian tube, right ear: Secondary | ICD-10-CM

## 2016-03-02 LAB — CUP PACEART REMOTE DEVICE CHECK
Brady Statistic AP VS Percent: 0.02 %
Brady Statistic AS VP Percent: 91.91 %
Brady Statistic RA Percent Paced: 5.49 %
Brady Statistic RV Percent Paced: 92.91 %
Date Time Interrogation Session: 20170802161901
HIGH POWER IMPEDANCE MEASURED VALUE: 77 Ohm
Implantable Lead Implant Date: 20150417
Implantable Lead Location: 753859
Lead Channel Impedance Value: 361 Ohm
Lead Channel Impedance Value: 361 Ohm
Lead Channel Impedance Value: 418 Ohm
Lead Channel Impedance Value: 475 Ohm
Lead Channel Impedance Value: 703 Ohm
Lead Channel Pacing Threshold Amplitude: 0.875 V
Lead Channel Pacing Threshold Pulse Width: 0.4 ms
Lead Channel Sensing Intrinsic Amplitude: 10.125 mV
Lead Channel Sensing Intrinsic Amplitude: 10.125 mV
Lead Channel Setting Pacing Amplitude: 1.75 V
Lead Channel Setting Pacing Pulse Width: 0.4 ms
MDC IDC LEAD IMPLANT DT: 20150417
MDC IDC LEAD IMPLANT DT: 20150417
MDC IDC LEAD LOCATION: 753858
MDC IDC LEAD LOCATION: 753860
MDC IDC LEAD MODEL: 4298
MDC IDC LEAD MODEL: 6935
MDC IDC MSMT BATTERY REMAINING LONGEVITY: 69 mo
MDC IDC MSMT BATTERY VOLTAGE: 2.98 V
MDC IDC MSMT LEADCHNL LV IMPEDANCE VALUE: 361 Ohm
MDC IDC MSMT LEADCHNL LV IMPEDANCE VALUE: 418 Ohm
MDC IDC MSMT LEADCHNL LV IMPEDANCE VALUE: 589 Ohm
MDC IDC MSMT LEADCHNL LV IMPEDANCE VALUE: 589 Ohm
MDC IDC MSMT LEADCHNL LV IMPEDANCE VALUE: 665 Ohm
MDC IDC MSMT LEADCHNL LV IMPEDANCE VALUE: 703 Ohm
MDC IDC MSMT LEADCHNL LV PACING THRESHOLD AMPLITUDE: 0.75 V
MDC IDC MSMT LEADCHNL LV PACING THRESHOLD PULSEWIDTH: 0.4 ms
MDC IDC MSMT LEADCHNL RA PACING THRESHOLD AMPLITUDE: 0.5 V
MDC IDC MSMT LEADCHNL RA PACING THRESHOLD PULSEWIDTH: 0.4 ms
MDC IDC MSMT LEADCHNL RA SENSING INTR AMPL: 1.75 mV
MDC IDC MSMT LEADCHNL RA SENSING INTR AMPL: 1.75 mV
MDC IDC MSMT LEADCHNL RV IMPEDANCE VALUE: 304 Ohm
MDC IDC MSMT LEADCHNL RV IMPEDANCE VALUE: 399 Ohm
MDC IDC SET LEADCHNL RA PACING AMPLITUDE: 2 V
MDC IDC SET LEADCHNL RV PACING AMPLITUDE: 2.5 V
MDC IDC SET LEADCHNL RV PACING PULSEWIDTH: 0.4 ms
MDC IDC SET LEADCHNL RV SENSING SENSITIVITY: 0.3 mV
MDC IDC STAT BRADY AP VP PERCENT: 5.48 %
MDC IDC STAT BRADY AS VS PERCENT: 2.59 %

## 2016-03-06 ENCOUNTER — Other Ambulatory Visit: Payer: Self-pay

## 2016-03-06 NOTE — Telephone Encounter (Signed)
[facility] has 40 mg lasix for pt's refill

## 2016-03-17 ENCOUNTER — Other Ambulatory Visit: Payer: Self-pay

## 2016-04-05 ENCOUNTER — Other Ambulatory Visit: Payer: Self-pay | Admitting: Cardiovascular Disease

## 2016-04-05 ENCOUNTER — Ambulatory Visit (INDEPENDENT_AMBULATORY_CARE_PROVIDER_SITE_OTHER): Payer: Medicare Other | Admitting: Cardiovascular Disease

## 2016-04-05 ENCOUNTER — Encounter: Payer: Self-pay | Admitting: Cardiovascular Disease

## 2016-04-05 VITALS — BP 142/78 | HR 77 | Ht 67.0 in | Wt 230.0 lb

## 2016-04-05 DIAGNOSIS — I5023 Acute on chronic systolic (congestive) heart failure: Secondary | ICD-10-CM | POA: Diagnosis not present

## 2016-04-05 DIAGNOSIS — I429 Cardiomyopathy, unspecified: Secondary | ICD-10-CM

## 2016-04-05 DIAGNOSIS — Z95810 Presence of automatic (implantable) cardiac defibrillator: Secondary | ICD-10-CM | POA: Diagnosis not present

## 2016-04-05 DIAGNOSIS — I480 Paroxysmal atrial fibrillation: Secondary | ICD-10-CM | POA: Diagnosis not present

## 2016-04-05 DIAGNOSIS — I10 Essential (primary) hypertension: Secondary | ICD-10-CM

## 2016-04-05 MED ORDER — FUROSEMIDE 40 MG PO TABS: 40.0000 mg | ORAL_TABLET | Freq: Two times a day (BID) | ORAL | 3 refills | Status: DC

## 2016-04-05 NOTE — Patient Instructions (Signed)
Medication Instructions:  INCREASE LASIX TO 40 MG TWO TIMES DAILY   Labs: Your physician recommends that you return for lab work in: 1 week  bmet   Testing/Procedures: none  Follow-Up: Your physician recommends that you schedule a follow-up appointment in: 6 months with Dr. Lovena Le , [place].    Any Other Special Instructions Will Be Listed Below (If Applicable).     If you need a refill on your cardiac medications before your next appointment, please call your pharmacy.

## 2016-04-05 NOTE — Progress Notes (Signed)
SUBJECTIVE: The patient is here for routine cardiovascular followup. She has a history of a nonischemic cardiomyopathy s/p BiV ICD, hypertension, atrial fibrillation and hyperlipidemia. An echocardiogram in July 2014 revealed an ejection fraction of 30%.  Has NYHA class II exertional dyspnea. Has some musculoskeletal right sided neck pain. Also c/o ankle swelling bilaterally.   Review of Systems: As per "subjective", otherwise negative.  Allergies  Allergen Reactions  . Ciprofloxacin Cough  . Dexlansoprazole     Lower abdominal pain.  . Statins Other (See Comments)    Arthralgias during treatment with at least 2 members of his class     Current Outpatient Prescriptions  Medication Sig Dispense Refill  . benazepril (LOTENSIN) 40 MG tablet TAKE ONE TABLET BY MOUTH EVERY MORNING 90 tablet 3  . carvedilol (COREG) 25 MG tablet TAKE ONE-HALF TABLET BY MOUTH IN THE MORNING AND ONE TABLET BY MOUTH IN THE EVENING/AT BEDTIME 60 tablet 3  . cholecalciferol (VITAMIN D) 1000 UNITS tablet Take 1,000 Units by mouth daily.    . fexofenadine (ALLEGRA) 180 MG tablet Take 180 mg by mouth every morning.     . fluticasone (FLONASE) 50 MCG/ACT nasal spray Place 2 sprays into the nose daily as needed for allergies.     . furosemide (LASIX) 40 MG tablet Take As Directed (Patient taking differently: 40 mg 2 (two) times daily. Take As Directed) 45 tablet 11  . lansoprazole (PREVACID) 15 MG capsule Take 15 mg by mouth 2 (two) times daily before a meal.    . levothyroxine (SYNTHROID, LEVOTHROID) 112 MCG tablet Take 112 mcg by mouth daily before breakfast.    . linaclotide (LINZESS) 72 MCG capsule Take 1 capsule (72 mcg total) by mouth daily before breakfast. 30 capsule 3  . Multiple Vitamins-Minerals (CENTRUM SILVER PO) Take 1 tablet by mouth daily.     Marland Kitchen spironolactone (ALDACTONE) 25 MG tablet Take 0.5 tablets (12.5 mg total) by mouth every other day. 45 tablet 3  . traMADol (ULTRAM) 50 MG tablet Take  50 mg by mouth every 6 (six) hours as needed for moderate pain.     Marland Kitchen XARELTO 20 MG TABS tablet TAKE ONE TABLET BY MOUTH DAILY WITH  SUPPER 30 tablet 6   No current facility-administered medications for this visit.     Past Medical History:  Diagnosis Date  . 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 Cerebrovascular accident [facility])    No clinical event; asymptomatic CT finding  . Degenerative joint disease    Right hip and knee  . GERD (gastroesophageal reflux disease)   . Hyperlipemia   . Hypertension   . Hypothyroid   . LBBB (left bundle branch block)   . S/P colonoscopy 2009   Dr. Sharlett Iles: reportedly normal per pt  . S/P endoscopy 1980s   per pt; normal. performed secondary to reflux    Past Surgical History:  Procedure Laterality Date  . ABDOMINAL HYSTERECTOMY    . APPENDECTOMY    . BI-VENTRICULAR IMPLANTABLE CARDIOVERTER DEFIBRILLATOR  (CRT-D)  10-31-2013   MDT Hillery Aldo CRTD implanted by Dr Lovena Le  . BREAST BIOPSY     bilaterally; benign  . can not have MRI due to jaw surgery    . CHOLECYSTECTOMY    . COLONOSCOPY  2009  . ESOPHAGOGASTRODUODENOSCOPY  07/13/2011   small hh, patchy gastric erythema with scarring deformity of antrum, chronic  gastritis, bx benign with no H.Pylori. Procedure: ESOPHAGOGASTRODUODENOSCOPY (EGD);  Surgeon: Daneil Dolin, MD;  [facility];  Service: Endoscopy;  Laterality: N/A;  10:45  . IMPLANTABLE CARDIOVERTER DEFIBRILLATOR IMPLANT N/A 10/31/2013   Procedure: IMPLANTABLE CARDIOVERTER DEFIBRILLATOR IMPLANT;  Surgeon: Evans Lance, MD;  Location: [facility] CATH LAB;  Service: Cardiovascular;  Laterality: N/A;  . MANDIBLE SURGERY     secondary to malocclusion  . THYROIDECTOMY     neoplasm  . Ureteral Surgery  1970    Social History   Social History  . Marital status: Widowed    Spouse name: N/A  . Number of children: 2  .  Years of education: N/A   Occupational History  . Not on file.   Social History Main Topics  . Smoking status: Never Smoker  . Smokeless tobacco: Never Used  . Alcohol use No  . Drug use: No  . Sexual activity: No   Other Topics Concern  . Not on file   Social History Narrative   Widowed           Vitals:   04/05/16 0811  BP: (!) 142/78  Pulse: 77  SpO2: 93%  Weight: 230 lb (104.3 kg)  Height: 5\' 7"  (1.702 m)    PHYSICAL EXAM General: NAD HEENT: Normal. Neck: No JVD, no thyromegaly. Lungs: Clear to auscultation bilaterally with normal respiratory effort. CV: Nondisplaced PMI.  Regular rate and rhythm, normal S1/S2, no S3/S4, no murmur. Trace pretibial and periankle edema b/l Abdomen: Soft, nontender, obese.  Neurologic: Alert and oriented.  Psych: Normal affect. Skin: Normal. Musculoskeletal: No gross deformities.    ECG: Most recent ECG reviewed.      ASSESSMENT AND PLAN: 1. Nonischemic cardiomyopathy/acute on chronic systolic heart failure with BiV ICD: Wt up 5 lbs since last visit with some pedal edema. Will increase Lasix to 40 mg BID and check BMET in 1 week. Continue current doses of benazepril, carvedilol, and spironolactone.   2. Essential HTN: Mildly elevated. Monitor with diuresis.  3. Atrial fibrillation: Remains on Xarelto.   4. BiV ICD in situ:  Follows with Dr. Lovena Le.  Dispo: f/u 1 year with me. 6 months with Dr. Lovena Le.   Kate Sable, M.D., F.A.C.C.

## 2016-04-10 DIAGNOSIS — M542 Cervicalgia: Secondary | ICD-10-CM | POA: Diagnosis not present

## 2016-04-10 DIAGNOSIS — I429 Cardiomyopathy, unspecified: Secondary | ICD-10-CM | POA: Diagnosis not present

## 2016-04-10 DIAGNOSIS — Z23 Encounter for immunization: Secondary | ICD-10-CM | POA: Diagnosis not present

## 2016-04-10 DIAGNOSIS — I482 Chronic atrial fibrillation: Secondary | ICD-10-CM | POA: Diagnosis not present

## 2016-04-10 DIAGNOSIS — M19012 Primary osteoarthritis, left shoulder: Secondary | ICD-10-CM | POA: Diagnosis not present

## 2016-04-18 DIAGNOSIS — I5022 Chronic systolic (congestive) heart failure: Secondary | ICD-10-CM | POA: Diagnosis not present

## 2016-04-19 LAB — BASIC METABOLIC PANEL
BUN: 14 mg/dL (ref 7–25)
CO2: 30 mmol/L (ref 20–31)
CREATININE: 1.14 mg/dL — AB (ref 0.60–0.93)
Calcium: 9.3 mg/dL (ref 8.6–10.4)
Chloride: 98 mmol/L (ref 98–110)
GLUCOSE: 80 mg/dL (ref 65–99)
Potassium: 4.2 mmol/L (ref 3.5–5.3)
Sodium: 137 mmol/L (ref 135–146)

## 2016-05-09 ENCOUNTER — Encounter: Payer: Self-pay | Admitting: Nurse Practitioner

## 2016-05-09 ENCOUNTER — Ambulatory Visit (INDEPENDENT_AMBULATORY_CARE_PROVIDER_SITE_OTHER): Payer: Medicare Other | Admitting: Nurse Practitioner

## 2016-05-09 VITALS — BP 143/82 | HR 67 | Temp 98.0°F | Ht 67.0 in | Wt 230.4 lb

## 2016-05-09 DIAGNOSIS — K219 Gastro-esophageal reflux disease without esophagitis: Secondary | ICD-10-CM | POA: Diagnosis not present

## 2016-05-09 DIAGNOSIS — K5900 Constipation, unspecified: Secondary | ICD-10-CM | POA: Diagnosis not present

## 2016-05-09 NOTE — Assessment & Plan Note (Signed)
Currently well-controlled on Linzess 72 g every other day. Continue current dosage, return for follow-up in 6 months.

## 2016-05-09 NOTE — Progress Notes (Signed)
Referring Provider: Sinda Du, MD Primary Care Physician:  Alonza Bogus, MD Primary GI:  Dr. Gala Romney  Chief Complaint  Patient presents with  . Follow-up    doing ok    HPI:   [name] is a 75 y.o. female who presents For follow-up on GERD and constipation. The patient was last seen in our office 02/10/2016 for the same. At that time she noted some constipation, but not all the time. Occasional over-the-counter laxatives. Linzess worked well but was too expensive. Noted nausea, vomiting, worsening gas/belching. Was on Prevacid at the time and Dexon caused abdominal pain. No other GI symptoms. She was started on Linzess 72 g every other day. Recommended to take Prevacid twice a day for 3 months at which point we would follow-up and determine whether to back off to dose or proceed with endoscopy if it is not effective.  Today she states she's doing well. Constipation is very well controlled on 72 mcg every other day. Her GERD symptoms are resolved on Prevacid every other day. Denies abdominal pain, N/V, hematochezia, melena. Denies chest pain, dyspnea, dizziness, lightheadedness, syncope, near syncope. Denies any other upper or lower GI symptoms.  Past Medical History:  Diagnosis Date  . 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 Cerebrovascular accident [facility])    No clinical event; asymptomatic CT finding  . Degenerative joint disease    Right hip and knee  . GERD (gastroesophageal reflux disease)   . Hip dislocation, right ([place])   . Hyperlipemia   . Hypertension   . Hypothyroid   . LBBB (left bundle branch block)   . S/P colonoscopy 2009   Dr. Sharlett Iles: reportedly normal per pt  . S/P endoscopy 1980s   per pt; normal. performed secondary to reflux    Past Surgical History:  Procedure Laterality Date  . ABDOMINAL HYSTERECTOMY    . APPENDECTOMY    .  BI-VENTRICULAR IMPLANTABLE CARDIOVERTER DEFIBRILLATOR  (CRT-D)  10-31-2013   MDT Hillery Aldo CRTD implanted by Dr Lovena Le  . BREAST BIOPSY     bilaterally; benign  . can not have MRI due to jaw surgery    . CHOLECYSTECTOMY    . COLONOSCOPY  2009  . ESOPHAGOGASTRODUODENOSCOPY  07/13/2011   small hh, patchy gastric erythema with scarring deformity of antrum, chronic gastritis, bx benign with no H.Pylori. Procedure: ESOPHAGOGASTRODUODENOSCOPY (EGD);  Surgeon: Daneil Dolin, MD;  [facility];  Service: Endoscopy;  Laterality: N/A;  10:45  . IMPLANTABLE CARDIOVERTER DEFIBRILLATOR IMPLANT N/A 10/31/2013   Procedure: IMPLANTABLE CARDIOVERTER DEFIBRILLATOR IMPLANT;  Surgeon: Evans Lance, MD;  Location: [facility] CATH LAB;  Service: Cardiovascular;  Laterality: N/A;  . MANDIBLE SURGERY     secondary to malocclusion  . THYROIDECTOMY     neoplasm  . Ureteral Surgery  1970    Current Outpatient Prescriptions  Medication Sig Dispense Refill  . benazepril (LOTENSIN) 40 MG tablet TAKE ONE TABLET BY MOUTH EVERY MORNING 90 tablet 3  . carvedilol (COREG) 25 MG tablet TAKE ONE-HALF TABLET BY MOUTH IN THE MORNING AND ONE TABLET BY MOUTH IN THE EVENING/AT BEDTIME 60 tablet 3  . cholecalciferol (VITAMIN D) 1000 UNITS tablet Take 1,000 Units by mouth daily.    . fexofenadine (ALLEGRA) 180 MG tablet Take 180 mg by mouth every morning.     . fluticasone (FLONASE) 50 MCG/ACT nasal spray Place 2 sprays into  the nose daily as needed for allergies.     . furosemide (LASIX) 40 MG tablet Take 1 tablet (40 mg total) by mouth 2 (two) times daily. Take As Directed 180 tablet 3  . lansoprazole (PREVACID) 15 MG capsule Take 15 mg by mouth 2 (two) times daily before a meal.    . levothyroxine (SYNTHROID, LEVOTHROID) 112 MCG tablet Take 112 mcg by mouth daily before breakfast.    . linaclotide (LINZESS) 72 MCG capsule Take 1 capsule (72 mcg total) by mouth daily before breakfast. (Patient taking differently: Take 72 mcg  by mouth as needed. ) 30 capsule 3  . Multiple Vitamins-Minerals (CENTRUM SILVER PO) Take 1 tablet by mouth daily.     Marland Kitchen spironolactone (ALDACTONE) 25 MG tablet TAKE ONE-HALF TABLET BY MOUTH EVERY OTHER DAY 22 tablet 6  . traMADol (ULTRAM) 50 MG tablet Take 50 mg by mouth every 6 (six) hours as needed for moderate pain.     Marland Kitchen XARELTO 20 MG TABS tablet TAKE ONE TABLET BY MOUTH DAILY WITH  SUPPER 30 tablet 6   No current facility-administered medications for this visit.     Allergies as of 05/09/2016 - Review Complete 05/09/2016  Allergen Reaction Noted  . Ciprofloxacin Cough 10/17/2013  . Dexlansoprazole  09/13/2011  . Statins Other (See Comments) 05/30/2012    Family History  Problem Relation Age of Onset  . Brain cancer Mother   . Atrial fibrillation Other   . Colon cancer Neg Hx     Social History   Social History  . Marital status: Widowed    Spouse name: N/A  . Number of children: 2  . Years of education: N/A   Social History Main Topics  . Smoking status: Never Smoker  . Smokeless tobacco: Never Used  . Alcohol use No  . Drug use: No  . Sexual activity: No   Other Topics Concern  . None   Social History Narrative   Widowed          Review of Systems: General: Negative for anorexia, weight loss, fever, chills, fatigue, weakness. ENT: Negative for hoarseness, difficulty swallowing. CV: Negative for chest pain, angina, palpitations, peripheral edema.  Respiratory: Negative for dyspnea at rest, cough, sputum, wheezing.  GI: See history of present illness. MS: Admits bilateral hip pain.  Derm: Negative for rash or itching.  Endo: Negative for unusual weight change.    Physical Exam: BP (!) 143/82   Pulse 67   Temp 98 F (36.7 C) (Oral)   Ht 5\' 7"  (1.702 m)   Wt 230 lb 6.4 oz (104.5 kg)   BMI 36.09 kg/m  General:   Alert and oriented. Pleasant and cooperative. Well-nourished and well-developed.  Ears:  Normal auditory acuity. Cardiovascular:  S1, S2  present without murmurs appreciated. Extremities without clubbing or edema. PPM/Defib device noted left upper chest.  Respiratory:  Clear to auscultation bilaterally. No wheezes, rales, or rhonchi. No distress.  Gastrointestinal:  +BS, rounded but soft, non-tender and non-distended. No HSM noted. No guarding or rebound. No masses appreciated.  Rectal:  Deferred  Musculoskalatal:  Symmetrical without gross deformities. Neurologic:  Alert and oriented x4;  grossly normal neurologically. Psych:  Alert and cooperative. Normal mood and affect. Heme/Lymph/Immune: No excessive bruising noted.    05/09/2016 9:50 AM   Disclaimer: This note was dictated with voice recognition software. Similar sounding words can inadvertently be transcribed and may not be corrected upon review.

## 2016-05-09 NOTE — Assessment & Plan Note (Signed)
Symptoms essentially resolved on Prevacid twice a day. Discussed dose de-escalation, but states she prefers to continue taking it twice a day for now. We will repeat approach de-escalation of her dose in 6 months at follow-up.

## 2016-05-09 NOTE — Progress Notes (Signed)
cc'ed to pcp °

## 2016-05-09 NOTE — Patient Instructions (Signed)
1. Continue your current medications. 2. Return for follow-up in 6 months. 3. Call us if you have worsening problems before then.

## 2016-05-17 ENCOUNTER — Ambulatory Visit (INDEPENDENT_AMBULATORY_CARE_PROVIDER_SITE_OTHER): Payer: Medicare Other | Admitting: *Deleted

## 2016-05-17 DIAGNOSIS — I5022 Chronic systolic (congestive) heart failure: Secondary | ICD-10-CM

## 2016-05-17 DIAGNOSIS — I429 Cardiomyopathy, unspecified: Secondary | ICD-10-CM

## 2016-05-17 NOTE — Progress Notes (Signed)
Remote ICD transmission.   

## 2016-05-24 ENCOUNTER — Encounter: Payer: Self-pay | Admitting: Cardiology

## 2016-06-27 LAB — CUP PACEART REMOTE DEVICE CHECK
Battery Remaining Longevity: 65 mo
Brady Statistic AP VS Percent: 0.02 %
Brady Statistic RA Percent Paced: 5.74 %
Date Time Interrogation Session: 20171101160438
HighPow Impedance: 80 Ohm
Implantable Lead Implant Date: 20150417
Implantable Lead Implant Date: 20150417
Implantable Lead Location: 753858
Implantable Pulse Generator Implant Date: 20150417
Lead Channel Impedance Value: 361 Ohm
Lead Channel Impedance Value: 456 Ohm
Lead Channel Impedance Value: 456 Ohm
Lead Channel Impedance Value: 456 Ohm
Lead Channel Impedance Value: 513 Ohm
Lead Channel Impedance Value: 703 Ohm
Lead Channel Impedance Value: 779 Ohm
Lead Channel Pacing Threshold Pulse Width: 0.4 ms
Lead Channel Pacing Threshold Pulse Width: 0.4 ms
Lead Channel Sensing Intrinsic Amplitude: 1.625 mV
Lead Channel Sensing Intrinsic Amplitude: 8.5 mV
Lead Channel Setting Pacing Amplitude: 2 V
Lead Channel Setting Pacing Pulse Width: 0.4 ms
Lead Channel Setting Pacing Pulse Width: 0.4 ms
MDC IDC LEAD IMPLANT DT: 20150417
MDC IDC LEAD LOCATION: 753859
MDC IDC LEAD LOCATION: 753860
MDC IDC LEAD MODEL: 4298
MDC IDC LEAD MODEL: 6935
MDC IDC MSMT BATTERY VOLTAGE: 2.97 V
MDC IDC MSMT LEADCHNL LV IMPEDANCE VALUE: 399 Ohm
MDC IDC MSMT LEADCHNL LV IMPEDANCE VALUE: 399 Ohm
MDC IDC MSMT LEADCHNL LV IMPEDANCE VALUE: 418 Ohm
MDC IDC MSMT LEADCHNL LV IMPEDANCE VALUE: 665 Ohm
MDC IDC MSMT LEADCHNL LV IMPEDANCE VALUE: 665 Ohm
MDC IDC MSMT LEADCHNL LV IMPEDANCE VALUE: 760 Ohm
MDC IDC MSMT LEADCHNL LV PACING THRESHOLD AMPLITUDE: 0.875 V
MDC IDC MSMT LEADCHNL LV PACING THRESHOLD PULSEWIDTH: 0.4 ms
MDC IDC MSMT LEADCHNL RA PACING THRESHOLD AMPLITUDE: 0.5 V
MDC IDC MSMT LEADCHNL RA SENSING INTR AMPL: 1.625 mV
MDC IDC MSMT LEADCHNL RV PACING THRESHOLD AMPLITUDE: 0.625 V
MDC IDC MSMT LEADCHNL RV SENSING INTR AMPL: 8.5 mV
MDC IDC SET LEADCHNL LV PACING AMPLITUDE: 2 V
MDC IDC SET LEADCHNL RV PACING AMPLITUDE: 2.5 V
MDC IDC SET LEADCHNL RV SENSING SENSITIVITY: 0.3 mV
MDC IDC STAT BRADY AP VP PERCENT: 5.72 %
MDC IDC STAT BRADY AS VP PERCENT: 92.21 %
MDC IDC STAT BRADY AS VS PERCENT: 2.06 %
MDC IDC STAT BRADY RV PERCENT PACED: 93.54 %

## 2016-07-18 ENCOUNTER — Other Ambulatory Visit: Payer: Self-pay | Admitting: Cardiovascular Disease

## 2016-08-11 DIAGNOSIS — I4891 Unspecified atrial fibrillation: Secondary | ICD-10-CM | POA: Diagnosis not present

## 2016-08-11 DIAGNOSIS — I10 Essential (primary) hypertension: Secondary | ICD-10-CM | POA: Diagnosis not present

## 2016-08-11 DIAGNOSIS — J209 Acute bronchitis, unspecified: Secondary | ICD-10-CM | POA: Diagnosis not present

## 2016-08-11 DIAGNOSIS — I5022 Chronic systolic (congestive) heart failure: Secondary | ICD-10-CM | POA: Diagnosis not present

## 2016-08-16 ENCOUNTER — Ambulatory Visit (INDEPENDENT_AMBULATORY_CARE_PROVIDER_SITE_OTHER): Payer: Medicare Other | Admitting: *Deleted

## 2016-08-16 DIAGNOSIS — I5022 Chronic systolic (congestive) heart failure: Secondary | ICD-10-CM | POA: Diagnosis not present

## 2016-08-16 DIAGNOSIS — I429 Cardiomyopathy, unspecified: Secondary | ICD-10-CM

## 2016-08-16 NOTE — Progress Notes (Signed)
Remote ICD transmission.   

## 2016-08-17 ENCOUNTER — Encounter: Payer: Self-pay | Admitting: Cardiology

## 2016-08-20 LAB — CUP PACEART REMOTE DEVICE CHECK
Battery Remaining Longevity: 58 mo
Battery Voltage: 2.97 V
Brady Statistic AP VP Percent: 9.68 %
Brady Statistic AS VS Percent: 2.37 %
Brady Statistic RA Percent Paced: 9.32 %
Brady Statistic RV Percent Paced: 92.19 %
HighPow Impedance: 76 Ohm
Implantable Lead Implant Date: 20150417
Implantable Lead Location: 753858
Implantable Lead Location: 753860
Implantable Lead Model: 4298
Implantable Lead Model: 5076
Implantable Lead Model: 6935
Lead Channel Impedance Value: 342 Ohm
Lead Channel Impedance Value: 361 Ohm
Lead Channel Impedance Value: 399 Ohm
Lead Channel Impedance Value: 418 Ohm
Lead Channel Impedance Value: 646 Ohm
Lead Channel Impedance Value: 665 Ohm
Lead Channel Impedance Value: 722 Ohm
Lead Channel Pacing Threshold Amplitude: 0.625 V
Lead Channel Pacing Threshold Amplitude: 0.875 V
Lead Channel Sensing Intrinsic Amplitude: 1.625 mV
Lead Channel Sensing Intrinsic Amplitude: 1.625 mV
Lead Channel Sensing Intrinsic Amplitude: 10.875 mV
Lead Channel Sensing Intrinsic Amplitude: 10.875 mV
Lead Channel Setting Pacing Amplitude: 2 V
Lead Channel Setting Pacing Pulse Width: 0.4 ms
MDC IDC LEAD IMPLANT DT: 20150417
MDC IDC LEAD IMPLANT DT: 20150417
MDC IDC LEAD LOCATION: 753859
MDC IDC MSMT LEADCHNL LV IMPEDANCE VALUE: 399 Ohm
MDC IDC MSMT LEADCHNL LV IMPEDANCE VALUE: 456 Ohm
MDC IDC MSMT LEADCHNL LV IMPEDANCE VALUE: 608 Ohm
MDC IDC MSMT LEADCHNL LV IMPEDANCE VALUE: 703 Ohm
MDC IDC MSMT LEADCHNL LV PACING THRESHOLD PULSEWIDTH: 0.4 ms
MDC IDC MSMT LEADCHNL RA IMPEDANCE VALUE: 456 Ohm
MDC IDC MSMT LEADCHNL RA PACING THRESHOLD AMPLITUDE: 0.5 V
MDC IDC MSMT LEADCHNL RA PACING THRESHOLD PULSEWIDTH: 0.4 ms
MDC IDC MSMT LEADCHNL RV IMPEDANCE VALUE: 399 Ohm
MDC IDC MSMT LEADCHNL RV PACING THRESHOLD PULSEWIDTH: 0.4 ms
MDC IDC PG IMPLANT DT: 20150417
MDC IDC SESS DTM: 20180131052210
MDC IDC SET LEADCHNL RA PACING AMPLITUDE: 2 V
MDC IDC SET LEADCHNL RV PACING AMPLITUDE: 2.5 V
MDC IDC SET LEADCHNL RV PACING PULSEWIDTH: 0.4 ms
MDC IDC SET LEADCHNL RV SENSING SENSITIVITY: 0.3 mV
MDC IDC STAT BRADY AP VS PERCENT: 0.02 %
MDC IDC STAT BRADY AS VP PERCENT: 87.93 %

## 2016-08-31 ENCOUNTER — Ambulatory Visit (HOSPITAL_COMMUNITY)
Admission: RE | Admit: 2016-08-31 | Discharge: 2016-08-31 | Disposition: A | Discharge status: Home / Self Care | Payer: Medicare Other | Source: Ambulatory Visit | Attending: Pulmonary Disease | Admitting: Pulmonary Disease

## 2016-08-31 ENCOUNTER — Other Ambulatory Visit (HOSPITAL_COMMUNITY): Payer: Self-pay | Admitting: Pulmonary Disease

## 2016-08-31 DIAGNOSIS — M542 Cervicalgia: Secondary | ICD-10-CM | POA: Insufficient documentation

## 2016-08-31 DIAGNOSIS — J209 Acute bronchitis, unspecified: Secondary | ICD-10-CM | POA: Diagnosis not present

## 2016-08-31 DIAGNOSIS — I4891 Unspecified atrial fibrillation: Secondary | ICD-10-CM | POA: Insufficient documentation

## 2016-08-31 DIAGNOSIS — I5022 Chronic systolic (congestive) heart failure: Secondary | ICD-10-CM | POA: Diagnosis not present

## 2016-09-02 ENCOUNTER — Other Ambulatory Visit: Payer: Self-pay | Admitting: Cardiovascular Disease

## 2016-09-08 ENCOUNTER — Encounter: Payer: Self-pay | Admitting: Internal Medicine

## 2016-09-29 DIAGNOSIS — H25813 Combined forms of age-related cataract, bilateral: Secondary | ICD-10-CM | POA: Diagnosis not present

## 2016-09-29 DIAGNOSIS — H52223 Regular astigmatism, bilateral: Secondary | ICD-10-CM | POA: Diagnosis not present

## 2016-09-29 DIAGNOSIS — H5203 Hypermetropia, bilateral: Secondary | ICD-10-CM | POA: Diagnosis not present

## 2016-09-29 DIAGNOSIS — H524 Presbyopia: Secondary | ICD-10-CM | POA: Diagnosis not present

## 2016-10-12 ENCOUNTER — Other Ambulatory Visit (HOSPITAL_COMMUNITY): Payer: Self-pay | Admitting: Pulmonary Disease

## 2016-10-12 DIAGNOSIS — M79601 Pain in right arm: Secondary | ICD-10-CM | POA: Diagnosis not present

## 2016-10-12 DIAGNOSIS — I4891 Unspecified atrial fibrillation: Secondary | ICD-10-CM | POA: Diagnosis not present

## 2016-10-12 DIAGNOSIS — M542 Cervicalgia: Secondary | ICD-10-CM | POA: Diagnosis not present

## 2016-10-12 DIAGNOSIS — I10 Essential (primary) hypertension: Secondary | ICD-10-CM | POA: Diagnosis not present

## 2016-10-12 DIAGNOSIS — I429 Cardiomyopathy, unspecified: Secondary | ICD-10-CM | POA: Diagnosis not present

## 2016-10-27 ENCOUNTER — Other Ambulatory Visit (HOSPITAL_COMMUNITY): Payer: Self-pay | Admitting: Pulmonary Disease

## 2016-10-27 ENCOUNTER — Ambulatory Visit (HOSPITAL_COMMUNITY)
Admission: RE | Admit: 2016-10-27 | Discharge: 2016-10-27 | Disposition: A | Discharge status: Home / Self Care | Payer: Medicare Other | Source: Ambulatory Visit | Attending: Pulmonary Disease | Admitting: Pulmonary Disease

## 2016-10-27 DIAGNOSIS — M47812 Spondylosis without myelopathy or radiculopathy, cervical region: Secondary | ICD-10-CM | POA: Diagnosis not present

## 2016-10-27 DIAGNOSIS — M79601 Pain in right arm: Secondary | ICD-10-CM | POA: Insufficient documentation

## 2016-10-27 DIAGNOSIS — M542 Cervicalgia: Secondary | ICD-10-CM

## 2016-11-06 DIAGNOSIS — Z1231 Encounter for screening mammogram for malignant neoplasm of breast: Secondary | ICD-10-CM | POA: Diagnosis not present

## 2016-11-07 ENCOUNTER — Ambulatory Visit (INDEPENDENT_AMBULATORY_CARE_PROVIDER_SITE_OTHER): Payer: Medicare Other | Admitting: Nurse Practitioner

## 2016-11-07 ENCOUNTER — Encounter: Payer: Self-pay | Admitting: Nurse Practitioner

## 2016-11-07 VITALS — BP 137/77 | HR 71 | Temp 97.1°F | Ht 67.0 in | Wt 232.8 lb

## 2016-11-07 DIAGNOSIS — K5900 Constipation, unspecified: Secondary | ICD-10-CM | POA: Diagnosis not present

## 2016-11-07 DIAGNOSIS — K219 Gastro-esophageal reflux disease without esophagitis: Secondary | ICD-10-CM

## 2016-11-07 MED ORDER — LINACLOTIDE 145 MCG PO CAPS: 145.0000 ug | ORAL_CAPSULE | Freq: Every day | ORAL | 0 refills | Status: DC

## 2016-11-07 NOTE — Patient Instructions (Signed)
1. Stop taking Linzess 72 g. 2. I'm giving you samples of Linzess 145 g that will last for about 2 weeks. 3. Call us in 1-2 weeks and let us know if it is working. 4. Keep taking your AcipHex. 5. Return for follow-up in 3 months.

## 2016-11-07 NOTE — Assessment & Plan Note (Signed)
Constipation initially responded well to 72 g of Linzess. This stopped working. I will increase her dose to 145 g and provide samples to last 2 weeks. I've requested she call with a progress report in 1-2 weeks for any other needed medication adjustments. Return for follow-up in 3 months.

## 2016-11-07 NOTE — Progress Notes (Signed)
Referring Provider: Sinda Du, MD Primary Care Physician:  Alonza Bogus, MD Primary GI:  Dr. Gala Romney  Chief Complaint  Patient presents with  . Gastroesophageal Reflux  . Constipation    "Linzess 78mcg quit working"    HPI:   [name] is a 76 y.o. female who presents For follow-up on GERD and constipation. The patient was last seen in our office 05/09/2016 for the same. At the time of her last visit she was doing well with constipation very well controlled on Linzess 72 g every other day, GERD symptoms resolved on Prevacid every day. No GI symptoms. Recommended continue current medications, follow-up in 6 months. Call if any worsening problems before then. No phone note noted in our system.  Today she states she's doing well overall. Linzess has stopped working. Was taking 72 mcg which worked well for a while but stopped. Would like to try 145 mcg dose. GERD is doing well with Prevacid bid. Has N/V about every 3-4 months, but not regularly occurring and has a medication to take when it happens which helps. Denies abdominal pain, other N/V, hematochezia, melena, unintentional weight loss, acute changes in bowel habits (other than constipation as per above). Denies any other upper or lower GI symptoms.  Past Medical History:  Diagnosis Date  . 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 Cerebrovascular accident [facility])    No clinical event; asymptomatic CT finding  . DDD (degenerative disc disease), cervical    Cervical  . Degenerative joint disease    Right hip and knee  . GERD (gastroesophageal reflux disease)   . Hip dislocation, right ([place])   . Hyperlipemia   . Hypertension   . Hypothyroid   . LBBB (left bundle branch block)   . S/P colonoscopy 2009   Dr. Sharlett Iles: reportedly normal per pt  . S/P endoscopy 1980s   per pt; normal. performed secondary to reflux     Past Surgical History:  Procedure Laterality Date  . ABDOMINAL HYSTERECTOMY    . APPENDECTOMY    . BI-VENTRICULAR IMPLANTABLE CARDIOVERTER DEFIBRILLATOR  (CRT-D)  10-31-2013   MDT Hillery Aldo CRTD implanted by Dr Lovena Le  . BREAST BIOPSY     bilaterally; benign  . can not have MRI due to jaw surgery    . CHOLECYSTECTOMY    . COLONOSCOPY  2009  . ESOPHAGOGASTRODUODENOSCOPY  07/13/2011   small hh, patchy gastric erythema with scarring deformity of antrum, chronic gastritis, bx benign with no H.Pylori. Procedure: ESOPHAGOGASTRODUODENOSCOPY (EGD);  Surgeon: Daneil Dolin, MD;  [facility];  Service: Endoscopy;  Laterality: N/A;  10:45  . IMPLANTABLE CARDIOVERTER DEFIBRILLATOR IMPLANT N/A 10/31/2013   Procedure: IMPLANTABLE CARDIOVERTER DEFIBRILLATOR IMPLANT;  Surgeon: Evans Lance, MD;  Location: [facility] CATH LAB;  Service: Cardiovascular;  Laterality: N/A;  . MANDIBLE SURGERY     secondary to malocclusion  . THYROIDECTOMY     neoplasm  . Ureteral Surgery  1970    Current Outpatient Prescriptions  Medication Sig Dispense Refill  . benazepril (LOTENSIN) 40 MG tablet TAKE ONE TABLET BY MOUTH IN THE MORNING 90 tablet 3  . carvedilol (COREG) 25 MG tablet TAKE ONE-HALF TABLET BY MOUTH IN THE MORNING AND ONE TABLET BY MOUTH IN THE EVENING/AT BEDTIME 60 tablet 3  . cholecalciferol (VITAMIN D) 1000 UNITS tablet Take 1,000 Units by mouth daily.    . fexofenadine (ALLEGRA)  180 MG tablet Take 180 mg by mouth every morning.     . fluticasone (FLONASE) 50 MCG/ACT nasal spray Place 2 sprays into the nose daily as needed for allergies.     . furosemide (LASIX) 40 MG tablet Take 1 tablet (40 mg total) by mouth 2 (two) times daily. Take As Directed 180 tablet 3  . lansoprazole (PREVACID) 15 MG capsule Take 15 mg by mouth 2 (two) times daily before a meal.    . levothyroxine (SYNTHROID, LEVOTHROID) 112 MCG tablet Take 112 mcg by mouth daily before breakfast.    . linaclotide (LINZESS) 72 MCG  capsule Take 1 capsule (72 mcg total) by mouth daily before breakfast. (Patient taking differently: Take 72 mcg by mouth daily. ) 30 capsule 3  . Multiple Vitamins-Minerals (CENTRUM SILVER PO) Take 1 tablet by mouth daily.     Marland Kitchen spironolactone (ALDACTONE) 25 MG tablet TAKE ONE-HALF TABLET BY MOUTH EVERY OTHER DAY 22 tablet 6  . traMADol (ULTRAM) 50 MG tablet Take 50 mg by mouth every 6 (six) hours as needed for moderate pain.     Marland Kitchen XARELTO 20 MG TABS tablet TAKE ONE TABLET BY MOUTH DAILY WITH  SUPPER 30 tablet 6   No current facility-administered medications for this visit.     Allergies as of 11/07/2016 - Review Complete 11/07/2016  Allergen Reaction Noted  . Ciprofloxacin Cough 10/17/2013  . Dexlansoprazole  09/13/2011  . Statins Other (See Comments) 05/30/2012    Family History  Problem Relation Age of Onset  . Brain cancer Mother   . Atrial fibrillation Other   . Colon cancer Neg Hx     Social History   Social History  . Marital status: Widowed    Spouse name: N/A  . Number of children: 2  . Years of education: N/A   Social History Main Topics  . Smoking status: Never Smoker  . Smokeless tobacco: Never Used  . Alcohol use No  . Drug use: No  . Sexual activity: No   Other Topics Concern  . None   Social History Narrative   Widowed          Review of Systems: General: Negative for anorexia, weight loss, fever, chills, fatigue, weakness. ENT: Negative for hoarseness, difficulty swallowing. CV: Negative for chest pain, angina, palpitations, peripheral edema.  Respiratory: Negative for dyspnea at rest, cough, sputum, wheezing.  GI: See history of present illness. MS: Chronic joint pain.  Derm: Negative for rash or itching.  Endo: Negative for unusual weight change.  Heme: Negative for bruising or bleeding. Allergy: Negative for rash or hives.   Physical Exam: BP 137/77   Pulse 71   Temp 97.1 F (36.2 C) (Oral)   Ht 5\' 7"  (1.702 m)   Wt 232 lb 12.8 oz  (105.6 kg)   BMI 36.46 kg/m  General:   Alert and oriented. Pleasant and cooperative. Well-nourished and well-developed.  Eyes:  Without icterus, sclera clear and conjunctiva pink.  Ears:  Normal auditory acuity. Cardiovascular:  S1, S2 present without murmurs appreciated. Extremities without clubbing or edema. Respiratory:  Clear to auscultation bilaterally. No wheezes, rales, or rhonchi. No distress.  Gastrointestinal:  +BS, rounded but soft, non-tender and non-distended. No HSM noted. No guarding or rebound. No masses appreciated.  Rectal:  Deferred  Musculoskalatal:  Symmetrical without gross deformities. Skin:  Intact without significant lesions or rashes. Neurologic:  Alert and oriented x4;  grossly normal neurologically. Psych:  Alert and cooperative. Normal mood and affect. Heme/Lymph/Immune:  No excessive bruising noted.    11/07/2016 11:12 AM   Disclaimer: This note was dictated with voice recognition software. Similar sounding words can inadvertently be transcribed and may not be corrected upon review.

## 2016-11-07 NOTE — Progress Notes (Signed)
cc'ed to pcp °

## 2016-11-07 NOTE — Assessment & Plan Note (Signed)
GERD well-controlled on AcipHex twice a day. Recommend continue PPI, return for follow-up in 3 months.

## 2016-11-13 ENCOUNTER — Other Ambulatory Visit: Payer: Self-pay | Admitting: Internal Medicine

## 2016-11-23 ENCOUNTER — Telehealth: Payer: Self-pay | Admitting: Internal Medicine

## 2016-11-23 DIAGNOSIS — K5900 Constipation, unspecified: Secondary | ICD-10-CM

## 2016-11-23 DIAGNOSIS — K219 Gastro-esophageal reflux disease without esophagitis: Secondary | ICD-10-CM

## 2016-11-23 MED ORDER — LINACLOTIDE 145 MCG PO CAPS: 145.0000 ug | ORAL_CAPSULE | Freq: Every day | ORAL | 5 refills | Status: DC

## 2016-11-23 NOTE — Addendum Note (Signed)
Addended by: Gordy Levan, ERIC A on: 11/23/2016 02:52 PM   Modules accepted: Orders

## 2016-11-23 NOTE — Telephone Encounter (Signed)
Routing to EG 

## 2016-11-23 NOTE — Telephone Encounter (Signed)
Please tell the patient that the Rx was sent to her pharmacy per her request.

## 2016-11-23 NOTE — Telephone Encounter (Signed)
Pt said that her samples of Linzess are working well for her and to call in a prescription to [facility] in [place].

## 2016-11-24 NOTE — Telephone Encounter (Signed)
done

## 2016-12-12 ENCOUNTER — Other Ambulatory Visit: Payer: Self-pay | Admitting: Pulmonary Disease

## 2016-12-12 DIAGNOSIS — M542 Cervicalgia: Secondary | ICD-10-CM

## 2016-12-20 ENCOUNTER — Telehealth: Payer: Self-pay

## 2016-12-20 NOTE — Telephone Encounter (Signed)
Sent clearance document to medical records to be faxed to [facility] [phone]. Dr. Lovena Le stated "may hold Xarelto as above".

## 2016-12-21 ENCOUNTER — Encounter: Payer: Self-pay | Admitting: Internal Medicine

## 2016-12-21 ENCOUNTER — Ambulatory Visit (INDEPENDENT_AMBULATORY_CARE_PROVIDER_SITE_OTHER): Payer: Medicare Other | Admitting: Internal Medicine

## 2016-12-21 VITALS — BP 152/82 | HR 76 | Ht 67.0 in | Wt 237.0 lb

## 2016-12-21 DIAGNOSIS — Z95810 Presence of automatic (implantable) cardiac defibrillator: Secondary | ICD-10-CM | POA: Diagnosis not present

## 2016-12-21 DIAGNOSIS — I480 Paroxysmal atrial fibrillation: Secondary | ICD-10-CM

## 2016-12-21 DIAGNOSIS — I10 Essential (primary) hypertension: Secondary | ICD-10-CM | POA: Diagnosis not present

## 2016-12-21 DIAGNOSIS — I5022 Chronic systolic (congestive) heart failure: Secondary | ICD-10-CM | POA: Diagnosis not present

## 2016-12-21 NOTE — Patient Instructions (Addendum)
Your physician wants you to follow-up in: 1 Year with Dr. Lovena Le.  You will receive a reminder letter in the mail two months in advance. If you don't receive a letter, please call our office to schedule the follow-up appointment.  Your physician recommends that you continue on your current medications as directed. Please refer to the Current Medication list given to you today.  Remote monitoring is used to monitor your Pacemaker of ICD from home. This monitoring reduces the number of office visits required to check your device to one time per year. It allows [place] to keep an eye on the functioning of your device to ensure it is working properly. You are scheduled for a device check from home on 03/22/17. You may send your transmission at any time that day. If you have a wireless device, the transmission will be sent automatically. After your physician reviews your transmission, you will receive a postcard with your next transmission date.  You have been given samples of Xarelto 20 mg today.   If you need a refill on your cardiac medications before your next appointment, please call your pharmacy.  Thank you for choosing [facility]!

## 2016-12-21 NOTE — Progress Notes (Signed)
HPI .[name] returns today for followup. She is a pleasant 76 yo woman who has LBBB, chronic systolic heart failure and atrial fib. Her ICD insertion site was previously very tender but has slowly improved. Her dyspnea is improved but still not as good as she would like. She denies palpitations. She has been bothered by arthritis. She has had some tenderness in her left arm and shoulder, also her hip and knee. She is frustrated by the cost of her xarelto. Allergies  Allergen Reactions  . Ciprofloxacin Cough  . Dexlansoprazole     Lower abdominal pain.  . Statins Other (See Comments)    Arthralgias during treatment with at least 2 members of his class      Current Outpatient Prescriptions  Medication Sig Dispense Refill  . benazepril (LOTENSIN) 40 MG tablet TAKE ONE TABLET BY MOUTH IN THE MORNING 90 tablet 3  . carvedilol (COREG) 25 MG tablet TAKE ONE-HALF TABLET BY MOUTH IN THE MORNING AND ONE TABLET BY MOUTH IN THE EVENING/AT BEDTIME 60 tablet 3  . cholecalciferol (VITAMIN D) 1000 UNITS tablet Take 1,000 Units by mouth daily.    . fexofenadine (ALLEGRA) 180 MG tablet Take 180 mg by mouth every morning.     . fluticasone (FLONASE) 50 MCG/ACT nasal spray Place 2 sprays into the nose daily as needed for allergies.     . furosemide (LASIX) 40 MG tablet Take 1 tablet (40 mg total) by mouth 2 (two) times daily. Take As Directed 180 tablet 3  . lansoprazole (PREVACID) 15 MG capsule Take 15 mg by mouth 2 (two) times daily before a meal.    . levothyroxine (SYNTHROID, LEVOTHROID) 112 MCG tablet Take 112 mcg by mouth daily before breakfast.    . linaclotide (LINZESS) 145 MCG CAPS capsule Take 1 capsule (145 mcg total) by mouth daily before breakfast. 30 capsule 5  . Multiple Vitamins-Minerals (CENTRUM SILVER PO) Take 1 tablet by mouth daily.     Marland Kitchen spironolactone (ALDACTONE) 25 MG tablet TAKE ONE-HALF TABLET BY MOUTH EVERY OTHER DAY 22 tablet 6  . traMADol (ULTRAM) 50 MG tablet Take 50  mg by mouth every 6 (six) hours as needed for moderate pain.     Marland Kitchen XARELTO 20 MG TABS tablet TAKE ONE TABLET BY MOUTH ONCE DAILY WITH SUPPER 90 tablet 1   No current facility-administered medications for this visit.      Past Medical History:  Diagnosis Date  . 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 Cerebrovascular accident [facility])    No clinical event; asymptomatic CT finding  . DDD (degenerative disc disease), cervical    Cervical  . Degenerative joint disease    Right hip and knee  . GERD (gastroesophageal reflux disease)   . Hip dislocation, right ([place])   . Hyperlipemia   . Hypertension   . Hypothyroid   . LBBB (left bundle branch block)   . S/P colonoscopy 2009   Dr. Sharlett Iles: reportedly normal per pt  . S/P endoscopy 1980s   per pt; normal. performed secondary to reflux    ROS:   All systems reviewed and negative except as noted in the HPI.   Past Surgical History:  Procedure Laterality Date  . ABDOMINAL HYSTERECTOMY    . APPENDECTOMY    . BI-VENTRICULAR IMPLANTABLE CARDIOVERTER DEFIBRILLATOR  (CRT-D)  10-31-2013   MDT Hillery Aldo CRTD implanted by  Dr Lovena Le  . BREAST BIOPSY     bilaterally; benign  . can not have MRI due to jaw surgery    . CHOLECYSTECTOMY    . COLONOSCOPY  2009  . ESOPHAGOGASTRODUODENOSCOPY  07/13/2011   small hh, patchy gastric erythema with scarring deformity of antrum, chronic gastritis, bx benign with no H.Pylori. Procedure: ESOPHAGOGASTRODUODENOSCOPY (EGD);  Surgeon: Daneil Dolin, MD;  [facility];  Service: Endoscopy;  Laterality: N/A;  10:45  . IMPLANTABLE CARDIOVERTER DEFIBRILLATOR IMPLANT N/A 10/31/2013   Procedure: IMPLANTABLE CARDIOVERTER DEFIBRILLATOR IMPLANT;  Surgeon: Evans Lance, MD;  Location: [facility] CATH LAB;  Service: Cardiovascular;  Laterality: N/A;  . MANDIBLE SURGERY     secondary to malocclusion  .  THYROIDECTOMY     neoplasm  . Ureteral Surgery  1970     Family History  Problem Relation Age of Onset  . Brain cancer Mother   . Atrial fibrillation Other   . Colon cancer Neg Hx      Social History   Social History  . Marital status: Widowed    Spouse name: N/A  . Number of children: 2  . Years of education: N/A   Occupational History  . Not on file.   Social History Main Topics  . Smoking status: Never Smoker  . Smokeless tobacco: Never Used  . Alcohol use No  . Drug use: No  . Sexual activity: No   Other Topics Concern  . Not on file   Social History Narrative   Widowed           BP (!) 152/82 Comment: had not taken blood pressure pill this morning  Pulse 76   Ht 5\' 7"  (1.702 m)   Wt 237 lb (107.5 kg)   SpO2 95%   BMI 37.12 kg/m   Physical Exam:  Well appearing 76 yo woman, NAD HEENT: Unremarkable Neck:  6 cm JVD, no thyromegally Back:  No CVA tenderness Lungs:  Clear with no wheezes. She has no erythema around her incision HEART:  Regular rate rhythm, no murmurs, no rubs, no clicks Abd:  soft, positive bowel sounds, no organomegally, no rebound, no guarding Ext:  2 plus pulses, 1-2+ peripheral edema, no cyanosis, no clubbing Skin:  No rashes no nodules Neuro:  CN II through XII intact, motor grossly intact   DEVICE  Normal device function.  See PaceArt for details.   Assess/Plan: 1. Chronic systolic heart failure - her symptoms are class 2. No change in her meds. She is encouraged to increase her physical activity. 2. ICD - her medtronic BiV ICD is working normally. Her optivol is good. 3. HTN - her blood pressure is well controlled. No change in meds. 4. PAF - she is maintaining NSR over 99% of the time.  Mikle Bosworth.D.

## 2016-12-27 LAB — CUP PACEART INCLINIC DEVICE CHECK
Battery Voltage: 2.96 V
Brady Statistic RA Percent Paced: 6.44 %
Brady Statistic RV Percent Paced: 93.09 %
Date Time Interrogation Session: 20180607141756
HIGH POWER IMPEDANCE MEASURED VALUE: 70 Ohm
Implantable Lead Implant Date: 20150417
Implantable Lead Implant Date: 20150417
Implantable Lead Location: 753858
Implantable Lead Location: 753859
Implantable Lead Model: 4298
Implantable Lead Model: 6935
Implantable Pulse Generator Implant Date: 20150417
Lead Channel Impedance Value: 342 Ohm
Lead Channel Impedance Value: 361 Ohm
Lead Channel Impedance Value: 608 Ohm
Lead Channel Impedance Value: 608 Ohm
Lead Channel Impedance Value: 646 Ohm
Lead Channel Pacing Threshold Amplitude: 0.5 V
Lead Channel Pacing Threshold Amplitude: 0.75 V
Lead Channel Pacing Threshold Pulse Width: 0.4 ms
Lead Channel Sensing Intrinsic Amplitude: 7.75 mV
Lead Channel Setting Pacing Amplitude: 2.25 V
Lead Channel Setting Pacing Amplitude: 2.5 V
Lead Channel Setting Pacing Pulse Width: 0.4 ms
Lead Channel Setting Sensing Sensitivity: 0.3 mV
MDC IDC LEAD IMPLANT DT: 20150417
MDC IDC LEAD LOCATION: 753860
MDC IDC MSMT BATTERY REMAINING LONGEVITY: 50 mo
MDC IDC MSMT LEADCHNL LV IMPEDANCE VALUE: 342 Ohm
MDC IDC MSMT LEADCHNL LV IMPEDANCE VALUE: 399 Ohm
MDC IDC MSMT LEADCHNL LV IMPEDANCE VALUE: 399 Ohm
MDC IDC MSMT LEADCHNL LV IMPEDANCE VALUE: 399 Ohm
MDC IDC MSMT LEADCHNL LV IMPEDANCE VALUE: 646 Ohm
MDC IDC MSMT LEADCHNL LV IMPEDANCE VALUE: 665 Ohm
MDC IDC MSMT LEADCHNL LV PACING THRESHOLD AMPLITUDE: 0.75 V
MDC IDC MSMT LEADCHNL RA IMPEDANCE VALUE: 456 Ohm
MDC IDC MSMT LEADCHNL RA PACING THRESHOLD PULSEWIDTH: 0.4 ms
MDC IDC MSMT LEADCHNL RA SENSING INTR AMPL: 1 mV
MDC IDC MSMT LEADCHNL RA SENSING INTR AMPL: 1.375 mV
MDC IDC MSMT LEADCHNL RV IMPEDANCE VALUE: 399 Ohm
MDC IDC MSMT LEADCHNL RV PACING THRESHOLD PULSEWIDTH: 0.4 ms
MDC IDC MSMT LEADCHNL RV SENSING INTR AMPL: 10.5 mV
MDC IDC SET LEADCHNL RA PACING AMPLITUDE: 2 V
MDC IDC SET LEADCHNL RV PACING PULSEWIDTH: 0.4 ms
MDC IDC STAT BRADY AP VP PERCENT: 6.62 %
MDC IDC STAT BRADY AP VS PERCENT: 0.02 %
MDC IDC STAT BRADY AS VP PERCENT: 91.06 %
MDC IDC STAT BRADY AS VS PERCENT: 2.31 %

## 2017-01-03 ENCOUNTER — Other Ambulatory Visit: Payer: Self-pay | Admitting: Cardiovascular Disease

## 2017-01-10 DIAGNOSIS — I10 Essential (primary) hypertension: Secondary | ICD-10-CM | POA: Diagnosis not present

## 2017-01-10 DIAGNOSIS — I5022 Chronic systolic (congestive) heart failure: Secondary | ICD-10-CM | POA: Diagnosis not present

## 2017-01-10 DIAGNOSIS — I4891 Unspecified atrial fibrillation: Secondary | ICD-10-CM | POA: Diagnosis not present

## 2017-01-10 DIAGNOSIS — R42 Dizziness and giddiness: Secondary | ICD-10-CM | POA: Diagnosis not present

## 2017-01-10 LAB — VITAMIN D 25 HYDROXY (VIT D DEFICIENCY, FRACTURES): Vit D, 25-Hydroxy: 21

## 2017-02-07 ENCOUNTER — Ambulatory Visit: Payer: Medicare Other | Admitting: Nurse Practitioner

## 2017-03-22 ENCOUNTER — Ambulatory Visit (INDEPENDENT_AMBULATORY_CARE_PROVIDER_SITE_OTHER): Payer: Medicare Other | Admitting: *Deleted

## 2017-03-22 DIAGNOSIS — I429 Cardiomyopathy, unspecified: Secondary | ICD-10-CM | POA: Diagnosis not present

## 2017-03-23 NOTE — Progress Notes (Signed)
Remote ICD transmission.   

## 2017-03-26 ENCOUNTER — Ambulatory Visit (INDEPENDENT_AMBULATORY_CARE_PROVIDER_SITE_OTHER): Payer: Medicare Other | Admitting: Nurse Practitioner

## 2017-03-26 ENCOUNTER — Encounter: Payer: Self-pay | Admitting: Nurse Practitioner

## 2017-03-26 VITALS — BP 158/80 | HR 61 | Temp 97.1°F | Ht 67.0 in | Wt 223.8 lb

## 2017-03-26 DIAGNOSIS — K219 Gastro-esophageal reflux disease without esophagitis: Secondary | ICD-10-CM | POA: Diagnosis not present

## 2017-03-26 DIAGNOSIS — K5900 Constipation, unspecified: Secondary | ICD-10-CM | POA: Diagnosis not present

## 2017-03-26 NOTE — Progress Notes (Signed)
Referring Provider: Sinda Du, MD Primary Care Physician:  Sinda Du, MD Primary GI:  Dr. Gala Romney  Chief Complaint  Patient presents with  . Gastroesophageal Reflux  . Constipation    Linzess didn't help    HPI:   [name] is a 76 y.o. female who presents For follow-up on GERD and constipation. The patient was last seen in our office for 20 11/03/2016 at which point she was doing well overall, and Linzess stopped working and was taking 72 g overt for a while but stopped. She would like to try an increased dose. GERD well-controlled on Prevacid twice a day. No other GI symptoms. She was given samples of 145 g of Linzess and request a progress report in 1-2 weeks. Continue PPI. Follow-up in 3 months.  Follow-up phone call 11/23/2016 indicated that the increased dose of Linzess was working well and she requested a prescription which was sent to her pharmacy.  Today she states Linzess initially worked but stopped after the first Rx and states it's too expensive to buy if it's not going to work. Has changed her diet to increase fiber which has helped, but doesn't have a good bowel movement except about once a week. Bilateral lower "side" pain described as dull, intermittent, improves with a bowel movement. Denies N/V, hematochezia, melena, fever, chills, unintentional weight loss. GERD is doing ok on Prevacid. Denies chest pain, dyspnea, dizziness, lightheadedness, syncope, near syncope. Denies any other upper or lower GI symptoms.  Past Medical History:  Diagnosis Date  . 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 Cerebrovascular accident [facility])    No clinical event; asymptomatic CT finding  . DDD (degenerative disc disease), cervical    Cervical  . Degenerative joint disease    Right hip and knee  . GERD (gastroesophageal reflux disease)   . Hip dislocation, right  ([place])   . Hyperlipemia   . Hypertension   . Hypothyroid   . LBBB (left bundle branch block)   . S/P colonoscopy 2009   Dr. Sharlett Iles: reportedly normal per pt  . S/P endoscopy 1980s   per pt; normal. performed secondary to reflux    Past Surgical History:  Procedure Laterality Date  . ABDOMINAL HYSTERECTOMY    . APPENDECTOMY    . BI-VENTRICULAR IMPLANTABLE CARDIOVERTER DEFIBRILLATOR  (CRT-D)  10-31-2013   MDT Hillery Aldo CRTD implanted by Dr Lovena Le  . BREAST BIOPSY     bilaterally; benign  . can not have MRI due to jaw surgery    . CHOLECYSTECTOMY    . COLONOSCOPY  2009  . ESOPHAGOGASTRODUODENOSCOPY  07/13/2011   small hh, patchy gastric erythema with scarring deformity of antrum, chronic gastritis, bx benign with no H.Pylori. Procedure: ESOPHAGOGASTRODUODENOSCOPY (EGD);  Surgeon: Daneil Dolin, MD;  [facility];  Service: Endoscopy;  Laterality: N/A;  10:45  . IMPLANTABLE CARDIOVERTER DEFIBRILLATOR IMPLANT N/A 10/31/2013   Procedure: IMPLANTABLE CARDIOVERTER DEFIBRILLATOR IMPLANT;  Surgeon: Evans Lance, MD;  Location: [facility] CATH LAB;  Service: Cardiovascular;  Laterality: N/A;  . MANDIBLE SURGERY     secondary to malocclusion  . THYROIDECTOMY     neoplasm  . Ureteral Surgery  1970    Current Outpatient Prescriptions  Medication Sig Dispense Refill  . benazepril (LOTENSIN) 40 MG tablet TAKE ONE TABLET BY MOUTH IN THE MORNING 90 tablet 3  . carvedilol (COREG) 25 MG tablet TAKE  ONE-HALF TABLET BY MOUTH IN THE MORNING AND ONE TABLET BY MOUTH IN THE EVENING/AT BEDTIME 60 tablet 3  . cholecalciferol (VITAMIN D) 1000 UNITS tablet Take 1,000 Units by mouth daily.    . fexofenadine (ALLEGRA) 180 MG tablet Take 180 mg by mouth every morning.     . fluticasone (FLONASE) 50 MCG/ACT nasal spray Place 2 sprays into the nose daily as needed for allergies.     . furosemide (LASIX) 40 MG tablet Take 1 tablet (40 mg total) by mouth 2 (two) times daily. Take As Directed 180 tablet 3    . lansoprazole (PREVACID) 15 MG capsule Take 30 mg by mouth 2 (two) times daily before a meal.     . levothyroxine (SYNTHROID, LEVOTHROID) 112 MCG tablet Take 112 mcg by mouth daily before breakfast.    . Multiple Vitamins-Minerals (CENTRUM SILVER PO) Take 1 tablet by mouth daily.     . traMADol (ULTRAM) 50 MG tablet Take 50 mg by mouth every 6 (six) hours as needed for moderate pain.     Marland Kitchen XARELTO 20 MG TABS tablet TAKE ONE TABLET BY MOUTH ONCE DAILY WITH SUPPER 90 tablet 1   No current facility-administered medications for this visit.     Allergies as of 03/26/2017 - Review Complete 03/26/2017  Allergen Reaction Noted  . Ciprofloxacin Cough 10/17/2013  . Dexlansoprazole  09/13/2011  . Statins Other (See Comments) 05/30/2012    Family History  Problem Relation Age of Onset  . Brain cancer Mother   . Atrial fibrillation Other   . Colon cancer Neg Hx     Social History   Social History  . Marital status: Widowed    Spouse name: N/A  . Number of children: 2  . Years of education: N/A   Social History Main Topics  . Smoking status: Never Smoker  . Smokeless tobacco: Never Used  . Alcohol use No  . Drug use: No  . Sexual activity: No   Other Topics Concern  . None   Social History Narrative   Widowed          Review of Systems: General: Negative for anorexia, weight loss, fever, chills, fatigue, weakness. ENT: Negative for hoarseness, difficulty swallowing. CV: Negative for chest pain, angina, palpitations, peripheral edema.  Respiratory: Negative for dyspnea at rest, cough, sputum, wheezing.  GI: See history of present illness. Endo: Negative for unusual weight change.  Heme: Negative for bruising or bleeding.  Physical Exam: BP (!) 158/80   Pulse 61   Temp (!) 97.1 F (36.2 C) (Oral)   Ht 5\' 7"  (1.702 m)   Wt 223 lb 12.8 oz (101.5 kg)   BMI 35.05 kg/m  General:   Alert and oriented. Pleasant and cooperative. Well-nourished and well-developed.  Eyes:   Without icterus, sclera clear and conjunctiva pink.  Ears:  Normal auditory acuity. Cardiovascular:  S1, S2 present without murmurs appreciated. Extremities without clubbing or edema. Respiratory:  Clear to auscultation bilaterally. No wheezes, rales, or rhonchi. No distress.  Gastrointestinal:  +BS, soft, and non-distended. No HSM noted. No guarding or rebound. No masses appreciated.  Rectal:  Deferred  Musculoskalatal:  Symmetrical without gross deformities. Neurologic:  Alert and oriented x4;  grossly normal neurologically. Psych:  Alert and cooperative. Normal mood and affect. Heme/Lymph/Immune: No excessive bruising noted.    03/26/2017 9:27 AM   Disclaimer: This note was dictated with voice recognition software. Similar sounding words can inadvertently be transcribed and may not be corrected upon review.

## 2017-03-26 NOTE — Progress Notes (Signed)
CC'D TO PCP °

## 2017-03-26 NOTE — Assessment & Plan Note (Signed)
Constipation which is been refractory to 2 doses of Linzess. She initially has a good result but then the medication stopped working. It is noted that she is on Ultram twice a day, occasionally 3 times a day if her neck pain is bad. At this point I'll try Movantik 25 mg as she may have an element of opioid-induced constipation. She has had some minimal improvement with increased fiber in her diet. She does have bilateral lower abdominal/side pains which improves after a bowel movement, likely due to constipation. I will give her samples to last 1-2 weeks, have her call back in one week and let us know if it is helping. Discussed stop taking Movantik if she stops taking her pain medicine. Return for follow-up in 2 months.

## 2017-03-26 NOTE — Patient Instructions (Signed)
1. Keep taking your acid blocker. 2. I am giving the samples of Movantik 25 mg. Take 1 pill, once a day. 3. Call us in 1 week and let us know if it is helping her constipation. 4. Return for follow-up in 2 months. 5. Call if you have any questions or concerns.

## 2017-03-26 NOTE — Assessment & Plan Note (Signed)
GERD relatively well controlled on PPI. Recommend she continue her PPI, return for follow-up in 2 months.

## 2017-03-27 ENCOUNTER — Encounter: Payer: Self-pay | Admitting: Cardiology

## 2017-04-11 ENCOUNTER — Ambulatory Visit (INDEPENDENT_AMBULATORY_CARE_PROVIDER_SITE_OTHER): Payer: Medicare Other | Admitting: Cardiovascular Disease

## 2017-04-11 ENCOUNTER — Encounter: Payer: Self-pay | Admitting: Cardiovascular Disease

## 2017-04-11 VITALS — BP 132/78 | HR 67 | Ht 67.0 in | Wt 222.8 lb

## 2017-04-11 DIAGNOSIS — I5022 Chronic systolic (congestive) heart failure: Secondary | ICD-10-CM | POA: Diagnosis not present

## 2017-04-11 DIAGNOSIS — I420 Dilated cardiomyopathy: Secondary | ICD-10-CM

## 2017-04-11 DIAGNOSIS — Z95810 Presence of automatic (implantable) cardiac defibrillator: Secondary | ICD-10-CM

## 2017-04-11 DIAGNOSIS — I480 Paroxysmal atrial fibrillation: Secondary | ICD-10-CM

## 2017-04-11 DIAGNOSIS — I10 Essential (primary) hypertension: Secondary | ICD-10-CM

## 2017-04-11 NOTE — Patient Instructions (Signed)
Medication Instructions:  Your physician recommends that you continue on your current medications as directed. Please refer to the Current Medication list given to you today.   Labwork: NONEO  Testing/Procedures: Your physician has requested that you have an echocardiogram. Echocardiography is a painless test that uses sound waves to create images of your heart. It provides your doctor with information about the size and shape of your heart and how well your heart's chambers and valves are working. This procedure takes approximately one hour. There are no restrictions for this procedure.    Follow-Up: Your physician wants you to follow-up in: 1 YEAR.  You will receive a reminder letter in the mail two months in advance. If you don't receive a letter, please call our office to schedule the follow-up appointment.   Any Other Special Instructions Will Be Listed Below (If Applicable).     If you need a refill on your cardiac medications before your next appointment, please call your pharmacy.

## 2017-04-11 NOTE — Addendum Note (Signed)
Addended byDebbora Lacrosse R on: 04/11/2017 10:20 AM   Modules accepted: Orders

## 2017-04-11 NOTE — Progress Notes (Signed)
SUBJECTIVE: The patient presents for routine follow-up. She has a biventricular ICD and follows up with EP. She has a nonischemic cardiopathy, atrial fibrillation, and hyperlipidemia. An echocardiogram in July 2014 revealed an ejection fraction of 30%.  ECG performed in the office today which I ordered and personally interpreted demonstrated a ventricular paced rhythm.  She is feeling well. She takes Lasix 40 mg twice daily. Leg swelling is more pronounced in the hot and humid weather. It improves when the weather turns cold. She seldom has palpitations. She denies chest pain. Her primary issues at present relate to cervical spine pain and left lateral neck pain.   Review of Systems: As per "subjective", otherwise negative.  Allergies  Allergen Reactions  . Ciprofloxacin Cough  . Dexlansoprazole     Lower abdominal pain.  . Statins Other (See Comments)    Arthralgias during treatment with at least 2 members of his class     Current Outpatient Prescriptions  Medication Sig Dispense Refill  . benazepril (LOTENSIN) 40 MG tablet TAKE ONE TABLET BY MOUTH IN THE MORNING 90 tablet 3  . carvedilol (COREG) 25 MG tablet TAKE ONE-HALF TABLET BY MOUTH IN THE MORNING AND ONE TABLET BY MOUTH IN THE EVENING/AT BEDTIME 60 tablet 3  . cholecalciferol (VITAMIN D) 1000 UNITS tablet Take 1,000 Units by mouth daily.    . fexofenadine (ALLEGRA) 180 MG tablet Take 180 mg by mouth every morning.     . fluticasone (FLONASE) 50 MCG/ACT nasal spray Place 2 sprays into the nose daily as needed for allergies.     . furosemide (LASIX) 40 MG tablet Take 1 tablet (40 mg total) by mouth 2 (two) times daily. Take As Directed 180 tablet 3  . lansoprazole (PREVACID) 15 MG capsule Take 30 mg by mouth 2 (two) times daily before a meal.     . levothyroxine (SYNTHROID, LEVOTHROID) 112 MCG tablet Take 112 mcg by mouth daily before breakfast.    . Multiple Vitamins-Minerals (CENTRUM SILVER PO) Take 1 tablet by mouth  daily.     . traMADol (ULTRAM) 50 MG tablet Take 50 mg by mouth every 6 (six) hours as needed for moderate pain.     Marland Kitchen XARELTO 20 MG TABS tablet TAKE ONE TABLET BY MOUTH ONCE DAILY WITH SUPPER 90 tablet 1   No current facility-administered medications for this visit.     Past Medical History:  Diagnosis Date  . 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 Cerebrovascular accident [facility])    No clinical event; asymptomatic CT finding  . DDD (degenerative disc disease), cervical    Cervical  . Degenerative joint disease    Right hip and knee  . GERD (gastroesophageal reflux disease)   . Hip dislocation, right ([place])   . Hyperlipemia   . Hypertension   . Hypothyroid   . LBBB (left bundle branch block)   . S/P colonoscopy 2009   Dr. Sharlett Iles: reportedly normal per pt  . S/P endoscopy 1980s   per pt; normal. performed secondary to reflux    Past Surgical History:  Procedure Laterality Date  . ABDOMINAL HYSTERECTOMY    . APPENDECTOMY    . BI-VENTRICULAR IMPLANTABLE CARDIOVERTER DEFIBRILLATOR  (CRT-D)  10-31-2013   MDT Hillery Aldo CRTD implanted by Dr Lovena Le  . BREAST BIOPSY     bilaterally; benign  . can not have MRI due to jaw  surgery    . CHOLECYSTECTOMY    . COLONOSCOPY  2009  . ESOPHAGOGASTRODUODENOSCOPY  07/13/2011   small hh, patchy gastric erythema with scarring deformity of antrum, chronic gastritis, bx benign with no H.Pylori. Procedure: ESOPHAGOGASTRODUODENOSCOPY (EGD);  Surgeon: Daneil Dolin, MD;  [facility];  Service: Endoscopy;  Laterality: N/A;  10:45  . IMPLANTABLE CARDIOVERTER DEFIBRILLATOR IMPLANT N/A 10/31/2013   Procedure: IMPLANTABLE CARDIOVERTER DEFIBRILLATOR IMPLANT;  Surgeon: Evans Lance, MD;  Location[facility] CATH LAB;  Service: Cardiovascular;  Laterality: N/A;  . MANDIBLE SURGERY     secondary to malocclusion  . THYROIDECTOMY     neoplasm    . Ureteral Surgery  1970    Social History   Social History  . Marital status: Widowed    Spouse name: N/A  . Number of children: 2  . Years of education: N/A   Occupational History  . Not on file.   Social History Main Topics  . Smoking status: Never Smoker  . Smokeless tobacco: Never Used  . Alcohol use No  . Drug use: No  . Sexual activity: No   Other Topics Concern  . Not on file   Social History Narrative   Widowed           Vitals:   04/11/17 1001  BP: 132/78  Pulse: 67  SpO2: 96%  Weight: 222 lb 12.8 oz (101.1 kg)  Height: 5\' 7"  (1.702 m)    Wt Readings from Last 3 Encounters:  04/11/17 222 lb 12.8 oz (101.1 kg)  03/26/17 223 lb 12.8 oz (101.5 kg)  12/21/16 237 lb (107.5 kg)     PHYSICAL EXAM General: NAD HEENT: Normal. Neck: No JVD, no thyromegaly. Lungs: Clear to auscultation bilaterally with normal respiratory effort. CV: Nondisplaced PMI.  Regular rate and rhythm, normal S1/split S2, no S3/S4, no murmur. No pretibial or periankle edema.  No carotid bruit.   Abdomen: Soft, nontender, no distention.  Neurologic: Alert and oriented.  Psych: Normal affect. Skin: Normal. Musculoskeletal: No gross deformities.    ECG: Most recent ECG reviewed.   Labs: Lab Results  Component Value Date/Time   K 4.2 04/18/2016 10:27 AM   BUN 14 04/18/2016 10:27 AM   CREATININE 1.14 (H) 04/18/2016 10:27 AM   ALT 15 01/29/2013 03:50 PM   TSH 2.925 02/23/2014 02:41 PM   HGB 13.3 02/23/2014 02:41 PM     Lipids: Lab Results  Component Value Date/Time   LDLCALC 158 10/04/2009   CHOL 219 10/04/2009   TRIG 95 10/04/2009   HDL 42 10/04/2009       ASSESSMENT AND PLAN:  1. Nonischemic cardiomyopathy/chronic systolic heart failure with BiV ICD: Euvolemic. I will obtain an echocardiogram to assess for interval improvement in LV systolic function. Continue current doses of benazepril, carvedilol, Lasix, and spironolactone.   2. Essential HTN:  Controlled. No changes.  3. Atrial fibrillation: Symptomatically stable. Anticoagulated with Xarelto. HR controlled on Coreg.  4. BiV ICD in situ: Stable with normal device function.Follows with Dr. Lovena Le.     Disposition: Follow up in 1 year with me. Follow up with Dr. Lovena Le as scheduled.   Kate Sable, M.D., F.A.C.C.

## 2017-04-12 ENCOUNTER — Ambulatory Visit (INDEPENDENT_AMBULATORY_CARE_PROVIDER_SITE_OTHER): Payer: Medicare Other | Admitting: Specialist

## 2017-04-12 ENCOUNTER — Encounter (INDEPENDENT_AMBULATORY_CARE_PROVIDER_SITE_OTHER): Payer: Self-pay | Admitting: Specialist

## 2017-04-12 VITALS — BP 152/74 | HR 58 | Ht 67.0 in | Wt 222.0 lb

## 2017-04-12 DIAGNOSIS — M47812 Spondylosis without myelopathy or radiculopathy, cervical region: Secondary | ICD-10-CM

## 2017-04-12 DIAGNOSIS — M542 Cervicalgia: Secondary | ICD-10-CM | POA: Diagnosis not present

## 2017-04-12 NOTE — Patient Instructions (Addendum)
Avoid overhead lifting and overhead use of the arms. Do not lift greater than 5 lbs. Adjust head rest in vehicle to prevent hyperextension if rear ended. Take extra precautions to avoid falling, including use of a cane if you feel weak. ES tylenol up to 4 times a day for 2 weeks  Then as needed. Moist heat and ice are sometimes helpful. Fall Prevention and Home Safety Falls cause injuries and can affect all age groups. It is possible to use preventive measures to significantly decrease the likelihood of falls. There are many simple measures which can make your home safer and prevent falls. OUTDOORS  Repair cracks and edges of walkways and driveways.  Remove high doorway thresholds.  Trim shrubbery on the main path into your home.  Have good outside lighting.  Clear walkways of tools, rocks, debris, and clutter.  Check that handrails are not broken and are securely fastened. Both sides of steps should have handrails.  Have leaves, snow, and ice cleared regularly.  Use sand or salt on walkways during winter months.  In the garage, clean up grease or oil spills. BATHROOM  Install night lights.  Install grab bars by the toilet and in the tub and shower.  Use non-skid mats or decals in the tub or shower.  Place a plastic non-slip stool in the shower to sit on, if needed.  Keep floors dry and clean up all water on the floor immediately.  Remove soap buildup in the tub or shower on a regular basis.  Secure bath mats with non-slip, double-sided rug tape.  Remove throw rugs and tripping hazards from the floors. BEDROOMS  Install night lights.  Make sure a bedside light is easy to reach.  Do not use oversized bedding.  Keep a telephone by your bedside.  Have a firm chair with side arms to use for getting dressed.  Remove throw rugs and tripping hazards from the floor. KITCHEN  Keep handles on pots and pans turned toward the center of the stove. Use back burners when  possible.  Clean up spills quickly and allow time for drying.  Avoid walking on wet floors.  Avoid hot utensils and knives.  Position shelves so they are not too high or low.  Place commonly used objects within easy reach.  If necessary, use a sturdy step stool with a grab bar when reaching.  Keep electrical cables out of the way.  Do not use floor polish or wax that makes floors slippery. If you must use wax, use non-skid floor wax.  Remove throw rugs and tripping hazards from the floor. STAIRWAYS  Never leave objects on stairs.  Place handrails on both sides of stairways and use them. Fix any loose handrails. Make sure handrails on both sides of the stairways are as long as the stairs.  Check carpeting to make sure it is firmly attached along stairs. Make repairs to worn or loose carpet promptly.  Avoid placing throw rugs at the top or bottom of stairways, or properly secure the rug with carpet tape to prevent slippage. Get rid of throw rugs, if possible.  Have an electrician put in a light switch at the top and bottom of the stairs. OTHER FALL PREVENTION TIPS  Wear low-heel or rubber-soled shoes that are supportive and fit well. Wear closed toe shoes.  When using a stepladder, make sure it is fully opened and both spreaders are firmly locked. Do not climb a closed stepladder.  Add color or contrast paint or tape  to grab bars and handrails in your home. Place contrasting color strips on first and last steps.  Learn and use mobility aids as needed. Install an electrical emergency response system.  Turn on lights to avoid dark areas. Replace light bulbs that burn out immediately. Get light switches that glow.  Arrange furniture to create clear pathways. Keep furniture in the same place.  Firmly attach carpet with non-skid or double-sided tape.  Eliminate uneven floor surfaces.  Select a carpet pattern that does not visually hide the edge of steps.  Be aware of all  pets. OTHER HOME SAFETY TIPS  Set the water temperature for 120 F (48.8 C).  Keep emergency numbers on or near the telephone.  Keep smoke detectors on every level of the home and near sleeping areas. Document Released: 06/23/2002 Document Revised: 01/02/2012 Document Reviewed: 09/22/2011 [facility] Patient Information 2014 [place].

## 2017-04-12 NOTE — Progress Notes (Signed)
Office Visit Note   Patient: [name]           Date of Birth: 09/27/1940           MRN: [id number] Visit Date: 04/12/2017              Requested by: Sinda Du, [place], [place] [address] PCP: Sinda Du, MD   Assessment & Plan: Visit Diagnoses:  1. Spondylosis without myelopathy or radiculopathy, cervical region   2. Cervicalgia     Plan: Avoid overhead lifting and overhead use of the arms. Do not lift greater than 5 lbs. Adjust head rest in vehicle to prevent hyperextension if rear ended. Take extra precautions to avoid falling, including use of a cane if you feel weak. ES tylenol up to 4 times a day for 2 weeks  Then as needed. Moist heat and ice are sometimes helpful. Fall Prevention and Home Safety Falls cause injuries and can affect all age groups. It is possible to use preventive measures to significantly decrease the likelihood of falls. There are many simple measures which can make your home safer and prevent falls. OUTDOORS  Repair cracks and edges of walkways and driveways.  Remove high doorway thresholds.  Trim shrubbery on the main path into your home.  Have good outside lighting.  Clear walkways of tools, rocks, debris, and clutter.  Check that handrails are not broken and are securely fastened. Both sides of steps should have handrails.  Have leaves, snow, and ice cleared regularly.  Use sand or salt on walkways during winter months.  In the garage, clean up grease or oil spills. BATHROOM  Install night lights.  Install grab bars by the toilet and in the tub and shower.  Use non-skid mats or decals in the tub or shower.  Place a plastic non-slip stool in the shower to sit on, if needed.  Keep floors dry and clean up all water on the floor immediately.  Remove soap buildup in the tub or shower on a regular basis.  Secure bath mats with non-slip, double-sided rug tape.  Remove throw rugs and  tripping hazards from the floors. BEDROOMS  Install night lights.  Make sure a bedside light is easy to reach.  Do not use oversized bedding.  Keep a telephone by your bedside.  Have a firm chair with side arms to use for getting dressed.  Remove throw rugs and tripping hazards from the floor. KITCHEN  Keep handles on pots and pans turned toward the center of the stove. Use back burners when possible.  Clean up spills quickly and allow time for drying.  Avoid walking on wet floors.  Avoid hot utensils and knives.  Position shelves so they are not too high or low.  Place commonly used objects within easy reach.  If necessary, use a sturdy step stool with a grab bar when reaching.  Keep electrical cables out of the way.  Do not use floor polish or wax that makes floors slippery. If you must use wax, use non-skid floor wax.  Remove throw rugs and tripping hazards from the floor. STAIRWAYS  Never leave objects on stairs.  Place handrails on both sides of stairways and use them. Fix any loose handrails. Make sure handrails on both sides of the stairways are as long as the stairs.  Check carpeting to make sure it is firmly attached along stairs. Make repairs to worn or loose carpet promptly.  Avoid placing  throw rugs at the top or bottom of stairways, or properly secure the rug with carpet tape to prevent slippage. Get rid of throw rugs, if possible.  Have an electrician put in a light switch at the top and bottom of the stairs. OTHER FALL PREVENTION TIPS  Wear low-heel or rubber-soled shoes that are supportive and fit well. Wear closed toe shoes.  When using a stepladder, make sure it is fully opened and both spreaders are firmly locked. Do not climb a closed stepladder.  Add color or contrast paint or tape to grab bars and handrails in your home. Place contrasting color strips on first and last steps.  Learn and use mobility aids as needed. Install an electrical  emergency response system.  Turn on lights to avoid dark areas. Replace light bulbs that burn out immediately. Get light switches that glow.  Arrange furniture to create clear pathways. Keep furniture in the same place.  Firmly attach carpet with non-skid or double-sided tape.  Eliminate uneven floor surfaces.  Select a carpet pattern that does not visually hide the edge of steps.  Be aware of all pets. OTHER HOME SAFETY TIPS  Set the water temperature for 120 F (48.8 C).  Keep emergency numbers on or near the telephone.  Keep smoke detectors on every level of the home and near sleeping areas. Document Released: 06/23/2002 Document Revised: 01/02/2012 Document Reviewed: 09/22/2011 [facility] Patient Information 2014 [place].   Follow-Up Instructions: Return in about 4 weeks (around 05/10/2017).   Orders:  No orders of the defined types were placed in this encounter.  No orders of the defined types were placed in this encounter.     Procedures: No procedures performed   Clinical Data: No additional findings.   Subjective: Chief Complaint  Patient presents with  . Neck - Pain  . Right Shoulder - Pain    76 year old female right hand dominant, with right neck pain with pain into the right shoulder and right side neck for the last 6 months. The pain in the neck has been chronic for years. She had a ministroke and was seen in the ER in the past, she has a heart arrythrmia and left bundle branch block, now with a defibrillator that has been in place for several years.  She is currently on xarelto and has been on this for 3 years. She has no numbness or tingling. She does not feel weak in the right arm. Has pain with turning her head side to side and driving. She has trouble reaching overhead and difficulty with lifting overhead trouble with blinds.     Review of Systems  Constitutional: Positive for activity change. Negative for appetite change, fatigue, fever  and unexpected weight change.  HENT: Positive for postnasal drip, rhinorrhea, sinus pain, sinus pressure and sneezing.   Eyes: Positive for visual disturbance. Negative for pain, discharge, redness and itching.  Respiratory: Positive for shortness of breath. Negative for apnea, chest tightness and wheezing.   Cardiovascular: Positive for leg swelling. Negative for chest pain and palpitations.  Gastrointestinal: Positive for constipation. Negative for abdominal distention, abdominal pain, anal bleeding, blood in stool, diarrhea, nausea, rectal pain and vomiting.  Endocrine: Negative for cold intolerance and heat intolerance.  Genitourinary: Negative.   Musculoskeletal: Positive for back pain, gait problem, neck pain and neck stiffness.  Skin: Negative for color change, pallor, rash and wound.  Neurological: Positive for headaches. Negative for dizziness, tremors, seizures, syncope, facial asymmetry, speech difficulty, weakness, light-headedness and  numbness.  Hematological: Negative for adenopathy. Does not bruise/bleed easily.  Psychiatric/Behavioral: Negative for agitation, behavioral problems, confusion, decreased concentration, dysphoric mood, hallucinations, self-injury, sleep disturbance and suicidal ideas. The patient is not nervous/anxious and is not hyperactive.      Objective: Vital Signs: BP (!) 152/74 (BP Location: Left Arm, Patient Position: Sitting)   Pulse (!) 58   Ht 5\' 7"  (1.702 m)   Wt 222 lb (100.7 kg)   BMI 34.77 kg/m   Physical Exam  Constitutional: She is oriented to person, place, and time. She appears well-developed and well-nourished.  HENT:  Head: Normocephalic and atraumatic.  Eyes: Pupils are equal, round, and reactive to light. EOM are normal. Right eye exhibits no discharge. Left eye exhibits no discharge. No scleral icterus.  Neck: Normal range of motion. Neck supple. No JVD present. No tracheal deviation present. No thyromegaly present.  Pulmonary/Chest:  Effort normal and breath sounds normal. No respiratory distress. She has no wheezes. She has no rales.  Abdominal: Soft. Bowel sounds are normal. She exhibits no distension and no mass. There is no tenderness. There is no guarding.  Neurological: She is alert and oriented to person, place, and time. She displays normal reflexes. No cranial nerve deficit. She exhibits normal muscle tone. Coordination normal.  Skin: Skin is warm and dry. No rash noted. No erythema. No pallor.  Psychiatric: She has a normal mood and affect. Her behavior is normal. Judgment and thought content normal.    Back Exam   Tenderness  The patient is experiencing tenderness in the cervical.  Range of Motion  Extension:  50 abnormal  Flexion:  50 abnormal  Lateral Bend Right:  50 abnormal  Lateral Bend Left: 50  Rotation Right: 50  Rotation Left: 50   Muscle Strength  Right Quadriceps:  5/5  Left Quadriceps:  5/5  Right Hamstrings:  5/5  Left Hamstrings:  5/5   Tests  Straight leg raise right: negative Straight leg raise left: negative  Reflexes  Patellar: normal Achilles: normal Biceps: normal Babinski's sign: normal   Other  Toe Walk: normal Heel Walk: normal Sensation: normal Gait: normal  Erythema: no back redness Scars: absent  Comments:  Hoffman's sign positive left arm, stiffness left hand finger IP joints. Motor intact       Specialty Comments:  No specialty comments available.  Imaging: No results found.   PMFS History: Patient Active Problem List   Diagnosis Date Noted  . 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 Atrial fibrillation ([place]) 11/17/2013  . Chronic systolic heart failure ([place]) 10/13/2013  . Hyponatremia 03/04/2013  . Constipation 09/13/2011  . GERD 09/13/2010  . HYPOTHYROIDISM 04/28/2009  . HYPERLIPIDEMIA 04/28/2009  . Hypertension 04/28/2009  . CARDIOMYOPATHY 04/28/2009  . LEFT BUNDLE BRANCH BLOCK 04/28/2009  .  DEGENERATIVE JOINT DISEASE 04/28/2009   Past Medical History:  Diagnosis Date  . 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 Cerebrovascular accident [facility])    No clinical event; asymptomatic CT finding  . DDD (degenerative disc disease), cervical    Cervical  . Degenerative joint disease    Right hip and knee  . GERD (gastroesophageal reflux disease)   . Hip dislocation, right ([place])   . Hyperlipemia   . Hypertension   . Hypothyroid   . LBBB (left bundle branch block)   . S/P colonoscopy 2009   Dr. Sharlett Iles: reportedly normal  per pt  . S/P endoscopy 1980s   per pt; normal. performed secondary to reflux    Family History  Problem Relation Age of Onset  . Brain cancer Mother   . Atrial fibrillation Other   . Colon cancer Neg Hx     Past Surgical History:  Procedure Laterality Date  . ABDOMINAL HYSTERECTOMY    . APPENDECTOMY    . BI-VENTRICULAR IMPLANTABLE CARDIOVERTER DEFIBRILLATOR  (CRT-D)  10-31-2013   MDT Hillery Aldo CRTD implanted by Dr Lovena Le  . BREAST BIOPSY     bilaterally; benign  . can not have MRI due to jaw surgery    . CHOLECYSTECTOMY    . COLONOSCOPY  2009  . ESOPHAGOGASTRODUODENOSCOPY  07/13/2011   small hh, patchy gastric erythema with scarring deformity of antrum, chronic gastritis, bx benign with no H.Pylori. Procedure: ESOPHAGOGASTRODUODENOSCOPY (EGD);  Surgeon: Daneil Dolin, MD;  [facility];  Service: Endoscopy;  Laterality: N/A;  10:45  . IMPLANTABLE CARDIOVERTER DEFIBRILLATOR IMPLANT N/A 10/31/2013   Procedure: IMPLANTABLE CARDIOVERTER DEFIBRILLATOR IMPLANT;  Surgeon: Evans Lance, MD;  Location: [facility] CATH LAB;  Service: Cardiovascular;  Laterality: N/A;  . MANDIBLE SURGERY     secondary to malocclusion  . THYROIDECTOMY     neoplasm  . Ureteral Surgery  1970   Social History   Occupational History  . Not on file.   Social History  Main Topics  . Smoking status: Never Smoker  . Smokeless tobacco: Never Used  . Alcohol use No  . Drug use: No  . Sexual activity: No

## 2017-04-16 ENCOUNTER — Other Ambulatory Visit (HOSPITAL_COMMUNITY): Payer: Medicare Other

## 2017-04-17 LAB — CUP PACEART REMOTE DEVICE CHECK
Battery Voltage: 2.96 V
Brady Statistic AP VP Percent: 6.09 %
Brady Statistic RV Percent Paced: 94.06 %
HighPow Impedance: 79 Ohm
Implantable Lead Implant Date: 20150417
Implantable Lead Location: 753859
Implantable Lead Location: 753860
Implantable Lead Model: 4298
Implantable Lead Model: 5076
Implantable Lead Model: 6935
Lead Channel Impedance Value: 342 Ohm
Lead Channel Impedance Value: 361 Ohm
Lead Channel Impedance Value: 361 Ohm
Lead Channel Impedance Value: 399 Ohm
Lead Channel Impedance Value: 608 Ohm
Lead Channel Impedance Value: 608 Ohm
Lead Channel Pacing Threshold Amplitude: 0.5 V
Lead Channel Sensing Intrinsic Amplitude: 10.125 mV
Lead Channel Sensing Intrinsic Amplitude: 2.125 mV
Lead Channel Sensing Intrinsic Amplitude: 2.125 mV
Lead Channel Setting Pacing Amplitude: 2 V
Lead Channel Setting Pacing Amplitude: 2.25 V
Lead Channel Setting Pacing Pulse Width: 0.4 ms
Lead Channel Setting Sensing Sensitivity: 0.3 mV
MDC IDC LEAD IMPLANT DT: 20150417
MDC IDC LEAD IMPLANT DT: 20150417
MDC IDC LEAD LOCATION: 753858
MDC IDC MSMT BATTERY REMAINING LONGEVITY: 45 mo
MDC IDC MSMT LEADCHNL LV IMPEDANCE VALUE: 342 Ohm
MDC IDC MSMT LEADCHNL LV IMPEDANCE VALUE: 361 Ohm
MDC IDC MSMT LEADCHNL LV IMPEDANCE VALUE: 551 Ohm
MDC IDC MSMT LEADCHNL LV IMPEDANCE VALUE: 608 Ohm
MDC IDC MSMT LEADCHNL LV IMPEDANCE VALUE: 646 Ohm
MDC IDC MSMT LEADCHNL LV PACING THRESHOLD AMPLITUDE: 1.25 V
MDC IDC MSMT LEADCHNL LV PACING THRESHOLD PULSEWIDTH: 0.4 ms
MDC IDC MSMT LEADCHNL RA IMPEDANCE VALUE: 399 Ohm
MDC IDC MSMT LEADCHNL RA PACING THRESHOLD PULSEWIDTH: 0.4 ms
MDC IDC MSMT LEADCHNL RV IMPEDANCE VALUE: 342 Ohm
MDC IDC MSMT LEADCHNL RV PACING THRESHOLD AMPLITUDE: 0.75 V
MDC IDC MSMT LEADCHNL RV PACING THRESHOLD PULSEWIDTH: 0.4 ms
MDC IDC MSMT LEADCHNL RV SENSING INTR AMPL: 10.125 mV
MDC IDC PG IMPLANT DT: 20150417
MDC IDC SESS DTM: 20180906084223
MDC IDC SET LEADCHNL LV PACING PULSEWIDTH: 0.4 ms
MDC IDC SET LEADCHNL RV PACING AMPLITUDE: 2.5 V
MDC IDC STAT BRADY AP VS PERCENT: 0.02 %
MDC IDC STAT BRADY AS VP PERCENT: 91.54 %
MDC IDC STAT BRADY AS VS PERCENT: 2.36 %
MDC IDC STAT BRADY RA PERCENT PACED: 5.94 %

## 2017-04-18 ENCOUNTER — Ambulatory Visit (HOSPITAL_COMMUNITY)
Admission: RE | Admit: 2017-04-18 | Discharge: 2017-04-18 | Disposition: A | Discharge status: Home / Self Care | Payer: Medicare Other | Source: Ambulatory Visit | Attending: Cardiovascular Disease | Admitting: Cardiovascular Disease

## 2017-04-18 DIAGNOSIS — I5030 Unspecified diastolic (congestive) heart failure: Secondary | ICD-10-CM | POA: Diagnosis not present

## 2017-04-18 DIAGNOSIS — I071 Rheumatic tricuspid insufficiency: Secondary | ICD-10-CM | POA: Insufficient documentation

## 2017-04-18 DIAGNOSIS — I420 Dilated cardiomyopathy: Secondary | ICD-10-CM | POA: Diagnosis not present

## 2017-04-18 LAB — ECHOCARDIOGRAM COMPLETE
CHL CUP DOP CALC LVOT VTI: 23.4 cm
E decel time: 398 msec
EERAT: 19.84
FS: 21 % — AB (ref 28–44)
IV/PV OW: 1.08
LA diam index: 1.26 cm/m2
LA vol A4C: 64.3 ml
LA vol index: 35.2 mL/m2
LA vol: 78.1 mL
LASIZE: 28 mm
LEFT ATRIUM END SYS DIAM: 28 mm
LV TDI E'LATERAL: 4.35
LV e' LATERAL: 4.35 cm/s
LV sys vol: 46 mL — AB (ref 14–42)
LVEEAVG: 19.84
LVEEMED: 19.84
LVOT area: 2.54 cm2
LVOT diameter: 18 mm
LVOT peak grad rest: 3 mmHg
LVOTPV: 89.5 cm/s
LVOTSV: 59 mL
LVSYSVOLIN: 21 mL/m2
MV Dec: 398
MV pk A vel: 139 m/s
MV pk E vel: 86.3 m/s
MVPG: 3 mmHg
PW: 11.2 mm — AB (ref 0.6–1.1)
RV LATERAL S' VELOCITY: 11.3 cm/s
RV TAPSE: 22.6 mm
RV sys press: 25 mmHg
Reg peak vel: 234 cm/s
TDI e' medial: 5.66
TR max vel: 234 cm/s

## 2017-04-18 NOTE — Progress Notes (Signed)
*  PRELIMINARY RESULTS* Echocardiogram 2D Echocardiogram has been performed.  Samuel Germany 04/18/2017, 9:37 AM

## 2017-04-23 ENCOUNTER — Encounter: Payer: Self-pay | Admitting: Physical Therapy

## 2017-04-23 ENCOUNTER — Ambulatory Visit: Payer: Medicare Other | Attending: Specialist | Admitting: Physical Therapy

## 2017-04-23 DIAGNOSIS — M62838 Other muscle spasm: Secondary | ICD-10-CM

## 2017-04-23 DIAGNOSIS — M542 Cervicalgia: Secondary | ICD-10-CM

## 2017-04-24 NOTE — Therapy (Signed)
[place], [name], [address] Phone: [phone]   Fax:  [phone]  Physical Therapy Evaluation  Patient Details  Name: [name] MRN: [id number] Date of Birth: [birth year] Referring Provider: Dr Basil Dess  Encounter Date: 04/23/2017      PT End of Session - 04/24/17 0920    Visit Number 1   Number of Visits 16   Date for PT Re-Evaluation 06/19/17   Authorization Type Medicare traid    PT Start Time 1148   PT Stop Time 1234   PT Time Calculation (min) 46 min   Activity Tolerance Patient tolerated treatment well   Behavior During Therapy [facility] for tasks assessed/performed      Past Medical History:  Diagnosis Date  . 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 Cerebrovascular accident [facility])    No clinical event; asymptomatic CT finding  . DDD (degenerative disc disease), cervical    Cervical  . Degenerative joint disease    Right hip and knee  . GERD (gastroesophageal reflux disease)   . Hip dislocation, right ([place])   . Hyperlipemia   . Hypertension   . Hypothyroid   . LBBB (left bundle branch block)   . S/P colonoscopy 2009   Dr. Sharlett Iles: reportedly normal per pt  . S/P endoscopy 1980s   per pt; normal. performed secondary to reflux    Past Surgical History:  Procedure Laterality Date  . ABDOMINAL HYSTERECTOMY    . APPENDECTOMY    . BI-VENTRICULAR IMPLANTABLE CARDIOVERTER DEFIBRILLATOR  (CRT-D)  10-31-2013   MDT Hillery Aldo CRTD implanted by Dr Lovena Le  . BREAST BIOPSY     bilaterally; benign  . can not have MRI due to jaw surgery    . CHOLECYSTECTOMY    . COLONOSCOPY  2009  . ESOPHAGOGASTRODUODENOSCOPY  07/13/2011   small hh, patchy gastric erythema with scarring deformity of antrum, chronic gastritis, bx benign with no H.Pylori. Procedure: ESOPHAGOGASTRODUODENOSCOPY (EGD);   Surgeon: Daneil Dolin, MD;  Location[facility];  Service: Endoscopy;  Laterality: N/A;  10:45  . IMPLANTABLE CARDIOVERTER DEFIBRILLATOR IMPLANT N/A 10/31/2013   Procedure: IMPLANTABLE CARDIOVERTER DEFIBRILLATOR IMPLANT;  Surgeon: Evans Lance, MD;  Location: [facility] CATH LAB;  Service: Cardiovascular;  Laterality: N/A;  . MANDIBLE SURGERY     secondary to malocclusion  . THYROIDECTOMY     neoplasm  . Ureteral Surgery  1970    There were no vitals filed for this visit.       Subjective Assessment - 04/23/17 1159    Subjective Patient had an incidious onset of cervicalspine pain 7 months ago. Most of her pain is on the lef. She She has no pain shooting down her arm.    Pertinent History Pacemaker, Multi joint arthritis   Limitations Standing;Walking   How long can you sit comfortably? No limit    How long can you stand comfortably? No limit    How long can you walk comfortably? No limit    Diagnostic tests Cervical X-ray: Mild multi joint degeneration    Patient Stated Goals To have less pain in her neck    Currently in Pain? Yes  Patient has chronic pain in her hip   Pain Score 7    Pain Location Back   Pain Orientation Left;Upper   Pain Descriptors / Indicators Aching   Pain Type Acute pain  Pain Onset More than a month ago   Pain Frequency Constant   Aggravating Factors  turning her head; looking up and down    Pain Relieving Factors rest    Effect of Pain on Daily Activities diffciulty driving    Multiple Pain Sites Yes            [facility] PT Assessment - 04/24/17 0001      Assessment   Medical Diagnosis Cervical spine pain    Referring Provider Dr Basil Dess   Onset Date/Surgical Date --  7 months prior    Hand Dominance Right   Next MD Visit None scheduled    Prior Therapy None      Precautions   Precautions ICD/Pacemaker   Precaution Comments Blood thinners      Restrictions   Weight Bearing Restrictions No     Balance Screen   Has the patient  fallen in the past 6 months No   Has the patient had a decrease in activity level because of a fear of falling?  No   Is the patient reluctant to leave their home because of a fear of falling?  No     Home Environment   Additional Comments Nothing pertinant to the neck      Prior Function   Level of Independence Independent   Vocation Retired   Leisure Research officer, political party   Overall Cognitive Status Within Functional Limits for tasks assessed   Attention Focused   Focused Attention Appears intact   Memory Appears intact   Awareness Appears intact   Problem Solving Appears intact     Sensation   Light Touch Appears Intact   Additional Comments Negative for radiating pain      Coordination   Gross Motor Movements are Fluid and Coordinated Yes   Fine Motor Movements are Fluid and Coordinated Yes     AROM   Cervical Extension 15   Cervical - Right Side Bend pain    Cervical - Left Side Bend decreased pain    Cervical - Right Rotation 30    Cervical - Left Rotation 35      PROM   Overall PROM Comments full shoulder ROM but pain with end range shoulder flexion      Strength   Right Shoulder Flexion 5/5   Right Shoulder Internal Rotation 4+/5   Right Shoulder External Rotation 4+/5     Palpation   Palpation comment significant spasming of the bilateral upper traps     Ambulation/Gait   Gait Comments decreased left hip flexion; decreased single leg stance time on the left             Objective measurements completed on examination: See above findings.          [facility] Adult PT Treatment/Exercise - 04/24/17 0001      Neck Exercises: Seated   Other Seated Exercise scap retraction 2x10; cervical rotation 3x; reviewed how to use a thera-cane;      Neck Exercises: Stretches   Upper Trapezius Stretch Limitations 2x10 seconds to the right                PT Education - 04/24/17 0902    Education provided Yes   Education Details reviewed importance of  paosture, rationale of manual therapy; HEP; symptom mangement    Person(s) Educated Patient   Methods Explanation;Tactile cues;Verbal cues;Demonstration   Comprehension Verbalized understanding;Returned demonstration;Verbal cues required;Tactile cues required  PT Short Term Goals - 04/24/17 0835      PT SHORT TERM GOAL #1   Title Patient will increase builateral cervical rotation by 25 degrees    Time 4   Period Weeks   Status New   Target Date 05/22/17     PT SHORT TERM GOAL #2   Title Patient will demsotrate full pain free cervical flexion    Time 4   Period Weeks   Status New   Target Date 05/22/17     PT SHORT TERM GOAL #3   Title Patient will demsogtrate 5/5 gross eright shoulder flexion    Time 4   Period Weeks   Status New   Target Date 05/22/17     PT SHORT TERM GOAL #4   Title Patient will report decreased tenderness to palpation in her right upper trap    Time 4   Period Weeks   Status New   Target Date 05/22/17           PT Long Term Goals - 04/24/17 0837      PT LONG TERM GOAL #1   Title Patient will be independent with exercise program to reduce spasming and inmprove posture   Time 8   Period Weeks   Status New   Target Date 06/19/17     PT LONG TERM GOAL #2   Title Patient will report no ram horn headaches for 1 week   Time 8   Period Weeks   Status New     PT LONG TERM GOAL #3   Title Patient wilkl demsotrate 60 degrees of rotation bilateral without pain in order to drive safely    Time 8   Period Weeks   Status New   Target Date 06/19/17                Plan - 04/24/17 1110    Clinical Impression Statement Patient is a 76 year old female who presents with right sided cervical pain which radiates up into her tomporal area. She has significant limitations in range and minor defcits in right shoulder strength. She has increased pain when she turns her head and looks up. She has postural deficits.    History and Personal  Factors relevant to plan of care: pace maker; pain from her pacemaker, can not breath when she lies down; end stage left hip arthritis    Clinical Presentation Evolving   Clinical Decision Making Moderate   Rehab Potential Good   PT Frequency 2x / week   PT Duration 8 weeks   PT Treatment/Interventions ADLs/Self Care Home Management;Cryotherapy;Electrical Stimulation;Iontophoresis 4mg /ml Dexamethasone;Therapeutic activities;Therapeutic exercise;Neuromuscular re-education;Patient/family education;Manual techniques;Splinting;Taping;Passive range of motion;Moist Heat;Traction;Ultrasound;Dry needling   PT Next Visit Plan do manual work on wedge or sitting, light postrula exercises, when she sits in good postrue she gets pain from her pace maker; consdier light pec release; decompresion position as much as possble on the wedge; Continue manual therapy to upper traps and cervical paraspianls; review scpa retraction.    PT Home Exercise Plan scap retraction; cervical rotation; upper trap stretch   Consulted and Agree with Plan of Care Patient      Patient will benefit from skilled therapeutic intervention in order to improve the following deficits and impairments:  Abnormal gait, Pain, Increased edema, Decreased strength, Impaired UE functional use, Decreased endurance, Decreased activity tolerance, Decreased range of motion, Postural dysfunction  Visit Diagnosis: Cervicalgia - Plan: PT plan of care cert/re-cert  Other muscle spasm - Plan:  PT plan of care cert/re-cert      G-Codes - 91/47/82 0858    Functional Assessment Tool Used (Outpatient Only) FOTO, clinical decision making    Functional Limitation Changing and maintaining body position   Changing and Maintaining Body Position Current Status (N5621) At least 40 percent but less than 60 percent impaired, limited or restricted   Changing and Maintaining Body Position Goal Status (H0865) At least 20 percent but less than 40 percent impaired,  limited or restricted       Problem List Patient Active Problem List   Diagnosis Date Noted  . 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 Atrial fibrillation ([place]) 11/17/2013  . Chronic systolic heart failure ([place]) 10/13/2013  . Hyponatremia 03/04/2013  . Constipation 09/13/2011  . GERD 09/13/2010  . HYPOTHYROIDISM 04/28/2009  . HYPERLIPIDEMIA 04/28/2009  . Hypertension 04/28/2009  . CARDIOMYOPATHY 04/28/2009  . LEFT BUNDLE BRANCH BLOCK 04/28/2009  . DEGENERATIVE JOINT DISEASE 04/28/2009    Carney Living PT DPT  04/24/2017, 3:28 PM  [facility] [address] [place], [name], [address] Phone: [phone]   Fax:  [phone]  Name: ANTONYA LEEDER MRN: [id number] Date of Birth: Apr 14, 1941

## 2017-04-25 DIAGNOSIS — I4891 Unspecified atrial fibrillation: Secondary | ICD-10-CM | POA: Diagnosis not present

## 2017-04-25 DIAGNOSIS — I429 Cardiomyopathy, unspecified: Secondary | ICD-10-CM | POA: Diagnosis not present

## 2017-04-25 DIAGNOSIS — I5022 Chronic systolic (congestive) heart failure: Secondary | ICD-10-CM | POA: Diagnosis not present

## 2017-04-25 DIAGNOSIS — Z23 Encounter for immunization: Secondary | ICD-10-CM | POA: Diagnosis not present

## 2017-04-25 DIAGNOSIS — I10 Essential (primary) hypertension: Secondary | ICD-10-CM | POA: Diagnosis not present

## 2017-04-27 ENCOUNTER — Ambulatory Visit: Payer: Medicare Other | Admitting: Physical Therapy

## 2017-05-01 ENCOUNTER — Ambulatory Visit: Payer: Medicare Other | Admitting: Physical Therapy

## 2017-05-01 DIAGNOSIS — M542 Cervicalgia: Secondary | ICD-10-CM | POA: Diagnosis not present

## 2017-05-01 DIAGNOSIS — M62838 Other muscle spasm: Secondary | ICD-10-CM

## 2017-05-01 NOTE — Therapy (Signed)
[place], [name], [address] Phone: [phone]   Fax:  [phone]  Physical Therapy Treatment  Patient Details  Name: [name] MRN: [id number] Date of Birth: 22-Feb-1941 Referring Provider: Dr Basil Dess  Encounter Date: 05/01/2017       PT End of Session - 05/01/17 1225    Visit Number 2   Number of Visits 16   Date for PT Re-Evaluation 06/19/17   Authorization Type Medicare traid    PT Start Time 6962   PT Stop Time 1059   PT Time Calculation (min) 44 min   Activity Tolerance Patient tolerated treatment well   Behavior During Therapy [facility] for tasks assessed/performed      Past Medical History:  Diagnosis Date  . 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 Cerebrovascular accident [facility])    No clinical event; asymptomatic CT finding  . DDD (degenerative disc disease), cervical    Cervical  . Degenerative joint disease    Right hip and knee  . GERD (gastroesophageal reflux disease)   . Hip dislocation, right ([place])   . Hyperlipemia   . Hypertension   . Hypothyroid   . LBBB (left bundle branch block)   . S/P colonoscopy 2009   Dr. Sharlett Iles: reportedly normal per pt  . S/P endoscopy 1980s   per pt; normal. performed secondary to reflux    Past Surgical History:  Procedure Laterality Date  . ABDOMINAL HYSTERECTOMY    . APPENDECTOMY    . BI-VENTRICULAR IMPLANTABLE CARDIOVERTER DEFIBRILLATOR  (CRT-D)  10-31-2013   MDT Hillery Aldo CRTD implanted by Dr Lovena Le  . BREAST BIOPSY     bilaterally; benign  . can not have MRI due to jaw surgery    . CHOLECYSTECTOMY    . COLONOSCOPY  2009  . ESOPHAGOGASTRODUODENOSCOPY  07/13/2011   small hh, patchy gastric erythema with scarring deformity of antrum, chronic gastritis, bx benign with no H.Pylori. Procedure: ESOPHAGOGASTRODUODENOSCOPY (EGD);   Surgeon: Daneil Dolin, MD;  [facility];  Service: Endoscopy;  Laterality: N/A;  10:45  . IMPLANTABLE CARDIOVERTER DEFIBRILLATOR IMPLANT N/A 10/31/2013   Procedure: IMPLANTABLE CARDIOVERTER DEFIBRILLATOR IMPLANT;  Surgeon: Evans Lance, MD;  Location: [facility] CATH LAB;  Service: Cardiovascular;  Laterality: N/A;  . MANDIBLE SURGERY     secondary to malocclusion  . THYROIDECTOMY     neoplasm  . Ureteral Surgery  1970    There were no vitals filed for this visit.      Subjective Assessment - 05/01/17 1044    Subjective Patient reports improved pain after the first visit but she had to miss Fridays visit and she has tightend back up. She has been working on her exercises at home. She feels like it may be helping loosen the front.    Diagnostic tests Cervical X-ray: Mild multi joint degeneration    Patient Stated Goals To have less pain in her neck    Currently in Pain? Yes   Pain Score 3    Pain Location Neck   Pain Orientation Left;Upper   Pain Descriptors / Indicators Aching   Pain Onset More than a month ago   Pain Frequency Constant   Aggravating Factors  turning head; looking up and down    Pain Relieving Factors rest    Effect of Pain on Daily Activities difficulty driving  [facility] PT Assessment - 05/01/17 0001      AROM   Cervical - Right Rotation 38   Cervical - Left Rotation 44                     [facility] Adult PT Treatment/Exercise - 05/01/17 0001      Neck Exercises: Seated   Other Seated Exercise scap retraction 2x5; cervical rotation 3x; reviewed how to use a thera-cane;      Neck Exercises: Supine   Other Supine Exercise wand flexion on wedge 2x5      Manual Therapy   Manual therapy comments genelt sub-occipital release on wedge; gentl manual ttreaction on wedge. Seated IASTYM to bilateral upper traps,      Neck Exercises: Stretches   Upper Trapezius Stretch Limitations 2x10 seconds to the right   Levator Stretch 2 reps;10  seconds                PT Education - 05/01/17 1224    Education provided Yes   Education Details updated HEP; reviewed improtance of proper posture    Person(s) Educated Patient   Methods Explanation;Demonstration;Tactile cues;Verbal cues   Comprehension Verbalized understanding;Returned demonstration;Verbal cues required;Tactile cues required          PT Short Term Goals - 04/24/17 0835      PT SHORT TERM GOAL #1   Title Patient will increase builateral cervical rotation by 25 degrees    Time 4   Period Weeks   Status New   Target Date 05/22/17     PT SHORT TERM GOAL #2   Title Patient will demsotrate full pain free cervical flexion    Time 4   Period Weeks   Status New   Target Date 05/22/17     PT SHORT TERM GOAL #3   Title Patient will demsogtrate 5/5 gross eright shoulder flexion    Time 4   Period Weeks   Status New   Target Date 05/22/17     PT SHORT TERM GOAL #4   Title Patient will report decreased tenderness to palpation in her right upper trap    Time 4   Period Weeks   Status New   Target Date 05/22/17           PT Long Term Goals - 04/24/17 0837      PT LONG TERM GOAL #1   Title Patient will be independent with exercise program to reduce spasming and inmprove posture   Time 8   Period Weeks   Status New   Target Date 06/19/17     PT LONG TERM GOAL #2   Title Patient will report no ram horn headaches for 1 week   Time 8   Period Weeks   Status New     PT LONG TERM GOAL #3   Title Patient wilkl demsotrate 60 degrees of rotation bilateral without pain in order to drive safely    Time 8   Period Weeks   Status New   Target Date 06/19/17               Plan - 05/01/17 1225    Clinical Impression Statement Patient rotation motion improved bilateral. She continues to have spasming on the right and now has spasming on the left althought he right is better. She was trimming bushes yesterday.    Clinical Presentation  Evolving   Clinical Decision Making Moderate   Rehab Potential Good   PT Frequency 2x /  week   PT Duration 8 weeks   PT Treatment/Interventions ADLs/Self Care Home Management;Cryotherapy;Electrical Stimulation;Iontophoresis 4mg /ml Dexamethasone;Therapeutic activities;Therapeutic exercise;Neuromuscular re-education;Patient/family education;Manual techniques;Splinting;Taping;Passive range of motion;Moist Heat;Traction;Ultrasound;Dry needling   PT Next Visit Plan do manual work on wedge or sitting, light postrula exercises, when she sits in good postrue she gets pain from her pace maker; consdier light pec release; decompresion position as much as possble on the wedge; Continue manual therapy to upper traps and cervical paraspianls; review scpa retraction.    PT Home Exercise Plan scap retraction; cervical rotation; upper trap stretch   Consulted and Agree with Plan of Care Patient      Patient will benefit from skilled therapeutic intervention in order to improve the following deficits and impairments:  Abnormal gait, Pain, Increased edema, Decreased strength, Impaired UE functional use, Decreased endurance, Decreased activity tolerance, Decreased range of motion, Postural dysfunction  Visit Diagnosis: Cervicalgia  Other muscle spasm     Problem List Patient Active Problem List   Diagnosis Date Noted  . 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 Atrial fibrillation ([place]) 11/17/2013  . Chronic systolic heart failure ([place]) 10/13/2013  . Hyponatremia 03/04/2013  . Constipation 09/13/2011  . GERD 09/13/2010  . HYPOTHYROIDISM 04/28/2009  . HYPERLIPIDEMIA 04/28/2009  . Hypertension 04/28/2009  . CARDIOMYOPATHY 04/28/2009  . LEFT BUNDLE BRANCH BLOCK 04/28/2009  . DEGENERATIVE JOINT DISEASE 04/28/2009    Carney Living PT DPT  05/01/2017, 12:28 PM  [facility] [address] [place], [name],  [address] Phone: [phone]   Fax:  [phone]  Name: SIONA COULSTON MRN: [id number] Date of Birth: [birth year]

## 2017-05-04 ENCOUNTER — Encounter: Payer: Self-pay | Admitting: Physical Therapy

## 2017-05-04 ENCOUNTER — Ambulatory Visit: Payer: Medicare Other | Admitting: Physical Therapy

## 2017-05-04 DIAGNOSIS — M542 Cervicalgia: Secondary | ICD-10-CM | POA: Diagnosis not present

## 2017-05-04 DIAGNOSIS — M62838 Other muscle spasm: Secondary | ICD-10-CM

## 2017-05-04 NOTE — Therapy (Signed)
[name], [name], [address] Phone: [phone]   Fax:  [phone]  Physical Therapy Treatment  Patient Details  Name: [name] MRN: [id number] Date of Birth: 1940/12/28 Referring Provider: Dr Basil Dess  Encounter Date: 05/04/2017      PT End of Session - 05/04/17 1248    Visit Number 3   Number of Visits 16   Date for PT Re-Evaluation 06/19/17   PT Start Time 7893   PT Stop Time 1055   PT Time Calculation (min) 40 min   Activity Tolerance Patient tolerated treatment well   Behavior During Therapy [facility] for tasks assessed/performed      Past Medical History:  Diagnosis Date  . 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 Cerebrovascular accident [facility])    No clinical event; asymptomatic CT finding  . DDD (degenerative disc disease), cervical    Cervical  . Degenerative joint disease    Right hip and knee  . GERD (gastroesophageal reflux disease)   . Hip dislocation, right ([place])   . Hyperlipemia   . Hypertension   . Hypothyroid   . LBBB (left bundle branch block)   . S/P colonoscopy 2009   Dr. Sharlett Iles: reportedly normal per pt  . S/P endoscopy 1980s   per pt; normal. performed secondary to reflux    Past Surgical History:  Procedure Laterality Date  . ABDOMINAL HYSTERECTOMY    . APPENDECTOMY    . BI-VENTRICULAR IMPLANTABLE CARDIOVERTER DEFIBRILLATOR  (CRT-D)  10-31-2013   MDT Hillery Aldo CRTD implanted by Dr Lovena Le  . BREAST BIOPSY     bilaterally; benign  . can not have MRI due to jaw surgery    . CHOLECYSTECTOMY    . COLONOSCOPY  2009  . ESOPHAGOGASTRODUODENOSCOPY  07/13/2011   small hh, patchy gastric erythema with scarring deformity of antrum, chronic gastritis, bx benign with no H.Pylori. Procedure: ESOPHAGOGASTRODUODENOSCOPY (EGD);  Surgeon: Daneil Dolin, MD;   [facility];  Service: Endoscopy;  Laterality: N/A;  10:45  . IMPLANTABLE CARDIOVERTER DEFIBRILLATOR IMPLANT N/A 10/31/2013   Procedure: IMPLANTABLE CARDIOVERTER DEFIBRILLATOR IMPLANT;  Surgeon: Evans Lance, MD;  Location: [facility] CATH LAB;  Service: Cardiovascular;  Laterality: N/A;  . MANDIBLE SURGERY     secondary to malocclusion  . THYROIDECTOMY     neoplasm  . Ureteral Surgery  1970    There were no vitals filed for this visit.      Subjective Assessment - 05/04/17 1017    Subjective Patient reports her pain started increasing last night. She feels like her pain today is about an 8/10. She is having difficulty turning to the right. She also feels tight on the left. She feels like one of her new exercises may be causing pain but she is not sure which one.    Pertinent History Pacemaker, Multi joint arthritis   Limitations Standing;Walking   How long can you sit comfortably? No limit    How long can you stand comfortably? No limit    How long can you walk comfortably? No limit    Diagnostic tests Cervical X-ray: Mild multi joint degeneration    Patient Stated Goals To have less pain in her neck    Currently in Pain? Yes   Pain Score 8    Pain Location Neck   Pain Orientation Right;Left   Pain Descriptors /  Indicators Aching   Pain Type Chronic pain   Pain Onset More than a month ago   Pain Frequency Constant                         [facility] Adult PT Treatment/Exercise - 05/04/17 0001      Manual Therapy   Manual therapy comments gentle sub-occipital release on wedge; gentle manual traction on wedge. Seated IASTYM to bilateral upper traps,                 PT Education - 05/04/17 1247    Education provided Yes   Education Details reviewed HEP; advised to not perform anything other then light movement and soft tissue over the weekend    Person(s) Educated Patient   Methods Explanation;Demonstration;Tactile cues;Verbal cues   Comprehension  Verbalized understanding;Returned demonstration;Verbal cues required;Tactile cues required          PT Short Term Goals - 04/24/17 0835      PT SHORT TERM GOAL #1   Title Patient will increase builateral cervical rotation by 25 degrees    Time 4   Period Weeks   Status New   Target Date 05/22/17     PT SHORT TERM GOAL #2   Title Patient will demsotrate full pain free cervical flexion    Time 4   Period Weeks   Status New   Target Date 05/22/17     PT SHORT TERM GOAL #3   Title Patient will demsogtrate 5/5 gross eright shoulder flexion    Time 4   Period Weeks   Status New   Target Date 05/22/17     PT SHORT TERM GOAL #4   Title Patient will report decreased tenderness to palpation in her right upper trap    Time 4   Period Weeks   Status New   Target Date 05/22/17           PT Long Term Goals - 04/24/17 0837      PT LONG TERM GOAL #1   Title Patient will be independent with exercise program to reduce spasming and inmprove posture   Time 8   Period Weeks   Status New   Target Date 06/19/17     PT LONG TERM GOAL #2   Title Patient will report no ram horn headaches for 1 week   Time 8   Period Weeks   Status New     PT LONG TERM GOAL #3   Title Patient wilkl demsotrate 60 degrees of rotation bilateral without pain in order to drive safely    Time 8   Period Weeks   Status New   Target Date 06/19/17               Plan - 05/04/17 1249    Clinical Impression Statement Patient was limited today by pain. Therapy focused on manula therapy. She was advised to stop new exercises and just work on soft tissue work over the weekend. Assess tolerance next week.    Clinical Presentation Evolving   Clinical Decision Making Moderate   Rehab Potential Good   PT Frequency 2x / week   PT Duration 8 weeks   PT Treatment/Interventions ADLs/Self Care Home Management;Cryotherapy;Electrical Stimulation;Iontophoresis 4mg /ml Dexamethasone;Therapeutic  activities;Therapeutic exercise;Neuromuscular re-education;Patient/family education;Manual techniques;Splinting;Taping;Passive range of motion;Moist Heat;Traction;Ultrasound;Dry needling   PT Next Visit Plan do manual work on wedge or sitting, light postrula exercises, when she sits in good postrue she gets pain from her  pace maker; consdier light pec release; decompresion position as much as possble on the wedge; Continue manual therapy to upper traps and cervical paraspianls; review scpa retraction.    PT Home Exercise Plan scap retraction; cervical rotation; upper trap stretch   Consulted and Agree with Plan of Care Patient      Patient will benefit from skilled therapeutic intervention in order to improve the following deficits and impairments:  Abnormal gait, Pain, Increased edema, Decreased strength, Impaired UE functional use, Decreased endurance, Decreased activity tolerance, Decreased range of motion, Postural dysfunction  Visit Diagnosis: Cervicalgia  Other muscle spasm     Problem List Patient Active Problem List   Diagnosis Date Noted  . 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 Atrial fibrillation ([place]) 11/17/2013  . Chronic systolic heart failure ([place]) 10/13/2013  . Hyponatremia 03/04/2013  . Constipation 09/13/2011  . GERD 09/13/2010  . HYPOTHYROIDISM 04/28/2009  . HYPERLIPIDEMIA 04/28/2009  . Hypertension 04/28/2009  . CARDIOMYOPATHY 04/28/2009  . LEFT BUNDLE BRANCH BLOCK 04/28/2009  . DEGENERATIVE JOINT DISEASE 04/28/2009    Carney Living PT DPT  05/04/2017, 12:53 PM  [facility] [address] [place], [name], [address] Phone: [phone]   Fax:  [phone]  Name: DELAINE HERNANDEZ MRN: [id number] Date of Birth: 1940-09-14

## 2017-05-08 ENCOUNTER — Ambulatory Visit: Payer: Medicare Other | Admitting: Physical Therapy

## 2017-05-08 DIAGNOSIS — M62838 Other muscle spasm: Secondary | ICD-10-CM | POA: Diagnosis not present

## 2017-05-08 DIAGNOSIS — M542 Cervicalgia: Secondary | ICD-10-CM

## 2017-05-08 NOTE — Therapy (Signed)
[place], [name], [address] Phone: [phone]   Fax:  [phone]  Physical Therapy Treatment  Patient Details  Name: [name] MRN: [id number] Date of Birth: January 29, 1941 Referring Provider: Dr Basil Dess  Encounter Date: 05/08/2017      PT End of Session - 05/08/17 1036    Visit Number 4   Number of Visits 16   Date for PT Re-Evaluation 06/19/17   Authorization Type Medicare traid    PT Start Time 1324   PT Stop Time 1054   PT Time Calculation (min) 39 min   Activity Tolerance Patient tolerated treatment well   Behavior During Therapy [facility] for tasks assessed/performed      Past Medical History:  Diagnosis Date  . 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 Cerebrovascular accident [facility])    No clinical event; asymptomatic CT finding  . DDD (degenerative disc disease), cervical    Cervical  . Degenerative joint disease    Right hip and knee  . GERD (gastroesophageal reflux disease)   . Hip dislocation, right ([place])   . Hyperlipemia   . Hypertension   . Hypothyroid   . LBBB (left bundle branch block)   . S/P colonoscopy 2009   Dr. Sharlett Iles: reportedly normal per pt  . S/P endoscopy 1980s   per pt; normal. performed secondary to reflux    Past Surgical History:  Procedure Laterality Date  . ABDOMINAL HYSTERECTOMY    . APPENDECTOMY    . BI-VENTRICULAR IMPLANTABLE CARDIOVERTER DEFIBRILLATOR  (CRT-D)  10-31-2013   MDT Hillery Aldo CRTD implanted by Dr Lovena Le  . BREAST BIOPSY     bilaterally; benign  . can not have MRI due to jaw surgery    . CHOLECYSTECTOMY    . COLONOSCOPY  2009  . ESOPHAGOGASTRODUODENOSCOPY  07/13/2011   small hh, patchy gastric erythema with scarring deformity of antrum, chronic gastritis, bx benign with no H.Pylori. Procedure: ESOPHAGOGASTRODUODENOSCOPY (EGD);   Surgeon: Daneil Dolin, MD;  [facility];  Service: Endoscopy;  Laterality: N/A;  10:45  . IMPLANTABLE CARDIOVERTER DEFIBRILLATOR IMPLANT N/A 10/31/2013   Procedure: IMPLANTABLE CARDIOVERTER DEFIBRILLATOR IMPLANT;  Surgeon: Evans Lance, MD;  Location: [facility] CATH LAB;  Service: Cardiovascular;  Laterality: N/A;  . MANDIBLE SURGERY     secondary to malocclusion  . THYROIDECTOMY     neoplasm  . Ureteral Surgery  1970    There were no vitals filed for this visit.      Subjective Assessment - 05/08/17 1019    Subjective Patient reports the left side has improved but the right side is still painful. Her pain level is about a 6/10.    Pertinent History Pacemaker, Multi joint arthritis   Limitations Standing;Walking   How long can you sit comfortably? No limit    How long can you stand comfortably? No limit    How long can you walk comfortably? No limit    Diagnostic tests Cervical X-ray: Mild multi joint degeneration    Patient Stated Goals To have less pain in her neck    Currently in Pain? Yes   Pain Score 8    Pain Location Neck   Pain Orientation Right;Left   Pain Descriptors / Indicators Aching   Pain Type Chronic pain   Pain Onset More than a month ago   Pain Relieving Factors rest  Effect of Pain on Daily Activities difficulty driving    Multiple Pain Sites No                         [facility] Adult PT Treatment/Exercise - 05/08/17 0001      Neck Exercises: Seated   Other Seated Exercise scap retraction 2x5 pain noted around her pacemaker ; cervical rotation 3x; reviewed how to use a thera-cane;    Other Seated Exercise seated ER red 2x10      Manual Therapy   Manual therapy comments gentle sub-occipital release on wedge; gentle manual traction on wedge. Seated IASTYM to bilateral upper traps,                 PT Education - 05/08/17 1034    Education provided Yes   Education Details reviewed exercises; reviewed activity tolerance     Person(s) Educated Patient   Methods Explanation;Demonstration;Tactile cues;Verbal cues   Comprehension Verbalized understanding;Returned demonstration;Need further instruction          PT Short Term Goals - 04/24/17 0835      PT SHORT TERM GOAL #1   Title Patient will increase builateral cervical rotation by 25 degrees    Time 4   Period Weeks   Status New   Target Date 05/22/17     PT SHORT TERM GOAL #2   Title Patient will demsotrate full pain free cervical flexion    Time 4   Period Weeks   Status New   Target Date 05/22/17     PT SHORT TERM GOAL #3   Title Patient will demsogtrate 5/5 gross eright shoulder flexion    Time 4   Period Weeks   Status New   Target Date 05/22/17     PT SHORT TERM GOAL #4   Title Patient will report decreased tenderness to palpation in her right upper trap    Time 4   Period Weeks   Status New   Target Date 05/22/17           PT Long Term Goals - 04/24/17 0837      PT LONG TERM GOAL #1   Title Patient will be independent with exercise program to reduce spasming and inmprove posture   Time 8   Period Weeks   Status New   Target Date 06/19/17     PT LONG TERM GOAL #2   Title Patient will report no ram horn headaches for 1 week   Time 8   Period Weeks   Status New     PT LONG TERM GOAL #3   Title Patient wilkl demsotrate 60 degrees of rotation bilateral without pain in order to drive safely    Time 8   Period Weeks   Status New   Target Date 06/19/17               Plan - 05/08/17 1637    Clinical Impression Statement Therapy attempted to work on different light ther-ex but it increased her left sided neck stiffness and increased her pain around her pace maker. After soft tissue mobilization she did report some decrease in stiffness on the right. Therapy can not perfrom modalities on her 2nd to her pacemsaker and can not perfrom dry needling 2nd to blood thinners. If ther-ex continues to cause stiffness and soft  tissue mobilization causes stiffness she may need to return to the MD.    Clinical Presentation Evolving   Clinical Decision Making  Moderate   Rehab Potential Good   PT Frequency 2x / week   PT Duration 8 weeks   PT Treatment/Interventions ADLs/Self Care Home Management;Cryotherapy;Electrical Stimulation;Iontophoresis 4mg /ml Dexamethasone;Therapeutic activities;Therapeutic exercise;Neuromuscular re-education;Patient/family education;Manual techniques;Splinting;Taping;Passive range of motion;Moist Heat;Traction;Ultrasound;Dry needling   PT Next Visit Plan do manual work on wedge or sitting, light postrula exercises, when she sits in good postrue she gets pain from her pace maker; consdier light pec release; decompresion position as much as possble on the wedge; Continue manual therapy to upper traps and cervical paraspianls; review scpa retraction.    PT Home Exercise Plan scap retraction; cervical rotation; upper trap stretch   Consulted and Agree with Plan of Care Patient      Patient will benefit from skilled therapeutic intervention in order to improve the following deficits and impairments:  Abnormal gait, Pain, Increased edema, Decreased strength, Impaired UE functional use, Decreased endurance, Decreased activity tolerance, Decreased range of motion, Postural dysfunction  Visit Diagnosis: Cervicalgia  Other muscle spasm     Problem List Patient Active Problem List   Diagnosis Date Noted  . 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 Atrial fibrillation ([place]) 11/17/2013  . Chronic systolic heart failure ([place]) 10/13/2013  . Hyponatremia 03/04/2013  . Constipation 09/13/2011  . GERD 09/13/2010  . HYPOTHYROIDISM 04/28/2009  . HYPERLIPIDEMIA 04/28/2009  . Hypertension 04/28/2009  . CARDIOMYOPATHY 04/28/2009  . LEFT BUNDLE BRANCH BLOCK 04/28/2009  . DEGENERATIVE JOINT DISEASE 04/28/2009    Carney Living PT DPT  05/08/2017, 4:50  PM  [facility] [address] [place], [name], [address] Phone: [phone]   Fax:  [phone]  Name: [name] MRN: [id number] Date of Birth: Sep 07, 1940

## 2017-05-10 ENCOUNTER — Other Ambulatory Visit: Payer: Self-pay | Admitting: Cardiovascular Disease

## 2017-05-10 DIAGNOSIS — I5023 Acute on chronic systolic (congestive) heart failure: Secondary | ICD-10-CM

## 2017-05-11 ENCOUNTER — Ambulatory Visit: Payer: Medicare Other | Admitting: Physical Therapy

## 2017-05-11 DIAGNOSIS — M542 Cervicalgia: Secondary | ICD-10-CM | POA: Diagnosis not present

## 2017-05-11 DIAGNOSIS — M62838 Other muscle spasm: Secondary | ICD-10-CM

## 2017-05-11 NOTE — Therapy (Signed)
[place], [name], [address] Phone: [phone]   Fax:  [phone]  Physical Therapy Treatment  Patient Details  Name: [name] MRN: [id number] Date of Birth: 19-Nov-1940 Referring Provider: Dr Basil Dess  Encounter Date: 05/11/2017      PT End of Session - 05/11/17 1127    Visit Number 5   Number of Visits 16   Date for PT Re-Evaluation 06/19/17   Authorization Type Medicare traid    PT Start Time 1018   PT Stop Time 1100   PT Time Calculation (min) 42 min   Activity Tolerance Patient tolerated treatment well   Behavior During Therapy [facility] for tasks assessed/performed      Past Medical History:  Diagnosis Date  . 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 Cerebrovascular accident [facility])    No clinical event; asymptomatic CT finding  . DDD (degenerative disc disease), cervical    Cervical  . Degenerative joint disease    Right hip and knee  . GERD (gastroesophageal reflux disease)   . Hip dislocation, right ([place])   . Hyperlipemia   . Hypertension   . Hypothyroid   . LBBB (left bundle branch block)   . S/P colonoscopy 2009   Dr. Sharlett Iles: reportedly normal per pt  . S/P endoscopy 1980s   per pt; normal. performed secondary to reflux    Past Surgical History:  Procedure Laterality Date  . ABDOMINAL HYSTERECTOMY    . APPENDECTOMY    . BI-VENTRICULAR IMPLANTABLE CARDIOVERTER DEFIBRILLATOR  (CRT-D)  10-31-2013   MDT Hillery Aldo CRTD implanted by Dr Lovena Le  . BREAST BIOPSY     bilaterally; benign  . can not have MRI due to jaw surgery    . CHOLECYSTECTOMY    . COLONOSCOPY  2009  . ESOPHAGOGASTRODUODENOSCOPY  07/13/2011   small hh, patchy gastric erythema with scarring deformity of antrum, chronic gastritis, bx benign with no H.Pylori. Procedure: ESOPHAGOGASTRODUODENOSCOPY (EGD);   Surgeon: Daneil Dolin, MD;  [facility];  Service: Endoscopy;  Laterality: N/A;  10:45  . IMPLANTABLE CARDIOVERTER DEFIBRILLATOR IMPLANT N/A 10/31/2013   Procedure: IMPLANTABLE CARDIOVERTER DEFIBRILLATOR IMPLANT;  Surgeon: Evans Lance, MD;  Location: [facility] CATH LAB;  Service: Cardiovascular;  Laterality: N/A;  . MANDIBLE SURGERY     secondary to malocclusion  . THYROIDECTOMY     neoplasm  . Ureteral Surgery  1970    There were no vitals filed for this visit.      Subjective Assessment - 05/11/17 1013    Subjective Patient reports her neck on both sides is feeling better. She has been working on her light exercises.    Pertinent History Pacemaker, Multi joint arthritis   Limitations Standing;Walking   How long can you sit comfortably? No limit    How long can you stand comfortably? No limit    How long can you walk comfortably? No limit    Diagnostic tests Cervical X-ray: Mild multi joint degeneration    Patient Stated Goals To have less pain in her neck    Currently in Pain? Yes   Pain Score 4    Pain Location Neck   Pain Orientation Right;Left   Pain Descriptors / Indicators Aching   Pain Type Chronic pain   Pain Onset More than a month ago   Pain Frequency Constant   Aggravating Factors  turning head; looking up and down    Pain Relieving Factors rest   Effect of Pain on Daily Activities difficulty driving                          [facility] Adult PT Treatment/Exercise - 05/11/17 0001      Neck Exercises: Seated   Other Seated Exercise scap retraction 2x5 pain noted around her pacemaker ; cervical rotation 3x; reviewed how to use a thera-cane;    Other Seated Exercise seated ER yellow  2x10; seated horizontal abdction 2x10 yellow      Modalities   Modalities Moist Heat     Moist Heat Therapy   Number Minutes Moist Heat 10 Minutes   Moist Heat Location Cervical     Manual Therapy   Manual therapy comments gentle sub-occipital release on  wedge; gentle manual traction on wedge. Seated IASTYM to bilateral upper traps,                 PT Education - 05/11/17 1126    Education provided Yes   Education Details continue with HEP; reviewed technique    Person(s) Educated Patient   Methods Explanation;Demonstration;Tactile cues;Verbal cues   Comprehension Verbalized understanding;Returned demonstration;Verbal cues required;Tactile cues required          PT Short Term Goals - 04/24/17 0835      PT SHORT TERM GOAL #1   Title Patient will increase builateral cervical rotation by 25 degrees    Time 4   Period Weeks   Status New   Target Date 05/22/17     PT SHORT TERM GOAL #2   Title Patient will demsotrate full pain free cervical flexion    Time 4   Period Weeks   Status New   Target Date 05/22/17     PT SHORT TERM GOAL #3   Title Patient will demsogtrate 5/5 gross eright shoulder flexion    Time 4   Period Weeks   Status New   Target Date 05/22/17     PT SHORT TERM GOAL #4   Title Patient will report decreased tenderness to palpation in her right upper trap    Time 4   Period Weeks   Status New   Target Date 05/22/17           PT Long Term Goals - 04/24/17 0837      PT LONG TERM GOAL #1   Title Patient will be independent with exercise program to reduce spasming and inmprove posture   Time 8   Period Weeks   Status New   Target Date 06/19/17     PT LONG TERM GOAL #2   Title Patient will report no ram horn headaches for 1 week   Time 8   Period Weeks   Status New     PT LONG TERM GOAL #3   Title Patient wilkl demsotrate 60 degrees of rotation bilateral without pain in order to drive safely    Time 8   Period Weeks   Status New   Target Date 06/19/17               Plan - 05/11/17 1128    Clinical Impression Statement Patient made some porgress today. She had no pain around her pacemaker. she hasd less spasming in her upper trap and into her paraspinals. She was given a  tennis ball for slef soft tissue mobilization.    Clinical Presentation Evolving  Clinical Decision Making Moderate   PT Treatment/Interventions ADLs/Self Care Home Management;Cryotherapy;Electrical Stimulation;Iontophoresis 4mg /ml Dexamethasone;Therapeutic activities;Therapeutic exercise;Neuromuscular re-education;Patient/family education;Manual techniques;Splinting;Taping;Passive range of motion;Moist Heat;Traction;Ultrasound;Dry needling   PT Next Visit Plan do manual work on wedge or sitting, light postrula exercises, when she sits in good postrue she gets pain from her pace maker; consdier light pec release; decompresion position as much as possble on the wedge; Continue manual therapy to upper traps and cervical paraspianls; review scpa retraction.    PT Home Exercise Plan scap retraction; cervical rotation; upper trap stretch   Consulted and Agree with Plan of Care Patient      Patient will benefit from skilled therapeutic intervention in order to improve the following deficits and impairments:  Abnormal gait, Pain, Increased edema, Decreased strength, Impaired UE functional use, Decreased endurance, Decreased activity tolerance, Decreased range of motion, Postural dysfunction  Visit Diagnosis: Cervicalgia  Other muscle spasm     Problem List Patient Active Problem List   Diagnosis Date Noted  . 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 Atrial fibrillation ([place]) 11/17/2013  . Chronic systolic heart failure ([place]) 10/13/2013  . Hyponatremia 03/04/2013  . Constipation 09/13/2011  . GERD 09/13/2010  . HYPOTHYROIDISM 04/28/2009  . HYPERLIPIDEMIA 04/28/2009  . Hypertension 04/28/2009  . CARDIOMYOPATHY 04/28/2009  . LEFT BUNDLE BRANCH BLOCK 04/28/2009  . DEGENERATIVE JOINT DISEASE 04/28/2009    Carney Living  PT DPT  05/11/2017, 11:37 AM  [facility] [address] [place],  [name], [address] Phone: [phone]   Fax:  [phone]  Name: [name] MRN: [id number] Date of Birth: 15-Nov-1940

## 2017-05-15 ENCOUNTER — Ambulatory Visit: Payer: Medicare Other | Admitting: Physical Therapy

## 2017-05-15 ENCOUNTER — Encounter: Payer: Self-pay | Admitting: Physical Therapy

## 2017-05-15 DIAGNOSIS — M542 Cervicalgia: Secondary | ICD-10-CM

## 2017-05-15 DIAGNOSIS — M62838 Other muscle spasm: Secondary | ICD-10-CM

## 2017-05-15 NOTE — Therapy (Signed)
[place], [name], [address] Phone: [phone]   Fax:  [phone]  Physical Therapy Treatment  Patient Details  Name: [name] MRN: [id number] Date of Birth: 10-Aug-1940 Referring Provider: Dr Basil Dess  Encounter Date: 05/15/2017      PT End of Session - 05/15/17 2051    Visit Number 5   Number of Visits 16   Date for PT Re-Evaluation 06/19/17   Authorization Type Medicare traid    PT Start Time 6962   PT Stop Time 1056   PT Time Calculation (min) 41 min   Activity Tolerance Patient tolerated treatment well   Behavior During Therapy [facility] for tasks assessed/performed      Past Medical History:  Diagnosis Date  . 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 Cerebrovascular accident [facility])    No clinical event; asymptomatic CT finding  . DDD (degenerative disc disease), cervical    Cervical  . Degenerative joint disease    Right hip and knee  . GERD (gastroesophageal reflux disease)   . Hip dislocation, right ([place])   . Hyperlipemia   . Hypertension   . Hypothyroid   . LBBB (left bundle branch block)   . S/P colonoscopy 2009   Dr. Sharlett Iles: reportedly normal per pt  . S/P endoscopy 1980s   per pt; normal. performed secondary to reflux    Past Surgical History:  Procedure Laterality Date  . ABDOMINAL HYSTERECTOMY    . APPENDECTOMY    . BI-VENTRICULAR IMPLANTABLE CARDIOVERTER DEFIBRILLATOR  (CRT-D)  10-31-2013   MDT Hillery Aldo CRTD implanted by Dr Lovena Le  . BREAST BIOPSY     bilaterally; benign  . can not have MRI due to jaw surgery    . CHOLECYSTECTOMY    . COLONOSCOPY  2009  . ESOPHAGOGASTRODUODENOSCOPY  07/13/2011   small hh, patchy gastric erythema with scarring deformity of antrum, chronic gastritis, bx benign with no H.Pylori. Procedure: ESOPHAGOGASTRODUODENOSCOPY (EGD);   Surgeon: Daneil Dolin, MD;  [facility];  Service: Endoscopy;  Laterality: N/A;  10:45  . IMPLANTABLE CARDIOVERTER DEFIBRILLATOR IMPLANT N/A 10/31/2013   Procedure: IMPLANTABLE CARDIOVERTER DEFIBRILLATOR IMPLANT;  Surgeon: Evans Lance, MD;  Location: [facility] CATH LAB;  Service: Cardiovascular;  Laterality: N/A;  . MANDIBLE SURGERY     secondary to malocclusion  . THYROIDECTOMY     neoplasm  . Ureteral Surgery  1970    There were no vitals filed for this visit.      Subjective Assessment - 05/15/17 1047    Subjective Patient reports continued improvement. She had some pain on Satryuday but she did a lot of yard.    Pertinent History Pacemaker, Multi joint arthritis   How long can you sit comfortably? No limit    How long can you stand comfortably? No limit    How long can you walk comfortably? No limit    Diagnostic tests Cervical X-ray: Mild multi joint degeneration    Currently in Pain? Yes   Pain Score 3    Pain Location Neck   Pain Orientation Right;Left   Pain Descriptors / Indicators Aching   Pain Type Chronic pain                         [facility] Adult PT Treatment/Exercise - 05/15/17 0001      Neck  Exercises: Seated   Other Seated Exercise scap retraction 2x5 pain noted around her pacemaker ; cervical rotation 3x; reviewed how to use a thera-cane;    Other Seated Exercise seated ER yellow  2x10; seated horizontal abdction 2x10 yellow      Modalities   Modalities Moist Heat     Moist Heat Therapy   Moist Heat Location Cervical     Manual Therapy   Manual therapy comments gentle sub-occipital release on wedge; gentle manual traction on wedge. Seated IASTYM to bilateral upper traps,                 PT Education - 05/15/17 2051    Education provided Yes   Education Details reviewed HEP;    Person(s) Educated Patient   Methods Explanation;Demonstration;Tactile cues;Verbal cues   Comprehension Verbalized understanding;Returned  demonstration;Verbal cues required;Tactile cues required          PT Short Term Goals - 05/15/17 2057      PT SHORT TERM GOAL #1   Title Patient will increase builateral cervical rotation by 25 degrees    Time 4   Period Weeks   Status On-going     PT SHORT TERM GOAL #2   Title Patient will demsotrate full pain free cervical flexion    Time 4   Period Weeks   Status On-going     PT SHORT TERM GOAL #3   Title Patient will demsogtrate 5/5 gross eright shoulder flexion    Time 4   Period Weeks   Status On-going     PT SHORT TERM GOAL #4   Title Patient will report decreased tenderness to palpation in her right upper trap    Time 4   Period Weeks   Status On-going           PT Long Term Goals - 04/24/17 9767      PT LONG TERM GOAL #1   Title Patient will be independent with exercise program to reduce spasming and inmprove posture   Time 8   Period Weeks   Status New   Target Date 06/19/17     PT LONG TERM GOAL #2   Title Patient will report no ram horn headaches for 1 week   Time 8   Period Weeks   Status New     PT LONG TERM GOAL #3   Title Patient wilkl demsotrate 60 degrees of rotation bilateral without pain in order to drive safely    Time 8   Period Weeks   Status New   Target Date 06/19/17               Plan - 05/15/17 2053    Clinical Impression Statement Patient is making progress. She continues to have movement restrictions on the right but it has improved. Therapy continues to progress exercises as tolerated. Patients is being more aware of posture.    Clinical Presentation Evolving   Clinical Decision Making Moderate   Rehab Potential Good   PT Frequency 2x / week   PT Duration 8 weeks   PT Treatment/Interventions ADLs/Self Care Home Management;Cryotherapy;Electrical Stimulation;Iontophoresis 4mg /ml Dexamethasone;Therapeutic activities;Therapeutic exercise;Neuromuscular re-education;Patient/family education;Manual  techniques;Splinting;Taping;Passive range of motion;Moist Heat;Traction;Ultrasound;Dry needling   PT Next Visit Plan do manual work on wedge or sitting, light postrula exercises, when she sits in good postrue she gets pain from her pace maker; consdier light pec release; decompresion position as much as possble on the wedge; Continue manual therapy to upper traps and cervical paraspianls; review  scpa retraction.    PT Home Exercise Plan scap retraction; cervical rotation; upper trap stretch   Consulted and Agree with Plan of Care Patient      Patient will benefit from skilled therapeutic intervention in order to improve the following deficits and impairments:  Abnormal gait, Pain, Increased edema, Decreased strength, Impaired UE functional use, Decreased endurance, Decreased activity tolerance, Decreased range of motion, Postural dysfunction  Visit Diagnosis: Cervicalgia  Other muscle spasm     Problem List Patient Active Problem List   Diagnosis Date Noted  . 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 Atrial fibrillation ([place]) 11/17/2013  . Chronic systolic heart failure ([place]) 10/13/2013  . Hyponatremia 03/04/2013  . Constipation 09/13/2011  . GERD 09/13/2010  . HYPOTHYROIDISM 04/28/2009  . HYPERLIPIDEMIA 04/28/2009  . Hypertension 04/28/2009  . CARDIOMYOPATHY 04/28/2009  . LEFT BUNDLE BRANCH BLOCK 04/28/2009  . DEGENERATIVE JOINT DISEASE 04/28/2009    Carney Living  PT DPT  05/15/2017, 9:01 PM  [facility] [address] [place], [name], [address] Phone: [phone]   Fax:  [phone]  Name: [name] MRN: [id number] Date of Birth: Aug 15, 1940

## 2017-05-17 ENCOUNTER — Encounter: Payer: Medicare Other | Admitting: Physical Therapy

## 2017-05-17 ENCOUNTER — Ambulatory Visit (INDEPENDENT_AMBULATORY_CARE_PROVIDER_SITE_OTHER): Payer: Medicare Other | Admitting: Specialist

## 2017-05-17 ENCOUNTER — Encounter (INDEPENDENT_AMBULATORY_CARE_PROVIDER_SITE_OTHER): Payer: Self-pay | Admitting: Specialist

## 2017-05-17 VITALS — BP 140/80 | HR 62 | Ht 67.0 in | Wt 222.0 lb

## 2017-05-17 DIAGNOSIS — M47812 Spondylosis without myelopathy or radiculopathy, cervical region: Secondary | ICD-10-CM | POA: Diagnosis not present

## 2017-05-17 NOTE — Progress Notes (Signed)
Office Visit Note   Patient: [name]           Date of Birth: 1941/03/15           MRN: [id number] Visit Date: 05/17/2017              Requested by: Sinda Du, MD [place], [place] [address] PCP: Sinda Du, MD   Assessment & Plan: Visit Diagnoses:  1. Spondylosis without myelopathy or radiculopathy, cervical region     Plan: Avoid overhead lifting and overhead use of the arms. Do not lift greater than 5 lbs. Adjust head rest in vehicle to prevent hyperextension if rear ended. Take extra precautions to avoid falling, including use of a cane if you feel weak. Tylenol 500-650 mg take up to TID or QID. Take the tramadol for pain beyond what tylenol assists. Hot showers and ice at the end of the day.  Pt continues for 3-4 weeks.  Follow-Up Instructions: Return in about 6 weeks (around 06/28/2017).   Orders:  No orders of the defined types were placed in this encounter.  No orders of the defined types were placed in this encounter.     Procedures: No procedures performed   Clinical Data: No additional findings.   Subjective: Chief Complaint  Patient presents with  . Neck - Follow-up    76 year old female with history of previous left hemibody CVA she has been experiencing cervicalgia and right sided occipital headaches. Has been in PT at [facility] PT on [facility]. Trying cervical traction and masssage therapy. Upper extremity muscle strengthening. She had a light stroke about 3 year ago and had a defibrillator placed and is on xarelto chronically. She is overall improving but there still remains some stiffness and discomfort. The head ache right occipital is much Improved. She is working with Rosana Hoes at [employer] and has 2-3 more weeks available. At home she uses an old fashion pillow. No falls, She does use a cane for balance. Her bowel and bladder are normal, she takes meds for constipation.     Review of Systems  Constitutional:  Negative for activity change, appetite change, chills, diaphoresis, fatigue, fever and unexpected weight change.  HENT: Positive for congestion, rhinorrhea, sinus pain, sinus pressure and sneezing. Negative for dental problem, drooling, ear discharge, ear pain, facial swelling, hearing loss, mouth sores, nosebleeds, postnasal drip, sore throat, tinnitus, trouble swallowing and voice change.   Eyes: Positive for visual disturbance. Negative for photophobia, pain, discharge, redness and itching.  Respiratory: Positive for shortness of breath and wheezing. Negative for apnea, cough, choking, chest tightness and stridor.   Cardiovascular: Positive for palpitations and leg swelling. Negative for chest pain.  Gastrointestinal: Negative for abdominal distention, abdominal pain, anal bleeding, blood in stool, constipation, diarrhea, nausea, rectal pain and vomiting.  Endocrine: Positive for cold intolerance and heat intolerance. Negative for polydipsia, polyphagia and polyuria.  Genitourinary: Negative for difficulty urinating, dyspareunia, dysuria, enuresis, flank pain, frequency, genital sores and hematuria.  Musculoskeletal: Positive for neck pain and neck stiffness. Negative for arthralgias and back pain.  Skin: Negative for color change, pallor, rash and wound.  Allergic/Immunologic: Positive for environmental allergies. Negative for food allergies and immunocompromised state.     Objective: Vital Signs: BP 140/80 (BP Location: Left Arm, Patient Position: Sitting)   Pulse 62   Ht 5\' 7"  (1.702 m)   Wt 222 lb (100.7 kg)   BMI 34.77 kg/m   Physical Exam  Constitutional: She  is oriented to person, place, and time. She appears well-developed and well-nourished.  HENT:  Head: Normocephalic and atraumatic.  Eyes: Pupils are equal, round, and reactive to light. EOM are normal. Right eye exhibits no discharge. Left eye exhibits no discharge.  Neck: Normal range of motion. Neck supple. No JVD present.  No tracheal deviation present. No thyromegaly present.  Cardiovascular: Exam reveals no gallop and no friction rub.   No murmur heard. Pulmonary/Chest: Effort normal and breath sounds normal. No stridor. No respiratory distress. She has no wheezes. She has no rales. She exhibits no tenderness.  Abdominal: Soft. Bowel sounds are normal. She exhibits no distension. There is no tenderness. There is no guarding.  Musculoskeletal: She exhibits no edema, tenderness or deformity.  Lymphadenopathy:    She has no cervical adenopathy.  Neurological: She is alert and oriented to person, place, and time. She displays normal reflexes. No cranial nerve deficit or sensory deficit. She exhibits normal muscle tone. Coordination normal.  Skin: Skin is warm and dry. No rash noted. No erythema. No pallor.  Psychiatric: She has a normal mood and affect. Her behavior is normal. Judgment and thought content normal.    Back Exam   Tenderness  The patient is experiencing tenderness in the cervical.  Range of Motion  Extension: abnormal  Flexion: normal  Lateral Bend Right: abnormal  Lateral Bend Left: abnormal  Rotation Right: abnormal  Rotation Left: abnormal   Muscle Strength  Right Quadriceps:  5/5  Left Quadriceps:  5/5  Right Hamstrings:  5/5  Left Hamstrings:  5/5   Tests  Straight leg raise right: negative Straight leg raise left: negative  Reflexes  Patellar: normal Achilles: normal Biceps: normal Babinski's sign: normal   Other  Toe Walk: normal Heel Walk: normal Sensation: normal Gait: normal  Erythema: no back redness Scars: present      Specialty Comments:  No specialty comments available.  Imaging: No results found.   PMFS History: Patient Active Problem List   Diagnosis Date Noted  . 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 Atrial fibrillation ([place]) 11/17/2013  . Chronic systolic heart failure ([place]) 10/13/2013  .  Hyponatremia 03/04/2013  . Constipation 09/13/2011  . GERD 09/13/2010  . HYPOTHYROIDISM 04/28/2009  . HYPERLIPIDEMIA 04/28/2009  . Hypertension 04/28/2009  . CARDIOMYOPATHY 04/28/2009  . LEFT BUNDLE BRANCH BLOCK 04/28/2009  . DEGENERATIVE JOINT DISEASE 04/28/2009   Past Medical History:  Diagnosis Date  . 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 Cerebrovascular accident [facility])    No clinical event; asymptomatic CT finding  . DDD (degenerative disc disease), cervical    Cervical  . Degenerative joint disease    Right hip and knee  . GERD (gastroesophageal reflux disease)   . Hip dislocation, right ([place])   . Hyperlipemia   . Hypertension   . Hypothyroid   . LBBB (left bundle branch block)   . S/P colonoscopy 2009   Dr. Sharlett Iles: reportedly normal per pt  . S/P endoscopy 1980s   per pt; normal. performed secondary to reflux    Family History  Problem Relation Age of Onset  . Brain cancer Mother   . Atrial fibrillation Other   . Colon cancer Neg Hx     Past Surgical History:  Procedure Laterality Date  . ABDOMINAL HYSTERECTOMY    . APPENDECTOMY    . BI-VENTRICULAR IMPLANTABLE CARDIOVERTER DEFIBRILLATOR  (  CRT-D)  10-31-2013   MDT Hillery Aldo CRTD implanted by Dr Lovena Le  . BREAST BIOPSY     bilaterally; benign  . can not have MRI due to jaw surgery    . CHOLECYSTECTOMY    . COLONOSCOPY  2009  . ESOPHAGOGASTRODUODENOSCOPY  07/13/2011   small hh, patchy gastric erythema with scarring deformity of antrum, chronic gastritis, bx benign with no H.Pylori. Procedure: ESOPHAGOGASTRODUODENOSCOPY (EGD);  Surgeon: Daneil Dolin, MD;  [facility];  Service: Endoscopy;  Laterality: N/A;  10:45  . IMPLANTABLE CARDIOVERTER DEFIBRILLATOR IMPLANT N/A 10/31/2013   Procedure: IMPLANTABLE CARDIOVERTER DEFIBRILLATOR IMPLANT;  Surgeon: Evans Lance, MD;  Location: [facility] C[facility] LAB;   Service: Cardiovascular;  Laterality: N/A;  . MANDIBLE SURGERY     secondary to malocclusion  . THYROIDECTOMY     neoplasm  . Ureteral Surgery  1970   Social History   Occupational History  . Not on file.   Social History Main Topics  . Smoking status: Never Smoker  . Smokeless tobacco: Never Used  . Alcohol use No  . Drug use: No  . Sexual activity: No

## 2017-05-17 NOTE — Patient Instructions (Addendum)
Avoid overhead lifting and overhead use of the arms. Do not lift greater than 5 lbs. Adjust head rest in vehicle to prevent hyperextension if rear ended. Take extra precautions to avoid falling, including use of a cane if you feel weak. Tylenol 500-650 mg take up to TID or QID. Equate is 650 mg tablet. Take the tramadol for pain beyond what tylenol assists. Hot showers and ice at the end of the day.  Pt continues for 3-4 weeks.

## 2017-05-18 ENCOUNTER — Ambulatory Visit: Payer: Medicare Other | Attending: Specialist | Admitting: Physical Therapy

## 2017-05-18 DIAGNOSIS — M542 Cervicalgia: Secondary | ICD-10-CM | POA: Insufficient documentation

## 2017-05-18 DIAGNOSIS — M62838 Other muscle spasm: Secondary | ICD-10-CM | POA: Insufficient documentation

## 2017-05-21 NOTE — Therapy (Signed)
[place], [name], [address] Phone: [phone]   Fax:  [phone]  Physical Therapy Treatment  Patient Details  Name: [name] MRN: [id number] Date of Birth: Jan 04, 1941 Referring Provider: Dr Basil Dess   Encounter Date: 05/18/2017  PT End of Session - 05/21/17 0809    Visit Number  6    Number of Visits  16    Date for PT Re-Evaluation  06/19/17    Authorization Type  Medicare traid     PT Start Time  6962    PT Stop Time  1058    PT Time Calculation (min)  43 min    Activity Tolerance  Patient tolerated treatment well    Behavior During Therapy  [facility] for tasks assessed/performed       Past Medical History:  Diagnosis Date  . 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 Cerebrovascular accident [facility])    No clinical event; asymptomatic CT finding  . DDD (degenerative disc disease), cervical    Cervical  . Degenerative joint disease    Right hip and knee  . GERD (gastroesophageal reflux disease)   . Hip dislocation, right ([place])   . Hyperlipemia   . Hypertension   . Hypothyroid   . LBBB (left bundle branch block)   . S/P colonoscopy 2009   Dr. Sharlett Iles: reportedly normal per pt  . S/P endoscopy 1980s   per pt; normal. performed secondary to reflux    Past Surgical History:  Procedure Laterality Date  . ABDOMINAL HYSTERECTOMY    . APPENDECTOMY    . BI-VENTRICULAR IMPLANTABLE CARDIOVERTER DEFIBRILLATOR  (CRT-D)  10-31-2013   MDT Hillery Aldo CRTD implanted by Dr Lovena Le  . BREAST BIOPSY     bilaterally; benign  . can not have MRI due to jaw surgery    . CHOLECYSTECTOMY    . COLONOSCOPY  2009  . MANDIBLE SURGERY     secondary to malocclusion  . THYROIDECTOMY     neoplasm  . Ureteral Surgery  1970    There were no vitals filed for this visit.  Subjective Assessment - 05/21/17  0805    Subjective  Patient was good after the last visit but last night she had some pain. It is better this morning. she has been to the MD who would like him to continue with his HEP.     Pertinent History  Pacemaker, Multi joint arthritis    Limitations  Standing;Walking    How long can you sit comfortably?  No limit     How long can you stand comfortably?  No limit     How long can you walk comfortably?  No limit     Diagnostic tests  Cervical X-ray: Mild multi joint degeneration     Patient Stated Goals  To have less pain in her neck     Currently in Pain?  Yes    Pain Score  2     Pain Location  Neck    Pain Orientation  Right;Left    Pain Descriptors / Indicators  Aching    Pain Type  Chronic pain    Pain Onset  More than a month ago    Pain Frequency  Constant    Aggravating Factors   turning head; looking down     Pain Relieving Factors  rest     Effect  of Pain on Daily Activities  difficulty driving    Multiple Pain Sites  No                              PT Education - 05/21/17 0808    Education provided  Yes    Education Details  reviewed HEP     Person(s) Educated  Patient    Methods  Explanation;Demonstration;Verbal cues    Comprehension  Verbalized understanding       PT Short Term Goals - 05/21/17 0815      PT SHORT TERM GOAL #1   Title  Patient will increase builateral cervical rotation by 25 degrees     Time  4    Period  Weeks    Status  On-going      PT SHORT TERM GOAL #2   Title  Patient will demsotrate full pain free cervical flexion     Time  4    Period  Weeks    Status  On-going      PT SHORT TERM GOAL #3   Title  Patient will demsogtrate 5/5 gross eright shoulder flexion     Time  4    Period  Weeks    Status  On-going      PT SHORT TERM GOAL #4   Title  Patient will report decreased tenderness to palpation in her right upper trap     Time  4    Period  Weeks    Status  On-going        PT Long Term Goals -  04/24/17 9798      PT LONG TERM GOAL #1   Title  Patient will be independent with exercise program to reduce spasming and inmprove posture    Time  8    Period  Weeks    Status  New    Target Date  06/19/17      PT LONG TERM GOAL #2   Title  Patient will report no ram horn headaches for 1 week    Time  8    Period  Weeks    Status  New      PT LONG TERM GOAL #3   Title  Patient wilkl demsotrate 60 degrees of rotation bilateral without pain in order to drive safely     Time  8    Period  Weeks    Status  New    Target Date  06/19/17            Plan - 05/21/17 9211    Clinical Impression Statement  Patient is making progress. The MD told her this is likley her only option at this time 2nd to other medical problmes. She reports her neck is better but it flairs up at times.     Clinical Presentation  Evolving    Clinical Decision Making  Moderate    Rehab Potential  Good    PT Treatment/Interventions  ADLs/Self Care Home Management;Cryotherapy;Electrical Stimulation;Iontophoresis 4mg /ml Dexamethasone;Therapeutic activities;Therapeutic exercise;Neuromuscular re-education;Patient/family education;Manual techniques;Splinting;Taping;Passive range of motion;Moist Heat;Traction;Ultrasound;Dry needling    PT Next Visit Plan  do manual work on wedge or sitting, light postrula exercises, when she sits in good postrue she gets pain from her pace maker; consdier light pec release; decompresion position as much as possble on the wedge; Continue manual therapy to upper traps and cervical paraspianls; review scpa retraction.     PT Home Exercise Plan  scap retraction; cervical rotation; upper trap stretch    Consulted and Agree with Plan of Care  Patient       Patient will benefit from skilled therapeutic intervention in order to improve the following deficits and impairments:  Abnormal gait, Pain, Increased edema, Decreased strength, Impaired UE functional use, Decreased endurance, Decreased  activity tolerance, Decreased range of motion, Postural dysfunction  Visit Diagnosis: Cervicalgia  Other muscle spasm     Problem List Patient Active Problem List   Diagnosis Date Noted  . 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 Atrial fibrillation ([place]) 11/17/2013  . Chronic systolic heart failure ([place]) 10/13/2013  . Hyponatremia 03/04/2013  . Constipation 09/13/2011  . GERD 09/13/2010  . HYPOTHYROIDISM 04/28/2009  . HYPERLIPIDEMIA 04/28/2009  . Hypertension 04/28/2009  . CARDIOMYOPATHY 04/28/2009  . LEFT BUNDLE BRANCH BLOCK 04/28/2009  . DEGENERATIVE JOINT DISEASE 04/28/2009    Carney Living  PT DPT  05/21/2017, 8:18 AM  [facility] [address] [place], [name], [address] Phone: [phone]   Fax:  [phone]  Name: [name] MRN: [id number] Date of Birth: 01-Aug-1940

## 2017-05-22 ENCOUNTER — Ambulatory Visit: Payer: Medicare Other | Admitting: Physical Therapy

## 2017-05-22 ENCOUNTER — Encounter: Payer: Self-pay | Admitting: Physical Therapy

## 2017-05-22 DIAGNOSIS — M62838 Other muscle spasm: Secondary | ICD-10-CM

## 2017-05-22 DIAGNOSIS — M542 Cervicalgia: Secondary | ICD-10-CM

## 2017-05-23 NOTE — Therapy (Signed)
[place], [name], [address] Phone: [phone]   Fax:  [phone]  Physical Therapy Treatment  Patient Details  Name: [name] MRN: [id number] Date of Birth: 22-Apr-1941 Referring Provider: Dr Basil Dess   Encounter Date: 05/22/2017  PT End of Session - 05/23/17 0806    Visit Number  7    Number of Visits  16    Date for PT Re-Evaluation  06/19/17    Authorization Type  Medicare traid     PT Start Time  7989    PT Stop Time  1104    PT Time Calculation (min)  49 min    Activity Tolerance  Patient tolerated treatment well    Behavior During Therapy  [facility] for tasks assessed/performed       Past Medical History:  Diagnosis Date  . 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 Cerebrovascular accident [facility])    No clinical event; asymptomatic CT finding  . DDD (degenerative disc disease), cervical    Cervical  . Degenerative joint disease    Right hip and knee  . GERD (gastroesophageal reflux disease)   . Hip dislocation, right ([place])   . Hyperlipemia   . Hypertension   . Hypothyroid   . LBBB (left bundle branch block)   . S/P colonoscopy 2009   Dr. Sharlett Iles: reportedly normal per pt  . S/P endoscopy 1980s   per pt; normal. performed secondary to reflux    Past Surgical History:  Procedure Laterality Date  . ABDOMINAL HYSTERECTOMY    . APPENDECTOMY    . BI-VENTRICULAR IMPLANTABLE CARDIOVERTER DEFIBRILLATOR  (CRT-D)  10-31-2013   MDT Hillery Aldo CRTD implanted by Dr Lovena Le  . BREAST BIOPSY     bilaterally; benign  . can not have MRI due to jaw surgery    . CHOLECYSTECTOMY    . COLONOSCOPY  2009  . MANDIBLE SURGERY     secondary to malocclusion  . THYROIDECTOMY     neoplasm  . Ureteral Surgery  1970    There were no vitals filed for this visit.  Subjective Assessment - 05/22/17  1043    Subjective  [place] felt like her neck was very loose until she was driving over here. she began to have some stiffness inher neck.     Pertinent History  Pacemaker, Multi joint arthritis    Limitations  Standing;Walking    Diagnostic tests  Cervical X-ray: Mild multi joint degeneration     Patient Stated Goals  To have less pain in her neck     Currently in Pain?  No/denies                      [facility] Adult PT Treatment/Exercise - 05/23/17 0001      Neck Exercises: Seated   Other Seated Exercise  scap retraction 2x5 pain noted around her pacemaker ; cervical rotation 3x; reviewed how to use a thera-cane; Pulleys into flexion 2 min limited movement of left     Other Seated Exercise  seated ER yellow  2x10; seated horizontal abdction 2x10 yellow       Modalities   Modalities  Moist Heat      Moist Heat Therapy   Number Minutes Moist Heat  10 Minutes    Moist Heat Location  Cervical      Manual Therapy  Manual therapy comments  gentle sub-occipital release on wedge; gentle manual traction on wedge. Seated IASTYM to bilateral upper traps,              PT Education - 05/23/17 0805    Education provided  Yes    Education Details  reviewed techqinue with exercises     Person(s) Educated  Patient    Methods  Explanation;Demonstration;Verbal cues;Tactile cues    Comprehension  Verbalized understanding;Returned demonstration       PT Short Term Goals - 05/23/17 0808      PT SHORT TERM GOAL #1   Title  Patient will increase builateral cervical rotation by 25 degrees     Time  4    Period  Weeks    Status  On-going    Target Date  05/22/17      PT SHORT TERM GOAL #2   Title  Patient will demsotrate full pain free cervical flexion     Time  4    Period  Weeks    Status  On-going      PT SHORT TERM GOAL #3   Title  Patient will demsogtrate 5/5 gross eright shoulder flexion     Time  4    Period  Weeks    Status  On-going      PT SHORT TERM  GOAL #4   Title  Patient will report decreased tenderness to palpation in her right upper trap     Time  4    Period  Weeks    Status  On-going        PT Long Term Goals - 04/24/17 2505      PT LONG TERM GOAL #1   Title  Patient will be independent with exercise program to reduce spasming and inmprove posture    Time  8    Period  Weeks    Status  New    Target Date  06/19/17      PT LONG TERM GOAL #2   Title  Patient will report no ram horn headaches for 1 week    Time  8    Period  Weeks    Status  New      PT LONG TERM GOAL #3   Title  Patient wilkl demsotrate 60 degrees of rotation bilateral without pain in order to drive safely     Time  8    Period  Weeks    Status  New    Target Date  06/19/17            Plan - 05/23/17 0807    Clinical Impression Statement  Per palaption spasming appears to be decreaseing. she is no longer having spasming into her peri-scapula area. She was encouraged to continue with HEP>     Clinical Presentation  Evolving    Clinical Decision Making  Moderate    Rehab Potential  Good    PT Frequency  2x / week    PT Duration  8 weeks    PT Treatment/Interventions  ADLs/Self Care Home Management;Cryotherapy;Electrical Stimulation;Iontophoresis 4mg /ml Dexamethasone;Therapeutic activities;Therapeutic exercise;Neuromuscular re-education;Patient/family education;Manual techniques;Splinting;Taping;Passive range of motion;Moist Heat;Traction;Ultrasound;Dry needling    PT Next Visit Plan  do manual work on wedge or sitting, light postrula exercises, when she sits in good postrue she gets pain from her pace maker; consdier light pec release; decompresion position as much as possble on the wedge; Continue manual therapy to upper traps and cervical paraspianls; review scpa retraction.  PT Home Exercise Plan  scap retraction; cervical rotation; upper trap stretch    Consulted and Agree with Plan of Care  Patient       Patient will benefit from  skilled therapeutic intervention in order to improve the following deficits and impairments:  Abnormal gait, Pain, Increased edema, Decreased strength, Impaired UE functional use, Decreased endurance, Decreased activity tolerance, Decreased range of motion, Postural dysfunction  Visit Diagnosis: Cervicalgia  Other muscle spasm     Problem List Patient Active Problem List   Diagnosis Date Noted  . 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 Atrial fibrillation ([place]) 11/17/2013  . Chronic systolic heart failure ([place]) 10/13/2013  . Hyponatremia 03/04/2013  . Constipation 09/13/2011  . GERD 09/13/2010  . HYPOTHYROIDISM 04/28/2009  . HYPERLIPIDEMIA 04/28/2009  . Hypertension 04/28/2009  . CARDIOMYOPATHY 04/28/2009  . LEFT BUNDLE BRANCH BLOCK 04/28/2009  . DEGENERATIVE JOINT DISEASE 04/28/2009    Carney Living PT DPT  05/23/2017, 8:10 AM  [facility] [address] [place], [name], [address] Phone: [phone]   Fax:  [phone]  Name: [name] MRN: [id number] Date of Birth: October 16, 1940

## 2017-05-25 ENCOUNTER — Ambulatory Visit: Payer: Medicare Other | Admitting: Physical Therapy

## 2017-05-25 DIAGNOSIS — M62838 Other muscle spasm: Secondary | ICD-10-CM

## 2017-05-25 DIAGNOSIS — M542 Cervicalgia: Secondary | ICD-10-CM

## 2017-05-28 ENCOUNTER — Encounter: Payer: Self-pay | Admitting: Physical Therapy

## 2017-05-28 NOTE — Therapy (Signed)
[place], [name], [address] Phone: [phone]   Fax:  [phone]  Physical Therapy Treatment  Patient Details  Name: [name] MRN: [id number] Date of Birth: May 07, 1941 Referring Provider: Dr Basil Dess   Encounter Date: 05/25/2017  PT End of Session - 05/28/17 0748    Visit Number  8    Number of Visits  16    Date for PT Re-Evaluation  06/19/17    Authorization Type  Medicare traid     PT Start Time  4259    PT Stop Time  1233    PT Time Calculation (min)  48 min    Activity Tolerance  Patient tolerated treatment well    Behavior During Therapy  [facility] for tasks assessed/performed       Past Medical History:  Diagnosis Date  . 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 Cerebrovascular accident [facility])    No clinical event; asymptomatic CT finding  . DDD (degenerative disc disease), cervical    Cervical  . Degenerative joint disease    Right hip and knee  . GERD (gastroesophageal reflux disease)   . Hip dislocation, right ([place])   . Hyperlipemia   . Hypertension   . Hypothyroid   . LBBB (left bundle branch block)   . S/P colonoscopy 2009   Dr. Sharlett Iles: reportedly normal per pt  . S/P endoscopy 1980s   per pt; normal. performed secondary to reflux    Past Surgical History:  Procedure Laterality Date  . ABDOMINAL HYSTERECTOMY    . APPENDECTOMY    . BI-VENTRICULAR IMPLANTABLE CARDIOVERTER DEFIBRILLATOR  (CRT-D)  10-31-2013   MDT Hillery Aldo CRTD implanted by Dr Lovena Le  . BREAST BIOPSY     bilaterally; benign  . can not have MRI due to jaw surgery    . CHOLECYSTECTOMY    . COLONOSCOPY  2009  . MANDIBLE SURGERY     secondary to malocclusion  . THYROIDECTOMY     neoplasm  . Ureteral Surgery  1970    There were no vitals filed for this visit.  Subjective Assessment - 05/28/17  0747    Subjective  Patient reports pain since Tuesday. She reports significant pain on the left. She has pain when she turns her head.     Pertinent History  Pacemaker, Multi joint arthritis    Limitations  Standing;Walking    How long can you sit comfortably?  No limit     How long can you stand comfortably?  No limit     How long can you walk comfortably?  No limit     Diagnostic tests  Cervical X-ray: Mild multi joint degeneration     Patient Stated Goals  To have less pain in her neck     Currently in Pain?  No/denies                              PT Education - 05/28/17 0748    Education provided  Yes    Education Details  reviewed technique with therapy     Person(s) Educated  Patient    Methods  Explanation;Demonstration;Tactile cues;Verbal cues    Comprehension  Verbalized understanding;Returned demonstration       PT Short Term Goals - 05/23/17 0808      PT SHORT TERM  GOAL #1   Title  Patient will increase builateral cervical rotation by 25 degrees     Time  4    Period  Weeks    Status  On-going    Target Date  05/22/17      PT SHORT TERM GOAL #2   Title  Patient will demsotrate full pain free cervical flexion     Time  4    Period  Weeks    Status  On-going      PT SHORT TERM GOAL #3   Title  Patient will demsogtrate 5/5 gross eright shoulder flexion     Time  4    Period  Weeks    Status  On-going      PT SHORT TERM GOAL #4   Title  Patient will report decreased tenderness to palpation in her right upper trap     Time  4    Period  Weeks    Status  On-going        PT Long Term Goals - 05/28/17 0754      PT LONG TERM GOAL #1   Title  Patient will be independent with exercise program to reduce spasming and inmprove posture    Time  8    Period  Weeks    Status  On-going      PT LONG TERM GOAL #2   Title  Patient will report no ram horn headaches for 1 week    Time  8    Period  Weeks    Status  On-going      PT LONG  TERM GOAL #3   Title  Patient wilkl demsotrate 60 degrees of rotation bilateral without pain in order to drive safely     Time  8    Period  Weeks    Status  On-going            Plan - 05/28/17 0752    Clinical Impression Statement  Thwerapy held on exercises an focused on amnual work. The patient felt better after treatment. The patient seems to make progress then has set backs. She can not recall anything she did that would have set it off. She did come to therapy but she didint do anything different. Therapy will continue to work soft tissue mobilizsation and postrual correction.     Clinical Presentation  Evolving    Clinical Decision Making  Moderate    Rehab Potential  Good    PT Frequency  2x / week    PT Duration  8 weeks    PT Treatment/Interventions  ADLs/Self Care Home Management;Cryotherapy;Electrical Stimulation;Iontophoresis 4mg /ml Dexamethasone;Therapeutic activities;Therapeutic exercise;Neuromuscular re-education;Patient/family education;Manual techniques;Splinting;Taping;Passive range of motion;Moist Heat;Traction;Ultrasound;Dry needling    PT Next Visit Plan  do manual work on wedge or sitting, light postrula exercises, when she sits in good postrue she gets pain from her pace maker; consdier light pec release; decompresion position as much as possble on the wedge; Continue manual therapy to upper traps and cervical paraspianls; review scpa retraction.     PT Home Exercise Plan  scap retraction; cervical rotation; upper trap stretch    Consulted and Agree with Plan of Care  Patient       Patient will benefit from skilled therapeutic intervention in order to improve the following deficits and impairments:  Abnormal gait, Pain, Increased edema, Decreased strength, Impaired UE functional use, Decreased endurance, Decreased activity tolerance, Decreased range of motion, Postural dysfunction  Visit Diagnosis: Cervicalgia  Other muscle spasm  Problem List Patient  Active Problem List   Diagnosis Date Noted  . 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 Atrial fibrillation ([place]) 11/17/2013  . Chronic systolic heart failure ([place]) 10/13/2013  . Hyponatremia 03/04/2013  . Constipation 09/13/2011  . GERD 09/13/2010  . HYPOTHYROIDISM 04/28/2009  . HYPERLIPIDEMIA 04/28/2009  . Hypertension 04/28/2009  . CARDIOMYOPATHY 04/28/2009  . LEFT BUNDLE BRANCH BLOCK 04/28/2009  . DEGENERATIVE JOINT DISEASE 04/28/2009    Carney Living PT DPT  05/28/2017, 7:55 AM  [facility] [address] [place], [name], [address] Phone: [phone]   Fax:  [phone]  Name: [name] MRN: [id number] Date of Birth: [birth year]

## 2017-05-29 ENCOUNTER — Ambulatory Visit: Payer: Medicare Other | Admitting: Nurse Practitioner

## 2017-05-29 ENCOUNTER — Ambulatory Visit: Payer: Medicare Other | Admitting: Physical Therapy

## 2017-05-29 DIAGNOSIS — M542 Cervicalgia: Secondary | ICD-10-CM

## 2017-05-29 DIAGNOSIS — M62838 Other muscle spasm: Secondary | ICD-10-CM | POA: Diagnosis not present

## 2017-05-30 ENCOUNTER — Other Ambulatory Visit: Payer: Self-pay | Admitting: Cardiovascular Disease

## 2017-05-30 NOTE — Therapy (Signed)
[place], [name], [address] Phone: [phone]   Fax:  [phone]  Physical Therapy Treatment  Patient Details  Name: [name] MRN: [id number] Date of Birth: 1941/07/09 Referring Provider: Dr Basil Dess   Encounter Date: 05/29/2017  PT End of Session - 05/30/17 1114    Visit Number  9    Number of Visits  16    Date for PT Re-Evaluation  06/19/17    Authorization Type  Medicare traid  G-code and re-assessment next visit     PT Start Time  1015    PT Stop Time  1104    PT Time Calculation (min)  49 min    Activity Tolerance  Patient tolerated treatment well    Behavior During Therapy  [facility] for tasks assessed/performed       Past Medical History:  Diagnosis Date  . 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 Cerebrovascular accident [facility])    No clinical event; asymptomatic CT finding  . DDD (degenerative disc disease), cervical    Cervical  . Degenerative joint disease    Right hip and knee  . GERD (gastroesophageal reflux disease)   . Hip dislocation, right ([place])   . Hyperlipemia   . Hypertension   . Hypothyroid   . LBBB (left bundle branch block)   . S/P colonoscopy 2009   Dr. Sharlett Iles: reportedly normal per pt  . S/P endoscopy 1980s   per pt; normal. performed secondary to reflux    Past Surgical History:  Procedure Laterality Date  . ABDOMINAL HYSTERECTOMY    . APPENDECTOMY    . BI-VENTRICULAR IMPLANTABLE CARDIOVERTER DEFIBRILLATOR  (CRT-D)  10-31-2013   MDT Hillery Aldo CRTD implanted by Dr Lovena Le  . BREAST BIOPSY     bilaterally; benign  . can not have MRI due to jaw surgery    . CHOLECYSTECTOMY    . COLONOSCOPY  2009  . MANDIBLE SURGERY     secondary to malocclusion  . THYROIDECTOMY     neoplasm  . Ureteral Surgery  1970    There were no vitals filed for this  visit.  Subjective Assessment - 05/30/17 1112    Subjective  Patient reports her pain on the right side has improved. she continues to work on light stretching and exercises.     Pertinent History  Pacemaker, Multi joint arthritis    Limitations  Standing;Walking    How long can you sit comfortably?  No limit     How long can you stand comfortably?  No limit     How long can you walk comfortably?  No limit     Diagnostic tests  Cervical X-ray: Mild multi joint degeneration     Patient Stated Goals  To have less pain in her neck     Currently in Pain?  No/denies    Pain Orientation  Right;Left    Pain Descriptors / Indicators  Aching    Pain Onset  More than a month ago    Pain Frequency  Constant    Aggravating Factors   turning head and looking down     Pain Relieving Factors  rest     Effect of Pain on Daily Activities  difficulty driving                       [facility]  Adult PT Treatment/Exercise - 05/30/17 0001      Neck Exercises: Seated   Other Seated Exercise  scap retraction 2x5 pain noted around her pacemaker ; cervical rotation 3x; reviewed how to use a thera-cane; tightness noted on the right side of the cerivcal spine     Other Seated Exercise  seated ER yellow  2x10 tightness noted; seated horizontal abdction 2x10 yellow       Modalities   Modalities  Moist Heat      Moist Heat Therapy   Moist Heat Location  Cervical      Manual Therapy   Manual therapy comments  gentle sub-occipital release on wedge; gentle manual traction on wedge. Seated IASTYM to bilateral upper traps,              PT Education - 05/30/17 1113    Education provided  Yes    Education Details  reviewed improtance of soft tissue mobiliztions.     Person(s) Educated  Patient    Methods  Explanation;Demonstration;Tactile cues;Verbal cues    Comprehension  Verbalized understanding;Returned demonstration       PT Short Term Goals - 05/23/17 0808      PT SHORT TERM GOAL #1    Title  Patient will increase builateral cervical rotation by 25 degrees     Time  4    Period  Weeks    Status  On-going    Target Date  05/22/17      PT SHORT TERM GOAL #2   Title  Patient will demsotrate full pain free cervical flexion     Time  4    Period  Weeks    Status  On-going      PT SHORT TERM GOAL #3   Title  Patient will demsogtrate 5/5 gross eright shoulder flexion     Time  4    Period  Weeks    Status  On-going      PT SHORT TERM GOAL #4   Title  Patient will report decreased tenderness to palpation in her right upper trap     Time  4    Period  Weeks    Status  On-going        PT Long Term Goals - 05/28/17 0754      PT LONG TERM GOAL #1   Title  Patient will be independent with exercise program to reduce spasming and inmprove posture    Time  8    Period  Weeks    Status  On-going      PT LONG TERM GOAL #2   Title  Patient will report no ram horn headaches for 1 week    Time  8    Period  Weeks    Status  On-going      PT LONG TERM GOAL #3   Title  Patient wilkl demsotrate 60 degrees of rotation bilateral without pain in order to drive safely     Time  8    Period  Weeks    Status  On-going            Plan - 05/30/17 1115    Clinical Impression Statement  Patient had tightneing of the right side with just basivc ther-ex. Even scapular retraction caused her pain. While postural correction remains one of the most improtant things for her for long term pain relief, she is having difficulty perfroming the exercises. Therapy will focus on manual therapy for a few visit. She was  advised to keep her head moving but hold on exercises for now.     Clinical Presentation  Evolving    Clinical Decision Making  Moderate    Rehab Potential  Good    PT Frequency  2x / week    PT Duration  8 weeks    PT Treatment/Interventions  ADLs/Self Care Home Management;Cryotherapy;Electrical Stimulation;Iontophoresis 4mg /ml Dexamethasone;Therapeutic  activities;Therapeutic exercise;Neuromuscular re-education;Patient/family education;Manual techniques;Splinting;Taping;Passive range of motion;Moist Heat;Traction;Ultrasound;Dry needling    PT Next Visit Plan  do manual work on wedge or sitting, light postrula exercises, when she sits in good postrue she gets pain from her pace maker; consdier light pec release; decompresion position as much as possble on the wedge; Continue manual therapy to upper traps and cervical paraspianls; review scpa retraction.  G-code and FOTO     PT Home Exercise Plan  scap retraction; cervical rotation; upper trap stretch    Consulted and Agree with Plan of Care  Patient       Patient will benefit from skilled therapeutic intervention in order to improve the following deficits and impairments:  Abnormal gait, Pain, Increased edema, Decreased strength, Impaired UE functional use, Decreased endurance, Decreased activity tolerance, Decreased range of motion, Postural dysfunction  Visit Diagnosis: Cervicalgia  Other muscle spasm     Problem List Patient Active Problem List   Diagnosis Date Noted  . 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 Atrial fibrillation ([place]) 11/17/2013  . Chronic systolic heart failure ([place]) 10/13/2013  . Hyponatremia 03/04/2013  . Constipation 09/13/2011  . GERD 09/13/2010  . HYPOTHYROIDISM 04/28/2009  . HYPERLIPIDEMIA 04/28/2009  . Hypertension 04/28/2009  . CARDIOMYOPATHY 04/28/2009  . LEFT BUNDLE BRANCH BLOCK 04/28/2009  . DEGENERATIVE JOINT DISEASE 04/28/2009    Carney Living PT DPT  05/30/2017, 12:57 PM  [facility] [address] [place], [name], [address] Phone: [phone]   Fax:  [phone]  Name: [name] MRN: [id number] Date of Birth: 1941/04/29

## 2017-05-31 ENCOUNTER — Ambulatory Visit: Payer: Medicare Other | Admitting: Physical Therapy

## 2017-05-31 DIAGNOSIS — M62838 Other muscle spasm: Secondary | ICD-10-CM

## 2017-05-31 DIAGNOSIS — M542 Cervicalgia: Secondary | ICD-10-CM

## 2017-05-31 NOTE — Therapy (Signed)
[place], [name], [address] Phone: [phone]   Fax:  [phone]  Physical Therapy Treatment  Patient Details  Name: [name] MRN: [id number] Date of Birth: 13-Aug-1940 Referring Provider: Dr Basil Dess   Encounter Date: 05/31/2017  PT End of Session - 05/31/17 1648    Visit Number  10    Number of Visits  16    Date for PT Re-Evaluation  06/19/17    Authorization Type  Medicare traid  G-code and re-assessment next visit     PT Start Time  1015    PT Stop Time  1055    PT Time Calculation (min)  40 min    Activity Tolerance  Patient tolerated treatment well    Behavior During Therapy  [facility] for tasks assessed/performed       Past Medical History:  Diagnosis Date  . 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 Cerebrovascular accident [facility])    No clinical event; asymptomatic CT finding  . DDD (degenerative disc disease), cervical    Cervical  . Degenerative joint disease    Right hip and knee  . GERD (gastroesophageal reflux disease)   . Hip dislocation, right ([place])   . Hyperlipemia   . Hypertension   . Hypothyroid   . LBBB (left bundle branch block)   . S/P colonoscopy 2009   Dr. Sharlett Iles: reportedly normal per pt  . S/P endoscopy 1980s   per pt; normal. performed secondary to reflux    Past Surgical History:  Procedure Laterality Date  . ABDOMINAL HYSTERECTOMY    . APPENDECTOMY    . BI-VENTRICULAR IMPLANTABLE CARDIOVERTER DEFIBRILLATOR  (CRT-D)  10-31-2013   MDT Hillery Aldo CRTD implanted by Dr Lovena Le  . BREAST BIOPSY     bilaterally; benign  . can not have MRI due to jaw surgery    . CHOLECYSTECTOMY    . COLONOSCOPY  2009  . ESOPHAGOGASTRODUODENOSCOPY  07/13/2011   small hh, patchy gastric erythema with scarring deformity of antrum, chronic gastritis, bx benign with no  H.Pylori. Procedure: ESOPHAGOGASTRODUODENOSCOPY (EGD);  Surgeon: Daneil Dolin, MD;  [facility];  Service: Endoscopy;  Laterality: N/A;  10:45  . IMPLANTABLE CARDIOVERTER DEFIBRILLATOR IMPLANT N/A 10/31/2013   Procedure: IMPLANTABLE CARDIOVERTER DEFIBRILLATOR IMPLANT;  Surgeon: Evans Lance, MD;  Location: [facility] CATH LAB;  Service: Cardiovascular;  Laterality: N/A;  . MANDIBLE SURGERY     secondary to malocclusion  . THYROIDECTOMY     neoplasm  . Ureteral Surgery  1970    There were no vitals filed for this visit.  Subjective Assessment - 05/31/17 1020    Subjective  Patient reports her neck remains sore. Her neck has been sore since the last visit.     Pertinent History  Pacemaker, Multi joint arthritis    Limitations  Standing;Walking    How long can you sit comfortably?  No limit     How long can you stand comfortably?  No limit     How long can you walk comfortably?  No limit     Diagnostic tests  Cervical X-ray: Mild multi joint degeneration     Patient Stated Goals  To have less pain in her neck     Currently in Pain?  Yes    Pain Score  7     Pain Location  Neck  Pain Orientation  Right;Left    Pain Descriptors / Indicators  Aching    Pain Onset  More than a month ago    Pain Frequency  Constant    Aggravating Factors   turning head and looking down     Pain Relieving Factors  rest     Effect of Pain on Daily Activities  difficulty driving     Multiple Pain Sites  No                      [facility] Adult PT Treatment/Exercise - 05/31/17 0001      Modalities   Modalities  Moist Heat      Moist Heat Therapy   Number Minutes Moist Heat  10 Minutes    Moist Heat Location  Cervical      Manual Therapy   Manual therapy comments  gentle sub-occipital release on wedge; gentle manual traction on wedge. Seated IASTYM to bilateral upper traps,              PT Education - 05/31/17 1647    Education provided  Yes    Education Details  reviewed  self soft tissue mobilization     Person(s) Educated  Patient    Methods  Explanation;Demonstration;Tactile cues;Verbal cues    Comprehension  Verbalized understanding;Returned demonstration       PT Short Term Goals - 05/23/17 0808      PT SHORT TERM GOAL #1   Title  Patient will increase builateral cervical rotation by 25 degrees     Time  4    Period  Weeks    Status  On-going    Target Date  05/22/17      PT SHORT TERM GOAL #2   Title  Patient will demsotrate full pain free cervical flexion     Time  4    Period  Weeks    Status  On-going      PT SHORT TERM GOAL #3   Title  Patient will demsogtrate 5/5 gross eright shoulder flexion     Time  4    Period  Weeks    Status  On-going      PT SHORT TERM GOAL #4   Title  Patient will report decreased tenderness to palpation in her right upper trap     Time  4    Period  Weeks    Status  On-going        PT Long Term Goals - 05/28/17 0754      PT LONG TERM GOAL #1   Title  Patient will be independent with exercise program to reduce spasming and inmprove posture    Time  8    Period  Weeks    Status  On-going      PT LONG TERM GOAL #2   Title  Patient will report no ram horn headaches for 1 week    Time  8    Period  Weeks    Status  On-going      PT LONG TERM GOAL #3   Title  Patient wilkl demsotrate 60 degrees of rotation bilateral without pain in order to drive safely     Time  8    Period  Weeks    Status  On-going            Plan - 05/31/17 1648    Clinical Impression Statement  Patient continues to be limited. she reported improved pain after treatment.  She feels the exercises have made it worst so the patient was advised not to do them. Therapy will continue if the patients symptoms continue to move in the right direction again.     Clinical Presentation  Evolving    Clinical Decision Making  Moderate    Rehab Potential  Good    PT Frequency  2x / week    PT Duration  8 weeks    PT  Treatment/Interventions  ADLs/Self Care Home Management;Cryotherapy;Electrical Stimulation;Iontophoresis 4mg /ml Dexamethasone;Therapeutic activities;Therapeutic exercise;Neuromuscular re-education;Patient/family education;Manual techniques;Splinting;Taping;Passive range of motion;Moist Heat;Traction;Ultrasound;Dry needling    PT Next Visit Plan  do manual work on wedge or sitting, light postrula exercises, when she sits in good postrue she gets pain from her pace maker; consdier light pec release; decompresion position as much as possble on the wedge; Continue manual therapy to upper traps and cervical paraspianls; review scpa retraction.  G-code and FOTO     PT Home Exercise Plan  scap retraction; cervical rotation; upper trap stretch    Consulted and Agree with Plan of Care  Patient       Patient will benefit from skilled therapeutic intervention in order to improve the following deficits and impairments:  Abnormal gait, Pain, Increased edema, Decreased strength, Impaired UE functional use, Decreased endurance, Decreased activity tolerance, Decreased range of motion, Postural dysfunction  Visit Diagnosis: Cervicalgia  Other muscle spasm   G-Codes - 06-01-2017 1651    Functional Assessment Tool Used (Outpatient Only)  FOTO, clinical decision making     Functional Limitation  Changing and maintaining body position    Changing and Maintaining Body Position Current Status (R5188)  At least 40 percent but less than 60 percent impaired, limited or restricted    Changing and Maintaining Body Position Goal Status (C1660)  At least 40 percent but less than 60 percent impaired, limited or restricted       Problem List Patient Active Problem List   Diagnosis Date Noted  . 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 Atrial fibrillation ([place]) 11/17/2013  . Chronic systolic heart failure ([place]) 10/13/2013  . Hyponatremia 03/04/2013  . Constipation 09/13/2011  .  GERD 09/13/2010  . HYPOTHYROIDISM 04/28/2009  . HYPERLIPIDEMIA 04/28/2009  . Hypertension 04/28/2009  . CARDIOMYOPATHY 04/28/2009  . LEFT BUNDLE BRANCH BLOCK 04/28/2009  . DEGENERATIVE JOINT DISEASE 04/28/2009    Carney Living PT DPT  01-Jun-2017, 4:53 PM  [facility] [address] [place], [name], [address] Phone: [phone]   Fax:  [phone]  Name: [name] MRN: [id number] Date of Birth: March 09, 1941

## 2017-06-05 ENCOUNTER — Ambulatory Visit: Payer: Medicare Other | Admitting: Physical Therapy

## 2017-06-05 ENCOUNTER — Encounter: Payer: Self-pay | Admitting: Physical Therapy

## 2017-06-05 DIAGNOSIS — M542 Cervicalgia: Secondary | ICD-10-CM

## 2017-06-05 DIAGNOSIS — M62838 Other muscle spasm: Secondary | ICD-10-CM

## 2017-06-05 NOTE — Therapy (Signed)
[name], [name], [address] Phone: [phone]   Fax:  [phone]  Physical Therapy Treatment  Patient Details  Name: [name] MRN: [id number] Date of Birth: 02-09-1941 Referring Provider: Dr Basil Dess   Encounter Date: 06/05/2017  PT End of Session - 06/05/17 1335    Visit Number  11    Number of Visits  16    Date for PT Re-Evaluation  06/19/17    Authorization Type  Medicare traid  G-code and re-assessment next visit     PT Start Time  1015    PT Stop Time  1054    PT Time Calculation (min)  39 min    Activity Tolerance  Patient tolerated treatment well    Behavior During Therapy  [facility] for tasks assessed/performed       Past Medical History:  Diagnosis Date  . 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 Cerebrovascular accident [facility])    No clinical event; asymptomatic CT finding  . DDD (degenerative disc disease), cervical    Cervical  . Degenerative joint disease    Right hip and knee  . GERD (gastroesophageal reflux disease)   . Hip dislocation, right ([place])   . Hyperlipemia   . Hypertension   . Hypothyroid   . LBBB (left bundle branch block)   . S/P colonoscopy 2009   Dr. Sharlett Iles: reportedly normal per pt  . S/P endoscopy 1980s   per pt; normal. performed secondary to reflux    Past Surgical History:  Procedure Laterality Date  . ABDOMINAL HYSTERECTOMY    . APPENDECTOMY    . BI-VENTRICULAR IMPLANTABLE CARDIOVERTER DEFIBRILLATOR  (CRT-D)  10-31-2013   MDT Hillery Aldo CRTD implanted by Dr Lovena Le  . BREAST BIOPSY     bilaterally; benign  . can not have MRI due to jaw surgery    . CHOLECYSTECTOMY    . COLONOSCOPY  2009  . ESOPHAGOGASTRODUODENOSCOPY  07/13/2011   small hh, patchy gastric erythema with scarring deformity of antrum, chronic gastritis, bx benign with no  H.Pylori. Procedure: ESOPHAGOGASTRODUODENOSCOPY (EGD);  Surgeon: Daneil Dolin, MD;  [facility];  Service: Endoscopy;  Laterality: N/A;  10:45  . IMPLANTABLE CARDIOVERTER DEFIBRILLATOR IMPLANT N/A 10/31/2013   Procedure: IMPLANTABLE CARDIOVERTER DEFIBRILLATOR IMPLANT;  Surgeon: Evans Lance, MD;  Location: [facility] CATH LAB;  Service: Cardiovascular;  Laterality: N/A;  . MANDIBLE SURGERY     secondary to malocclusion  . THYROIDECTOMY     neoplasm  . Ureteral Surgery  1970    There were no vitals filed for this visit.  Subjective Assessment - 06/05/17 1019    Subjective  Patients neck has been painful at times but times it is not painful. Her pain today is about a 5/10 when she truns her head. She has had good days and bad days. Idf she sits without turning her pain improves. The pain is mosty on the right but it ion the left a little as well.     Pertinent History  Pacemaker, Multi joint arthritis    Limitations  Standing;Walking    How long can you sit comfortably?  No limit     How long can you stand comfortably?  No limit     How long can you walk comfortably?  No limit     Diagnostic tests  Cervical X-ray: Mild multi joint  degeneration     Patient Stated Goals  To have less pain in her neck     Currently in Pain?  Yes                              PT Education - 06/05/17 1335    Education provided  Yes    Education Details  reviewed soft tissue mobilization for the weekend     Person(s) Educated  Patient    Methods  Explanation;Demonstration;Tactile cues;Verbal cues    Comprehension  Verbalized understanding;Returned demonstration;Verbal cues required;Tactile cues required       PT Short Term Goals - 06/05/17 1405      PT SHORT TERM GOAL #1   Title  Patient will increase builateral cervical rotation by 25 degrees     Time  4    Period  Weeks    Status  On-going      PT SHORT TERM GOAL #2   Title  Patient will demsotrate full pain free  cervical flexion     Time  4    Period  Weeks    Status  On-going      PT SHORT TERM GOAL #3   Title  Patient will demsogtrate 5/5 gross eright shoulder flexion     Time  4    Period  Weeks    Status  On-going      PT SHORT TERM GOAL #4   Title  Patient will report decreased tenderness to palpation in her right upper trap     Time  4    Period  Weeks    Status  On-going        PT Long Term Goals - 05/28/17 0754      PT LONG TERM GOAL #1   Title  Patient will be independent with exercise program to reduce spasming and inmprove posture    Time  8    Period  Weeks    Status  On-going      PT LONG TERM GOAL #2   Title  Patient will report no ram horn headaches for 1 week    Time  8    Period  Weeks    Status  On-going      PT LONG TERM GOAL #3   Title  Patient wilkl demsotrate 60 degrees of rotation bilateral without pain in order to drive safely     Time  8    Period  Weeks    Status  On-going            Plan - 06/05/17 1336    Clinical Impression Statement  Patient appears to be making slow progress after her most recent setback. She was advised to continue working on just light activity.     Clinical Presentation  Evolving    Clinical Decision Making  Moderate    Rehab Potential  Good    PT Frequency  2x / week    PT Duration  8 weeks    PT Treatment/Interventions  ADLs/Self Care Home Management;Cryotherapy;Electrical Stimulation;Iontophoresis 4mg /ml Dexamethasone;Therapeutic activities;Therapeutic exercise;Neuromuscular re-education;Patient/family education;Manual techniques;Splinting;Taping;Passive range of motion;Moist Heat;Traction;Ultrasound;Dry needling    PT Next Visit Plan  do manual work on wedge or sitting, light postrula exercises, when she sits in good postrue she gets pain from her pace maker; consdier light pec release; decompresion position as much as possble on the wedge; Continue manual therapy to upper traps and cervical paraspianls; review  scpa  retraction.  G-code and FOTO     PT Home Exercise Plan  scap retraction; cervical rotation; upper trap stretch    Consulted and Agree with Plan of Care  Patient       Patient will benefit from skilled therapeutic intervention in order to improve the following deficits and impairments:  Abnormal gait, Pain, Increased edema, Decreased strength, Impaired UE functional use, Decreased endurance, Decreased activity tolerance, Decreased range of motion, Postural dysfunction  Visit Diagnosis: Cervicalgia  Other muscle spasm     Problem List Patient Active Problem List   Diagnosis Date Noted  . 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 Atrial fibrillation ([place]) 11/17/2013  . Chronic systolic heart failure ([place]) 10/13/2013  . Hyponatremia 03/04/2013  . Constipation 09/13/2011  . GERD 09/13/2010  . HYPOTHYROIDISM 04/28/2009  . HYPERLIPIDEMIA 04/28/2009  . Hypertension 04/28/2009  . CARDIOMYOPATHY 04/28/2009  . LEFT BUNDLE BRANCH BLOCK 04/28/2009  . DEGENERATIVE JOINT DISEASE 04/28/2009    Carney Living PT DPT  06/05/2017, 2:06 PM  [facility] [address] [place], [name], [address] Phone: [phone]   Fax:  [phone]  Name: [name] MRN: [id number] Date of Birth: [birth year]

## 2017-06-12 ENCOUNTER — Ambulatory Visit: Payer: Medicare Other | Admitting: Physical Therapy

## 2017-06-12 DIAGNOSIS — M542 Cervicalgia: Secondary | ICD-10-CM | POA: Diagnosis not present

## 2017-06-12 DIAGNOSIS — M62838 Other muscle spasm: Secondary | ICD-10-CM

## 2017-06-13 NOTE — Therapy (Signed)
[place], [name], [address] Phone: [phone]   Fax:  [phone]  Physical Therapy Treatment  Patient Details  Name: [name] MRN: [id number] Date of Birth: [birth year] Referring Provider: Dr Basil Dess   Encounter Date: 06/12/2017  PT End of Session - 06/12/17 1123    Visit Number  12    Number of Visits  16    Date for PT Re-Evaluation  06/19/17    Authorization Type  Medicare traid  G-code and re-assessment next visit     PT Start Time  1015    PT Stop Time  1104    PT Time Calculation (min)  49 min    Activity Tolerance  Patient tolerated treatment well    Behavior During Therapy  [facility] for tasks assessed/performed       Past Medical History:  Diagnosis Date  . 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 Cerebrovascular accident [facility])    No clinical event; asymptomatic CT finding  . DDD (degenerative disc disease), cervical    Cervical  . Degenerative joint disease    Right hip and knee  . GERD (gastroesophageal reflux disease)   . Hip dislocation, right ([place])   . Hyperlipemia   . Hypertension   . Hypothyroid   . LBBB (left bundle branch block)   . S/P colonoscopy 2009   Dr. Sharlett Iles: reportedly normal per pt  . S/P endoscopy 1980s   per pt; normal. performed secondary to reflux    Past Surgical History:  Procedure Laterality Date  . ABDOMINAL HYSTERECTOMY    . APPENDECTOMY    . BI-VENTRICULAR IMPLANTABLE CARDIOVERTER DEFIBRILLATOR  (CRT-D)  10-31-2013   MDT Hillery Aldo CRTD implanted by Dr Lovena Le  . BREAST BIOPSY     bilaterally; benign  . can not have MRI due to jaw surgery    . CHOLECYSTECTOMY    . COLONOSCOPY  2009  . ESOPHAGOGASTRODUODENOSCOPY  07/13/2011   small hh, patchy gastric erythema with scarring deformity of antrum, chronic gastritis, bx benign with no  H.Pylori. Procedure: ESOPHAGOGASTRODUODENOSCOPY (EGD);  Surgeon: Daneil Dolin, MD;  [facility];  Service: Endoscopy;  Laterality: N/A;  10:45  . IMPLANTABLE CARDIOVERTER DEFIBRILLATOR IMPLANT N/A 10/31/2013   Procedure: IMPLANTABLE CARDIOVERTER DEFIBRILLATOR IMPLANT;  Surgeon: Evans Lance, MD;  Location: [facility] CATH LAB;  Service: Cardiovascular;  Laterality: N/A;  . MANDIBLE SURGERY     secondary to malocclusion  . THYROIDECTOMY     neoplasm  . Ureteral Surgery  1970    There were no vitals filed for this visit.  Subjective Assessment - 06/12/17 1120    Subjective  Patient reports her pain comes and goes. She has been having pain at night when she settles down.     Pertinent History  Pacemaker, Multi joint arthritis    Limitations  Standing;Walking    How long can you sit comfortably?  No limit     How long can you stand comfortably?  No limit     How long can you walk comfortably?  No limit     Diagnostic tests  Cervical X-ray: Mild multi joint degeneration     Patient Stated Goals  To have less pain in her neck     Pain Score  5     Pain Location  Neck    Pain Orientation  Right  Pain Descriptors / Indicators  Aching;Constant    Pain Type  Chronic pain    Pain Onset  More than a month ago    Pain Frequency  Constant    Aggravating Factors   turning head and looking down; using the phone     Pain Relieving Factors  rest                       [facility] Adult PT Treatment/Exercise - 06/13/17 0001      Neck Exercises: Seated   Other Seated Exercise  attmeoted scpa retraction but patient had tension in her upper trap and scalene area.       Modalities   Modalities  Moist Heat      Moist Heat Therapy   Moist Heat Location  Cervical      Manual Therapy   Manual therapy comments  gentle sub-occipital release on wedge; gentle manual traction on wedge. Seated IASTYM to bilateral upper traps, Focused              PT Education - 06/12/17 1122     Education provided  Yes    Education Details  postrue and use of the phone     Person(s) Educated  Patient    Methods  Explanation;Tactile cues;Demonstration;Verbal cues    Comprehension  Need further instruction;Verbalized understanding;Returned demonstration       PT Short Term Goals - 06/05/17 1405      PT SHORT TERM GOAL #1   Title  Patient will increase builateral cervical rotation by 25 degrees     Time  4    Period  Weeks    Status  On-going      PT SHORT TERM GOAL #2   Title  Patient will demsotrate full pain free cervical flexion     Time  4    Period  Weeks    Status  On-going      PT SHORT TERM GOAL #3   Title  Patient will demsogtrate 5/5 gross eright shoulder flexion     Time  4    Period  Weeks    Status  On-going      PT SHORT TERM GOAL #4   Title  Patient will report decreased tenderness to palpation in her right upper trap     Time  4    Period  Weeks    Status  On-going        PT Long Term Goals - 05/28/17 0754      PT LONG TERM GOAL #1   Title  Patient will be independent with exercise program to reduce spasming and inmprove posture    Time  8    Period  Weeks    Status  On-going      PT LONG TERM GOAL #2   Title  Patient will report no ram horn headaches for 1 week    Time  8    Period  Weeks    Status  On-going      PT LONG TERM GOAL #3   Title  Patient wilkl demsotrate 60 degrees of rotation bilateral without pain in order to drive safely     Time  8    Period  Weeks    Status  On-going            Plan - 06/13/17 0849    Clinical Impression Statement  Patient continues to have frequent setbacks. Therapy tried to add exercises back  in but she continued to have a quick return of her symptoms. Without postural correction her improvements may not be something that lasts but she has a quick increase in pain with light activity. Therapy will continue to try to work on strengthening exercises.     Clinical Presentation  Evolving     Clinical Decision Making  Moderate    Rehab Potential  Good    PT Frequency  2x / week    PT Duration  8 weeks    PT Treatment/Interventions  ADLs/Self Care Home Management;Cryotherapy;Electrical Stimulation;Iontophoresis 4mg /ml Dexamethasone;Therapeutic activities;Therapeutic exercise;Neuromuscular re-education;Patient/family education;Manual techniques;Splinting;Taping;Passive range of motion;Moist Heat;Traction;Ultrasound;Dry needling    PT Next Visit Plan  do manual work on wedge or sitting, light postrula exercises, when she sits in good postrue she gets pain from her pace maker; consdier light pec release; decompresion position as much as possble on the wedge; Continue manual therapy to upper traps and cervical paraspianls; review scpa retraction.  G-code and FOTO     PT Home Exercise Plan  scap retraction; cervical rotation; upper trap stretch    Consulted and Agree with Plan of Care  Patient       Patient will benefit from skilled therapeutic intervention in order to improve the following deficits and impairments:  Abnormal gait, Pain, Increased edema, Decreased strength, Impaired UE functional use, Decreased endurance, Decreased activity tolerance, Decreased range of motion, Postural dysfunction  Visit Diagnosis: Cervicalgia  Other muscle spasm     Problem List Patient Active Problem List   Diagnosis Date Noted  . 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 Atrial fibrillation ([place]) 11/17/2013  . Chronic systolic heart failure ([place]) 10/13/2013  . Hyponatremia 03/04/2013  . Constipation 09/13/2011  . GERD 09/13/2010  . HYPOTHYROIDISM 04/28/2009  . HYPERLIPIDEMIA 04/28/2009  . Hypertension 04/28/2009  . CARDIOMYOPATHY 04/28/2009  . LEFT BUNDLE BRANCH BLOCK 04/28/2009  . DEGENERATIVE JOINT DISEASE 04/28/2009    Carney Living PT DPT  06/13/2017, 11:21 AM  [facility] [address] [place], [name], [address] Phone: [phone]   Fax:  [phone]  Name: [name] MRN: [id number] Date of Birth: 10/23/1940

## 2017-06-14 ENCOUNTER — Encounter: Payer: Medicare Other | Admitting: Physical Therapy

## 2017-06-15 ENCOUNTER — Ambulatory Visit: Payer: Medicare Other | Admitting: Physical Therapy

## 2017-06-15 DIAGNOSIS — M542 Cervicalgia: Secondary | ICD-10-CM

## 2017-06-15 DIAGNOSIS — M62838 Other muscle spasm: Secondary | ICD-10-CM | POA: Diagnosis not present

## 2017-06-18 ENCOUNTER — Other Ambulatory Visit: Payer: Self-pay | Admitting: Internal Medicine

## 2017-06-18 ENCOUNTER — Other Ambulatory Visit: Payer: Self-pay | Admitting: Cardiovascular Disease

## 2017-06-18 ENCOUNTER — Encounter: Payer: Self-pay | Admitting: Physical Therapy

## 2017-06-18 NOTE — Therapy (Signed)
[place], [name], [address] Phone: [phone]   Fax:  [phone]  Physical Therapy Treatment  Patient Details  Name: [name] MRN: [id number] Date of Birth: Jun 17, 1941 Referring Provider: Dr Basil Dess   Encounter Date: 06/15/2017  PT End of Session - 06/18/17 1732    Visit Number  13    Number of Visits  16    Date for PT Re-Evaluation  06/19/17    Authorization Type  Medicare traid  G-code and re-assessment next visit     PT Start Time  1015    PT Stop Time  1106    PT Time Calculation (min)  51 min    Activity Tolerance  Patient tolerated treatment well    Behavior During Therapy  [facility] for tasks assessed/performed       Past Medical History:  Diagnosis Date  . 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 Cerebrovascular accident [facility])    No clinical event; asymptomatic CT finding  . DDD (degenerative disc disease), cervical    Cervical  . Degenerative joint disease    Right hip and knee  . GERD (gastroesophageal reflux disease)   . Hip dislocation, right ([place])   . Hyperlipemia   . Hypertension   . Hypothyroid   . LBBB (left bundle branch block)   . S/P colonoscopy 2009   Dr. Sharlett Iles: reportedly normal per pt  . S/P endoscopy 1980s   per pt; normal. performed secondary to reflux    Past Surgical History:  Procedure Laterality Date  . ABDOMINAL HYSTERECTOMY    . APPENDECTOMY    . BI-VENTRICULAR IMPLANTABLE CARDIOVERTER DEFIBRILLATOR  (CRT-D)  10-31-2013   MDT Hillery Aldo CRTD implanted by Dr Lovena Le  . BREAST BIOPSY     bilaterally; benign  . can not have MRI due to jaw surgery    . CHOLECYSTECTOMY    . COLONOSCOPY  2009  . ESOPHAGOGASTRODUODENOSCOPY  07/13/2011   small hh, patchy gastric erythema with scarring deformity of antrum, chronic gastritis, bx benign with no  H.Pylori. Procedure: ESOPHAGOGASTRODUODENOSCOPY (EGD);  Surgeon: Daneil Dolin, MD;  [facility];  Service: Endoscopy;  Laterality: N/A;  10:45  . IMPLANTABLE CARDIOVERTER DEFIBRILLATOR IMPLANT N/A 10/31/2013   Procedure: IMPLANTABLE CARDIOVERTER DEFIBRILLATOR IMPLANT;  Surgeon: Evans Lance, MD;  Location: [facility] C[facility] LAB;  Service: Cardiovascular;  Laterality: N/A;  . MANDIBLE SURGERY     secondary to malocclusion  . THYROIDECTOMY     neoplasm  . Ureteral Surgery  1970    There were no vitals filed for this visit.  Subjective Assessment - 06/18/17 1731    Subjective  Patient continues to report pain in the neck.     Pertinent History  Pacemaker, Multi joint arthritis    Limitations  Standing;Walking    How long can you sit comfortably?  No limit     How long can you stand comfortably?  No limit     How long can you walk comfortably?  No limit     Diagnostic tests  Cervical X-ray: Mild multi joint degeneration     Patient Stated Goals  To have less pain in her neck                       [facility] Adult PT Treatment/Exercise - 06/18/17 0001  Modalities   Modalities  Moist Heat      Moist Heat Therapy   Number Minutes Moist Heat  10 Minutes    Moist Heat Location  Cervical      Manual Therapy   Manual therapy comments  gentle sub-occipital release on wedge; gentle manual traction on wedge. Seated IASTYM to bilateral upper traps, Focused              PT Education - 06/18/17 1732    Education provided  Yes    Education Details  reviewed ther-ex     Person(s) Educated  Patient    Methods  Explanation;Demonstration;Tactile cues;Verbal cues    Comprehension  Verbalized understanding;Returned demonstration       PT Short Term Goals - 06/05/17 1405      PT SHORT TERM GOAL #1   Title  Patient will increase builateral cervical rotation by 25 degrees     Time  4    Period  Weeks    Status  On-going      PT SHORT TERM GOAL #2   Title  Patient  will demsotrate full pain free cervical flexion     Time  4    Period  Weeks    Status  On-going      PT SHORT TERM GOAL #3   Title  Patient will demsogtrate 5/5 gross eright shoulder flexion     Time  4    Period  Weeks    Status  On-going      PT SHORT TERM GOAL #4   Title  Patient will report decreased tenderness to palpation in her right upper trap     Time  4    Period  Weeks    Status  On-going        PT Long Term Goals - 05/28/17 0754      PT LONG TERM GOAL #1   Title  Patient will be independent with exercise program to reduce spasming and inmprove posture    Time  8    Period  Weeks    Status  On-going      PT LONG TERM GOAL #2   Title  Patient will report no ram horn headaches for 1 week    Time  8    Period  Weeks    Status  On-going      PT LONG TERM GOAL #3   Title  Patient wilkl demsotrate 60 degrees of rotation bilateral without pain in order to drive safely     Time  8    Period  Weeks    Status  On-going            Plan - 06/18/17 1733    Clinical Impression Statement  Therapy continues to focus on manual therapy. She reports it is improving. Will have to progress exercises as able.     Clinical Presentation  Evolving    Clinical Decision Making  Moderate    Rehab Potential  Good    PT Frequency  2x / week    PT Duration  8 weeks    PT Treatment/Interventions  ADLs/Self Care Home Management;Cryotherapy;Electrical Stimulation;Iontophoresis 4mg /ml Dexamethasone;Therapeutic activities;Therapeutic exercise;Neuromuscular re-education;Patient/family education;Manual techniques;Splinting;Taping;Passive range of motion;Moist Heat;Traction;Ultrasound;Dry needling    PT Next Visit Plan  do manual work on wedge or sitting, light postrula exercises, when she sits in good postrue she gets pain from her pace maker; consdier light pec release; decompresion position as much as possble on the wedge; Continue  manual therapy to upper traps and cervical  paraspianls; review scpa retraction.  G-code and FOTO     PT Home Exercise Plan  scap retraction; cervical rotation; upper trap stretch    Consulted and Agree with Plan of Care  Patient       Patient will benefit from skilled therapeutic intervention in order to improve the following deficits and impairments:  Abnormal gait, Pain, Increased edema, Decreased strength, Impaired UE functional use, Decreased endurance, Decreased activity tolerance, Decreased range of motion, Postural dysfunction  Visit Diagnosis: Cervicalgia  Other muscle spasm     Problem List Patient Active Problem List   Diagnosis Date Noted  . 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 Atrial fibrillation ([place]) 11/17/2013  . Chronic systolic heart failure ([place]) 10/13/2013  . Hyponatremia 03/04/2013  . Constipation 09/13/2011  . GERD 09/13/2010  . HYPOTHYROIDISM 04/28/2009  . HYPERLIPIDEMIA 04/28/2009  . Hypertension 04/28/2009  . CARDIOMYOPATHY 04/28/2009  . LEFT BUNDLE BRANCH BLOCK 04/28/2009  . DEGENERATIVE JOINT DISEASE 04/28/2009    Carney Living PT DPT  06/18/2017, 5:36 PM  [facility] [address] [place], [name], [address] Phone: [phone]   Fax:  [phone]  Name: [name] MRN: [id number] Date of Birth: 02/15/1941

## 2017-06-19 ENCOUNTER — Ambulatory Visit: Payer: Medicare Other | Attending: Specialist | Admitting: Physical Therapy

## 2017-06-19 ENCOUNTER — Encounter: Payer: Self-pay | Admitting: Physical Therapy

## 2017-06-19 DIAGNOSIS — M62838 Other muscle spasm: Secondary | ICD-10-CM | POA: Diagnosis not present

## 2017-06-19 DIAGNOSIS — M542 Cervicalgia: Secondary | ICD-10-CM | POA: Diagnosis not present

## 2017-06-19 NOTE — Therapy (Signed)
[place], [name], [address] Phone: [phone]   Fax:  [phone]  Physical Therapy Treatment  Patient Details  Name: [name] MRN: [id number] Date of Birth: [birth year] Referring Provider: Dr Basil Dess   Encounter Date: 06/19/2017  PT End of Session - 06/19/17 2111    Visit Number  14    Number of Visits  16    Date for PT Re-Evaluation  06/19/17    Authorization Type  Medicare traid  G-code and re-assessment next visit     PT Start Time  1015    PT Stop Time  1107    PT Time Calculation (min)  52 min    Activity Tolerance  Patient tolerated treatment well    Behavior During Therapy  [facility] for tasks assessed/performed       Past Medical History:  Diagnosis Date  . 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 Cerebrovascular accident [facility])    No clinical event; asymptomatic CT finding  . DDD (degenerative disc disease), cervical    Cervical  . Degenerative joint disease    Right hip and knee  . GERD (gastroesophageal reflux disease)   . Hip dislocation, right ([place])   . Hyperlipemia   . Hypertension   . Hypothyroid   . LBBB (left bundle branch block)   . S/P colonoscopy 2009   Dr. Sharlett Iles: reportedly normal per pt  . S/P endoscopy 1980s   per pt; normal. performed secondary to reflux    Past Surgical History:  Procedure Laterality Date  . ABDOMINAL HYSTERECTOMY    . APPENDECTOMY    . BI-VENTRICULAR IMPLANTABLE CARDIOVERTER DEFIBRILLATOR  (CRT-D)  10-31-2013   MDT Hillery Aldo CRTD implanted by Dr Lovena Le  . BREAST BIOPSY     bilaterally; benign  . can not have MRI due to jaw surgery    . CHOLECYSTECTOMY    . COLONOSCOPY  2009  . ESOPHAGOGASTRODUODENOSCOPY  07/13/2011   small hh, patchy gastric erythema with scarring deformity of antrum, chronic gastritis, bx benign with no  H.Pylori. Procedure: ESOPHAGOGASTRODUODENOSCOPY (EGD);  Surgeon: Daneil Dolin, MD;  [facility];  Service: Endoscopy;  Laterality: N/A;  10:45  . IMPLANTABLE CARDIOVERTER DEFIBRILLATOR IMPLANT N/A 10/31/2013   Procedure: IMPLANTABLE CARDIOVERTER DEFIBRILLATOR IMPLANT;  Surgeon: Evans Lance, MD;  Location: [facility] CATH LAB;  Service: Cardiovascular;  Laterality: N/A;  . MANDIBLE SURGERY     secondary to malocclusion  . THYROIDECTOMY     neoplasm  . Ureteral Surgery  1970    There were no vitals filed for this visit.  Subjective Assessment - 06/19/17 2106    Subjective  Patient continues to have pain in her neck at night. Overall over the past few visits she feels like she is having less pain during the day. She was encouraged to try different pillow combinations at home.     Diagnostic tests  Cervical X-ray: Mild multi joint degeneration     Patient Stated Goals  To have less pain in her neck     Currently in Pain?  Yes    Pain Score  2     Pain Location  Neck    Pain Orientation  Right    Pain Descriptors / Indicators  Aching;Constant    Pain Type  Chronic pain    Pain Onset  More than a month ago  Pain Frequency  Constant    Aggravating Factors   turning heads and looking down    Pain Relieving Factors  rest     Effect of Pain on Daily Activities  difficulty driving                       [facility] Adult PT Treatment/Exercise - 06/19/17 0001      Modalities   Modalities  Moist Heat      Moist Heat Therapy   Number Minutes Moist Heat  10 Minutes    Moist Heat Location  Cervical      Manual Therapy   Manual therapy comments  gentle sub-occipital release on wedge; gentle manual traction on wedge. Seated IASTYM to bilateral upper traps, Focused              PT Education - 06/19/17 2111    Education provided  Yes    Education Details  reviewed improtance of posture     Person(s) Educated  Patient    Methods  Explanation;Demonstration;Tactile  cues;Verbal cues    Comprehension  Verbalized understanding;Returned demonstration;Verbal cues required;Tactile cues required       PT Short Term Goals - 06/05/17 1405      PT SHORT TERM GOAL #1   Title  Patient will increase builateral cervical rotation by 25 degrees     Time  4    Period  Weeks    Status  On-going      PT SHORT TERM GOAL #2   Title  Patient will demsotrate full pain free cervical flexion     Time  4    Period  Weeks    Status  On-going      PT SHORT TERM GOAL #3   Title  Patient will demsogtrate 5/5 gross eright shoulder flexion     Time  4    Period  Weeks    Status  On-going      PT SHORT TERM GOAL #4   Title  Patient will report decreased tenderness to palpation in her right upper trap     Time  4    Period  Weeks    Status  On-going        PT Long Term Goals - 05/28/17 0754      PT LONG TERM GOAL #1   Title  Patient will be independent with exercise program to reduce spasming and inmprove posture    Time  8    Period  Weeks    Status  On-going      PT LONG TERM GOAL #2   Title  Patient will report no ram horn headaches for 1 week    Time  8    Period  Weeks    Status  On-going      PT LONG TERM GOAL #3   Title  Patient wilkl demsotrate 60 degrees of rotation bilateral without pain in order to drive safely     Time  8    Period  Weeks    Status  On-going            Plan - 06/19/17 2113    Clinical Impression Statement  Therapy continues to work on manual therapy. she continues to have very tight scalenes. Therapy will continue manual traction to try to reduce tension on the spine.     Clinical Presentation  Evolving    Clinical Decision Making  Moderate    Rehab Potential  Good  PT Frequency  2x / week    PT Duration  8 weeks    PT Treatment/Interventions  ADLs/Self Care Home Management;Cryotherapy;Electrical Stimulation;Iontophoresis 4mg /ml Dexamethasone;Therapeutic activities;Therapeutic exercise;Neuromuscular  re-education;Patient/family education;Manual techniques;Splinting;Taping;Passive range of motion;Moist Heat;Traction;Ultrasound;Dry needling    PT Next Visit Plan  do manual work on wedge or sitting, light postrula exercises, when she sits in good postrue she gets pain from her pace maker; consdier light pec release; decompresion position as much as possble on the wedge; Continue manual therapy to upper traps and cervical paraspianls; review scpa retraction.  G-code and FOTO     PT Home Exercise Plan  scap retraction; cervical rotation; upper trap stretch    Consulted and Agree with Plan of Care  Patient       Patient will benefit from skilled therapeutic intervention in order to improve the following deficits and impairments:  Abnormal gait, Pain, Increased edema, Decreased strength, Impaired UE functional use, Decreased endurance, Decreased activity tolerance, Decreased range of motion, Postural dysfunction  Visit Diagnosis: Cervicalgia  Other muscle spasm     Problem List Patient Active Problem List   Diagnosis Date Noted  . 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 Atrial fibrillation ([place]) 11/17/2013  . Chronic systolic heart failure ([place]) 10/13/2013  . Hyponatremia 03/04/2013  . Constipation 09/13/2011  . GERD 09/13/2010  . HYPOTHYROIDISM 04/28/2009  . HYPERLIPIDEMIA 04/28/2009  . Hypertension 04/28/2009  . CARDIOMYOPATHY 04/28/2009  . LEFT BUNDLE BRANCH BLOCK 04/28/2009  . DEGENERATIVE JOINT DISEASE 04/28/2009    Carney Living  PT DPT  06/19/2017, 9:17 PM  [facility] [address] [place], [name], [address] Phone: [phone]   Fax:  [phone]  Name: [name] MRN: [id number] Date of Birth: July 15, 1941

## 2017-06-21 ENCOUNTER — Ambulatory Visit (INDEPENDENT_AMBULATORY_CARE_PROVIDER_SITE_OTHER): Payer: Medicare Other | Admitting: *Deleted

## 2017-06-21 DIAGNOSIS — I420 Dilated cardiomyopathy: Secondary | ICD-10-CM | POA: Diagnosis not present

## 2017-06-21 NOTE — Progress Notes (Signed)
Remote ICD transmission.   

## 2017-06-22 ENCOUNTER — Ambulatory Visit: Payer: Medicare Other | Admitting: Physical Therapy

## 2017-06-22 DIAGNOSIS — M62838 Other muscle spasm: Secondary | ICD-10-CM

## 2017-06-22 DIAGNOSIS — M542 Cervicalgia: Secondary | ICD-10-CM | POA: Diagnosis not present

## 2017-06-22 NOTE — Therapy (Signed)
[place], [name], [address] Phone: [phone]   Fax:  [phone]  Physical Therapy Treatment  Patient Details  Name: [name] MRN: [id number] Date of Birth: [birth year] Referring Provider: Dr Basil Dess   Encounter Date: 06/22/2017  PT End of Session - 06/22/17 1020    Visit Number  15    Number of Visits  16    Date for PT Re-Evaluation  07/20/17    PT Start Time  1017    PT Stop Time  1100    PT Time Calculation (min)  43 min    Activity Tolerance  Patient tolerated treatment well    Behavior During Therapy  [facility] for tasks assessed/performed       Past Medical History:  Diagnosis Date  . 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 Cerebrovascular accident [facility])    No clinical event; asymptomatic CT finding  . DDD (degenerative disc disease), cervical    Cervical  . Degenerative joint disease    Right hip and knee  . GERD (gastroesophageal reflux disease)   . Hip dislocation, right ([place])   . Hyperlipemia   . Hypertension   . Hypothyroid   . LBBB (left bundle branch block)   . S/P colonoscopy 2009   Dr. Sharlett Iles: reportedly normal per pt  . S/P endoscopy 1980s   per pt; normal. performed secondary to reflux    Past Surgical History:  Procedure Laterality Date  . ABDOMINAL HYSTERECTOMY    . APPENDECTOMY    . BI-VENTRICULAR IMPLANTABLE CARDIOVERTER DEFIBRILLATOR  (CRT-D)  10-31-2013   MDT Hillery Aldo CRTD implanted by Dr Lovena Le  . BREAST BIOPSY     bilaterally; benign  . can not have MRI due to jaw surgery    . CHOLECYSTECTOMY    . COLONOSCOPY  2009  . ESOPHAGOGASTRODUODENOSCOPY  07/13/2011   small hh, patchy gastric erythema with scarring deformity of antrum, chronic gastritis, bx benign with no H.Pylori. Procedure: ESOPHAGOGASTRODUODENOSCOPY (EGD);  Surgeon: Daneil Dolin,  MD;  [facility];  Service: Endoscopy;  Laterality: N/A;  10:45  . IMPLANTABLE CARDIOVERTER DEFIBRILLATOR IMPLANT N/A 10/31/2013   Procedure: IMPLANTABLE CARDIOVERTER DEFIBRILLATOR IMPLANT;  Surgeon: Evans Lance, MD;  Location: [facility] CATH LAB;  Service: Cardiovascular;  Laterality: N/A;  . MANDIBLE SURGERY     secondary to malocclusion  . THYROIDECTOMY     neoplasm  . Ureteral Surgery  1970    There were no vitals filed for this visit.  Subjective Assessment - 06/22/17 1211    Subjective  Patient had very little pain over the last few days untril last night. She has significant pain when she sits down for the night.     Pertinent History  Pacemaker, Multi joint arthritis    Limitations  Standing;Walking    How long can you sit comfortably?  No limit     How long can you stand comfortably?  No limit     How long can you walk comfortably?  No limit     Diagnostic tests  Cervical X-ray: Mild multi joint degeneration     Patient Stated Goals  To have less pain in her neck     Currently in Pain?  Yes    Pain Score  2     Pain Location  Neck    Pain Orientation  Right  Pain Descriptors / Indicators  Aching;Constant    Pain Type  Chronic pain    Pain Onset  More than a month ago    Pain Frequency  Constant    Aggravating Factors   tunring her head     Pain Relieving Factors  rest    Effect of Pain on Daily Activities  difficulty driving          [facility] PT Assessment - 06/22/17 0001      AROM   Cervical - Right Rotation  45    Cervical - Left Rotation  57      Strength   Right Shoulder Flexion  5/5    Right Shoulder Internal Rotation  4+/5    Right Shoulder External Rotation  4+/5                  [facility] Adult PT Treatment/Exercise - 06/22/17 0001      Modalities   Modalities  Moist Heat      Moist Heat Therapy   Moist Heat Location  Cervical      Manual Therapy   Manual therapy comments  gentle sub-occipital release on wedge; gentle manual  traction on wedge. Seated IASTYM to bilateral upper traps, Focused              PT Education - 06/22/17 1213    Education provided  Yes    Education Details  reviewed posture     Person(s) Educated  Patient    Methods  Explanation;Demonstration;Tactile cues;Verbal cues    Comprehension  Verbalized understanding;Returned demonstration;Verbal cues required;Tactile cues required       PT Short Term Goals - 06/22/17 1216      PT SHORT TERM GOAL #1   Title  Patient will increase builateral cervical rotation by 25 degrees     Baseline  improving rotation but hasnt met goal     Time  4    Period  Weeks    Status  On-going      PT SHORT TERM GOAL #2   Title  Patient will demsotrate full pain free cervical flexion     Baseline  still pain at end range     Period  Weeks    Status  On-going      PT SHORT TERM GOAL #3   Title  Patient will demsogtrate 5/5 gross eright shoulder flexion     Baseline  strength improving     Time  4    Period  Weeks    Status  On-going      PT SHORT TERM GOAL #4   Title  Patient will report decreased tenderness to palpation in her right upper trap     Time  4    Period  Weeks    Status  On-going        PT Long Term Goals - 05/28/17 0754      PT LONG TERM GOAL #1   Title  Patient will be independent with exercise program to reduce spasming and inmprove posture    Time  8    Period  Weeks    Status  On-going      PT LONG TERM GOAL #2   Title  Patient will report no ram horn headaches for 1 week    Time  8    Period  Weeks    Status  On-going      PT LONG TERM GOAL #3   Title  Patient wilkl demsotrate  60 degrees of rotation bilateral without pain in order to drive safely     Time  8    Period  Weeks    Status  On-going            Plan - 06/22/17 1214    Clinical Impression Statement  Patient is having less consitent pain. She continues to hjave pain at night. Her FOTO score improved by 10%. She is slowly progressing towards  goals. She had improved rotation after trreatment. She would benefit from therapy 2x a week for 4 more weeks.     Clinical Presentation  Evolving    Clinical Decision Making  Moderate    Rehab Potential  Good    PT Frequency  2x / week    PT Duration  8 weeks    PT Treatment/Interventions  ADLs/Self Care Home Management;Cryotherapy;Electrical Stimulation;Iontophoresis 84m/ml Dexamethasone;Therapeutic activities;Therapeutic exercise;Neuromuscular re-education;Patient/family education;Manual techniques;Splinting;Taping;Passive range of motion;Moist Heat;Traction;Ultrasound;Dry needling    PT Next Visit Plan  do manual work on wedge or sitting, light postrula exercises, when she sits in good postrue she gets pain from her pace maker; consdier light pec release; decompresion position as much as possble on the wedge; Continue manual therapy to upper traps and cervical paraspianls; review scpa retraction.  G-code and FOTO     PT Home Exercise Plan  scap retraction; cervical rotation; upper trap stretch    Consulted and Agree with Plan of Care  Patient       Patient will benefit from skilled therapeutic intervention in order to improve the following deficits and impairments:  Abnormal gait, Pain, Increased edema, Decreased strength, Impaired UE functional use, Decreased endurance, Decreased activity tolerance, Decreased range of motion, Postural dysfunction  Visit Diagnosis: Cervicalgia  Other muscle spasm     Problem List Patient Active Problem List   Diagnosis Date Noted  . 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 Atrial fibrillation ([place] 11/17/2013  . Chronic systolic heart failure ([place] 10/13/2013  . Hyponatremia 03/04/2013  . Constipation 09/13/2011  . GERD 09/13/2010  . HYPOTHYROIDISM 04/28/2009  . HYPERLIPIDEMIA 04/28/2009  . Hypertension 04/28/2009  . CARDIOMYOPATHY 04/28/2009  . LEFT BUNDLE BRANCH BLOCK 04/28/2009  . DEGENERATIVE JOINT  DISEASE 04/28/2009    DCarney LivingPT DPT  06/22/2017, 12:18 PM  [facility]191 [address]GSaltillo [place] [address]Phone: [phone]  Fax:  [phone] Name: DHOKULANI ROGELMRN: [id number] of Birth: 131-Jan-1942

## 2017-06-26 ENCOUNTER — Ambulatory Visit: Payer: Medicare Other | Admitting: Physical Therapy

## 2017-06-26 LAB — CUP PACEART REMOTE DEVICE CHECK
Battery Voltage: 2.96 V
Brady Statistic AP VP Percent: 5.86 %
Brady Statistic RA Percent Paced: 5.72 %
Date Time Interrogation Session: 20181206093824
HIGH POWER IMPEDANCE MEASURED VALUE: 74 Ohm
Implantable Lead Implant Date: 20150417
Implantable Lead Implant Date: 20150417
Implantable Lead Location: 753858
Implantable Lead Location: 753859
Implantable Lead Model: 4298
Implantable Lead Model: 6935
Lead Channel Impedance Value: 285 Ohm
Lead Channel Impedance Value: 342 Ohm
Lead Channel Impedance Value: 361 Ohm
Lead Channel Impedance Value: 399 Ohm
Lead Channel Impedance Value: 646 Ohm
Lead Channel Impedance Value: 665 Ohm
Lead Channel Pacing Threshold Amplitude: 0.625 V
Lead Channel Pacing Threshold Pulse Width: 0.4 ms
Lead Channel Pacing Threshold Pulse Width: 0.4 ms
Lead Channel Sensing Intrinsic Amplitude: 8.625 mV
Lead Channel Sensing Intrinsic Amplitude: 8.625 mV
Lead Channel Setting Pacing Amplitude: 2 V
Lead Channel Setting Pacing Amplitude: 2 V
Lead Channel Setting Pacing Amplitude: 2.5 V
Lead Channel Setting Pacing Pulse Width: 0.4 ms
Lead Channel Setting Pacing Pulse Width: 0.4 ms
MDC IDC LEAD IMPLANT DT: 20150417
MDC IDC LEAD LOCATION: 753860
MDC IDC MSMT BATTERY REMAINING LONGEVITY: 40 mo
MDC IDC MSMT LEADCHNL LV IMPEDANCE VALUE: 361 Ohm
MDC IDC MSMT LEADCHNL LV IMPEDANCE VALUE: 361 Ohm
MDC IDC MSMT LEADCHNL LV IMPEDANCE VALUE: 399 Ohm
MDC IDC MSMT LEADCHNL LV IMPEDANCE VALUE: 589 Ohm
MDC IDC MSMT LEADCHNL LV IMPEDANCE VALUE: 608 Ohm
MDC IDC MSMT LEADCHNL LV IMPEDANCE VALUE: 608 Ohm
MDC IDC MSMT LEADCHNL LV PACING THRESHOLD AMPLITUDE: 1 V
MDC IDC MSMT LEADCHNL RA PACING THRESHOLD AMPLITUDE: 0.5 V
MDC IDC MSMT LEADCHNL RA PACING THRESHOLD PULSEWIDTH: 0.4 ms
MDC IDC MSMT LEADCHNL RA SENSING INTR AMPL: 1.625 mV
MDC IDC MSMT LEADCHNL RA SENSING INTR AMPL: 1.625 mV
MDC IDC MSMT LEADCHNL RV IMPEDANCE VALUE: 361 Ohm
MDC IDC PG IMPLANT DT: 20150417
MDC IDC SET LEADCHNL RV SENSING SENSITIVITY: 0.3 mV
MDC IDC STAT BRADY AP VS PERCENT: 0.02 %
MDC IDC STAT BRADY AS VP PERCENT: 91.68 %
MDC IDC STAT BRADY AS VS PERCENT: 2.44 %
MDC IDC STAT BRADY RV PERCENT PACED: 93.86 %

## 2017-06-29 ENCOUNTER — Ambulatory Visit: Payer: Medicare Other | Admitting: Physical Therapy

## 2017-06-29 ENCOUNTER — Encounter: Payer: Self-pay | Admitting: Physical Therapy

## 2017-06-29 ENCOUNTER — Encounter: Payer: Self-pay | Admitting: Cardiology

## 2017-06-29 DIAGNOSIS — M542 Cervicalgia: Secondary | ICD-10-CM

## 2017-06-29 DIAGNOSIS — M62838 Other muscle spasm: Secondary | ICD-10-CM

## 2017-06-29 NOTE — Therapy (Signed)
[place], [name], [address] Phone: [phone]   Fax:  [phone]  Physical Therapy Treatment/ recert   Patient Details  Name: [name] MRN: [id number] Date of Birth: 1940/10/19 Referring Provider: Dr Basil Dess   Encounter Date: 06/29/2017  PT End of Session - 06/29/17 1041    Visit Number  16    Number of Visits  24    Date for PT Re-Evaluation  08/10/17    Authorization Type  Medicare traid  G-code and re-assessment next visit     PT Start Time  1015    PT Stop Time  1105    PT Time Calculation (min)  50 min    Activity Tolerance  Patient tolerated treatment well    Behavior During Therapy  [facility] for tasks assessed/performed       Past Medical History:  Diagnosis Date  . 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 Cerebrovascular accident [facility])    No clinical event; asymptomatic CT finding  . DDD (degenerative disc disease), cervical    Cervical  . Degenerative joint disease    Right hip and knee  . GERD (gastroesophageal reflux disease)   . Hip dislocation, right ([place])   . Hyperlipemia   . Hypertension   . Hypothyroid   . LBBB (left bundle branch block)   . S/P colonoscopy 2009   Dr. Sharlett Iles: reportedly normal per pt  . S/P endoscopy 1980s   per pt; normal. performed secondary to reflux    Past Surgical History:  Procedure Laterality Date  . ABDOMINAL HYSTERECTOMY    . APPENDECTOMY    . BI-VENTRICULAR IMPLANTABLE CARDIOVERTER DEFIBRILLATOR  (CRT-D)  10-31-2013   MDT Hillery Aldo CRTD implanted by Dr Lovena Le  . BREAST BIOPSY     bilaterally; benign  . can not have MRI due to jaw surgery    . CHOLECYSTECTOMY    . COLONOSCOPY  2009  . ESOPHAGOGASTRODUODENOSCOPY  07/13/2011   small hh, patchy gastric erythema with scarring deformity of antrum, chronic gastritis, bx benign  with no H.Pylori. Procedure: ESOPHAGOGASTRODUODENOSCOPY (EGD);  Surgeon: Daneil Dolin, MD;  [facility];  Service: Endoscopy;  Laterality: N/A;  10:45  . IMPLANTABLE CARDIOVERTER DEFIBRILLATOR IMPLANT N/A 10/31/2013   Procedure: IMPLANTABLE CARDIOVERTER DEFIBRILLATOR IMPLANT;  Surgeon: Evans Lance, MD;  Location: [facility] CATH LAB;  Service: Cardiovascular;  Laterality: N/A;  . MANDIBLE SURGERY     secondary to malocclusion  . THYROIDECTOMY     neoplasm  . Ureteral Surgery  1970    There were no vitals filed for this visit.  Subjective Assessment - 06/29/17 1019    Subjective  Patient continues to have pain at night. She has been having less pain overall. She feels the most pain when she sits down in her chair at night.     Currently in Pain?  Yes    Pain Score  3     Pain Location  Neck    Pain Orientation  Right    Pain Descriptors / Indicators  Aching;Constant    Pain Type  Chronic pain    Pain Onset  More than a month ago    Pain Frequency  Constant    Aggravating Factors   turning head     Pain Relieving Factors  rest     Effect of Pain on Daily Activities  difficulty driving          [facility] PT Assessment - 06/29/17 0001      AROM   Cervical - Right Rotation  48    Cervical - Left Rotation  58                  [facility] Adult PT Treatment/Exercise - 06/29/17 0001      Modalities   Modalities  Moist Heat      Moist Heat Therapy   Number Minutes Moist Heat  10 Minutes    Moist Heat Location  Cervical      Manual Therapy   Manual therapy comments  gentle sub-occipital release on wedge; gentle manual traction on wedge. Seated IASTYM to bilateral upper traps, Focused              PT Education - 06/29/17 1306    Education provided  Yes    Education Details  reviewed psotrue     Person(s) Educated  Patient    Methods  Explanation;Demonstration;Tactile cues;Verbal cues    Comprehension  Verbalized understanding;Returned demonstration;Verbal  cues required;Tactile cues required       PT Short Term Goals - 06/22/17 1216      PT SHORT TERM GOAL #1   Title  Patient will increase builateral cervical rotation by 25 degrees     Baseline  improving rotation but hasnt met goal     Time  4    Period  Weeks    Status  On-going      PT SHORT TERM GOAL #2   Title  Patient will demsotrate full pain free cervical flexion     Baseline  still pain at end range     Period  Weeks    Status  On-going      PT SHORT TERM GOAL #3   Title  Patient will demsogtrate 5/5 gross eright shoulder flexion     Baseline  strength improving     Time  4    Period  Weeks    Status  On-going      PT SHORT TERM GOAL #4   Title  Patient will report decreased tenderness to palpation in her right upper trap     Time  4    Period  Weeks    Status  On-going        PT Long Term Goals - 05/28/17 0754      PT LONG TERM GOAL #1   Title  Patient will be independent with exercise program to reduce spasming and inmprove posture    Time  8    Period  Weeks    Status  On-going      PT LONG TERM GOAL #2   Title  Patient will report no ram horn headaches for 1 week    Time  8    Period  Weeks    Status  On-going      PT LONG TERM GOAL #3   Title  Patient wilkl demsotrate 60 degrees of rotation bilateral without pain in order to drive safely     Time  8    Period  Weeks    Status  On-going            Plan - 06/29/17 1306    Clinical Impression Statement  Therapy continues to focus on manul therapy. She is having slow improvements, Her FOTO score has improved 10%. Therapy will continue to work with the patient on improving posture.  She will work with PTA next week on potential scar tissue around her deficbulator and see if that may be keeping her fro being aboe to pdo postural exercises. Patient's range hasimproved.     Clinical Presentation  Evolving    Clinical Decision Making  Moderate    Rehab Potential  Good    PT Frequency  2x / week     PT Duration  8 weeks    PT Treatment/Interventions  ADLs/Self Care Home Management;Cryotherapy;Electrical Stimulation;Iontophoresis 39m/ml Dexamethasone;Therapeutic activities;Therapeutic exercise;Neuromuscular re-education;Patient/family education;Manual techniques;Splinting;Taping;Passive range of motion;Moist Heat;Traction;Ultrasound;Dry needling    PT Next Visit Plan  do manual work on wedge or sitting, light postrula exercises, when she sits in good postrue she gets pain from her pace maker; consdier light pec release; decompresion position as much as possble on the wedge; Continue manual therapy to upper traps and cervical paraspianls; review scpa retraction.  G-code and FOTO     PT Home Exercise Plan  scap retraction; cervical rotation; upper trap stretch    Consulted and Agree with Plan of Care  Patient       Patient will benefit from skilled therapeutic intervention in order to improve the following deficits and impairments:  Abnormal gait, Pain, Increased edema, Decreased strength, Impaired UE functional use, Decreased endurance, Decreased activity tolerance, Decreased range of motion, Postural dysfunction  Visit Diagnosis: Cervicalgia - Plan: PT plan of care cert/re-cert  Other muscle spasm - Plan: PT plan of care cert/re-cert     Problem List Patient Active Problem List   Diagnosis Date Noted  . 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 Atrial fibrillation ([place] 11/17/2013  . Chronic systolic heart failure ([place] 10/13/2013  . Hyponatremia 03/04/2013  . Constipation 09/13/2011  . GERD 09/13/2010  . HYPOTHYROIDISM 04/28/2009  . HYPERLIPIDEMIA 04/28/2009  . Hypertension 04/28/2009  . CARDIOMYOPATHY 04/28/2009  . LEFT BUNDLE BRANCH BLOCK 04/28/2009  . DEGENERATIVE JOINT DISEASE 04/28/2009    DCarney Living12/14/2018, 1:13 PM  [facility]  [address]Phone: [phone]  Fax:  [phone] Name: [name]: [id number] of Birth: [birth year]

## 2017-07-03 ENCOUNTER — Ambulatory Visit: Payer: Medicare Other | Admitting: Physical Therapy

## 2017-07-03 ENCOUNTER — Encounter: Payer: Self-pay | Admitting: Physical Therapy

## 2017-07-03 DIAGNOSIS — M542 Cervicalgia: Secondary | ICD-10-CM

## 2017-07-03 DIAGNOSIS — M62838 Other muscle spasm: Secondary | ICD-10-CM

## 2017-07-03 NOTE — Therapy (Signed)
[place], [name], [address] Phone: [phone]   Fax:  [phone]  Physical Therapy Treatment  Patient Details  Name: [name] MRN: [id number] Date of Birth: [birth year] Referring Provider: Dr Basil Dess   Encounter Date: 07/03/2017  PT End of Session - 07/03/17 1106    Visit Number  17    Number of Visits  24    Date for PT Re-Evaluation  08/10/17    Authorization Type  Medicare traid  G-code and re-assessment next visit     PT Start Time  1015    PT Stop Time  1105    PT Time Calculation (min)  50 min    Activity Tolerance  Patient tolerated treatment well    Behavior During Therapy  [facility] for tasks assessed/performed       Past Medical History:  Diagnosis Date  . 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 Cerebrovascular accident [facility])    No clinical event; asymptomatic CT finding  . DDD (degenerative disc disease), cervical    Cervical  . Degenerative joint disease    Right hip and knee  . GERD (gastroesophageal reflux disease)   . Hip dislocation, right ([place])   . Hyperlipemia   . Hypertension   . Hypothyroid   . LBBB (left bundle branch block)   . S/P colonoscopy 2009   Dr. Sharlett Iles: reportedly normal per pt  . S/P endoscopy 1980s   per pt; normal. performed secondary to reflux    Past Surgical History:  Procedure Laterality Date  . ABDOMINAL HYSTERECTOMY    . APPENDECTOMY    . BI-VENTRICULAR IMPLANTABLE CARDIOVERTER DEFIBRILLATOR  (CRT-D)  10-31-2013   MDT Hillery Aldo CRTD implanted by Dr Lovena Le  . BREAST BIOPSY     bilaterally; benign  . can not have MRI due to jaw surgery    . CHOLECYSTECTOMY    . COLONOSCOPY  2009  . ESOPHAGOGASTRODUODENOSCOPY  07/13/2011   small hh, patchy gastric erythema with scarring deformity of antrum, chronic gastritis, bx benign with no  H.Pylori. Procedure: ESOPHAGOGASTRODUODENOSCOPY (EGD);  Surgeon: Daneil Dolin, MD;  [facility];  Service: Endoscopy;  Laterality: N/A;  10:45  . IMPLANTABLE CARDIOVERTER DEFIBRILLATOR IMPLANT N/A 10/31/2013   Procedure: IMPLANTABLE CARDIOVERTER DEFIBRILLATOR IMPLANT;  Surgeon: Evans Lance, MD;  Location: [facility] CATH LAB;  Service: Cardiovascular;  Laterality: N/A;  . MANDIBLE SURGERY     secondary to malocclusion  . THYROIDECTOMY     neoplasm  . Ureteral Surgery  1970    There were no vitals filed for this visit.  Subjective Assessment - 07/03/17 1020    Subjective  Patient reports her neck has been feeling pretty good over the past few days. Her pain this morning is only about a 4/10. She had pain running up the side of her neck.   (Pended)     Pertinent History  Pacemaker, Multi joint arthritis  (Pended)     Limitations  Standing;Walking  (Pended)     How long can you sit comfortably?  No limit   (Pended)     How long can you stand comfortably?  No limit   (Pended)     How long can you walk comfortably?  No limit   (Pended)     Currently in Pain?  Yes  (Pended)     Pain Score  4   (Pended)     Pain Location  Neck  (Pended)     Pain Orientation  Right  (Pended)     Pain Descriptors / Indicators  Aching;Constant  (Pended)     Pain Type  Chronic pain  (Pended)     Pain Onset  More than a month ago  (Pended)     Pain Frequency  Constant  (Pended)                       [facility] Adult PT Treatment/Exercise - 07/03/17 0001      Modalities   Modalities  Moist Heat      Moist Heat Therapy   Number Minutes Moist Heat  10 Minutes    Moist Heat Location  Cervical      Manual Therapy   Manual therapy comments  gentle sub-occipital release on wedge; gentle manual traction on wedge. Seated IASTYM to bilateral upper traps, Focused              PT Education - 07/03/17 1043    Education provided  Yes    Education Details  postrue     Person(s) Educated   Patient    Methods  Explanation;Demonstration;Tactile cues;Verbal cues    Comprehension  Verbalized understanding;Returned demonstration;Tactile cues required;Verbal cues required       PT Short Term Goals - 06/22/17 1216      PT SHORT TERM GOAL #1   Title  Patient will increase builateral cervical rotation by 25 degrees     Baseline  improving rotation but hasnt met goal     Time  4    Period  Weeks    Status  On-going      PT SHORT TERM GOAL #2   Title  Patient will demsotrate full pain free cervical flexion     Baseline  still pain at end range     Period  Weeks    Status  On-going      PT SHORT TERM GOAL #3   Title  Patient will demsogtrate 5/5 gross eright shoulder flexion     Baseline  strength improving     Time  4    Period  Weeks    Status  On-going      PT SHORT TERM GOAL #4   Title  Patient will report decreased tenderness to palpation in her right upper trap     Time  4    Period  Weeks    Status  On-going        PT Long Term Goals - 05/28/17 0754      PT LONG TERM GOAL #1   Title  Patient will be independent with exercise program to reduce spasming and inmprove posture    Time  8    Period  Weeks    Status  On-going      PT LONG TERM GOAL #2   Title  Patient will report no ram horn headaches for 1 week    Time  8    Period  Weeks    Status  On-going      PT LONG TERM GOAL #3   Title  Patient wilkl demsotrate 60 degrees of rotation bilateral without pain in order to drive safely     Time  8    Period  Weeks    Status  On-going            Plan - 07/03/17 1112  Clinical Impression Statement  Patient appears to be making some forward progress. She was able to sit last night without increased pain at night. She continuews to report some pain in her sclaenes., therapy has been working on first rib mobilizations. She reports feeling pain in the same area with the first rib mobilization but it feels better after. Therapy continues to ofcus on  manual therapy.     Clinical Presentation  Evolving    Clinical Decision Making  Moderate    Rehab Potential  Good    PT Frequency  2x / week    PT Duration  8 weeks    PT Treatment/Interventions  ADLs/Self Care Home Management;Cryotherapy;Electrical Stimulation;Iontophoresis 34m/ml Dexamethasone;Therapeutic activities;Therapeutic exercise;Neuromuscular re-education;Patient/family education;Manual techniques;Splinting;Taping;Passive range of motion;Moist Heat;Traction;Ultrasound;Dry needling    PT Next Visit Plan  do manual work on wedge or sitting, light postrula exercises, when she sits in good postrue she gets pain from her pace maker; consdier light pec release; decompresion position as much as possble on the wedge; Continue manual therapy to upper traps and cervical paraspianls; review scpa retraction.  G-code and FOTO     PT Home Exercise Plan  scap retraction; cervical rotation; upper trap stretch       Patient will benefit from skilled therapeutic intervention in order to improve the following deficits and impairments:  Abnormal gait, Pain, Increased edema, Decreased strength, Impaired UE functional use, Decreased endurance, Decreased activity tolerance, Decreased range of motion, Postural dysfunction  Visit Diagnosis: Cervicalgia  Other muscle spasm     Problem List Patient Active Problem List   Diagnosis Date Noted  . 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 Atrial fibrillation ([facility] 11/17/2013  . Chronic systolic heart failure ([place] 10/13/2013  . Hyponatremia 03/04/2013  . Constipation 09/13/2011  . GERD 09/13/2010  . HYPOTHYROIDISM 04/28/2009  . HYPERLIPIDEMIA 04/28/2009  . Hypertension 04/28/2009  . CARDIOMYOPATHY 04/28/2009  . LEFT BUNDLE BRANCH BLOCK 04/28/2009  . DEGENERATIVE JOINT DISEASE 04/28/2009    DCarney LivingPT DPT  07/03/2017, 11:18 AM  [facility] [address]Phone: [phone]  Fax:  [phone] Name: Mary SANDOMRN: [id number] of Birth: [birth year]

## 2017-07-06 ENCOUNTER — Ambulatory Visit: Payer: Medicare Other | Admitting: Physical Therapy

## 2017-07-12 ENCOUNTER — Encounter: Payer: Self-pay | Admitting: Physical Therapy

## 2017-07-12 ENCOUNTER — Ambulatory Visit: Payer: Medicare Other | Admitting: Physical Therapy

## 2017-07-12 DIAGNOSIS — M62838 Other muscle spasm: Secondary | ICD-10-CM | POA: Diagnosis not present

## 2017-07-12 DIAGNOSIS — M542 Cervicalgia: Secondary | ICD-10-CM

## 2017-07-12 NOTE — Therapy (Signed)
[place], [name], [address] Phone: [phone]   Fax:  [phone]  Physical Therapy Treatment  Patient Details  Name: [name] MRN: [id number] Date of Birth: 01/26/1941 Referring Provider: Dr Basil Dess   Encounter Date: 07/12/2017  PT End of Session - 07/12/17 1431    Visit Number  18    Number of Visits  24    Date for PT Re-Evaluation  08/10/17    PT Start Time  1330    PT Stop Time  1422    PT Time Calculation (min)  52 min    Activity Tolerance  Patient tolerated treatment well    Behavior During Therapy  [facility] for tasks assessed/performed       Past Medical History:  Diagnosis Date  . 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 Cerebrovascular accident [facility])    No clinical event; asymptomatic CT finding  . DDD (degenerative disc disease), cervical    Cervical  . Degenerative joint disease    Right hip and knee  . GERD (gastroesophageal reflux disease)   . Hip dislocation, right ([place])   . Hyperlipemia   . Hypertension   . Hypothyroid   . LBBB (left bundle branch block)   . S/P colonoscopy 2009   Dr. Sharlett Iles: reportedly normal per pt  . S/P endoscopy 1980s   per pt; normal. performed secondary to reflux    Past Surgical History:  Procedure Laterality Date  . ABDOMINAL HYSTERECTOMY    . APPENDECTOMY    . BI-VENTRICULAR IMPLANTABLE CARDIOVERTER DEFIBRILLATOR  (CRT-D)  10-31-2013   MDT Hillery Aldo CRTD implanted by Dr Lovena Le  . BREAST BIOPSY     bilaterally; benign  . can not have MRI due to jaw surgery    . CHOLECYSTECTOMY    . COLONOSCOPY  2009  . ESOPHAGOGASTRODUODENOSCOPY  07/13/2011   small hh, patchy gastric erythema with scarring deformity of antrum, chronic gastritis, bx benign with no H.Pylori. Procedure: ESOPHAGOGASTRODUODENOSCOPY (EGD);  Surgeon: Daneil Dolin,  MD;  [facility];  Service: Endoscopy;  Laterality: N/A;  10:45  . IMPLANTABLE CARDIOVERTER DEFIBRILLATOR IMPLANT N/A 10/31/2013   Procedure: IMPLANTABLE CARDIOVERTER DEFIBRILLATOR IMPLANT;  Surgeon: Evans Lance, MD;  Location: [facility] CATH LAB;  Service: Cardiovascular;  Laterality: N/A;  . MANDIBLE SURGERY     secondary to malocclusion  . THYROIDECTOMY     neoplasm  . Ureteral Surgery  1970    There were no vitals filed for this visit.  Subjective Assessment - 07/12/17 1423    Subjective  Had a root canal and infection.  She is on antibiotica and does not know what kind.  Last dose is tomorrow.    Anterior chest pain is improved a lot.  I was hoping you could work on my neck and shoulders.      Currently in Pain?  Yes    Pain Score  -- moderate    Pain Location  Neck    Pain Orientation  Right;Left;Lateral;Posterior    Pain Descriptors / Indicators  Aching;Tender;Headache    Pain Type  Chronic pain    Pain Radiating Towards  lateral shoulder to ear    Pain Frequency  Constant varies    Aggravating Factors   cleaning out the oven and other deep cleaning    Pain Relieving Factors  rest with neck support  [place] Adult PT Treatment/Exercise - 07/12/17 0001      Neck Exercises: Seated   Other Seated Exercise  abdominal breathing belly button pressed out with breath in and pulled to spine with breathing out  5 X added to hep  to assist stiffnedd superior to scar.    Other Seated Exercise  neck retraction 5 X added to HEP      Moist Heat Therapy   Number Minutes Moist Heat  10 Minutes    Moist Heat Location  Cervical      Manual Therapy   Manual therapy comments  soft tissue work neck auuper back an shoulders.  It was painful but fewlt good. "I can take the pain"  patient did not tolerate anterior chest soft tissue work distal to scar tissue congested.  Deep breathing was helpful with softening tissue.   Tissue above scar was soft and supple.   manual not done except for palpation there.               PT Education - 07/12/17 1431    Education provided  Yes    Education Details  HEP    Person(s) Educated  Patient    Methods  Explanation;Handout    Comprehension  Verbalized understanding;Returned demonstration       PT Short Term Goals - 07/12/17 1440      PT SHORT TERM GOAL #1   Title  Patient will increase builateral cervical rotation by 25 degrees     Baseline  improving rotation but hasnt met goal     Time  4    Period  Weeks    Status  On-going      PT SHORT TERM GOAL #2   Title  Patient will demsotrate full pain free cervical flexion     Baseline  still pain at end range     Time  4    Period  Weeks    Status  On-going      PT SHORT TERM GOAL #3   Title  Patient will demsogtrate 5/5 gross eright shoulder flexion     Time  4    Period  Weeks    Status  On-going      PT SHORT TERM GOAL #4   Title  Patient will report decreased tenderness to palpation in her right upper trap     Baseline  less tenderness post manual.    Time  4    Period  Weeks    Status  On-going        PT Long Term Goals - 07/12/17 1442      PT LONG TERM GOAL #1   Title  Patient will be independent with exercise program to reduce spasming and inmprove posture    Baseline  independent with exercises issued so far    Time  8    Period  Weeks    Status  On-going      PT LONG TERM GOAL #2   Title  Patient will report no ram horn headaches for 1 week    Baseline  HA continue    Time  8    Period  Weeks    Status  On-going      PT LONG TERM GOAL #3   Title  Patient wilkl demsotrate 60 degrees of rotation bilateral without pain in order to drive safely     Baseline  not met    Time  8    Period  Weeks  Status  On-going            Plan - 07/12/17 1433    Clinical Impression Statement  Patient was seen today formainlymanual PT to help pain around the defibulator.  Tissue above scar was soft and supple and non  tender. Tissue below scar was tender and congested.  manual was not tolerated, however tissue was softened with deep breathing  and soft tissue work to neck.  Patient has a stiff and sore neck today from cleaning the oven.   STG # 4 partially met.  patient is still getting headaches .  No new objective findings noted  other than she can sit more comfortable with neck support..     PT Next Visit Plan  do manual work on wedge or sitting, light postrula exercises, when she sits in good postrue she gets pain from her pace maker; consdier light pec release; decompresion position as much as possble on the wedge; Continue manual therapy to upper traps and cervical paraspianls; review scpa retraction.  G-code and FOTO     PT Home Exercise Plan  scap retraction; cervical rotation; upper trap stretch    Consulted and Agree with Plan of Care  Patient       Patient will benefit from skilled therapeutic intervention in order to improve the following deficits and impairments:     Visit Diagnosis: Cervicalgia  Other muscle spasm     Problem List Patient Active Problem List   Diagnosis Date Noted  . 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 Atrial fibrillation ([place]) 11/17/2013  . Chronic systolic heart failure ([place]) 10/13/2013  . Hyponatremia 03/04/2013  . Constipation 09/13/2011  . GERD 09/13/2010  . HYPOTHYROIDISM 04/28/2009  . HYPERLIPIDEMIA 04/28/2009  . Hypertension 04/28/2009  . CARDIOMYOPATHY 04/28/2009  . LEFT BUNDLE BRANCH BLOCK 04/28/2009  . DEGENERATIVE JOINT DISEASE 04/28/2009    HARRIS,KAREN PTA 07/12/2017, 2:46 PM  [facility] [address] [place], [name], [address] Phone: [phone]   Fax:  [phone]  Name: [name] MRN: [id number] Date of Birth: [birth year]

## 2017-07-12 NOTE — Patient Instructions (Signed)
Axial Extension (Chin Tuck)    Pull chin in and lengthen back of neck. Hold ___5_ seconds while counting out loud. Repeat __5__ times. Do as needed____ sessions per day.  http://gt2.exer.us/450   Copyright  VHI. All rights reserved.  Breath in and out with your belly.3-5 X

## 2017-07-13 ENCOUNTER — Encounter: Payer: Medicare Other | Admitting: Physical Therapy

## 2017-07-19 ENCOUNTER — Ambulatory Visit: Payer: Medicare Other | Attending: Specialist | Admitting: Physical Therapy

## 2017-07-19 ENCOUNTER — Encounter: Payer: Self-pay | Admitting: Physical Therapy

## 2017-07-19 DIAGNOSIS — M62838 Other muscle spasm: Secondary | ICD-10-CM

## 2017-07-19 DIAGNOSIS — M542 Cervicalgia: Secondary | ICD-10-CM | POA: Diagnosis not present

## 2017-07-19 NOTE — Therapy (Signed)
[place], [name], [address] Phone: [phone]   Fax:  [phone]  Physical Therapy Treatment  Patient Details  Name: [name] MRN: [id number] Date of Birth: [birth year] Referring Provider: Dr Basil Dess   Encounter Date: 07/19/2017  PT End of Session - 07/19/17 1337    Visit Number  19    Number of Visits  24    Date for PT Re-Evaluation  08/10/17    PT Start Time  1103    PT Stop Time  1157    PT Time Calculation (min)  54 min    Activity Tolerance  Patient tolerated treatment well    Behavior During Therapy  [facility] for tasks assessed/performed       Past Medical History:  Diagnosis Date  . 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 Cerebrovascular accident [facility])    No clinical event; asymptomatic CT finding  . DDD (degenerative disc disease), cervical    Cervical  . Degenerative joint disease    Right hip and knee  . GERD (gastroesophageal reflux disease)   . Hip dislocation, right ([place])   . Hyperlipemia   . Hypertension   . Hypothyroid   . LBBB (left bundle branch block)   . S/P colonoscopy 2009   Dr. Sharlett Iles: reportedly normal per pt  . S/P endoscopy 1980s   per pt; normal. performed secondary to reflux    Past Surgical History:  Procedure Laterality Date  . ABDOMINAL HYSTERECTOMY    . APPENDECTOMY    . BI-VENTRICULAR IMPLANTABLE CARDIOVERTER DEFIBRILLATOR  (CRT-D)  10-31-2013   MDT Hillery Aldo CRTD implanted by Dr Lovena Le  . BREAST BIOPSY     bilaterally; benign  . can not have MRI due to jaw surgery    . CHOLECYSTECTOMY    . COLONOSCOPY  2009  . ESOPHAGOGASTRODUODENOSCOPY  07/13/2011   small hh, patchy gastric erythema with scarring deformity of antrum, chronic gastritis, bx benign with no H.Pylori. Procedure: ESOPHAGOGASTRODUODENOSCOPY (EGD);  Surgeon: Daneil Dolin,  MD;  [facility];  Service: Endoscopy;  Laterality: N/A;  10:45  . IMPLANTABLE CARDIOVERTER DEFIBRILLATOR IMPLANT N/A 10/31/2013   Procedure: IMPLANTABLE CARDIOVERTER DEFIBRILLATOR IMPLANT;  Surgeon: Evans Lance, MD;  Location: [facility] CATH LAB;  Service: Cardiovascular;  Laterality: N/A;  . MANDIBLE SURGERY     secondary to malocclusion  . THYROIDECTOMY     neoplasm  . Ureteral Surgery  1970    There were no vitals filed for this visit.  Subjective Assessment - 07/19/17 1327    Subjective  Patient feels good after PT sessions.  Pain increases ayt end of the day but is not as bad.  Last couple of days pain 6/10 when in recliner in the evening.  I have done the exercises.   Pain on right side of neck.  I also have a few screws in my jaw.    Currently in Pain?  Yes    Pain Score  4  6/10 last night    Pain Orientation  Right    Pain Descriptors / Indicators  Aching;Tightness;Tender;Headache    Pain Type  Chronic pain    Pain Radiating Towards  lateral shoulder to ear    Pain Frequency  Constant    Aggravating Factors   reading    Pain Relieving Factors  rest with neck support  Effect of Pain on Daily Activities  limits reading,  hurts to look up    Multiple Pain Sites  -- has hips and knee pain,  arthritis         [facility] PT Assessment - 07/19/17 0001      AROM   Cervical - Right Rotation  39    Cervical - Left Rotation  25                  [facility] Adult PT Treatment/Exercise - 07/19/17 0001      Neck Exercises: Seated   Other Seated Exercise  abdominal breathing belly button pressed out with breath in and pulled to spine with breathing out  5 X     Other Seated Exercise  Neck/ shoulder retraction 5 x 5 seconds eack  5 X      Moist Heat Therapy   Number Minutes Moist Heat  10 Minutes    Moist Heat Location  Cervical      Manual Therapy   Manual therapy comments  right neck and shoulder,  Tissue softened strumminmg to Muscles to lengthen  tissue              PT Education - 07/19/17 1337    Education provided  No       PT Short Term Goals - 07/19/17 1341      PT SHORT TERM GOAL #1   Title  Patient will increase builateral cervical rotation by 25 degrees     Baseline  39 right/  25 left.  has not met goal    Time  4    Period  Weeks    Status  On-going      PT SHORT TERM GOAL #2   Title  Patient will demsotrate full pain free cervical flexion     Baseline  pain with rotation,  ROM limited    Time  4    Period  Weeks    Status  On-going      PT SHORT TERM GOAL #3   Title  Patient will demsogtrate 5/5 gross eright shoulder flexion     Time  4    Period  Weeks    Status  Unable to assess      PT SHORT TERM GOAL #4   Title  Patient will report decreased tenderness to palpation in her right upper trap     Baseline  less tenderness post manual.    Time  4    Period  Weeks    Status  On-going        PT Long Term Goals - 07/19/17 1342      PT LONG TERM GOAL #1   Title  Patient will be independent with exercise program to reduce spasming and inmprove posture    Baseline  independent with exercises issued so far    Time  8    Period  Weeks    Status  On-going      PT LONG TERM GOAL #2   Title  Patient will report no ram horn headaches for 1 week    Baseline  HA continue    Time  8    Period  Weeks    Status  On-going      PT LONG TERM GOAL #3   Title  Patient wilkl demsotrate 60 degrees of rotation bilateral without pain in order to drive safely     Baseline  39 right/  25 left today  Time  8    Period  Weeks    Status  On-going            Plan - 07/19/17 1338    Clinical Impression Statement  Manual and some posture ex continued today.  Rotation 39/RT and 25 / LT rotation  AROM.  FOTO 48 %  .  Limitation increased,  ROM decreased.  Neck right sore at end of session however it feels better.   She has agreed to try sitting in another chair at end of day to see if pain changes.     PT Next Visit  Plan  manual,  ROM  work toward goals.  Remember KX modifier    PT Home Exercise Plan  scap retraction; cervical rotation; upper trap stretch    Consulted and Agree with Plan of Care  Patient       Patient will benefit from skilled therapeutic intervention in order to improve the following deficits and impairments:     Visit Diagnosis: Cervicalgia  Other muscle spasm     Problem List Patient Active Problem List   Diagnosis Date Noted  . 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 Atrial fibrillation ([place]) 11/17/2013  . Chronic systolic heart failure ([place]) 10/13/2013  . Hyponatremia 03/04/2013  . Constipation 09/13/2011  . GERD 09/13/2010  . HYPOTHYROIDISM 04/28/2009  . HYPERLIPIDEMIA 04/28/2009  . Hypertension 04/28/2009  . CARDIOMYOPATHY 04/28/2009  . LEFT BUNDLE BRANCH BLOCK 04/28/2009  . DEGENERATIVE JOINT DISEASE 04/28/2009    Covey Baller PTA 07/19/2017, 1:45 PM  [facility] [address] [place], [name], [address] Phone: [phone]   Fax:  [phone]  Name: [name] MRN: [id number] Date of Birth: July 30, 1940

## 2017-07-24 ENCOUNTER — Encounter: Payer: Self-pay | Admitting: Physical Therapy

## 2017-07-24 ENCOUNTER — Ambulatory Visit: Payer: Medicare Other | Admitting: Physical Therapy

## 2017-07-24 DIAGNOSIS — M62838 Other muscle spasm: Secondary | ICD-10-CM

## 2017-07-24 DIAGNOSIS — M542 Cervicalgia: Secondary | ICD-10-CM | POA: Diagnosis not present

## 2017-07-24 NOTE — Patient Instructions (Signed)
Issued from ex drawer: Cervical stabilization exercises. Daily 5-10 x each Hold 5 seconds All issued

## 2017-07-24 NOTE — Therapy (Signed)
[place], [name], [address] Phone: [phone]   Fax:  [phone]  Physical Therapy Treatment  Patient Details  Name: [name] MRN: [id number] Date of Birth: 08/04/1940 Referring Provider: Dr Basil Dess   Encounter Date: 07/24/2017  PT End of Session - 07/24/17 1305    Visit Number  20    Number of Visits  24    Date for PT Re-Evaluation  08/10/17    PT Start Time  1103    PT Stop Time  1200    PT Time Calculation (min)  57 min    Activity Tolerance  Patient tolerated treatment well    Behavior During Therapy  [facility] for tasks assessed/performed       Past Medical History:  Diagnosis Date  . 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 Cerebrovascular accident [facility])    No clinical event; asymptomatic CT finding  . DDD (degenerative disc disease), cervical    Cervical  . Degenerative joint disease    Right hip and knee  . GERD (gastroesophageal reflux disease)   . Hip dislocation, right ([place])   . Hyperlipemia   . Hypertension   . Hypothyroid   . LBBB (left bundle branch block)   . S/P colonoscopy 2009   Dr. Sharlett Iles: reportedly normal per pt  . S/P endoscopy 1980s   per pt; normal. performed secondary to reflux    Past Surgical History:  Procedure Laterality Date  . ABDOMINAL HYSTERECTOMY    . APPENDECTOMY    . BI-VENTRICULAR IMPLANTABLE CARDIOVERTER DEFIBRILLATOR  (CRT-D)  10-31-2013   MDT Hillery Aldo CRTD implanted by Dr Lovena Le  . BREAST BIOPSY     bilaterally; benign  . can not have MRI due to jaw surgery    . CHOLECYSTECTOMY    . COLONOSCOPY  2009  . ESOPHAGOGASTRODUODENOSCOPY  07/13/2011   small hh, patchy gastric erythema with scarring deformity of antrum, chronic gastritis, bx benign with no H.Pylori. Procedure: ESOPHAGOGASTRODUODENOSCOPY (EGD);  Surgeon: Daneil Dolin,  MD;  [facility];  Service: Endoscopy;  Laterality: N/A;  10:45  . IMPLANTABLE CARDIOVERTER DEFIBRILLATOR IMPLANT N/A 10/31/2013   Procedure: IMPLANTABLE CARDIOVERTER DEFIBRILLATOR IMPLANT;  Surgeon: Evans Lance, MD;  Location: [facility] CATH LAB;  Service: Cardiovascular;  Laterality: N/A;  . MANDIBLE SURGERY     secondary to malocclusion  . THYROIDECTOMY     neoplasm  . Ureteral Surgery  1970    There were no vitals filed for this visit.  Subjective Assessment - 07/24/17 1107    Subjective  Has ben a 4/10 today.  has been doing exercises.    Did not have a headach yesterday.   Tried sitting in a chair to read 2 days and pain was worse with no head support.      Currently in Pain?  Yes    Pain Score  4     Pain Location  Neck    Pain Orientation  Right    Pain Descriptors / Indicators  Aching;Tightness;Tender    Pain Frequency  Constant    Aggravating Factors   reading    Pain Relieving Factors  rest with neck support,   manual    Multiple Pain Sites  -- left hip 5/10 ,  needs a replacement but they can't do surgery.  [place] Adult PT Treatment/Exercise - 07/24/17 0001      Neck Exercises: Supine   Neck Retraction  5 reps    Other Supine Exercise  head press,  scap squeeze with head press,    head press with tongue to roof of mouth and look with eyes over head without moving head.     5 x each.  (Cervical Stab 1 exercises)..  fist squeeze with head press,  isometric pec with head press and shoulder supination/ pronation with head press.  Pain resolved post exercises    Other Supine Exercise  shoulder extension right , yellow band 10 X,  ER yellow both 10 X      Moist Heat Therapy   Number Minutes Moist Heat  15 Minutes    Moist Heat Location  Cervical      Manual Therapy   Manual therapy comments  soft  tissue work right nech,  periscapular and shoulder.  supine semi reclined on wedge.,  tissue softened             PT Education  - 07/24/17 1300    Education provided  Yes    Education Details  HEP    Person(s) Educated  Patient    Methods  Verbal cues;Handout    Comprehension  Verbalized understanding;Returned demonstration       PT Short Term Goals - 07/24/17 1308      PT SHORT TERM GOAL #1   Title  Patient will increase builateral cervical rotation by 25 degrees     Baseline  worked on this supine,  Not yet increased 25 degrees    Time  4    Period  Weeks    Status  On-going      PT SHORT TERM GOAL #2   Title  Patient will demsotrate full pain free cervical flexion     Baseline  pain with rotation,  ROM limited    Time  4    Period  Weeks    Status  On-going      PT SHORT TERM GOAL #3   Title  Patient will demsogtrate 5/5 gross eright shoulder flexion     Time  4    Status  Unable to assess      PT SHORT TERM GOAL #4   Title  Patient will report decreased tenderness to palpation in her right upper trap     Baseline    No pain at end of session,  ?  consistant    Time  4    Period  Weeks    Status  On-going        PT Long Term Goals - 07/24/17 1311      PT LONG TERM GOAL #1   Title  Patient will be independent with exercise program to reduce spasming and inmprove posture    Baseline  independent with exercises issued so far    Time  8    Period  Weeks    Status  On-going      PT LONG TERM GOAL #2   Title  Patient will report no ram horn headaches for 1 week    Baseline  No Ha today,  Off and on    Time  8    Period  Weeks    Status  On-going      PT LONG TERM GOAL #3   Title  Patient wilkl demsotrate 60 degrees of rotation bilateral without pain in order to drive safely  Time  8    Period  Weeks    Status  Unable to assess            Plan - 07/24/17 1306    Clinical Impression Statement  Patient was able to have no pain at end of session post session. Mild pain continued post manual,  No pain post exercise.  Sitting in another chair to read  did not help pain in the  evening.      PT Next Visit Plan  manual,  ROM  work toward goals.  Remember KX modifier  Review new exercises    PT Home Exercise Plan  scap retraction; cervical rotation; upper trap stretch,  cervical stabilization 1 exercises    Consulted and Agree with Plan of Care  Patient       Patient will benefit from skilled therapeutic intervention in order to improve the following deficits and impairments:     Visit Diagnosis: Cervicalgia  Other muscle spasm     Problem List Patient Active Problem List   Diagnosis Date Noted  . 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 Atrial fibrillation ([place]) 11/17/2013  . Chronic systolic heart failure ([place]) 10/13/2013  . Hyponatremia 03/04/2013  . Constipation 09/13/2011  . GERD 09/13/2010  . HYPOTHYROIDISM 04/28/2009  . HYPERLIPIDEMIA 04/28/2009  . Hypertension 04/28/2009  . CARDIOMYOPATHY 04/28/2009  . LEFT BUNDLE BRANCH BLOCK 04/28/2009  . DEGENERATIVE JOINT DISEASE 04/28/2009    HARRIS,KAREN  PTA 07/24/2017, 4:19 PM  [facility] [address] [place], [name], [address] Phone: [phone]   Fax:  [phone]  Name: [name] MRN: [id number] Date of Birth: May 11, 1941

## 2017-07-24 NOTE — Therapy (Signed)
[place], [name], [address] Phone: [phone]   Fax:  [phone]  Physical Therapy Treatment  Patient Details  Name: [name] MRN: [id number] Date of Birth: 1941/07/13 Referring Provider: Dr Basil Dess   Encounter Date: 07/24/2017  PT End of Session - 07/24/17 1305    Visit Number  20    Number of Visits  24    Date for PT Re-Evaluation  08/10/17    PT Start Time  1103    PT Stop Time  1200    PT Time Calculation (min)  57 min    Activity Tolerance  Patient tolerated treatment well    Behavior During Therapy  [facility] for tasks assessed/performed       Past Medical History:  Diagnosis Date  . 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 Cerebrovascular accident [facility])    No clinical event; asymptomatic CT finding  . DDD (degenerative disc disease), cervical    Cervical  . Degenerative joint disease    Right hip and knee  . GERD (gastroesophageal reflux disease)   . Hip dislocation, right ([place])   . Hyperlipemia   . Hypertension   . Hypothyroid   . LBBB (left bundle branch block)   . S/P colonoscopy 2009   Dr. Sharlett Iles: reportedly normal per pt  . S/P endoscopy 1980s   per pt; normal. performed secondary to reflux    Past Surgical History:  Procedure Laterality Date  . ABDOMINAL HYSTERECTOMY    . APPENDECTOMY    . BI-VENTRICULAR IMPLANTABLE CARDIOVERTER DEFIBRILLATOR  (CRT-D)  10-31-2013   MDT Hillery Aldo CRTD implanted by Dr Lovena Le  . BREAST BIOPSY     bilaterally; benign  . can not have MRI due to jaw surgery    . CHOLECYSTECTOMY    . COLONOSCOPY  2009  . ESOPHAGOGASTRODUODENOSCOPY  07/13/2011   small hh, patchy gastric erythema with scarring deformity of antrum, chronic gastritis, bx benign with no H.Pylori. Procedure: ESOPHAGOGASTRODUODENOSCOPY (EGD);  Surgeon: Daneil Dolin,  MD;  [facility];  Service: Endoscopy;  Laterality: N/A;  10:45  . IMPLANTABLE CARDIOVERTER DEFIBRILLATOR IMPLANT N/A 10/31/2013   Procedure: IMPLANTABLE CARDIOVERTER DEFIBRILLATOR IMPLANT;  Surgeon: Evans Lance, MD;  Location: [facility] CATH LAB;  Service: Cardiovascular;  Laterality: N/A;  . MANDIBLE SURGERY     secondary to malocclusion  . THYROIDECTOMY     neoplasm  . Ureteral Surgery  1970    There were no vitals filed for this visit.  Subjective Assessment - 07/24/17 1107    Subjective  Has ben a 4/10 today.  has been doing exercises.    Did not have a headach yesterday.   Tried sitting in a chair to read 2 days and pain was worse with no head support.      Currently in Pain?  Yes    Pain Score  4     Pain Location  Neck    Pain Orientation  Right    Pain Descriptors / Indicators  Aching;Tightness;Tender    Pain Frequency  Constant    Aggravating Factors   reading    Pain Relieving Factors  rest with neck support,   manual    Multiple Pain Sites  -- left hip 5/10 ,  needs a replacement but they can't do surgery.  [place] Adult PT Treatment/Exercise - 07/24/17 0001      Neck Exercises: Supine   Neck Retraction  5 reps    Other Supine Exercise  head press,  scap squeeze with head press,    head press with tongue to roof of mouth and look with eyes over head without moving head.     5 x each.  (Cervical Stab 1 exercises)..  fist squeeze with head press,  isometric pec with head press and shoulder supination/ pronation with head press.  Pain resolved post exercises    Other Supine Exercise  shoulder extension right , yellow band 10 X,  ER yellow both 10 X      Moist Heat Therapy   Number Minutes Moist Heat  15 Minutes    Moist Heat Location  Cervical      Manual Therapy   Manual therapy comments  soft  tissue work right nech,  periscapular and shoulder.  supine semi reclined on wedge.,  tissue softened             PT Education  - 07/24/17 1300    Education provided  Yes    Education Details  HEP    Person(s) Educated  Patient    Methods  Verbal cues;Handout    Comprehension  Verbalized understanding;Returned demonstration       PT Short Term Goals - 07/24/17 1308      PT SHORT TERM GOAL #1   Title  Patient will increase builateral cervical rotation by 25 degrees     Baseline  worked on this supine,  Not yet increased 25 degrees    Time  4    Period  Weeks    Status  On-going      PT SHORT TERM GOAL #2   Title  Patient will demsotrate full pain free cervical flexion     Baseline  pain with rotation,  ROM limited    Time  4    Period  Weeks    Status  On-going      PT SHORT TERM GOAL #3   Title  Patient will demsogtrate 5/5 gross eright shoulder flexion     Time  4    Status  Unable to assess      PT SHORT TERM GOAL #4   Title  Patient will report decreased tenderness to palpation in her right upper trap     Baseline    No pain at end of session,  ?  consistant    Time  4    Period  Weeks    Status  On-going        PT Long Term Goals - 07/24/17 1311      PT LONG TERM GOAL #1   Title  Patient will be independent with exercise program to reduce spasming and inmprove posture    Baseline  independent with exercises issued so far    Time  8    Period  Weeks    Status  On-going      PT LONG TERM GOAL #2   Title  Patient will report no ram horn headaches for 1 week    Baseline  No Ha today,  Off and on    Time  8    Period  Weeks    Status  On-going      PT LONG TERM GOAL #3   Title  Patient wilkl demsotrate 60 degrees of rotation bilateral without pain in order to drive safely  Time  8    Period  Weeks    Status  Unable to assess            Plan - 07/24/17 1306    Clinical Impression Statement  Patient was able to have no pain at end of session post session. Mild pain continued post manual,  No pain post exercise.  Sitting in another chair to read  did not help pain in the  evening.      PT Next Visit Plan  manual,  ROM  work toward goals.  Remember KX modifier  Review new exercises    PT Home Exercise Plan  scap retraction; cervical rotation; upper trap stretch,  cervical stabilization 1 exercises    Consulted and Agree with Plan of Care  Patient       Patient will benefit from skilled therapeutic intervention in order to improve the following deficits and impairments:     Visit Diagnosis: Cervicalgia  Other muscle spasm     Problem List Patient Active Problem List   Diagnosis Date Noted  . 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 Atrial fibrillation ([place]) 11/17/2013  . Chronic systolic heart failure ([place]) 10/13/2013  . Hyponatremia 03/04/2013  . Constipation 09/13/2011  . GERD 09/13/2010  . HYPOTHYROIDISM 04/28/2009  . HYPERLIPIDEMIA 04/28/2009  . Hypertension 04/28/2009  . CARDIOMYOPATHY 04/28/2009  . LEFT BUNDLE BRANCH BLOCK 04/28/2009  . DEGENERATIVE JOINT DISEASE 04/28/2009    Endrit Gittins  PTA 07/24/2017, 1:12 PM  [facility] [address] [place], [name], [address] Phone: [phone]   Fax:  [phone]  Name: [name] MRN: [id number] Date of Birth: 01/25/1941

## 2017-07-26 ENCOUNTER — Encounter: Payer: Medicare Other | Admitting: Physical Therapy

## 2017-07-27 ENCOUNTER — Ambulatory Visit: Payer: Medicare Other | Admitting: Physical Therapy

## 2017-07-27 ENCOUNTER — Encounter: Payer: Self-pay | Admitting: Physical Therapy

## 2017-07-27 DIAGNOSIS — M542 Cervicalgia: Secondary | ICD-10-CM | POA: Diagnosis not present

## 2017-07-27 DIAGNOSIS — M62838 Other muscle spasm: Secondary | ICD-10-CM

## 2017-07-27 NOTE — Therapy (Signed)
[place], [name], [address] Phone: [phone]   Fax:  [phone]  Physical Therapy Treatment/Discharge   Patient Details  Name: [name] MRN: [id number] Date of Birth: 02-17-1941 Referring Provider: Dr Basil Dess   Encounter Date: 07/27/2017  PT End of Session - 07/27/17 1026    Visit Number  21    Number of Visits  24    Date for PT Re-Evaluation  08/10/17    Authorization Type  Medicare traid  G-code and re-assessment next visit     PT Start Time  1015    PT Stop Time  1110    PT Time Calculation (min)  55 min    Activity Tolerance  Patient tolerated treatment well    Behavior During Therapy  [facility] for tasks assessed/performed       Past Medical History:  Diagnosis Date  . 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 Cerebrovascular accident [facility])    No clinical event; asymptomatic CT finding  . DDD (degenerative disc disease), cervical    Cervical  . Degenerative joint disease    Right hip and knee  . GERD (gastroesophageal reflux disease)   . Hip dislocation, right ([place])   . Hyperlipemia   . Hypertension   . Hypothyroid   . LBBB (left bundle branch block)   . S/P colonoscopy 2009   Dr. Sharlett Iles: reportedly normal per pt  . S/P endoscopy 1980s   per pt; normal. performed secondary to reflux    Past Surgical History:  Procedure Laterality Date  . ABDOMINAL HYSTERECTOMY    . APPENDECTOMY    . BI-VENTRICULAR IMPLANTABLE CARDIOVERTER DEFIBRILLATOR  (CRT-D)  10-31-2013   MDT Hillery Aldo CRTD implanted by Dr Lovena Le  . BREAST BIOPSY     bilaterally; benign  . can not have MRI due to jaw surgery    . CHOLECYSTECTOMY    . COLONOSCOPY  2009  . ESOPHAGOGASTRODUODENOSCOPY  07/13/2011   small hh, patchy gastric erythema with scarring deformity of antrum, chronic gastritis, bx benign  with no H.Pylori. Procedure: ESOPHAGOGASTRODUODENOSCOPY (EGD);  Surgeon: Daneil Dolin, MD;  [facility];  Service: Endoscopy;  Laterality: N/A;  10:45  . IMPLANTABLE CARDIOVERTER DEFIBRILLATOR IMPLANT N/A 10/31/2013   Procedure: IMPLANTABLE CARDIOVERTER DEFIBRILLATOR IMPLANT;  Surgeon: Evans Lance, MD;  Location: [facility] CATH LAB;  Service: Cardiovascular;  Laterality: N/A;  . MANDIBLE SURGERY     secondary to malocclusion  . THYROIDECTOMY     neoplasm  . Ureteral Surgery  1970    There were no vitals filed for this visit.  Subjective Assessment - 07/27/17 1135    Subjective  Patient reports her pain has been better the past few days. She was sore after the last visit but then things got better. She has pain on the left sside today because she triesd sleeping on her right sidea few nights ago,     Currently in Pain?  Yes    Pain Score  3     Pain Location  Neck    Pain Orientation  Left    Pain Descriptors / Indicators  Aching;Tightness;Tender    Pain Type  Chronic pain    Pain Onset  More than a month ago    Pain Frequency  Constant    Aggravating Factors   reading     Pain Relieving Factors  rest     Effect of Pain on Daily Activities  limits reading                               PT Education - 07/27/17 1139    Education provided  Yes    Education Details  reviewed HEP, reviewed symptom mangement     Person(s) Educated  Patient    Methods  Explanation;Demonstration;Tactile cues;Verbal cues;Handout    Comprehension  Verbalized understanding;Returned demonstration;Verbal cues required;Tactile cues required       PT Short Term Goals - 07/27/17 1033      PT SHORT TERM GOAL #1   Title  Patient will increase builateral cervical rotation by 25 degrees     Baseline  Has not increased     Time  4    Period  Weeks    Status  Not Met      PT SHORT TERM GOAL #2   Title  Patient will demsotrate full pain free cervical flexion     Baseline   continues to have limited motionwith pain     Time  4    Period  Weeks    Status  Not Met      PT SHORT TERM GOAL #3   Title  Patient will demsogtrate 5/5 gross eright shoulder flexion     Baseline  strength improving but goal not met    Time  4    Period  Weeks    Status  Not Met      PT SHORT TERM GOAL #4   Title  Patient will report decreased tenderness to palpation in her right upper trap     Baseline    No pain at end of session,  ?  consistant    Time  4    Period  Weeks    Status  Not Met        PT Long Term Goals - 07/27/17 1036      PT LONG TERM GOAL #1   Title  Patient will be independent with exercise program to reduce spasming and inmprove posture    Baseline  independent with exercises issued     Time  8    Period  Weeks    Status  Achieved      PT LONG TERM GOAL #2   Title  Patient will report no ram horn headaches for 1 week    Baseline  The patient is having less headaches.     Time  8    Period  Weeks    Status  Achieved      PT LONG TERM GOAL #3   Title  Patient wilkl demsotrate 60 degrees of rotation bilateral without pain in order to drive safely     Baseline  39 right 25 left     Time  8    Period  Weeks    Status  Not Met            Plan - 07/27/17 1030    Clinical Impression Statement  At this point the patient has reached max potential for therapy. her FOTO score has decreased as well as her range. On this visit she feels like her pain is better but her pain tends to fluctuate. She was given a series of chintuck ecxercises to work on at home. She reports last visit they hurt but they may have helped over the last few  days.  Therapy reviewed home exercises. D/C to HEP at this time.    Clinical Presentation  Evolving    Clinical Decision Making  Moderate    Rehab Potential  Good    PT Frequency  2x / week    PT Duration  8 weeks    PT Treatment/Interventions  ADLs/Self Care Home Management;Cryotherapy;Electrical  Stimulation;Iontophoresis 107m/ml Dexamethasone;Therapeutic activities;Therapeutic exercise;Neuromuscular re-education;Patient/family education;Manual techniques;Splinting;Taping;Passive range of motion;Moist Heat;Traction;Ultrasound;Dry needling    PT Next Visit Plan  manual,  ROM  work toward goals.  Remember KX modifier  Review new exercises    PT Home Exercise Plan  scap retraction; cervical rotation; upper trap stretch,  cervical stabilization 1 exercises    Consulted and Agree with Plan of Care  Patient       Patient will benefit from skilled therapeutic intervention in order to improve the following deficits and impairments:  Abnormal gait, Pain, Increased edema, Decreased strength, Impaired UE functional use, Decreased endurance, Decreased activity tolerance, Decreased range of motion, Postural dysfunction  Visit Diagnosis: Cervicalgia  Other muscle spasm  PHYSICAL THERAPY DISCHARGE SUMMARY  Visits from Start of Care: 20  Current functional level related to goals / functional outcomes: Intermittent pain. More manageable then when she started but still limiting    Remaining deficits: Continued pain when sitting at night    Education / Equipment: HEP  Plan: Patient agrees to discharge.  Patient goals were not met. Patient is being discharged due to lack of progress.  ?????       Problem List Patient Active Problem List   Diagnosis Date Noted  . 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 Atrial fibrillation ([place] 11/17/2013  . [place] systolic heart failure ([place] 10/13/2013  . Hyponatremia 03/04/2013  . Constipation 09/13/2011  . GERD 09/13/2010  . HYPOTHYROIDISM 04/28/2009  . HYPERLIPIDEMIA 04/28/2009  . Hypertension 04/28/2009  . CARDIOMYOPATHY 04/28/2009  . LEFT BUNDLE BRANCH BLOCK 04/28/2009  . DEGENERATIVE JOINT DISEASE 04/28/2009    DCarney Living1/05/2018, 11:41 AM  [facility] [address]Phone: [phone]  Fax:  [phone] Name: DZOWIE LUNDAHLMRN: [id number] of Birth: [birth year]

## 2017-08-27 DIAGNOSIS — I5022 Chronic systolic (congestive) heart failure: Secondary | ICD-10-CM | POA: Diagnosis not present

## 2017-08-27 DIAGNOSIS — N183 Chronic kidney disease, stage 3 (moderate): Secondary | ICD-10-CM | POA: Diagnosis not present

## 2017-08-27 DIAGNOSIS — I4891 Unspecified atrial fibrillation: Secondary | ICD-10-CM | POA: Diagnosis not present

## 2017-08-27 DIAGNOSIS — I10 Essential (primary) hypertension: Secondary | ICD-10-CM | POA: Diagnosis not present

## 2017-08-29 ENCOUNTER — Other Ambulatory Visit: Payer: Self-pay | Admitting: Cardiovascular Disease

## 2017-08-30 DIAGNOSIS — I10 Essential (primary) hypertension: Secondary | ICD-10-CM | POA: Diagnosis not present

## 2017-08-30 DIAGNOSIS — N183 Chronic kidney disease, stage 3 (moderate): Secondary | ICD-10-CM | POA: Diagnosis not present

## 2017-08-30 DIAGNOSIS — I4891 Unspecified atrial fibrillation: Secondary | ICD-10-CM | POA: Diagnosis not present

## 2017-08-30 DIAGNOSIS — I5022 Chronic systolic (congestive) heart failure: Secondary | ICD-10-CM | POA: Diagnosis not present

## 2017-09-17 ENCOUNTER — Encounter: Payer: Self-pay | Admitting: Gastroenterology

## 2017-09-17 ENCOUNTER — Ambulatory Visit (INDEPENDENT_AMBULATORY_CARE_PROVIDER_SITE_OTHER): Payer: Medicare Other | Admitting: Gastroenterology

## 2017-09-17 VITALS — BP 182/92 | HR 62 | Temp 97.1°F | Ht 67.0 in | Wt 234.2 lb

## 2017-09-17 DIAGNOSIS — K5900 Constipation, unspecified: Secondary | ICD-10-CM | POA: Diagnosis not present

## 2017-09-17 DIAGNOSIS — R1011 Right upper quadrant pain: Secondary | ICD-10-CM

## 2017-09-17 DIAGNOSIS — R109 Unspecified abdominal pain: Secondary | ICD-10-CM | POA: Diagnosis not present

## 2017-09-17 DIAGNOSIS — I10 Essential (primary) hypertension: Secondary | ICD-10-CM

## 2017-09-17 DIAGNOSIS — K219 Gastro-esophageal reflux disease without esophagitis: Secondary | ICD-10-CM | POA: Diagnosis not present

## 2017-09-17 MED ORDER — OMEPRAZOLE 40 MG PO CPDR: 40.0000 mg | DELAYED_RELEASE_CAPSULE | Freq: Every day | ORAL | 5 refills | Status: DC

## 2017-09-17 MED ORDER — LINACLOTIDE 290 MCG PO CAPS: 290.0000 ug | ORAL_CAPSULE | Freq: Every day | ORAL | 5 refills | Status: DC

## 2017-09-17 NOTE — Patient Instructions (Signed)
1. Due to your insurance medication coverage we will first try to control acid reflux with increasing your omeprazole to 40mg  30 minutes before breakfast. RX sent to your pharmacy.  2. Due to your insurance medication coverage we will try Linzess 219mcg once daily on empty stomach before breakfast. Samples provided to try first. If you do not see good response in the first couple of weeks, we can try to get Trulance covered.  3. Call me in 1-2 weeks and let me know if your abdominal pain improves as your constipation is better managed. If not, we will consider further work up.  4. If symptoms worsen, please call sooner.  5. I will review recent labs from your family doctor.

## 2017-09-17 NOTE — Progress Notes (Addendum)
Primary Care Physician:  Sinda Du, MD  Primary Gastroenterologist:  Garfield Cornea, MD   Chief Complaint  Patient presents with  . Constipation    f/u. little pebbles  . Abdominal Pain    on right side    HPI:  [name] is a 77 y.o. female here for follow-up of constipation, GERD. Last seen in September 2018. She has a history of Gerd and constipation.  She states she is now on omeprazole over-the-counter which does not seem to be working as well as the Prevacid over-the-counter did. She states is no longer available. She was provided Movantik her last office visit. She did not try it up until about a month ago but states it didn't work at all. She had to take Dulcolax, six tablets before she finally had a bowel movement. Stools are infrequent. Hard, [place] one. Increased issues with constipation over the past 2 to 3 weeks. Complains of right flank pain which radiates into the right posterior back. Unrelated to meals or movement. Unable to reproduce symptoms with palpation. Denies dysuria. States recently her blood work showed her kidneys were functioning somewhat better than in the past but still not normal. She has a history of left kidney being damaged. He denies melena, rectal bleeding, vomiting. She's gained 10 pounds since November.  She has had a lot of back and neck issues. Has been going to PT had to quit because her symptoms were worsening. Son massages the knots out.  Last colonoscopy was in 2009 by Dr. Sharlett Iles. She states it was unremarkable.        Current Outpatient Medications  Medication Sig Dispense Refill  . benazepril (LOTENSIN) 40 MG tablet TAKE 1 TABLET BY MOUTH IN THE MORNING 90 tablet 3  . bisacodyl (DULCOLAX) 5 MG EC tablet Take 5 mg by mouth daily as needed for moderate constipation.    . carvedilol (COREG) 25 MG tablet TAKE ONE-HALF TABLET BY MOUTH IN THE MORNING AND ONE TABLET BY MOUTH IN THE EVENING/AT BEDTIME 60 tablet 3  . cholecalciferol  (VITAMIN D) 1000 UNITS tablet Take 1,000 Units by mouth daily.    . fexofenadine (ALLEGRA) 180 MG tablet Take 180 mg by mouth every morning.     . fluticasone (FLONASE) 50 MCG/ACT nasal spray Place 2 sprays into the nose daily as needed for allergies.     . furosemide (LASIX) 40 MG tablet TAKE ONE TABLET BY MOUTH TWICE DAILY AS DIRECTED 180 tablet 3  . levothyroxine (SYNTHROID, LEVOTHROID) 112 MCG tablet Take 112 mcg by mouth daily before breakfast.    . Multiple Vitamins-Minerals (CENTRUM SILVER PO) Take 1 tablet by mouth daily.     Marland Kitchen omeprazole (PRILOSEC) 20 MG capsule Take 20 mg by mouth daily.    . traMADol (ULTRAM) 50 MG tablet Take 50 mg by mouth every 6 (six) hours as needed for moderate pain.     Marland Kitchen XARELTO 20 MG TABS tablet TAKE 1 TABLET BY MOUTH ONCE DAILY WITH SUPPER 90 tablet 1   No current facility-administered medications for this visit.     Allergies as of 09/17/2017 - Review Complete 09/17/2017  Allergen Reaction Noted  . Ciprofloxacin Cough 10/17/2013  . Dexlansoprazole  09/13/2011  . Statins Other (See Comments) 05/30/2012    Past Medical History:  Diagnosis Date  . A-fib ([place])    on Xarelto  . 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 Cerebrovascular accident [facility])    No clinical event; asymptomatic CT finding  . DDD (degenerative disc disease), cervical    Cervical  . Degenerative joint disease    Right hip and knee  . GERD (gastroesophageal reflux disease)   . Hip dislocation, right ([place])   . Hyperlipemia   . Hypertension   . Hypothyroid   . LBBB (left bundle branch block)   . S/P colonoscopy 2009   Dr. Sharlett Iles: reportedly normal per pt  . S/P endoscopy 1980s   per pt; normal. performed secondary to reflux    Past Surgical History:  Procedure Laterality Date  . ABDOMINAL HYSTERECTOMY    . APPENDECTOMY    . BI-VENTRICULAR IMPLANTABLE  CARDIOVERTER DEFIBRILLATOR  (CRT-D)  10-31-2013   MDT Hillery Aldo CRTD implanted by Dr Lovena Le  . BREAST BIOPSY     bilaterally; benign  . can not have MRI due to jaw surgery    . CHOLECYSTECTOMY    . COLONOSCOPY  2009  . ESOPHAGOGASTRODUODENOSCOPY  07/13/2011   small hh, patchy gastric erythema with scarring deformity of antrum, chronic gastritis, bx benign with no H.Pylori. Procedure: ESOPHAGOGASTRODUODENOSCOPY (EGD);  Surgeon: Daneil Dolin, MD;  [facility];  Service: Endoscopy;  Laterality: N/A;  10:45  . IMPLANTABLE CARDIOVERTER DEFIBRILLATOR IMPLANT N/A 10/31/2013   Procedure: IMPLANTABLE CARDIOVERTER DEFIBRILLATOR IMPLANT;  Surgeon: Evans Lance, MD;  Location: [facility] CATH LAB;  Service: Cardiovascular;  Laterality: N/A;  . MANDIBLE SURGERY     secondary to malocclusion  . THYROIDECTOMY     neoplasm  . Ureteral Surgery  1970    Family History  Problem Relation Age of Onset  . Brain cancer Mother   . Atrial fibrillation Other   . Colon cancer Neg Hx     Social History   Socioeconomic History  . Marital status: Widowed    Spouse name: Not on file  . Number of children: 2  . Years of education: Not on file  . Highest education level: Not on file  Social Needs  . Financial resource strain: Not on file  . Food insecurity - worry: Not on file  . Food insecurity - inability: Not on file  . Transportation needs - medical: Not on file  . Transportation needs - non-medical: Not on file  Occupational History  . Not on file  Tobacco Use  . Smoking status: Never Smoker  . Smokeless tobacco: Never Used  Substance and Sexual Activity  . Alcohol use: No    Alcohol/week: 0.0 oz  . Drug use: No  . Sexual activity: No    Partners: Male  Other Topics Concern  . Not on file  Social History Narrative   Widowed            ROS:  General: Negative for anorexia, weight loss, fever, chills, fatigue, weakness. Eyes: Negative for vision changes.  ENT: Negative for  hoarseness, difficulty swallowing , nasal congestion. CV: Negative for chest pain, angina, palpitations, dyspnea on exertion, peripheral edema.  Respiratory: Negative for dyspnea at rest, dyspnea on exertion, cough, sputum, wheezing.  GI: See history of present illness. GU:  Negative for dysuria, hematuria, urinary incontinence, urinary frequency, nocturnal urination.  MS: Negative for joint pain, low back pain.  Derm: Negative for rash or itching.  Neuro: Negative for weakness, abnormal sensation, seizure, frequent headaches, memory loss, confusion.  Psych: Negative for anxiety, depression, suicidal ideation, hallucinations.  Endo: Negative for unusual weight change.  Heme: Negative for bruising  or bleeding. Allergy: Negative for rash or hives.    Physical Examination:  BP (!) 182/92 (BP Location: Left Arm, Cuff Size: Large)   Pulse 62   Temp (!) 97.1 F (36.2 C) (Oral)   Ht 5\' 7"  (1.702 m)   Wt 234 lb 3.2 oz (106.2 kg)   BMI 36.68 kg/m    General: Well-nourished, well-developed in no acute distress.  Head: Normocephalic, atraumatic.   Eyes: Conjunctiva pink, no icterus. Mouth: Oropharyngeal mucosa moist and pink , no lesions erythema or exudate. Neck: Supple without thyromegaly, masses, or lymphadenopathy.  Lungs: Clear to auscultation bilaterally.  Heart: Regular rate and rhythm, no murmurs rubs or gallops.  Abdomen: Bowel sounds are normal, nontender, nondistended, no hepatosplenomegaly or masses, no abdominal bruits or    hernia , no rebound or guarding. I was unable to reproduce her right upper quadrant/right flank pain with palpation. No CVA tenderness. Rectal: not performed Extremities: No lower extremity edema. No clubbing or deformities.  Neuro: Alert and oriented x 4 , grossly normal neurologically.  Skin: Warm and dry, no rash or jaundice.   Psych: Alert and cooperative, normal mood and affect.    Imaging Studies: No results found.

## 2017-09-18 ENCOUNTER — Encounter: Payer: Self-pay | Admitting: Gastroenterology

## 2017-09-18 ENCOUNTER — Other Ambulatory Visit: Payer: Self-pay

## 2017-09-18 DIAGNOSIS — R1011 Right upper quadrant pain: Secondary | ICD-10-CM

## 2017-09-18 NOTE — Assessment & Plan Note (Signed)
BP up today, somewhat improved with recheck. Still 182/92. Will have her discuss with PCP. Patient denies headache, dizziness, abnormal sensations.

## 2017-09-18 NOTE — Assessment & Plan Note (Addendum)
Somewhat limited by insurance coverage. Will try Linzess 237mcg daily. She will let me know if about 1-2 weeks how her constipation and right sided abd pain is doing. If persistent abd pain in setting of well managed constipation, she will need further work up.   Patient is somewhat reluctant to thought of colonoscopy as she will have to hold her Xarelto but she is willing.

## 2017-09-18 NOTE — Progress Notes (Signed)
Received labs from PCP.   She had creatinine of 0.99 (normal from 0.6 - 0.93) on 214 2019.   Please let the patient know, I would like to further evaluate her right upper quadrant pain with LFTs. If she is willing please [facility].   Also she needs to contact PCP about her elevated blood pressure yesterday unless she is able to check it at home and sees that it is closer to her baseline of 140/80.

## 2017-09-18 NOTE — Progress Notes (Signed)
cc'ed to pcp °

## 2017-09-18 NOTE — Progress Notes (Signed)
Pt notified. Will have labs done and will contact pcp. BP has gone down

## 2017-09-18 NOTE — Assessment & Plan Note (Signed)
Was doing well on lansoprazole but reports she can no longer get over the counter. RX for omeprazole 40mg  daily provided.

## 2017-09-19 ENCOUNTER — Other Ambulatory Visit: Payer: Self-pay | Admitting: Gastroenterology

## 2017-09-19 DIAGNOSIS — R1011 Right upper quadrant pain: Secondary | ICD-10-CM | POA: Diagnosis not present

## 2017-09-19 LAB — HEPATIC FUNCTION PANEL
AG RATIO: 1.6 (calc) (ref 1.0–2.5)
ALBUMIN MSPROF: 4.2 g/dL (ref 3.6–5.1)
ALT: 11 U/L (ref 6–29)
AST: 15 U/L (ref 10–35)
Alkaline phosphatase (APISO): 61 U/L (ref 33–130)
BILIRUBIN INDIRECT: 0.7 mg/dL (ref 0.2–1.2)
Bilirubin, Direct: 0.2 mg/dL (ref 0.0–0.2)
GLOBULIN: 2.6 g/dL (ref 1.9–3.7)
TOTAL PROTEIN: 6.8 g/dL (ref 6.1–8.1)
Total Bilirubin: 0.9 mg/dL (ref 0.2–1.2)

## 2017-09-20 ENCOUNTER — Ambulatory Visit (INDEPENDENT_AMBULATORY_CARE_PROVIDER_SITE_OTHER): Payer: Medicare Other | Admitting: *Deleted

## 2017-09-20 DIAGNOSIS — I429 Cardiomyopathy, unspecified: Secondary | ICD-10-CM

## 2017-09-20 NOTE — Progress Notes (Signed)
Remote ICD transmission.   

## 2017-09-21 ENCOUNTER — Encounter: Payer: Self-pay | Admitting: Cardiology

## 2017-09-24 ENCOUNTER — Other Ambulatory Visit (HOSPITAL_COMMUNITY): Payer: Self-pay | Admitting: Pulmonary Disease

## 2017-09-24 ENCOUNTER — Telehealth: Payer: Self-pay | Admitting: Cardiovascular Disease

## 2017-09-24 DIAGNOSIS — I10 Essential (primary) hypertension: Secondary | ICD-10-CM | POA: Diagnosis not present

## 2017-09-24 DIAGNOSIS — I4891 Unspecified atrial fibrillation: Secondary | ICD-10-CM | POA: Diagnosis not present

## 2017-09-24 DIAGNOSIS — I5022 Chronic systolic (congestive) heart failure: Secondary | ICD-10-CM | POA: Diagnosis not present

## 2017-09-24 DIAGNOSIS — R1011 Right upper quadrant pain: Secondary | ICD-10-CM | POA: Diagnosis not present

## 2017-09-24 NOTE — Telephone Encounter (Signed)
Error/tg °

## 2017-09-27 NOTE — Progress Notes (Signed)
Pt is aware that LFT's are normal. She said the constipation is better. Continues to have abdominal pain on the right side. The pain is constant and she rates it at a 5 now. Said it stays around a 5.  She had to go to Dr. Luan Pulling for her elevated BP and she told him about it and he has ordered at CT scan for Tuesday. Forwarding FYI to Neil Crouch, PA.

## 2017-09-27 NOTE — Progress Notes (Signed)
Please let patient know her LFTs are normal.  Is her constipation and abd pain improved any?

## 2017-09-29 LAB — CUP PACEART REMOTE DEVICE CHECK
Battery Voltage: 2.96 V
Brady Statistic AP VP Percent: 4.32 %
Brady Statistic AS VP Percent: 93.55 %
Brady Statistic RA Percent Paced: 4.26 %
Brady Statistic RV Percent Paced: 95.2 %
Date Time Interrogation Session: 20190307093623
HIGH POWER IMPEDANCE MEASURED VALUE: 78 Ohm
Implantable Lead Implant Date: 20150417
Implantable Lead Location: 753858
Implantable Lead Model: 4298
Implantable Lead Model: 5076
Implantable Pulse Generator Implant Date: 20150417
Lead Channel Impedance Value: 342 Ohm
Lead Channel Impedance Value: 361 Ohm
Lead Channel Impedance Value: 361 Ohm
Lead Channel Impedance Value: 418 Ohm
Lead Channel Impedance Value: 589 Ohm
Lead Channel Impedance Value: 589 Ohm
Lead Channel Impedance Value: 608 Ohm
Lead Channel Pacing Threshold Amplitude: 0.5 V
Lead Channel Pacing Threshold Amplitude: 0.75 V
Lead Channel Pacing Threshold Amplitude: 0.875 V
Lead Channel Pacing Threshold Pulse Width: 0.4 ms
Lead Channel Sensing Intrinsic Amplitude: 11.625 mV
Lead Channel Sensing Intrinsic Amplitude: 11.625 mV
Lead Channel Setting Pacing Amplitude: 2 V
Lead Channel Setting Pacing Amplitude: 2 V
Lead Channel Setting Sensing Sensitivity: 0.3 mV
MDC IDC LEAD IMPLANT DT: 20150417
MDC IDC LEAD IMPLANT DT: 20150417
MDC IDC LEAD LOCATION: 753859
MDC IDC LEAD LOCATION: 753860
MDC IDC MSMT BATTERY REMAINING LONGEVITY: 34 mo
MDC IDC MSMT LEADCHNL LV IMPEDANCE VALUE: 361 Ohm
MDC IDC MSMT LEADCHNL LV IMPEDANCE VALUE: 399 Ohm
MDC IDC MSMT LEADCHNL LV IMPEDANCE VALUE: 589 Ohm
MDC IDC MSMT LEADCHNL LV IMPEDANCE VALUE: 589 Ohm
MDC IDC MSMT LEADCHNL LV PACING THRESHOLD PULSEWIDTH: 0.4 ms
MDC IDC MSMT LEADCHNL RA PACING THRESHOLD PULSEWIDTH: 0.4 ms
MDC IDC MSMT LEADCHNL RA SENSING INTR AMPL: 1.875 mV
MDC IDC MSMT LEADCHNL RA SENSING INTR AMPL: 1.875 mV
MDC IDC MSMT LEADCHNL RV IMPEDANCE VALUE: 285 Ohm
MDC IDC MSMT LEADCHNL RV IMPEDANCE VALUE: 361 Ohm
MDC IDC SET LEADCHNL LV PACING PULSEWIDTH: 0.4 ms
MDC IDC SET LEADCHNL RV PACING AMPLITUDE: 2.5 V
MDC IDC SET LEADCHNL RV PACING PULSEWIDTH: 0.4 ms
MDC IDC STAT BRADY AP VS PERCENT: 0.03 %
MDC IDC STAT BRADY AS VS PERCENT: 2.1 %

## 2017-09-30 NOTE — Progress Notes (Signed)
Noted. Await CT findings.

## 2017-10-02 ENCOUNTER — Ambulatory Visit (HOSPITAL_COMMUNITY)
Admission: RE | Admit: 2017-10-02 | Discharge: 2017-10-02 | Disposition: A | Discharge status: Home / Self Care | Payer: Medicare Other | Source: Ambulatory Visit | Attending: Pulmonary Disease | Admitting: Pulmonary Disease

## 2017-10-02 DIAGNOSIS — R9389 Abnormal findings on diagnostic imaging of other specified body structures: Secondary | ICD-10-CM | POA: Insufficient documentation

## 2017-10-02 DIAGNOSIS — I700 Atherosclerosis of aorta: Secondary | ICD-10-CM | POA: Insufficient documentation

## 2017-10-02 DIAGNOSIS — R1010 Upper abdominal pain, unspecified: Secondary | ICD-10-CM | POA: Insufficient documentation

## 2017-10-02 DIAGNOSIS — I708 Atherosclerosis of other arteries: Secondary | ICD-10-CM | POA: Insufficient documentation

## 2017-10-02 DIAGNOSIS — R1011 Right upper quadrant pain: Secondary | ICD-10-CM

## 2017-10-02 MED ORDER — IOPAMIDOL (ISOVUE-300) INJECTION 61%
100.0000 mL | Freq: Once | INTRAVENOUS | Status: AC | PRN
  Administered 2017-10-02: 100 mL via INTRAVENOUS

## 2017-10-08 ENCOUNTER — Encounter: Payer: Self-pay | Admitting: Obstetrics & Gynecology

## 2017-10-08 DIAGNOSIS — R1900 Intra-abdominal and pelvic swelling, mass and lump, unspecified site: Secondary | ICD-10-CM | POA: Diagnosis not present

## 2017-10-23 ENCOUNTER — Encounter: Payer: Self-pay | Admitting: Obstetrics & Gynecology

## 2017-10-23 ENCOUNTER — Ambulatory Visit (INDEPENDENT_AMBULATORY_CARE_PROVIDER_SITE_OTHER): Payer: Medicare Other | Admitting: Obstetrics & Gynecology

## 2017-10-23 VITALS — BP 130/80 | HR 87 | Ht 67.0 in | Wt 229.0 lb

## 2017-10-23 DIAGNOSIS — R1900 Intra-abdominal and pelvic swelling, mass and lump, unspecified site: Secondary | ICD-10-CM

## 2017-10-23 NOTE — Progress Notes (Signed)
Chief Complaint  Patient presents with  . Referral    pelvic pain/ had CT Scan done      77 y.o. G2P0 No LMP recorded. Patient has had a hysterectomy. The current method of family planning is status post hysterectomy.  Outpatient Encounter Medications as of 10/23/2017  Medication Sig  . benazepril (LOTENSIN) 40 MG tablet TAKE 1 TABLET BY MOUTH IN THE MORNING  . cholecalciferol (VITAMIN D) 1000 UNITS tablet Take 1,000 Units by mouth daily.  . cloNIDine (CATAPRES) 0.1 MG tablet Take 0.1 mg by mouth 2 (two) times daily.  . fexofenadine (ALLEGRA) 180 MG tablet Take 180 mg by mouth every morning.   . fluticasone (FLONASE) 50 MCG/ACT nasal spray Place 2 sprays into the nose daily as needed for allergies.   . furosemide (LASIX) 40 MG tablet TAKE ONE TABLET BY MOUTH TWICE DAILY AS DIRECTED  . levothyroxine (SYNTHROID, LEVOTHROID) 112 MCG tablet Take 112 mcg by mouth daily before breakfast.  . linaclotide (LINZESS) 290 MCG CAPS capsule Take 1 capsule (290 mcg total) by mouth daily before breakfast.  . Multiple Vitamins-Minerals (CENTRUM SILVER PO) Take 1 tablet by mouth daily.   . traMADol (ULTRAM) 50 MG tablet Take 50 mg by mouth every 6 (six) hours as needed for moderate pain.   . [DISCONTINUED] carvedilol (COREG) 25 MG tablet TAKE ONE-HALF TABLET BY MOUTH IN THE MORNING AND ONE TABLET BY MOUTH IN THE EVENING/AT BEDTIME  . [DISCONTINUED] omeprazole (PRILOSEC) 40 MG capsule Take 1 capsule (40 mg total) by mouth daily before breakfast.  . [DISCONTINUED] XARELTO 20 MG TABS tablet TAKE 1 TABLET BY MOUTH ONCE DAILY WITH SUPPER  . [DISCONTINUED] bisacodyl (DULCOLAX) 5 MG EC tablet Take 5 mg by mouth daily as needed for moderate constipation.    No facility-administered encounter medications on file as of 10/23/2017.     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 A-fib ([place])    on Xarelto  . 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 Cerebrovascular accident [facility])    No clinical event; asymptomatic CT finding  . DDD (degenerative disc disease), cervical    Cervical  . Degenerative joint disease    Right hip and knee  . GERD (gastroesophageal reflux disease)   . Hip dislocation, right ([place])   . Hyperlipemia   . Hypertension   . Hypothyroid   . LBBB (left bundle branch block)   . S/P colonoscopy 2009   Dr. Sharlett Iles: reportedly normal per pt  . S/P endoscopy 1980s   per pt; normal. performed secondary to reflux    Past Surgical History:  Procedure Laterality Date  . ABDOMINAL HYSTERECTOMY    . APPENDECTOMY    . BI-VENTRICULAR IMPLANTABLE CARDIOVERTER DEFIBRILLATOR  (CRT-D)  10-31-2013   MDT Hillery Aldo CRTD implanted by Dr Lovena Le  . BREAST BIOPSY     bilaterally; benign  . can not have MRI due to jaw surgery    . CHOLECYSTECTOMY    . COLONOSCOPY  2009  . ESOPHAGOGASTRODUODENOSCOPY  07/13/2011   small hh, patchy gastric erythema with scarring deformity of antrum, chronic gastritis, bx benign with no H.Pylori. Procedure:  ESOPHAGOGASTRODUODENOSCOPY (EGD);  Surgeon: Daneil Dolin, MD;  [facility];  Service: Endoscopy;  Laterality: N/A;  10:45  . IMPLANTABLE CARDIOVERTER DEFIBRILLATOR IMPLANT N/A 10/31/2013   Procedure: IMPLANTABLE CARDIOVERTER DEFIBRILLATOR IMPLANT;  Surgeon: Evans Lance, MD;  Location: [facility] CATH LAB;  Service: Cardiovascular;  Laterality: N/A;  . MANDIBLE SURGERY     secondary to malocclusion  . THYROIDECTOMY     neoplasm  . Ureteral Surgery  1970    OB History    Gravida  2   Para      Term      Preterm      AB      Living        SAB      TAB      Ectopic      Multiple      Live  Births              Allergies  Allergen Reactions  . Ciprofloxacin Cough  . Dexlansoprazole     Lower abdominal pain.  . Statins Other (See Comments)    Arthralgias during treatment with at least 2 members of his class     Social History   Socioeconomic History  . Marital status: Widowed    Spouse name: Not on file  . Number of children: 2  . Years of education: Not on file  . Highest education level: Not on file  Occupational History  . Not on file  Social Needs  . Financial resource strain: Not on file  . Food insecurity:    Worry: Not on file    Inability: Not on file  . Transportation needs:    Medical: Not on file    Non-medical: Not on file  Tobacco Use  . Smoking status: Never Smoker  . Smokeless tobacco: Never Used  Substance and Sexual Activity  . Alcohol use: No    Alcohol/week: 0.0 standard drinks  . Drug use: No  . Sexual activity: Never    Partners: Male  Lifestyle  . Physical activity:    Days per week: Not on file    Minutes per session: Not on file  . Stress: Not on file  Relationships  . Social connections:    Talks on phone: Not on file    Gets together: Not on file    Attends religious service: Not on file    Active member of club or organization: Not on file    Attends meetings of clubs or organizations: Not on file    Relationship status: Not on file  Other Topics Concern  . Not on file  Social History Narrative   Widowed          Family History  Problem Relation Age of Onset  . Brain cancer Mother   . Atrial fibrillation Other   . Colon cancer Neg Hx     Medications:       Current Outpatient Medications:  .  benazepril (LOTENSIN) 40 MG tablet, TAKE 1 TABLET BY MOUTH IN THE MORNING, Disp: 90 tablet, Rfl: 3 .  cholecalciferol (VITAMIN D) 1000 UNITS tablet, Take 1,000 Units by mouth daily., Disp: , Rfl:  .  cloNIDine (CATAPRES) 0.1 MG tablet, Take 0.1 mg by mouth 2 (two) times daily., Disp: , Rfl:  .  fexofenadine  (ALLEGRA) 180 MG tablet, Take 180 mg by mouth every morning. , Disp: , Rfl:  .  fluticasone (FLONASE) 50 MCG/ACT nasal spray, Place 2 sprays into the nose  daily as needed for allergies. , Disp: , Rfl:  .  furosemide (LASIX) 40 MG tablet, TAKE ONE TABLET BY MOUTH TWICE DAILY AS DIRECTED, Disp: 180 tablet, Rfl: 3 .  levothyroxine (SYNTHROID, LEVOTHROID) 112 MCG tablet, Take 112 mcg by mouth daily before breakfast., Disp: , Rfl:  .  linaclotide (LINZESS) 290 MCG CAPS capsule, Take 1 capsule (290 mcg total) by mouth daily before breakfast., Disp: 30 capsule, Rfl: 5 .  Multiple Vitamins-Minerals (CENTRUM SILVER PO), Take 1 tablet by mouth daily. , Disp: , Rfl:  .  traMADol (ULTRAM) 50 MG tablet, Take 50 mg by mouth every 6 (six) hours as needed for moderate pain. , Disp: , Rfl:  .  carvedilol (COREG) 25 MG tablet, TAKE ONE-HALF TABLET BY MOUTH IN THE MORNING AND ONE TABLET BY MOUTH IN THE EVENING/AT BEDTIME, Disp: 60 tablet, Rfl: 11 .  omeprazole (PRILOSEC) 40 MG capsule, TAKE 1 CAPSULE BY MOUTH ONCE DAILY BEFORE BREAKFAST, Disp: 90 capsule, Rfl: 1 .  ondansetron (ZOFRAN) 4 MG tablet, Take 1 tablet by mouth as needed., Disp: , Rfl: 12 .  XARELTO 20 MG TABS tablet, TAKE 1 TABLET BY MOUTH ONCE DAILY WITH  SUPPER, Disp: 90 tablet, Rfl: 2  Objective Blood pressure 130/80, pulse 87, height 5\' 7"  (1.702 m), weight 229 lb (103.9 kg).  Gen wdwn nad  Pertinent ROS No burning with urination, frequency or urgency No nausea, vomiting or diarrhea Nor fever chills or other constitutional symptoms   Labs or studies reviewed    Impression Diagnoses this Encounter::   ICD-10-CM   1. Pelvic mass in female R19.00    impression is peritoneal inclusion cyst    Established relevant diagnosis(es):   Plan/Recommendations: No orders of the defined types were placed in this encounter.   Labs or Scans Ordered: No orders of the defined types were placed in this encounter.   Management:: Pt to get  repeat scan in 3 months, no follow up until that exam  Follow up Return if symptoms worsen or fail to improve.          All questions were answered.

## 2017-11-05 ENCOUNTER — Other Ambulatory Visit: Payer: Self-pay | Admitting: Cardiovascular Disease

## 2017-11-05 LAB — HM MAMMOGRAPHY

## 2017-12-18 ENCOUNTER — Other Ambulatory Visit (HOSPITAL_COMMUNITY): Payer: Self-pay | Admitting: Pulmonary Disease

## 2017-12-18 DIAGNOSIS — I10 Essential (primary) hypertension: Secondary | ICD-10-CM | POA: Diagnosis not present

## 2017-12-18 DIAGNOSIS — R1900 Intra-abdominal and pelvic swelling, mass and lump, unspecified site: Secondary | ICD-10-CM | POA: Diagnosis not present

## 2017-12-18 DIAGNOSIS — I5022 Chronic systolic (congestive) heart failure: Secondary | ICD-10-CM | POA: Diagnosis not present

## 2017-12-18 DIAGNOSIS — I429 Cardiomyopathy, unspecified: Secondary | ICD-10-CM | POA: Diagnosis not present

## 2017-12-20 ENCOUNTER — Ambulatory Visit (INDEPENDENT_AMBULATORY_CARE_PROVIDER_SITE_OTHER): Payer: Medicare Other | Admitting: *Deleted

## 2017-12-20 DIAGNOSIS — I429 Cardiomyopathy, unspecified: Secondary | ICD-10-CM

## 2017-12-20 DIAGNOSIS — I5022 Chronic systolic (congestive) heart failure: Secondary | ICD-10-CM

## 2017-12-20 NOTE — Progress Notes (Signed)
Remote ICD transmission.   

## 2017-12-25 ENCOUNTER — Ambulatory Visit (INDEPENDENT_AMBULATORY_CARE_PROVIDER_SITE_OTHER): Payer: Medicare Other | Admitting: Internal Medicine

## 2017-12-25 ENCOUNTER — Encounter: Payer: Self-pay | Admitting: Internal Medicine

## 2017-12-25 VITALS — BP 154/86 | HR 67 | Ht 67.0 in | Wt 237.0 lb

## 2017-12-25 DIAGNOSIS — I10 Essential (primary) hypertension: Secondary | ICD-10-CM

## 2017-12-25 DIAGNOSIS — I480 Paroxysmal atrial fibrillation: Secondary | ICD-10-CM | POA: Diagnosis not present

## 2017-12-25 DIAGNOSIS — I5022 Chronic systolic (congestive) heart failure: Secondary | ICD-10-CM | POA: Diagnosis not present

## 2017-12-25 DIAGNOSIS — Z95810 Presence of automatic (implantable) cardiac defibrillator: Secondary | ICD-10-CM

## 2017-12-25 NOTE — Progress Notes (Addendum)
HPI [name] returns today for followup. She is a pleasant 77 yo woman who has LBBB, chronic systolic heart failure and atrial fib. Her ICD insertion site was previously very tender but has slowly improved. Her dyspnea is improved but still not as good as she would like. She denies palpitations. She has been found to have a pelvic mass and is undergoing workup. She denies weight loss.   Allergies  Allergen Reactions  . Ciprofloxacin Cough  . Dexlansoprazole     Lower abdominal pain.  . Statins Other (See Comments)    Arthralgias during treatment with at least 2 members of his class      Current Outpatient Medications  Medication Sig Dispense Refill  . benazepril (LOTENSIN) 40 MG tablet TAKE 1 TABLET BY MOUTH IN THE MORNING 90 tablet 3  . carvedilol (COREG) 25 MG tablet TAKE ONE-HALF TABLET BY MOUTH IN THE MORNING AND ONE TABLET BY MOUTH IN THE EVENING/AT BEDTIME 60 tablet 11  . cholecalciferol (VITAMIN D) 1000 UNITS tablet Take 1,000 Units by mouth daily.    . cloNIDine (CATAPRES) 0.1 MG tablet Take 0.1 mg by mouth 2 (two) times daily.    . fexofenadine (ALLEGRA) 180 MG tablet Take 180 mg by mouth every morning.     . fluticasone (FLONASE) 50 MCG/ACT nasal spray Place 2 sprays into the nose daily as needed for allergies.     . furosemide (LASIX) 40 MG tablet TAKE ONE TABLET BY MOUTH TWICE DAILY AS DIRECTED 180 tablet 3  . levothyroxine (SYNTHROID, LEVOTHROID) 112 MCG tablet Take 112 mcg by mouth daily before breakfast.    . linaclotide (LINZESS) 290 MCG CAPS capsule Take 1 capsule (290 mcg total) by mouth daily before breakfast. 30 capsule 5  . Multiple Vitamins-Minerals (CENTRUM SILVER PO) Take 1 tablet by mouth daily.     Marland Kitchen omeprazole (PRILOSEC) 40 MG capsule Take 1 capsule (40 mg total) by mouth daily before breakfast. 30 capsule 5  . ondansetron (ZOFRAN) 4 MG tablet Take 1 tablet by mouth as needed.  12  . traMADol (ULTRAM) 50 MG tablet Take 50 mg by mouth every 6 (six)  hours as needed for moderate pain.     Marland Kitchen XARELTO 20 MG TABS tablet TAKE 1 TABLET BY MOUTH ONCE DAILY WITH SUPPER 90 tablet 1   No current facility-administered medications for this visit.      Past Medical History:  Diagnosis Date  . A-fib ([place])    on Xarelto  . 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 Cerebrovascular accident [facility])    No clinical event; asymptomatic CT finding  . DDD (degenerative disc disease), cervical    Cervical  . Degenerative joint disease    Right hip and knee  . GERD (gastroesophageal reflux disease)   . Hip dislocation, right ([place])   . Hyperlipemia   . Hypertension   . Hypothyroid   . LBBB (left bundle branch block)   . S/P colonoscopy 2009   Dr. Sharlett Iles: reportedly normal per pt  . S/P endoscopy 1980s   per pt; normal. performed secondary to reflux    ROS:   All systems reviewed and negative except as noted in the HPI.   Past Surgical History:  Procedure Laterality Date  . ABDOMINAL HYSTERECTOMY    . APPENDECTOMY    . BI-VENTRICULAR IMPLANTABLE CARDIOVERTER DEFIBRILLATOR  (CRT-D)  10-31-2013  MDT Hillery Aldo CRTD implanted by Dr Lovena Le  . BREAST BIOPSY     bilaterally; benign  . can not have MRI due to jaw surgery    . CHOLECYSTECTOMY    . COLONOSCOPY  2009  . ESOPHAGOGASTRODUODENOSCOPY  07/13/2011   small hh, patchy gastric erythema with scarring deformity of antrum, chronic gastritis, bx benign with no H.Pylori. Procedure: ESOPHAGOGASTRODUODENOSCOPY (EGD);  Surgeon: Daneil Dolin, MD;  [facility];  Service: Endoscopy;  Laterality: N/A;  10:45  . IMPLANTABLE CARDIOVERTER DEFIBRILLATOR IMPLANT N/A 10/31/2013   Procedure: IMPLANTABLE CARDIOVERTER DEFIBRILLATOR IMPLANT;  Surgeon: Evans Lance, MD;  Location: [facility] CATH LAB;  Service: Cardiovascular;  Laterality: N/A;  . MANDIBLE SURGERY     secondary to malocclusion  .  THYROIDECTOMY     neoplasm  . Ureteral Surgery  1970     Family History  Problem Relation Age of Onset  . Brain cancer Mother   . Atrial fibrillation Other   . Colon cancer Neg Hx      Social History   Socioeconomic History  . Marital status: Widowed    Spouse name: Not on file  . Number of children: 2  . Years of education: Not on file  . Highest education level: Not on file  Occupational History  . Not on file  Social Needs  . Financial resource strain: Not on file  . Food insecurity:    Worry: Not on file    Inability: Not on file  . Transportation needs:    Medical: Not on file    Non-medical: Not on file  Tobacco Use  . Smoking status: Never Smoker  . Smokeless tobacco: Never Used  Substance and Sexual Activity  . Alcohol use: No    Alcohol/week: 0.0 oz  . Drug use: No  . Sexual activity: Never    Partners: Male  Lifestyle  . Physical activity:    Days per week: Not on file    Minutes per session: Not on file  . Stress: Not on file  Relationships  . Social connections:    Talks on phone: Not on file    Gets together: Not on file    Attends religious service: Not on file    Active member of club or organization: Not on file    Attends meetings of clubs or organizations: Not on file    Relationship status: Not on file  . Intimate partner violence:    Fear of current or ex partner: Not on file    Emotionally abused: Not on file    Physically abused: Not on file    Forced sexual activity: Not on file  Other Topics Concern  . Not on file  Social History Narrative   Widowed           BP (!) 154/86   Pulse 67   Ht 5\' 7"  (1.702 m)   Wt 237 lb (107.5 kg)   SpO2 95%   BMI 37.12 kg/m   Physical Exam:  Well appearing obese 77 yo woman, NAD HEENT: Unremarkable Neck:  6 cm JVD, no thyromegally Lymphatics:  No adenopathy Back:  No CVA tenderness Lungs:  Clear with no wheezes HEART:  Regular rate rhythm, no murmurs, no rubs, no clicks Abd:   soft, positive bowel sounds, no organomegally, no rebound, no guarding Ext:  2 plus pulses, no edema, no cyanosis, no clubbing Skin:  No rashes no nodules Neuro:  CN II through XII intact, motor grossly intact  EKG - nsr with biv pacing  DEVICE  Normal device function.  See PaceArt for details.   Assess/Plan: 1. ICD - her Medtronic Biv ICD is working normally. Will recheck in several months. 2. Chronic systolic heart failure - her symptoms are class 2. She will continue her current meds. 3. Obesity - she is encouraged to lose weight.  4. Preoperative eval - the patient is low risk for major cardiac complications.   Mikle Bosworth.D.

## 2017-12-25 NOTE — Patient Instructions (Addendum)
Medication Instructions:  Your physician recommends that you continue on your current medications as directed. Please refer to the Current Medication list given to you today.  Labwork: None ordered.  Testing/Procedures: None ordered.  Follow-Up: Your physician wants you to follow-up in: one year with Dr. Lovena Le in [place].   You will receive a reminder letter in the mail two months in advance. If you don't receive a letter, please call our office to schedule the follow-up appointment.  Remote monitoring is used to monitor your ICD from home. This monitoring reduces the number of office visits required to check your device to one time per year. It allows [place] to keep an eye on the functioning of your device to ensure it is working properly. You are scheduled for a device check from home on 03/21/2018. You may send your transmission at any time that day. If you have a wireless device, the transmission will be sent automatically. After your physician reviews your transmission, you will receive a postcard with your next transmission date.  Any Other Special Instructions Will Be Listed Below (If Applicable).  If you need a refill on your cardiac medications before your next appointment, please call your pharmacy.

## 2017-12-27 DIAGNOSIS — H25813 Combined forms of age-related cataract, bilateral: Secondary | ICD-10-CM | POA: Diagnosis not present

## 2017-12-27 DIAGNOSIS — H3540 Unspecified peripheral retinal degeneration: Secondary | ICD-10-CM | POA: Diagnosis not present

## 2018-01-02 ENCOUNTER — Ambulatory Visit (HOSPITAL_COMMUNITY)
Admission: RE | Admit: 2018-01-02 | Discharge: 2018-01-02 | Disposition: A | Discharge status: Home / Self Care | Payer: Medicare Other | Source: Ambulatory Visit | Attending: Pulmonary Disease | Admitting: Pulmonary Disease

## 2018-01-02 ENCOUNTER — Encounter (HOSPITAL_COMMUNITY): Payer: Self-pay

## 2018-01-02 DIAGNOSIS — R1900 Intra-abdominal and pelvic swelling, mass and lump, unspecified site: Secondary | ICD-10-CM

## 2018-01-02 DIAGNOSIS — I700 Atherosclerosis of aorta: Secondary | ICD-10-CM | POA: Diagnosis not present

## 2018-01-02 MED ORDER — IOPAMIDOL (ISOVUE-300) INJECTION 61%
100.0000 mL | Freq: Once | INTRAVENOUS | Status: AC | PRN
  Administered 2018-01-02: 100 mL via INTRAVENOUS

## 2018-01-07 LAB — CUP PACEART INCLINIC DEVICE CHECK
Battery Voltage: 2.94 V
Brady Statistic AP VP Percent: 5.79 %
Brady Statistic RA Percent Paced: 5.66 %
Date Time Interrogation Session: 20190611161638
HIGH POWER IMPEDANCE MEASURED VALUE: 72 Ohm
Implantable Lead Implant Date: 20150417
Implantable Lead Implant Date: 20150417
Implantable Lead Location: 753858
Implantable Lead Location: 753859
Implantable Lead Location: 753860
Implantable Lead Model: 4298
Implantable Lead Model: 6935
Implantable Pulse Generator Implant Date: 20150417
Lead Channel Impedance Value: 342 Ohm
Lead Channel Impedance Value: 399 Ohm
Lead Channel Impedance Value: 456 Ohm
Lead Channel Impedance Value: 646 Ohm
Lead Channel Impedance Value: 665 Ohm
Lead Channel Impedance Value: 665 Ohm
Lead Channel Impedance Value: 703 Ohm
Lead Channel Pacing Threshold Amplitude: 0.5 V
Lead Channel Pacing Threshold Amplitude: 0.875 V
Lead Channel Pacing Threshold Amplitude: 1.25 V
Lead Channel Pacing Threshold Pulse Width: 0.4 ms
Lead Channel Pacing Threshold Pulse Width: 0.4 ms
Lead Channel Sensing Intrinsic Amplitude: 2.125 mV
Lead Channel Sensing Intrinsic Amplitude: 9.75 mV
Lead Channel Setting Pacing Amplitude: 2.25 V
Lead Channel Setting Pacing Amplitude: 2.5 V
Lead Channel Setting Pacing Pulse Width: 0.4 ms
MDC IDC LEAD IMPLANT DT: 20150417
MDC IDC MSMT BATTERY REMAINING LONGEVITY: 30 mo
MDC IDC MSMT LEADCHNL LV IMPEDANCE VALUE: 399 Ohm
MDC IDC MSMT LEADCHNL LV IMPEDANCE VALUE: 399 Ohm
MDC IDC MSMT LEADCHNL LV IMPEDANCE VALUE: 418 Ohm
MDC IDC MSMT LEADCHNL LV IMPEDANCE VALUE: 418 Ohm
MDC IDC MSMT LEADCHNL LV IMPEDANCE VALUE: 608 Ohm
MDC IDC MSMT LEADCHNL RA SENSING INTR AMPL: 1.375 mV
MDC IDC MSMT LEADCHNL RV IMPEDANCE VALUE: 399 Ohm
MDC IDC MSMT LEADCHNL RV PACING THRESHOLD PULSEWIDTH: 0.4 ms
MDC IDC MSMT LEADCHNL RV SENSING INTR AMPL: 11.625 mV
MDC IDC SET LEADCHNL RA PACING AMPLITUDE: 2 V
MDC IDC SET LEADCHNL RV PACING PULSEWIDTH: 0.4 ms
MDC IDC SET LEADCHNL RV SENSING SENSITIVITY: 0.3 mV
MDC IDC STAT BRADY AP VS PERCENT: 0.02 %
MDC IDC STAT BRADY AS VP PERCENT: 92.09 %
MDC IDC STAT BRADY AS VS PERCENT: 2.1 %
MDC IDC STAT BRADY RV PERCENT PACED: 94.58 %

## 2018-02-06 LAB — CUP PACEART REMOTE DEVICE CHECK
Battery Remaining Longevity: 30 mo
Battery Voltage: 2.95 V
Brady Statistic AP VP Percent: 6.75 %
Brady Statistic AS VS Percent: 1.51 %
Brady Statistic RA Percent Paced: 6.58 %
Brady Statistic RV Percent Paced: 95.27 %
HIGH POWER IMPEDANCE MEASURED VALUE: 73 Ohm
Implantable Lead Implant Date: 20150417
Implantable Lead Implant Date: 20150417
Implantable Lead Implant Date: 20150417
Implantable Lead Location: 753858
Implantable Lead Model: 4298
Implantable Lead Model: 6935
Lead Channel Impedance Value: 285 Ohm
Lead Channel Impedance Value: 342 Ohm
Lead Channel Impedance Value: 399 Ohm
Lead Channel Impedance Value: 399 Ohm
Lead Channel Impedance Value: 608 Ohm
Lead Channel Impedance Value: 646 Ohm
Lead Channel Pacing Threshold Pulse Width: 0.4 ms
Lead Channel Sensing Intrinsic Amplitude: 1.875 mV
Lead Channel Sensing Intrinsic Amplitude: 7.75 mV
Lead Channel Sensing Intrinsic Amplitude: 7.75 mV
Lead Channel Setting Pacing Amplitude: 2.25 V
Lead Channel Setting Pacing Pulse Width: 0.4 ms
MDC IDC LEAD LOCATION: 753859
MDC IDC LEAD LOCATION: 753860
MDC IDC MSMT LEADCHNL LV IMPEDANCE VALUE: 342 Ohm
MDC IDC MSMT LEADCHNL LV IMPEDANCE VALUE: 361 Ohm
MDC IDC MSMT LEADCHNL LV IMPEDANCE VALUE: 399 Ohm
MDC IDC MSMT LEADCHNL LV IMPEDANCE VALUE: 608 Ohm
MDC IDC MSMT LEADCHNL LV IMPEDANCE VALUE: 646 Ohm
MDC IDC MSMT LEADCHNL LV IMPEDANCE VALUE: 646 Ohm
MDC IDC MSMT LEADCHNL LV PACING THRESHOLD AMPLITUDE: 1.125 V
MDC IDC MSMT LEADCHNL LV PACING THRESHOLD PULSEWIDTH: 0.4 ms
MDC IDC MSMT LEADCHNL RA IMPEDANCE VALUE: 418 Ohm
MDC IDC MSMT LEADCHNL RA PACING THRESHOLD AMPLITUDE: 0.5 V
MDC IDC MSMT LEADCHNL RA SENSING INTR AMPL: 1.875 mV
MDC IDC MSMT LEADCHNL RV PACING THRESHOLD AMPLITUDE: 0.875 V
MDC IDC MSMT LEADCHNL RV PACING THRESHOLD PULSEWIDTH: 0.4 ms
MDC IDC PG IMPLANT DT: 20150417
MDC IDC SESS DTM: 20190606062724
MDC IDC SET LEADCHNL RA PACING AMPLITUDE: 2 V
MDC IDC SET LEADCHNL RV PACING AMPLITUDE: 2.5 V
MDC IDC SET LEADCHNL RV PACING PULSEWIDTH: 0.4 ms
MDC IDC SET LEADCHNL RV SENSING SENSITIVITY: 0.3 mV
MDC IDC STAT BRADY AP VS PERCENT: 0.02 %
MDC IDC STAT BRADY AS VP PERCENT: 91.73 %

## 2018-02-10 ENCOUNTER — Other Ambulatory Visit: Payer: Self-pay | Admitting: Cardiovascular Disease

## 2018-02-13 ENCOUNTER — Other Ambulatory Visit: Payer: Self-pay

## 2018-02-28 ENCOUNTER — Other Ambulatory Visit: Payer: Self-pay | Admitting: Gastroenterology

## 2018-03-21 ENCOUNTER — Ambulatory Visit (INDEPENDENT_AMBULATORY_CARE_PROVIDER_SITE_OTHER): Payer: Medicare Other | Admitting: *Deleted

## 2018-03-21 DIAGNOSIS — I429 Cardiomyopathy, unspecified: Secondary | ICD-10-CM

## 2018-03-21 DIAGNOSIS — I5022 Chronic systolic (congestive) heart failure: Secondary | ICD-10-CM

## 2018-03-22 NOTE — Progress Notes (Signed)
Remote ICD transmission.   

## 2018-04-16 LAB — CUP PACEART REMOTE DEVICE CHECK
Battery Remaining Longevity: 25 mo
Brady Statistic RA Percent Paced: 5.04 %
HighPow Impedance: 78 Ohm
Implantable Lead Implant Date: 20150417
Implantable Lead Implant Date: 20150417
Implantable Lead Location: 753858
Implantable Lead Location: 753860
Implantable Lead Model: 4298
Implantable Lead Model: 5076
Lead Channel Impedance Value: 361 Ohm
Lead Channel Impedance Value: 361 Ohm
Lead Channel Impedance Value: 399 Ohm
Lead Channel Impedance Value: 399 Ohm
Lead Channel Impedance Value: 399 Ohm
Lead Channel Impedance Value: 418 Ohm
Lead Channel Impedance Value: 418 Ohm
Lead Channel Impedance Value: 646 Ohm
Lead Channel Impedance Value: 665 Ohm
Lead Channel Pacing Threshold Amplitude: 0.875 V
Lead Channel Pacing Threshold Amplitude: 1.125 V
Lead Channel Pacing Threshold Pulse Width: 0.4 ms
Lead Channel Pacing Threshold Pulse Width: 0.4 ms
Lead Channel Sensing Intrinsic Amplitude: 2 mV
Lead Channel Sensing Intrinsic Amplitude: 2 mV
Lead Channel Sensing Intrinsic Amplitude: 8.75 mV
Lead Channel Sensing Intrinsic Amplitude: 8.75 mV
Lead Channel Setting Pacing Amplitude: 2 V
Lead Channel Setting Pacing Amplitude: 2.25 V
Lead Channel Setting Pacing Pulse Width: 0.4 ms
Lead Channel Setting Pacing Pulse Width: 0.4 ms
Lead Channel Setting Sensing Sensitivity: 0.3 mV
MDC IDC LEAD IMPLANT DT: 20150417
MDC IDC LEAD LOCATION: 753859
MDC IDC MSMT BATTERY VOLTAGE: 2.94 V
MDC IDC MSMT LEADCHNL LV IMPEDANCE VALUE: 646 Ohm
MDC IDC MSMT LEADCHNL LV IMPEDANCE VALUE: 646 Ohm
MDC IDC MSMT LEADCHNL LV IMPEDANCE VALUE: 665 Ohm
MDC IDC MSMT LEADCHNL LV PACING THRESHOLD PULSEWIDTH: 0.4 ms
MDC IDC MSMT LEADCHNL RA PACING THRESHOLD AMPLITUDE: 0.5 V
MDC IDC MSMT LEADCHNL RV IMPEDANCE VALUE: 304 Ohm
MDC IDC PG IMPLANT DT: 20150417
MDC IDC SESS DTM: 20190905170746
MDC IDC SET LEADCHNL RV PACING AMPLITUDE: 2.5 V
MDC IDC STAT BRADY AP VP PERCENT: 5.15 %
MDC IDC STAT BRADY AP VS PERCENT: 0.02 %
MDC IDC STAT BRADY AS VP PERCENT: 92.62 %
MDC IDC STAT BRADY AS VS PERCENT: 2.22 %
MDC IDC STAT BRADY RV PERCENT PACED: 94.36 %

## 2018-05-07 ENCOUNTER — Ambulatory Visit (INDEPENDENT_AMBULATORY_CARE_PROVIDER_SITE_OTHER): Payer: Medicare Other | Admitting: Cardiovascular Disease

## 2018-05-07 ENCOUNTER — Encounter: Payer: Self-pay | Admitting: Cardiovascular Disease

## 2018-05-07 VITALS — BP 142/80 | HR 61 | Ht 69.0 in | Wt 241.0 lb

## 2018-05-07 DIAGNOSIS — Z23 Encounter for immunization: Secondary | ICD-10-CM

## 2018-05-07 DIAGNOSIS — I420 Dilated cardiomyopathy: Secondary | ICD-10-CM

## 2018-05-07 DIAGNOSIS — I5022 Chronic systolic (congestive) heart failure: Secondary | ICD-10-CM | POA: Diagnosis not present

## 2018-05-07 DIAGNOSIS — I480 Paroxysmal atrial fibrillation: Secondary | ICD-10-CM | POA: Diagnosis not present

## 2018-05-07 DIAGNOSIS — Z95810 Presence of automatic (implantable) cardiac defibrillator: Secondary | ICD-10-CM

## 2018-05-07 DIAGNOSIS — R600 Localized edema: Secondary | ICD-10-CM

## 2018-05-07 DIAGNOSIS — I10 Essential (primary) hypertension: Secondary | ICD-10-CM | POA: Diagnosis not present

## 2018-05-07 MED ORDER — HYDRALAZINE HCL 25 MG PO TABS: 25.0000 mg | ORAL_TABLET | Freq: Three times a day (TID) | ORAL | 3 refills | Status: DC

## 2018-05-07 MED ORDER — CARVEDILOL 25 MG PO TABS: 25.0000 mg | ORAL_TABLET | Freq: Two times a day (BID) | ORAL | 3 refills | Status: DC

## 2018-05-07 NOTE — Progress Notes (Signed)
SUBJECTIVE: The patient presents for routine follow-up.  She has a nonischemic cardiomyopathy with chronic systolic heart failure and a biventricular ICD as well as atrial fibrillation.  Echocardiogram in October 2018 demonstrated an improvement in left ventricular systolic function with LVEF up to 40% which had previously been 30% in July 2014.  She is chronically fatigued but is grateful that she is able to drive.  She denies palpitations, orthopnea, and paroxysmal nocturnal dyspnea.  She has some bilateral leg edema.  She is obese and has varicose veins.  Her blood pressure had been running high and her PCP started clonidine 0.1 mg twice daily and increase carvedilol to 25 mill grams twice daily.    Review of Systems: As per "subjective", otherwise negative.  Allergies  Allergen Reactions  . Ciprofloxacin Cough  . Dexlansoprazole     Lower abdominal pain.  . Statins Other (See Comments)    Arthralgias during treatment with at least 2 members of his class     Current Outpatient Medications  Medication Sig Dispense Refill  . benazepril (LOTENSIN) 40 MG tablet TAKE 1 TABLET BY MOUTH IN THE MORNING 90 tablet 3  . carvedilol (COREG) 25 MG tablet TAKE ONE-HALF TABLET BY MOUTH IN THE MORNING AND ONE TABLET BY MOUTH IN THE EVENING/AT BEDTIME (Patient taking differently: 25 mg. ) 60 tablet 11  . cholecalciferol (VITAMIN D) 1000 UNITS tablet Take 1,000 Units by mouth daily.    . fexofenadine (ALLEGRA) 180 MG tablet Take 180 mg by mouth every morning.     . fluticasone (FLONASE) 50 MCG/ACT nasal spray Place 2 sprays into the nose daily as needed for allergies.     . furosemide (LASIX) 40 MG tablet TAKE ONE TABLET BY MOUTH TWICE DAILY AS DIRECTED 180 tablet 3  . levothyroxine (SYNTHROID, LEVOTHROID) 112 MCG tablet Take 112 mcg by mouth daily before breakfast.    . Multiple Vitamins-Minerals (CENTRUM SILVER PO) Take 1 tablet by mouth daily.     Marland Kitchen omeprazole (PRILOSEC) 40 MG capsule  TAKE 1 CAPSULE BY MOUTH ONCE DAILY BEFORE BREAKFAST 90 capsule 1  . ondansetron (ZOFRAN) 4 MG tablet Take 1 tablet by mouth as needed.  12  . traMADol (ULTRAM) 50 MG tablet Take 50 mg by mouth every 6 (six) hours as needed for moderate pain.     Marland Kitchen XARELTO 20 MG TABS tablet TAKE 1 TABLET BY MOUTH ONCE DAILY WITH  SUPPER 90 tablet 2  . hydrALAZINE (APRESOLINE) 25 MG tablet Take 1 tablet (25 mg total) by mouth 3 (three) times daily. 270 tablet 3   No current facility-administered medications for this visit.     Past Medical History:  Diagnosis Date  . A-fib ([place])    on Xarelto  . 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 Cerebrovascular accident [facility])    No clinical event; asymptomatic CT finding  . DDD (degenerative disc disease), cervical    Cervical  . Degenerative joint disease    Right hip and knee  . GERD (gastroesophageal reflux disease)   . Hip dislocation, right ([place])   . Hyperlipemia   . Hypertension   . Hypothyroid   . LBBB (left bundle branch block)   . S/P colonoscopy 2009   Dr. Sharlett Iles: reportedly normal per pt  . S/P endoscopy 1980s   per pt; normal. performed secondary to reflux    Past Surgical  History:  Procedure Laterality Date  . ABDOMINAL HYSTERECTOMY    . APPENDECTOMY    . BI-VENTRICULAR IMPLANTABLE CARDIOVERTER DEFIBRILLATOR  (CRT-D)  10-31-2013   MDT Hillery Aldo CRTD implanted by Dr Lovena Le  . BREAST BIOPSY     bilaterally; benign  . can not have MRI due to jaw surgery    . CHOLECYSTECTOMY    . COLONOSCOPY  2009  . ESOPHAGOGASTRODUODENOSCOPY  07/13/2011   small hh, patchy gastric erythema with scarring deformity of antrum, chronic gastritis, bx benign with no H.Pylori. Procedure: ESOPHAGOGASTRODUODENOSCOPY (EGD);  Surgeon: Daneil Dolin, MD;  [facility];  Service: Endoscopy;  Laterality: N/A;  10:45  . IMPLANTABLE CARDIOVERTER  DEFIBRILLATOR IMPLANT N/A 10/31/2013   Procedure: IMPLANTABLE CARDIOVERTER DEFIBRILLATOR IMPLANT;  Surgeon: Evans Lance, MD;  Location: [facility] CATH LAB;  Service: Cardiovascular;  Laterality: N/A;  . MANDIBLE SURGERY     secondary to malocclusion  . THYROIDECTOMY     neoplasm  . Ureteral Surgery  1970    Social History   Socioeconomic History  . Marital status: Widowed    Spouse name: Not on file  . Number of children: 2  . Years of education: Not on file  . Highest education level: Not on file  Occupational History  . Not on file  Social Needs  . Financial resource strain: Not on file  . Food insecurity:    Worry: Not on file    Inability: Not on file  . Transportation needs:    Medical: Not on file    Non-medical: Not on file  Tobacco Use  . Smoking status: Never Smoker  . Smokeless tobacco: Never Used  Substance and Sexual Activity  . Alcohol use: No    Alcohol/week: 0.0 standard drinks  . Drug use: No  . Sexual activity: Never    Partners: Male  Lifestyle  . Physical activity:    Days per week: Not on file    Minutes per session: Not on file  . Stress: Not on file  Relationships  . Social connections:    Talks on phone: Not on file    Gets together: Not on file    Attends religious service: Not on file    Active member of club or organization: Not on file    Attends meetings of clubs or organizations: Not on file    Relationship status: Not on file  . Intimate partner violence:    Fear of current or ex partner: Not on file    Emotionally abused: Not on file    Physically abused: Not on file    Forced sexual activity: Not on file  Other Topics Concern  . Not on file  Social History Narrative   Widowed           Vitals:   05/07/18 0817  BP: (!) 142/80  Pulse: 61  SpO2: 93%  Weight: 241 lb (109.3 kg)  Height: 5\' 9"  (1.753 m)    Wt Readings from Last 3 Encounters:  05/07/18 241 lb (109.3 kg)  12/25/17 237 lb (107.5 kg)  10/23/17 229 lb (103.9  kg)     PHYSICAL EXAM General: NAD HEENT: Normal. Neck: No JVD, no thyromegaly. Lungs: Clear to auscultation bilaterally with normal respiratory effort. CV: Regular rate and rhythm, normal S1/S2, no S3/S4, no murmur.  Trace bilateral lower extremity edema.    Abdomen: Soft, nontender, no distention.  Neurologic: Alert and oriented.  Psych: Normal affect. Skin: Normal. Musculoskeletal: No gross deformities.  ECG: Reviewed above under Subjective   Labs: Lab Results  Component Value Date/Time   K 4.2 04/18/2016 10:27 AM   BUN 14 04/18/2016 10:27 AM   CREATININE 1.14 (H) 04/18/2016 10:27 AM   ALT 11 09/19/2017 08:28 AM   TSH 2.925 02/23/2014 02:41 PM   HGB 13.3 02/23/2014 02:41 PM     Lipids: Lab Results  Component Value Date/Time   LDLCALC 158 10/04/2009   CHOL 219 10/04/2009   TRIG 95 10/04/2009   HDL 42 10/04/2009       ASSESSMENT AND PLAN:  1. Nonischemic cardiomyopathy/chronic systolic heart failure with BiV ICD:  LVEF up to 40% in October 2018.   She had been on spironolactone at her last visit but this has since been discontinued.  She is on benazepril 40 mg and carvedilol 25 mg twice daily.  She takes Lasix 40 mg sometimes once daily and usually twice daily when the weather is hot and humid.  I will discontinue clonidine and start hydralazine 25 mg 3 times daily.  2. Essential HTN:  Blood pressure has been elevated and PCP initiated clonidine 0.1 mg twice daily.  She is chronically fatigued.  I will discontinue clonidine and start hydralazine 25 mg 3 times daily.  She is also on benazepril 40 mg and carvedilol 25 mg twice daily.  3. Atrial fibrillation: Symptomatically stable. Anticoagulated with Xarelto. HR controlled on Coreg.  4. BiV ICD in situ: Stable with normal device function.Follows with Dr. Lovena Le.  5.  Bilateral leg edema: She is obese and has venous varicosities and this is also contributing to her leg swelling.  I will prescribe knee-high  compression stockings 15-20 mmHg.   Disposition: Follow up with me in 1 year.  Follow-up with Dr. Lovena Le as previously scheduled.   Kate Sable, M.D., F.A.C.C.

## 2018-05-07 NOTE — Patient Instructions (Signed)
.  Medication Instructions:  STOP Clonidine  START Hydralazine 25 mg three times a day  If you need a refill on your cardiac medications before your next appointment, please call your pharmacy.   Lab work: NONE If you have labs (blood work) drawn today and your tests are completely normal, you will receive your results only by: Marland Kitchen MyChart Message (if you have MyChart) OR . A paper copy in the mail If you have any lab test that is abnormal or we need to change your treatment, we will call you to review the results.  Testing/Procedures: NONE  Follow-Up: . 1 year with Dr.Koneswaran  Any Other Special Instructions Will Be Listed Below (If Applicable). Wear compression stockings, we have given you a prescription for them today      Thank you for choosing [facility] !

## 2018-06-20 ENCOUNTER — Ambulatory Visit (INDEPENDENT_AMBULATORY_CARE_PROVIDER_SITE_OTHER): Payer: Medicare Other

## 2018-06-20 DIAGNOSIS — I420 Dilated cardiomyopathy: Secondary | ICD-10-CM | POA: Diagnosis not present

## 2018-06-20 NOTE — Progress Notes (Signed)
Remote ICD transmission.   

## 2018-06-25 ENCOUNTER — Other Ambulatory Visit (HOSPITAL_COMMUNITY): Payer: Self-pay | Admitting: Pulmonary Disease

## 2018-06-25 DIAGNOSIS — I4891 Unspecified atrial fibrillation: Secondary | ICD-10-CM | POA: Diagnosis not present

## 2018-06-25 DIAGNOSIS — I5022 Chronic systolic (congestive) heart failure: Secondary | ICD-10-CM | POA: Diagnosis not present

## 2018-06-25 DIAGNOSIS — I129 Hypertensive chronic kidney disease with stage 1 through stage 4 chronic kidney disease, or unspecified chronic kidney disease: Secondary | ICD-10-CM | POA: Diagnosis not present

## 2018-06-25 DIAGNOSIS — N183 Chronic kidney disease, stage 3 (moderate): Secondary | ICD-10-CM | POA: Diagnosis not present

## 2018-06-25 DIAGNOSIS — N9489 Other specified conditions associated with female genital organs and menstrual cycle: Secondary | ICD-10-CM

## 2018-06-26 ENCOUNTER — Encounter: Payer: Self-pay | Admitting: Cardiology

## 2018-07-24 ENCOUNTER — Ambulatory Visit (HOSPITAL_COMMUNITY)
Admission: RE | Admit: 2018-07-24 | Discharge: 2018-07-24 | Disposition: A | Discharge status: Home / Self Care | Payer: Medicare Other | Source: Ambulatory Visit | Attending: Pulmonary Disease | Admitting: Pulmonary Disease

## 2018-07-24 DIAGNOSIS — N9489 Other specified conditions associated with female genital organs and menstrual cycle: Secondary | ICD-10-CM

## 2018-07-24 DIAGNOSIS — R1909 Other intra-abdominal and pelvic swelling, mass and lump: Secondary | ICD-10-CM | POA: Diagnosis not present

## 2018-08-05 ENCOUNTER — Other Ambulatory Visit: Payer: Self-pay | Admitting: Cardiovascular Disease

## 2018-08-07 LAB — CUP PACEART REMOTE DEVICE CHECK
Battery Voltage: 2.94 V
Brady Statistic AP VP Percent: 6.46 %
Brady Statistic AP VS Percent: 0.02 %
Brady Statistic AS VP Percent: 90.64 %
Brady Statistic AS VS Percent: 2.87 %
HIGH POWER IMPEDANCE MEASURED VALUE: 74 Ohm
Implantable Lead Implant Date: 20150417
Implantable Lead Implant Date: 20150417
Implantable Lead Location: 753858
Implantable Lead Location: 753860
Implantable Lead Model: 5076
Lead Channel Impedance Value: 361 Ohm
Lead Channel Impedance Value: 361 Ohm
Lead Channel Impedance Value: 418 Ohm
Lead Channel Impedance Value: 551 Ohm
Lead Channel Impedance Value: 551 Ohm
Lead Channel Impedance Value: 589 Ohm
Lead Channel Impedance Value: 608 Ohm
Lead Channel Pacing Threshold Amplitude: 0.5 V
Lead Channel Pacing Threshold Amplitude: 0.875 V
Lead Channel Pacing Threshold Amplitude: 1.125 V
Lead Channel Pacing Threshold Pulse Width: 0.4 ms
Lead Channel Pacing Threshold Pulse Width: 0.4 ms
Lead Channel Pacing Threshold Pulse Width: 0.4 ms
Lead Channel Sensing Intrinsic Amplitude: 1.625 mV
Lead Channel Sensing Intrinsic Amplitude: 10.375 mV
Lead Channel Setting Sensing Sensitivity: 0.3 mV
MDC IDC LEAD IMPLANT DT: 20150417
MDC IDC LEAD LOCATION: 753859
MDC IDC MSMT BATTERY REMAINING LONGEVITY: 27 mo
MDC IDC MSMT LEADCHNL LV IMPEDANCE VALUE: 342 Ohm
MDC IDC MSMT LEADCHNL LV IMPEDANCE VALUE: 361 Ohm
MDC IDC MSMT LEADCHNL LV IMPEDANCE VALUE: 418 Ohm
MDC IDC MSMT LEADCHNL LV IMPEDANCE VALUE: 589 Ohm
MDC IDC MSMT LEADCHNL RA SENSING INTR AMPL: 1.625 mV
MDC IDC MSMT LEADCHNL RV IMPEDANCE VALUE: 304 Ohm
MDC IDC MSMT LEADCHNL RV IMPEDANCE VALUE: 342 Ohm
MDC IDC MSMT LEADCHNL RV SENSING INTR AMPL: 10.375 mV
MDC IDC PG IMPLANT DT: 20150417
MDC IDC SESS DTM: 20191205081805
MDC IDC SET LEADCHNL LV PACING AMPLITUDE: 2.25 V
MDC IDC SET LEADCHNL LV PACING PULSEWIDTH: 0.4 ms
MDC IDC SET LEADCHNL RA PACING AMPLITUDE: 2 V
MDC IDC SET LEADCHNL RV PACING AMPLITUDE: 2.5 V
MDC IDC SET LEADCHNL RV PACING PULSEWIDTH: 0.4 ms
MDC IDC STAT BRADY RA PERCENT PACED: 6.3 %
MDC IDC STAT BRADY RV PERCENT PACED: 93.36 %

## 2018-09-19 ENCOUNTER — Ambulatory Visit (INDEPENDENT_AMBULATORY_CARE_PROVIDER_SITE_OTHER): Payer: Medicare Other | Admitting: *Deleted

## 2018-09-19 DIAGNOSIS — I420 Dilated cardiomyopathy: Secondary | ICD-10-CM | POA: Diagnosis not present

## 2018-09-21 LAB — CUP PACEART REMOTE DEVICE CHECK
Battery Remaining Longevity: 26 mo
Brady Statistic AP VS Percent: 0.02 %
Brady Statistic AS VP Percent: 92.74 %
Brady Statistic AS VS Percent: 2.09 %
Brady Statistic RA Percent Paced: 5.05 %
HighPow Impedance: 74 Ohm
Implantable Lead Implant Date: 20150417
Implantable Lead Implant Date: 20150417
Implantable Lead Location: 753860
Lead Channel Impedance Value: 342 Ohm
Lead Channel Impedance Value: 342 Ohm
Lead Channel Impedance Value: 342 Ohm
Lead Channel Impedance Value: 399 Ohm
Lead Channel Impedance Value: 456 Ohm
Lead Channel Impedance Value: 551 Ohm
Lead Channel Impedance Value: 551 Ohm
Lead Channel Impedance Value: 551 Ohm
Lead Channel Impedance Value: 589 Ohm
Lead Channel Pacing Threshold Amplitude: 0.625 V
Lead Channel Pacing Threshold Amplitude: 1.25 V
Lead Channel Pacing Threshold Pulse Width: 0.4 ms
Lead Channel Pacing Threshold Pulse Width: 0.4 ms
Lead Channel Sensing Intrinsic Amplitude: 1.5 mV
Lead Channel Sensing Intrinsic Amplitude: 1.5 mV
Lead Channel Sensing Intrinsic Amplitude: 11 mV
Lead Channel Setting Pacing Amplitude: 2 V
Lead Channel Setting Pacing Pulse Width: 0.4 ms
Lead Channel Setting Pacing Pulse Width: 0.4 ms
Lead Channel Setting Sensing Sensitivity: 0.3 mV
MDC IDC LEAD IMPLANT DT: 20150417
MDC IDC LEAD LOCATION: 753858
MDC IDC LEAD LOCATION: 753859
MDC IDC MSMT BATTERY VOLTAGE: 2.94 V
MDC IDC MSMT LEADCHNL LV IMPEDANCE VALUE: 361 Ohm
MDC IDC MSMT LEADCHNL LV IMPEDANCE VALUE: 361 Ohm
MDC IDC MSMT LEADCHNL LV IMPEDANCE VALUE: 589 Ohm
MDC IDC MSMT LEADCHNL LV PACING THRESHOLD PULSEWIDTH: 0.4 ms
MDC IDC MSMT LEADCHNL RV IMPEDANCE VALUE: 304 Ohm
MDC IDC MSMT LEADCHNL RV PACING THRESHOLD AMPLITUDE: 0.75 V
MDC IDC MSMT LEADCHNL RV SENSING INTR AMPL: 11 mV
MDC IDC PG IMPLANT DT: 20150417
MDC IDC SESS DTM: 20200305083523
MDC IDC SET LEADCHNL LV PACING AMPLITUDE: 2.25 V
MDC IDC SET LEADCHNL RV PACING AMPLITUDE: 2.5 V
MDC IDC STAT BRADY AP VP PERCENT: 5.15 %
MDC IDC STAT BRADY RV PERCENT PACED: 94.49 %

## 2018-09-30 ENCOUNTER — Encounter: Payer: Self-pay | Admitting: Cardiology

## 2018-09-30 NOTE — Progress Notes (Signed)
Remote ICD transmission.   

## 2018-10-23 ENCOUNTER — Other Ambulatory Visit (HOSPITAL_COMMUNITY): Payer: Self-pay | Admitting: Pulmonary Disease

## 2018-10-23 ENCOUNTER — Other Ambulatory Visit: Payer: Self-pay | Admitting: Pulmonary Disease

## 2018-10-23 DIAGNOSIS — R070 Pain in throat: Secondary | ICD-10-CM

## 2018-11-03 ENCOUNTER — Other Ambulatory Visit: Payer: Self-pay | Admitting: Cardiovascular Disease

## 2018-11-03 DIAGNOSIS — I5023 Acute on chronic systolic (congestive) heart failure: Secondary | ICD-10-CM

## 2018-11-07 DIAGNOSIS — J209 Acute bronchitis, unspecified: Secondary | ICD-10-CM | POA: Diagnosis not present

## 2018-11-07 DIAGNOSIS — I4891 Unspecified atrial fibrillation: Secondary | ICD-10-CM | POA: Diagnosis not present

## 2018-11-07 DIAGNOSIS — I5022 Chronic systolic (congestive) heart failure: Secondary | ICD-10-CM | POA: Diagnosis not present

## 2018-11-07 DIAGNOSIS — I10 Essential (primary) hypertension: Secondary | ICD-10-CM | POA: Diagnosis not present

## 2018-11-11 ENCOUNTER — Ambulatory Visit (HOSPITAL_COMMUNITY): Payer: Medicare Other

## 2018-12-10 ENCOUNTER — Ambulatory Visit (INDEPENDENT_AMBULATORY_CARE_PROVIDER_SITE_OTHER): Payer: Medicare Other | Admitting: Gastroenterology

## 2018-12-10 ENCOUNTER — Telehealth: Payer: Self-pay

## 2018-12-10 ENCOUNTER — Other Ambulatory Visit: Payer: Self-pay

## 2018-12-10 ENCOUNTER — Encounter: Payer: Self-pay | Admitting: Gastroenterology

## 2018-12-10 ENCOUNTER — Telehealth: Payer: Self-pay | Admitting: *Deleted

## 2018-12-10 DIAGNOSIS — R1310 Dysphagia, unspecified: Secondary | ICD-10-CM

## 2018-12-10 DIAGNOSIS — R1319 Other dysphagia: Secondary | ICD-10-CM

## 2018-12-10 DIAGNOSIS — K219 Gastro-esophageal reflux disease without esophagitis: Secondary | ICD-10-CM | POA: Diagnosis not present

## 2018-12-10 NOTE — Telephone Encounter (Signed)
Called pt, EGD/DIL w/RMR scheduled for 12/18/18 at 9:15am. Pt informed to hold Xarelto 24 hours prior to procedure. Instructions given verbally and mailed. Verbalized understanding. Orders entered.

## 2018-12-10 NOTE — Telephone Encounter (Signed)
Called pt to inform her about COVID 19 screening before her procedure.  Pt is scheduled for 12/13/2018.  Pt is aware to remain in quarantine once testing has been completed.  Pt voiced understanding.

## 2018-12-10 NOTE — H&P (View-Only) (Signed)
Primary Care Physician:  Sinda Du, MD Primary GI:  Garfield Cornea, MD   Patient Location: Home  Provider Location: [facility] office  Reason for Phone Visit: dysphagia  Persons present on the phone encounter, with roles: Patient, myself (provider),Mindy Quincy Simmonds, CMA(updated meds and allergies)  Total time (minutes) spent on medical discussion: 15 minutes  Due to COVID-19, visit was conducted using telephonic method (no video was available).  Visit was requested by patient.  Virtual Visit via Telephone only  I connected with Ms. Templeton on 12/10/18 at 11:30 AM EDT by telephone and verified that I am speaking with the correct person using two identifiers.   I discussed the limitations, risks, security and privacy concerns of performing an evaluation and management service by telephone and the availability of in person appointments. I also discussed with the patient that there may be a patient responsible charge related to this service. The patient expressed understanding and agreed to proceed.  Chief Complaint  Patient presents with  . Dysphagia    started 2 months ago, with everything at this point per pt. PCP changed to pantoprazole  4/23.     HPI:   Patient is a pleasant 78 y/o female who presents for telephone visit regarding dysphagia.  Patient last seen in March 2019.  History of right-sided abdominal pain and constipation at that time.  Also with history of GERD.  In the past she has utilized over-the-counter omeprazole and then Prevacid did not seem to help much.  Was switched to omeprazole 40 mg daily at last office visit.  She has tried Best boy for constipation.  Tried on Linzess at last office visit.  LFTs are normal.  Last colonoscopy 2009, Dr. Sharlett Iles, normal.  CT abdomen pelvis with contrast June 2019 ordered by PCP, previously seen soft tissue mass in the right adnexal region no longer visualized.  PCP ordered pelvic ultrasound January 2020 showed a negative  right ovary, no right adnexal mass.  Nonvisualized left ovary.  Status post hysterectomy.  Patient states for the past 2 months she has had increased issues with feeling like she is choking when she eats.  When it started she also had a lot of chest congestion and Dr. Luan Pulling gave her antibiotics.  Chest congestion improved dramatically but swallowing did not improve.  Progressively feels increased difficulty getting the food to go down.  At times feels like pills not going down. Everything hard to go down now.  Food or phlegm coming back up frequently. Has to eat very small amounts at a time. Then starts choking. Then phlegm/acid comes back up.  Some heartburn.  Does not feel like liquid or food is going down the wrong way but more that the food and liquid form for down esophagus.  Denies any increased shortness of breath.  Seems to be at her baseline with her heart disease. Was switched from omeprazole to pantoprazole one month ago. At first maybe helped some but not now. Increased to twice a day and no significant improvement. No abdominal pain. BM regular. No melena, brbpr.   Remote esophageal dilation in the 1980s and seemed to help.   Takes Xarelto at dinnertime.    Current Outpatient Medications  Medication Sig Dispense Refill  . benazepril (LOTENSIN) 40 MG tablet TAKE 1 TABLET BY MOUTH IN THE MORNING 90 tablet 3  . carvedilol (COREG) 25 MG tablet Take 1 tablet (25 mg total) by mouth 2 (two) times daily with a meal. 180 tablet 3  .  cholecalciferol (VITAMIN D) 1000 UNITS tablet Take 1,000 Units by mouth daily.    . fexofenadine (ALLEGRA) 180 MG tablet Take 180 mg by mouth every morning.     . fluticasone (FLONASE) 50 MCG/ACT nasal spray Place 2 sprays into the nose daily as needed for allergies.     . furosemide (LASIX) 40 MG tablet TAKE 1 TABLET BY MOUTH TWICE DAILY AS DIRECTED 180 tablet 0  . hydrALAZINE (APRESOLINE) 25 MG tablet Take 1 tablet (25 mg total) by mouth 3 (three) times daily.  270 tablet 3  . levothyroxine (SYNTHROID, LEVOTHROID) 112 MCG tablet Take 112 mcg by mouth daily before breakfast.    . ondansetron (ZOFRAN) 4 MG tablet Take 1 tablet by mouth as needed.  12  . pantoprazole (PROTONIX) 40 MG tablet Take 40 mg by mouth 2 (two) times daily.    . traMADol (ULTRAM) 50 MG tablet Take 50 mg by mouth every 6 (six) hours as needed for moderate pain.     Marland Kitchen XARELTO 20 MG TABS tablet TAKE 1 TABLET BY MOUTH ONCE DAILY WITH SUPPER 90 tablet 0   No current facility-administered medications for this visit.     Past Medical History:  Diagnosis Date  . A-fib ([place])    on Xarelto  . 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 Cerebrovascular accident [facility])    No clinical event; asymptomatic CT finding  . DDD (degenerative disc disease), cervical    Cervical  . Degenerative joint disease    Right hip and knee  . GERD (gastroesophageal reflux disease)   . Hip dislocation, right ([place])   . Hyperlipemia   . Hypertension   . Hypothyroid   . LBBB (left bundle branch block)   . S/P colonoscopy 2009   Dr. Sharlett Iles: reportedly normal per pt  . S/P endoscopy 1980s   per pt; normal. performed secondary to reflux    Past Surgical History:  Procedure Laterality Date  . ABDOMINAL HYSTERECTOMY    . APPENDECTOMY    . BI-VENTRICULAR IMPLANTABLE CARDIOVERTER DEFIBRILLATOR  (CRT-D)  10-31-2013   MDT Hillery Aldo CRTD implanted by Dr Lovena Le  . BREAST BIOPSY     bilaterally; benign  . can not have MRI due to jaw surgery    . CHOLECYSTECTOMY    . COLONOSCOPY  2009   Dr. Sharlett Iles: Normal  . ESOPHAGOGASTRODUODENOSCOPY  07/13/2011   small hh, patchy gastric erythema with scarring deformity of antrum, chronic gastritis, bx benign with no H.Pylori. Procedure: ESOPHAGOGASTRODUODENOSCOPY (EGD);  Surgeon: Daneil Dolin, MD;  Location[facility];  Service: Endoscopy;  Laterality:  N/A;  10:45  . IMPLANTABLE CARDIOVERTER DEFIBRILLATOR IMPLANT N/A 10/31/2013   Procedure: IMPLANTABLE CARDIOVERTER DEFIBRILLATOR IMPLANT;  Surgeon: Evans Lance, MD;  Location: [facility] CATH LAB;  Service: Cardiovascular;  Laterality: N/A;  . MANDIBLE SURGERY     secondary to malocclusion  . THYROIDECTOMY     neoplasm  . Ureteral Surgery  1970    Family History  Problem Relation Age of Onset  . Brain cancer Mother   . Atrial fibrillation Other   . Colon cancer Neg Hx     Social History   Socioeconomic History  . Marital status: Widowed    Spouse name: Not on file  . Number of children: 2  . Years of education: Not on file  . Highest education level: Not on file  Occupational History  .  Not on file  Social Needs  . Financial resource strain: Not on file  . Food insecurity:    Worry: Not on file    Inability: Not on file  . Transportation needs:    Medical: Not on file    Non-medical: Not on file  Tobacco Use  . Smoking status: Never Smoker  . Smokeless tobacco: Never Used  Substance and Sexual Activity  . Alcohol use: No    Alcohol/week: 0.0 standard drinks  . Drug use: No  . Sexual activity: Never    Partners: Male  Lifestyle  . Physical activity:    Days per week: Not on file    Minutes per session: Not on file  . Stress: Not on file  Relationships  . Social connections:    Talks on phone: Not on file    Gets together: Not on file    Attends religious service: Not on file    Active member of club or organization: Not on file    Attends meetings of clubs or organizations: Not on file    Relationship status: Not on file  . Intimate partner violence:    Fear of current or ex partner: Not on file    Emotionally abused: Not on file    Physically abused: Not on file    Forced sexual activity: Not on file  Other Topics Concern  . Not on file  Social History Narrative   Widowed            ROS:  General: Negative for anorexia, weight loss, fever, chills,  fatigue, weakness. Eyes: Negative for vision changes.  ENT: Negative for hoarseness, difficulty swallowing , nasal congestion. CV: Negative for chest pain, angina, palpitations, dyspnea on exertion, peripheral edema.  Respiratory: Negative for dyspnea at rest, dyspnea on exertion, cough, sputum, wheezing.  GI: See history of present illness. GU:  Negative for dysuria, hematuria, urinary incontinence, urinary frequency, nocturnal urination.  MS: Negative for joint pain, low back pain.  Derm: Negative for rash or itching.  Neuro: Negative for weakness, abnormal sensation, seizure, frequent headaches, memory loss, confusion.  Psych: Negative for anxiety, depression, suicidal ideation, hallucinations.  Endo: Negative for unusual weight change.  Heme: Negative for bruising or bleeding. Allergy: Negative for rash or hives.   Observations/Objective: Pleasant cooperative female, no acute distress.  Occasional cough on the phone.  Able to complete sentences without shortness of breath.     Assessment and Plan: Pleasant 78 year old female with history of chronic GERD presenting with progressive dysphagia for the last couple of months.  Having difficulty getting food and liquid to stay down.  Some heartburn.  No significant improvement with changing PPI to pantoprazole or with increased dosing to twice daily.  We discussed possibility of EGD with dilation for possible esophageal stricture.  She is interested in pursuing this as soon as possible because of increasing difficulty eating.  We did offer her barium pill esophagram as an alternative but she would like to proceed endoscopic evaluation.  I have discussed the risks, alternatives, benefits with regards to but not limited to the risk of reaction to medication, bleeding, infection, perforation and the patient is agreeable to proceed. Written consent to be obtained.   We will need to hold her Xarelto for 24 to 48 hours prior to procedure.  She has a  cardiac defibrillator.   In the meantime she will consume soft foods, use plenty liquids when taking pills and eating.  Call with any concerns in the  interim.  Follow Up Instructions:    I discussed the assessment and treatment plan with the patient. The patient was provided an opportunity to ask questions and all were answered. The patient agreed with the plan and demonstrated an understanding of the instructions. AVS mailed to patient's home address.   The patient was advised to call back or seek an in-person evaluation if the symptoms worsen or if the condition fails to improve as anticipated.  I provided 15 minutes of non-face-to-face time during this encounter.   Neil Crouch, PA-C

## 2018-12-10 NOTE — Progress Notes (Signed)
Primary Care Physician:  Sinda Du, MD Primary GI:  Garfield Cornea, MD   Patient Location: Home  Provider Location: [facility] [place] office  Reason for Phone Visit: dysphagia  Persons present on the phone encounter, with roles: Patient, myself (provider),Mindy Quincy Simmonds, CMA(updated meds and allergies)  Total time (minutes) spent on medical discussion: 15 minutes  Due to COVID-19, visit was conducted using telephonic method (no video was available).  Visit was requested by patient.  Virtual Visit via Telephone only  I connected with Ms. Trevor on 12/10/18 at 11:30 AM EDT by telephone and verified that I am speaking with the correct person using two identifiers.   I discussed the limitations, risks, security and privacy concerns of performing an evaluation and management service by telephone and the availability of in person appointments. I also discussed with the patient that there may be a patient responsible charge related to this service. The patient expressed understanding and agreed to proceed.  Chief Complaint  Patient presents with  . Dysphagia    started 2 months ago, with everything at this point per pt. PCP changed to pantoprazole  4/23.     HPI:   Patient is a pleasant 78 y/o female who presents for telephone visit regarding dysphagia.  Patient last seen in March 2019.  History of right-sided abdominal pain and constipation at that time.  Also with history of GERD.  In the past she has utilized over-the-counter omeprazole and then Prevacid did not seem to help much.  Was switched to omeprazole 40 mg daily at last office visit.  She has tried Best boy for constipation.  Tried on Linzess at last office visit.  LFTs are normal.  Last colonoscopy 2009, Dr. Sharlett Iles, normal.  CT abdomen pelvis with contrast June 2019 ordered by PCP, previously seen soft tissue mass in the right adnexal region no longer visualized.  PCP ordered pelvic ultrasound January 2020 showed a negative  right ovary, no right adnexal mass.  Nonvisualized left ovary.  Status post hysterectomy.  Patient states for the past 2 months she has had increased issues with feeling like she is choking when she eats.  When it started she also had a lot of chest congestion and Dr. Luan Pulling gave her antibiotics.  Chest congestion improved dramatically but swallowing did not improve.  Progressively feels increased difficulty getting the food to go down.  At times feels like pills not going down. Everything hard to go down now.  Food or phlegm coming back up frequently. Has to eat very small amounts at a time. Then starts choking. Then phlegm/acid comes back up.  Some heartburn.  Does not feel like liquid or food is going down the wrong way but more that the food and liquid form for down esophagus.  Denies any increased shortness of breath.  Seems to be at her baseline with her heart disease. Was switched from omeprazole to pantoprazole one month ago. At first maybe helped some but not now. Increased to twice a day and no significant improvement. No abdominal pain. BM regular. No melena, brbpr.   Remote esophageal dilation in the 1980s and seemed to help.   Takes Xarelto at dinnertime.    Current Outpatient Medications  Medication Sig Dispense Refill  . benazepril (LOTENSIN) 40 MG tablet TAKE 1 TABLET BY MOUTH IN THE MORNING 90 tablet 3  . carvedilol (COREG) 25 MG tablet Take 1 tablet (25 mg total) by mouth 2 (two) times daily with a meal. 180 tablet 3  .  cholecalciferol (VITAMIN D) 1000 UNITS tablet Take 1,000 Units by mouth daily.    . fexofenadine (ALLEGRA) 180 MG tablet Take 180 mg by mouth every morning.     . fluticasone (FLONASE) 50 MCG/ACT nasal spray Place 2 sprays into the nose daily as needed for allergies.     . furosemide (LASIX) 40 MG tablet TAKE 1 TABLET BY MOUTH TWICE DAILY AS DIRECTED 180 tablet 0  . hydrALAZINE (APRESOLINE) 25 MG tablet Take 1 tablet (25 mg total) by mouth 3 (three) times daily.  270 tablet 3  . levothyroxine (SYNTHROID, LEVOTHROID) 112 MCG tablet Take 112 mcg by mouth daily before breakfast.    . ondansetron (ZOFRAN) 4 MG tablet Take 1 tablet by mouth as needed.  12  . pantoprazole (PROTONIX) 40 MG tablet Take 40 mg by mouth 2 (two) times daily.    . traMADol (ULTRAM) 50 MG tablet Take 50 mg by mouth every 6 (six) hours as needed for moderate pain.     Marland Kitchen XARELTO 20 MG TABS tablet TAKE 1 TABLET BY MOUTH ONCE DAILY WITH SUPPER 90 tablet 0   No current facility-administered medications for this visit.     Past Medical History:  Diagnosis Date  . A-fib ([place])    on Xarelto  . 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 Cerebrovascular accident [facility])    No clinical event; asymptomatic CT finding  . DDD (degenerative disc disease), cervical    Cervical  . Degenerative joint disease    Right hip and knee  . GERD (gastroesophageal reflux disease)   . Hip dislocation, right ([place])   . Hyperlipemia   . Hypertension   . Hypothyroid   . LBBB (left bundle branch block)   . S/P colonoscopy 2009   Dr. Sharlett Iles: reportedly normal per pt  . S/P endoscopy 1980s   per pt; normal. performed secondary to reflux    Past Surgical History:  Procedure Laterality Date  . ABDOMINAL HYSTERECTOMY    . APPENDECTOMY    . BI-VENTRICULAR IMPLANTABLE CARDIOVERTER DEFIBRILLATOR  (CRT-D)  10-31-2013   MDT Hillery Aldo CRTD implanted by Dr Lovena Le  . BREAST BIOPSY     bilaterally; benign  . can not have MRI due to jaw surgery    . CHOLECYSTECTOMY    . COLONOSCOPY  2009   Dr. Sharlett Iles: Normal  . ESOPHAGOGASTRODUODENOSCOPY  07/13/2011   small hh, patchy gastric erythema with scarring deformity of antrum, chronic gastritis, bx benign with no H.Pylori. Procedure: ESOPHAGOGASTRODUODENOSCOPY (EGD);  Surgeon: Daneil Dolin, MD;  Location: [facility] SUITE;  Service: Endoscopy;  Laterality:  N/A;  10:45  . IMPLANTABLE CARDIOVERTER DEFIBRILLATOR IMPLANT N/A 10/31/2013   Procedure: IMPLANTABLE CARDIOVERTER DEFIBRILLATOR IMPLANT;  Surgeon: Evans Lance, MD;  Location: [facility] CATH LAB;  Service: Cardiovascular;  Laterality: N/A;  . MANDIBLE SURGERY     secondary to malocclusion  . THYROIDECTOMY     neoplasm  . Ureteral Surgery  1970    Family History  Problem Relation Age of Onset  . Brain cancer Mother   . Atrial fibrillation Other   . Colon cancer Neg Hx     Social History   Socioeconomic History  . Marital status: Widowed    Spouse name: Not on file  . Number of children: 2  . Years of education: Not on file  . Highest education level: Not on file  Occupational History  .  Not on file  Social Needs  . Financial resource strain: Not on file  . Food insecurity:    Worry: Not on file    Inability: Not on file  . Transportation needs:    Medical: Not on file    Non-medical: Not on file  Tobacco Use  . Smoking status: Never Smoker  . Smokeless tobacco: Never Used  Substance and Sexual Activity  . Alcohol use: No    Alcohol/week: 0.0 standard drinks  . Drug use: No  . Sexual activity: Never    Partners: Male  Lifestyle  . Physical activity:    Days per week: Not on file    Minutes per session: Not on file  . Stress: Not on file  Relationships  . Social connections:    Talks on phone: Not on file    Gets together: Not on file    Attends religious service: Not on file    Active member of club or organization: Not on file    Attends meetings of clubs or organizations: Not on file    Relationship status: Not on file  . Intimate partner violence:    Fear of current or ex partner: Not on file    Emotionally abused: Not on file    Physically abused: Not on file    Forced sexual activity: Not on file  Other Topics Concern  . Not on file  Social History Narrative   Widowed            ROS:  General: Negative for anorexia, weight loss, fever, chills,  fatigue, weakness. Eyes: Negative for vision changes.  ENT: Negative for hoarseness, difficulty swallowing , nasal congestion. CV: Negative for chest pain, angina, palpitations, dyspnea on exertion, peripheral edema.  Respiratory: Negative for dyspnea at rest, dyspnea on exertion, cough, sputum, wheezing.  GI: See history of present illness. GU:  Negative for dysuria, hematuria, urinary incontinence, urinary frequency, nocturnal urination.  MS: Negative for joint pain, low back pain.  Derm: Negative for rash or itching.  Neuro: Negative for weakness, abnormal sensation, seizure, frequent headaches, memory loss, confusion.  Psych: Negative for anxiety, depression, suicidal ideation, hallucinations.  Endo: Negative for unusual weight change.  Heme: Negative for bruising or bleeding. Allergy: Negative for rash or hives.   Observations/Objective: Pleasant cooperative female, no acute distress.  Occasional cough on the phone.  Able to complete sentences without shortness of breath.     Assessment and Plan: Pleasant 78 year old female with history of chronic GERD presenting with progressive dysphagia for the last couple of months.  Having difficulty getting food and liquid to stay down.  Some heartburn.  No significant improvement with changing PPI to pantoprazole or with increased dosing to twice daily.  We discussed possibility of EGD with dilation for possible esophageal stricture.  She is interested in pursuing this as soon as possible because of increasing difficulty eating.  We did offer her barium pill esophagram as an alternative but she would like to proceed endoscopic evaluation.  I have discussed the risks, alternatives, benefits with regards to but not limited to the risk of reaction to medication, bleeding, infection, perforation and the patient is agreeable to proceed. Written consent to be obtained.   We will need to hold her Xarelto for 24 to 48 hours prior to procedure.  She has a  cardiac defibrillator.   In the meantime she will consume soft foods, use plenty liquids when taking pills and eating.  Call with any concerns in the  interim.  Follow Up Instructions:    I discussed the assessment and treatment plan with the patient. The patient was provided an opportunity to ask questions and all were answered. The patient agreed with the plan and demonstrated an understanding of the instructions. AVS mailed to patient's home address.   The patient was advised to call back or seek an in-person evaluation if the symptoms worsen or if the condition fails to improve as anticipated.  I provided 15 minutes of non-face-to-face time during this encounter.   Neil Crouch, PA-C

## 2018-12-10 NOTE — Patient Instructions (Signed)
1. Continue pantoprazole 40 mg twice daily, 30 minutes before breakfast and 30 minutes before your evening meal. 2. We will contact you to schedule upper endoscopy with esophageal dilation in the near future.  See separate instructions. 3. You will need to stop Xarelto prior to procedure, see separate instructions.  Typically will skip the dose the night before your procedure. 4. In the meantime, consume soft foods only.  Chopped food finely.  Plenty of liquids to wash pills and food down.  Call with any questions or concerns.

## 2018-12-11 NOTE — Progress Notes (Signed)
cc'ed to pcp °

## 2018-12-11 NOTE — Telephone Encounter (Signed)
Endo scheduler called office for pt's time for procedure 12/18/18 to be moved to later in day. Procedure time will be 1:00pm. Called pt, she will arrive at 12:00pm. Advised her to be NPO after 9:00am. Verbalized understanding.

## 2018-12-13 ENCOUNTER — Other Ambulatory Visit: Payer: Self-pay

## 2018-12-13 ENCOUNTER — Other Ambulatory Visit (HOSPITAL_COMMUNITY)
Admission: RE | Admit: 2018-12-13 | Discharge: 2018-12-13 | Disposition: A | Discharge status: Home / Self Care | Payer: Medicare Other | Source: Ambulatory Visit | Attending: Internal Medicine | Admitting: Internal Medicine

## 2018-12-13 DIAGNOSIS — Z1159 Encounter for screening for other viral diseases: Secondary | ICD-10-CM | POA: Insufficient documentation

## 2018-12-14 LAB — NOVEL CORONAVIRUS, NAA (HOSP ORDER, SEND-OUT TO REF LAB; TAT 18-24 HRS): SARS-CoV-2, NAA: NOT DETECTED

## 2018-12-18 ENCOUNTER — Encounter (HOSPITAL_COMMUNITY): Payer: Self-pay | Admitting: *Deleted

## 2018-12-18 ENCOUNTER — Encounter (HOSPITAL_COMMUNITY): Payer: Self-pay

## 2018-12-18 ENCOUNTER — Other Ambulatory Visit: Payer: Self-pay

## 2018-12-18 ENCOUNTER — Ambulatory Visit (HOSPITAL_COMMUNITY)
Admission: RE | Admit: 2018-12-18 | Discharge: 2018-12-18 | Disposition: A | Discharge status: Home / Self Care | Payer: Medicare Other | Attending: Internal Medicine | Admitting: Internal Medicine

## 2018-12-18 ENCOUNTER — Ambulatory Visit (HOSPITAL_COMMUNITY): Payer: Medicare Other

## 2018-12-18 ENCOUNTER — Encounter (HOSPITAL_COMMUNITY)
Admission: RE | Disposition: A | Discharge status: Home / Self Care | Payer: Self-pay | Source: Home / Self Care | Attending: Internal Medicine

## 2018-12-18 DIAGNOSIS — I10 Essential (primary) hypertension: Secondary | ICD-10-CM | POA: Insufficient documentation

## 2018-12-18 DIAGNOSIS — E785 Hyperlipidemia, unspecified: Secondary | ICD-10-CM | POA: Insufficient documentation

## 2018-12-18 DIAGNOSIS — K219 Gastro-esophageal reflux disease without esophagitis: Secondary | ICD-10-CM | POA: Insufficient documentation

## 2018-12-18 DIAGNOSIS — Z95810 Presence of automatic (implantable) cardiac defibrillator: Secondary | ICD-10-CM | POA: Diagnosis not present

## 2018-12-18 DIAGNOSIS — Z79899 Other long term (current) drug therapy: Secondary | ICD-10-CM | POA: Diagnosis not present

## 2018-12-18 DIAGNOSIS — Z7951 Long term (current) use of inhaled steroids: Secondary | ICD-10-CM | POA: Insufficient documentation

## 2018-12-18 DIAGNOSIS — Z8673 Personal history of transient ischemic attack (TIA), and cerebral infarction without residual deficits: Secondary | ICD-10-CM | POA: Diagnosis not present

## 2018-12-18 DIAGNOSIS — K3189 Other diseases of stomach and duodenum: Secondary | ICD-10-CM | POA: Diagnosis not present

## 2018-12-18 DIAGNOSIS — I428 Other cardiomyopathies: Secondary | ICD-10-CM | POA: Insufficient documentation

## 2018-12-18 DIAGNOSIS — Z9049 Acquired absence of other specified parts of digestive tract: Secondary | ICD-10-CM | POA: Diagnosis not present

## 2018-12-18 DIAGNOSIS — E890 Postprocedural hypothyroidism: Secondary | ICD-10-CM | POA: Insufficient documentation

## 2018-12-18 DIAGNOSIS — K5900 Constipation, unspecified: Secondary | ICD-10-CM | POA: Diagnosis not present

## 2018-12-18 DIAGNOSIS — I4891 Unspecified atrial fibrillation: Secondary | ICD-10-CM | POA: Insufficient documentation

## 2018-12-18 DIAGNOSIS — Z7901 Long term (current) use of anticoagulants: Secondary | ICD-10-CM | POA: Insufficient documentation

## 2018-12-18 DIAGNOSIS — M1711 Unilateral primary osteoarthritis, right knee: Secondary | ICD-10-CM | POA: Insufficient documentation

## 2018-12-18 DIAGNOSIS — Z90710 Acquired absence of both cervix and uterus: Secondary | ICD-10-CM | POA: Insufficient documentation

## 2018-12-18 DIAGNOSIS — M1611 Unilateral primary osteoarthritis, right hip: Secondary | ICD-10-CM | POA: Insufficient documentation

## 2018-12-18 DIAGNOSIS — R1319 Other dysphagia: Secondary | ICD-10-CM

## 2018-12-18 DIAGNOSIS — R1310 Dysphagia, unspecified: Secondary | ICD-10-CM | POA: Diagnosis not present

## 2018-12-18 DIAGNOSIS — K449 Diaphragmatic hernia without obstruction or gangrene: Secondary | ICD-10-CM | POA: Insufficient documentation

## 2018-12-18 DIAGNOSIS — Z808 Family history of malignant neoplasm of other organs or systems: Secondary | ICD-10-CM | POA: Insufficient documentation

## 2018-12-18 DIAGNOSIS — I447 Left bundle-branch block, unspecified: Secondary | ICD-10-CM | POA: Diagnosis not present

## 2018-12-18 DIAGNOSIS — Z8249 Family history of ischemic heart disease and other diseases of the circulatory system: Secondary | ICD-10-CM | POA: Insufficient documentation

## 2018-12-18 HISTORY — PX: BIOPSY: SHX5522

## 2018-12-18 HISTORY — PX: ESOPHAGOGASTRODUODENOSCOPY: SHX5428

## 2018-12-18 HISTORY — PX: MALONEY DILATION: SHX5535

## 2018-12-18 SURGERY — EGD (ESOPHAGOGASTRODUODENOSCOPY)
Anesthesia: Moderate Sedation

## 2018-12-18 MED ORDER — STERILE WATER FOR IRRIGATION IR SOLN
Status: DC | PRN
  Administered 2018-12-18: 2.5 mL

## 2018-12-18 MED ORDER — LIDOCAINE VISCOUS HCL 2 % MT SOLN
OROMUCOSAL | Status: AC
  Filled 2018-12-18: qty 15

## 2018-12-18 MED ORDER — SODIUM CHLORIDE 0.9 % IV SOLN
INTRAVENOUS | Status: DC
  Administered 2018-12-18: 12:00:00 via INTRAVENOUS

## 2018-12-18 MED ORDER — ONDANSETRON HCL 4 MG/2ML IJ SOLN
INTRAMUSCULAR | Status: AC
  Filled 2018-12-18: qty 2

## 2018-12-18 MED ORDER — MIDAZOLAM HCL 5 MG/5ML IJ SOLN
INTRAMUSCULAR | Status: DC | PRN
  Administered 2018-12-18 (×2): 1 mg via INTRAVENOUS

## 2018-12-18 MED ORDER — LIDOCAINE VISCOUS HCL 2 % MT SOLN
OROMUCOSAL | Status: DC | PRN
  Administered 2018-12-18: 2.5 mL via OROMUCOSAL

## 2018-12-18 MED ORDER — MEPERIDINE HCL 100 MG/ML IJ SOLN
INTRAMUSCULAR | Status: DC | PRN
  Administered 2018-12-18: 25 mg via INTRAVENOUS

## 2018-12-18 MED ORDER — MEPERIDINE HCL 50 MG/ML IJ SOLN
INTRAMUSCULAR | Status: AC
  Filled 2018-12-18: qty 1

## 2018-12-18 MED ORDER — MIDAZOLAM HCL 5 MG/5ML IJ SOLN
INTRAMUSCULAR | Status: AC
  Filled 2018-12-18: qty 10

## 2018-12-18 MED ORDER — ONDANSETRON HCL 4 MG/2ML IJ SOLN
INTRAMUSCULAR | Status: DC | PRN
  Administered 2018-12-18: 4 mg via INTRAVENOUS

## 2018-12-18 NOTE — Interval H&P Note (Signed)
History and Physical Interval Note:  12/18/2018 12:56 PM  [name]  has presented today for surgery, with the diagnosis of dysphagia.  The various methods of treatment have been discussed with the patient and family. After consideration of risks, benefits and other options for treatment, the patient has consented to  Procedure(s) with comments: ESOPHAGOGASTRODUODENOSCOPY (EGD) (N/A) - 9:15am MALONEY DILATION (N/A) as a surgical intervention.  The patient's history has been reviewed, patient examined, no change in status, stable for surgery.  I have reviewed the patient's chart and labs.  Questions were answered to the patient's satisfaction.     Manus Rudd

## 2018-12-18 NOTE — Op Note (Signed)
[facility] Patient Name: [name] Procedure Date: 12/18/2018 12:44 PM MRN: [id number] Date of Birth: 05-28-1941 Attending MD: Norvel Richards , MD CSN: [id number] Age: 78 Admit Type: Outpatient Procedure:                Upper GI endoscopy Indications:              Dysphagia Providers:                Norvel Richards, MD, Gwynneth Albright RN,                            RN, Raphael Gibney, Technician Referring MD:              Medicines:                Midazolam 2 mg IV, Meperidine 25 mg IV, Ondansetron                            4 mg IV Complications:            No immediate complications. Estimated Blood Loss:     Estimated blood loss was minimal. Procedure:                Pre-Anesthesia Assessment:                           - Prior to the procedure, a History and Physical                            was performed, and patient medications and                            allergies were reviewed. The patient's tolerance of                            previous anesthesia was also reviewed. The risks                            and benefits of the procedure and the sedation                            options and risks were discussed with the patient.                            All questions were answered, and informed consent                            was obtained. Prior Anticoagulants: The patient                            last took Xarelto (rivaroxaban) 2 days prior to the                            procedure. ASA Grade Assessment: III - A patient  with severe systemic disease. After reviewing the                            risks and benefits, the patient was deemed in                            satisfactory condition to undergo the procedure.                           After obtaining informed consent, the endoscope was                            passed under direct vision. Throughout the                            procedure, the patient's  blood pressure, pulse, and                            oxygen saturations were monitored continuously. The                            GIF-H190 ([id number]) scope was introduced through the                            mouth, and advanced to the second part of duodenum.                            The upper GI endoscopy was accomplished without                            difficulty. The patient tolerated the procedure                            well. Scope In: 1:11:52 PM Scope Out: 1:19:46 PM Total Procedure Duration: 0 hours 7 minutes 54 seconds  Findings:      The examined esophagus was normal.      A small hiatal hernia was present. Moderate amount of bile-stained mucus       lining the gastric mucosa.      Diffuse erythematous mucosa was found in the entire examined stomach. No       ulcer or infiltrating process seen.      The duodenal bulb and second portion of the duodenum were normal. The       scope was withdrawn. Dilation was performed with a Maloney dilator with       mild resistance at 12 Fr. The dilation site was examined following       endoscope reinsertion and showed no change. Estimated blood loss: none.       Finally, the abnormal appearing gastric mucosa was biopsied with a cold       forceps for histology. Estimated blood loss was minimal. Impression:               - Normal esophagus. Dilated.                           -  Small hiatal hernia.                           - Erythematous mucosa in the stomach. Status post                            biopsy                           - Normal duodenal bulb and second portion of the                            duodenum. Moderate Sedation:      Moderate (conscious) sedation was administered by the endoscopy nurse       and supervised by the endoscopist. The following parameters were       monitored: oxygen saturation, heart rate, blood pressure, respiratory       rate, EKG, adequacy of pulmonary ventilation, and response to care.        Total physician intraservice time was 16 minutes. Recommendation:           - Patient has a contact number available for                            emergencies. The signs and symptoms of potential                            delayed complications were discussed with the                            patient. Return to normal activities tomorrow.                            Written discharge instructions were provided to the                            patient.                           - Resume previous diet.                           - Continue present medications. Xarelto today.                           - Await pathology results.                           - No repeat upper endoscopy for surveillance of                            Barrett's esophagus due to age.                           - Return to GI office in 2 months. No structural  lesion found in esophagus today. If dysphagia does                            not improved after dilation, need to consider the                            possibility of a motility disorder. Procedure Code(s):        --- Professional ---                           [phone], Esophagogastroduodenoscopy, flexible,                            transoral; with biopsy, single or multiple                           43450, Dilation of esophagus, by unguided sound or                            bougie, single or multiple passes                           G0500, Moderate sedation services provided by the                            same physician or other qualified health care                            professional performing a gastrointestinal                            endoscopic service that sedation supports,                            requiring the presence of an independent trained                            observer to assist in the monitoring of the                            patient's level of consciousness and physiological                             status; initial 15 minutes of intra-service time;                            patient age 62 years or older (additional time may                            be reported with [phone], as appropriate) Diagnosis Code(s):        --- Professional ---                           K44.9, Diaphragmatic hernia without obstruction or  gangrene                           K31.89, Other diseases of stomach and duodenum                           R13.10, Dysphagia, unspecified CPT copyright 2019 American Medical Association. All rights reserved. The codes documented in this report are preliminary and upon coder review may  be revised to meet current compliance requirements. Cristopher Estimable. Jonavan Vanhorn, MD Norvel Richards, MD 12/18/2018 1:33:51 PM This report has been signed electronically. Number of Addenda: 0

## 2018-12-18 NOTE — Interval H&P Note (Signed)
History and Physical Interval Note:  12/18/2018 12:52 PM  [name]  has presented today for surgery, with the diagnosis of dysphagia.  The various methods of treatment have been discussed with the patient and family. After consideration of risks, benefits and other options for treatment, the patient has consented to  Procedure(s) with comments: ESOPHAGOGASTRODUODENOSCOPY (EGD) (N/A) - 9:15am MALONEY DILATION (N/A) as a surgical intervention.  The patient's history has been reviewed, patient examined, no change in status, stable for surgery.  I have reviewed the patient's chart and labs.  Questions were answered to the patient's satisfaction.     Robert Rourk  No change.  Xarelto held 2 days ago EGD with esophageal dilation as feasible/appropriate per plan.  The risks, benefits, limitations, alternatives and imponderables have been reviewed with the patient. Potential for esophageal dilation, biopsy, etc. have also been reviewed.  Questions have been answered. All parties agreeable.

## 2018-12-18 NOTE — Discharge Instructions (Addendum)
EGD Discharge instructions Please read the instructions outlined below and refer to this sheet in the next few weeks. These discharge instructions provide you with general information on caring for yourself after you leave the hospital. Your doctor may also give you specific instructions. While your treatment has been planned according to the most current medical practices available, unavoidable complications occasionally occur. If you have any problems or questions after discharge, please call your doctor. ACTIVITY  You may resume your regular activity but move at a slower pace for the next 24 hours.   Take frequent rest periods for the next 24 hours.   Walking will help expel (get rid of) the air and reduce the bloated feeling in your abdomen.   No driving for 24 hours (because of the anesthesia (medicine) used during the test).   You may shower.   Do not sign any important legal documents or operate any machinery for 24 hours (because of the anesthesia used during the test).  NUTRITION  Drink plenty of fluids.   You may resume your normal diet.   Begin with a light meal and progress to your normal diet.   Avoid alcoholic beverages for 24 hours or as instructed by your caregiver.  MEDICATIONS  You may resume your normal medications unless your caregiver tells you otherwise.  WHAT YOU CAN EXPECT TODAY  You may experience abdominal discomfort such as a feeling of fullness or gas pains.  FOLLOW-UP  Your doctor will discuss the results of your test with you.  SEEK IMMEDIATE MEDICAL ATTENTION IF ANY OF THE FOLLOWING OCCUR:  Excessive nausea (feeling sick to your stomach) and/or vomiting.   Severe abdominal pain and distention (swelling).   Trouble swallowing.   Temperature over 101 F (37.8 C).   Rectal bleeding or vomiting of blood.     Resume Xarelto today  Continue Protonix; your esophagus was stretched today.  Hopefully your swallowing will improve.   If your  symptoms persist, further testing may be needed.  Office visit with [place] in 2 months  Further recommendations to follow pending review of pathology report  At patient's request, I discussed impression and recommendations with son, Trenda Corliss, at [phone]

## 2018-12-19 ENCOUNTER — Ambulatory Visit (INDEPENDENT_AMBULATORY_CARE_PROVIDER_SITE_OTHER): Payer: Medicare Other | Admitting: *Deleted

## 2018-12-19 DIAGNOSIS — I5022 Chronic systolic (congestive) heart failure: Secondary | ICD-10-CM

## 2018-12-19 DIAGNOSIS — I420 Dilated cardiomyopathy: Secondary | ICD-10-CM

## 2018-12-20 LAB — CUP PACEART REMOTE DEVICE CHECK
Battery Remaining Longevity: 23 mo
Battery Voltage: 2.91 V
Brady Statistic AP VP Percent: 12.82 %
Brady Statistic AP VS Percent: 0.03 %
Brady Statistic AS VP Percent: 85.44 %
Brady Statistic AS VS Percent: 1.71 %
Brady Statistic RA Percent Paced: 12.22 %
Brady Statistic RV Percent Paced: 93.34 %
Date Time Interrogation Session: 20200604112504
HighPow Impedance: 69 Ohm
Implantable Lead Implant Date: 20150417
Implantable Lead Implant Date: 20150417
Implantable Lead Implant Date: 20150417
Implantable Lead Location: 753858
Implantable Lead Location: 753859
Implantable Lead Location: 753860
Implantable Lead Model: 4298
Implantable Lead Model: 5076
Implantable Lead Model: 6935
Implantable Pulse Generator Implant Date: 20150417
Lead Channel Impedance Value: 247 Ohm
Lead Channel Impedance Value: 285 Ohm
Lead Channel Impedance Value: 285 Ohm
Lead Channel Impedance Value: 285 Ohm
Lead Channel Impedance Value: 304 Ohm
Lead Channel Impedance Value: 342 Ohm
Lead Channel Impedance Value: 342 Ohm
Lead Channel Impedance Value: 418 Ohm
Lead Channel Impedance Value: 418 Ohm
Lead Channel Impedance Value: 456 Ohm
Lead Channel Impedance Value: 475 Ohm
Lead Channel Impedance Value: 513 Ohm
Lead Channel Impedance Value: 513 Ohm
Lead Channel Pacing Threshold Amplitude: 0.5 V
Lead Channel Pacing Threshold Amplitude: 0.75 V
Lead Channel Pacing Threshold Amplitude: 2.75 V
Lead Channel Pacing Threshold Pulse Width: 0.4 ms
Lead Channel Pacing Threshold Pulse Width: 0.4 ms
Lead Channel Pacing Threshold Pulse Width: 0.4 ms
Lead Channel Sensing Intrinsic Amplitude: 1 mV
Lead Channel Sensing Intrinsic Amplitude: 1 mV
Lead Channel Sensing Intrinsic Amplitude: 10.625 mV
Lead Channel Sensing Intrinsic Amplitude: 10.625 mV
Lead Channel Setting Pacing Amplitude: 2 V
Lead Channel Setting Pacing Amplitude: 2.5 V
Lead Channel Setting Pacing Amplitude: 4.25 V
Lead Channel Setting Pacing Pulse Width: 0.4 ms
Lead Channel Setting Pacing Pulse Width: 0.4 ms
Lead Channel Setting Sensing Sensitivity: 0.3 mV

## 2018-12-21 ENCOUNTER — Encounter: Payer: Self-pay | Admitting: Internal Medicine

## 2018-12-24 ENCOUNTER — Encounter (HOSPITAL_COMMUNITY): Payer: Self-pay | Admitting: Internal Medicine

## 2018-12-27 NOTE — Progress Notes (Signed)
Remote ICD transmission.   

## 2018-12-31 ENCOUNTER — Telehealth: Payer: Self-pay | Admitting: Internal Medicine

## 2018-12-31 NOTE — Telephone Encounter (Signed)
Virtual Visit Pre-Appointment Phone Call  "(Name), I am calling you today to discuss your upcoming appointment. We are currently trying to limit exposure to the virus that causes COVID-19 by seeing patients at home rather than in the office."  1. "What is the BEST phone number to call the day of the visit?" - include this in appointment notes  2. Do you have or have access to (through a family member/friend) a smartphone with video capability that we can use for your visit?" a. If yes - list this number in appt notes as cell (if different from BEST phone #) and list the appointment type as a VIDEO visit in appointment notes b. If no - list the appointment type as a PHONE visit in appointment notes  3. Confirm consent - "In the setting of the current Covid19 crisis, you are scheduled for a (phone or video) visit with your provider on (date) at (time).  Just as we do with many in-office visits, in order for you to participate in this visit, we must obtain consent.  If you'd like, I can send this to your mychart (if signed up) or email for you to review.  Otherwise, I can obtain your verbal consent now.  All virtual visits are billed to your insurance company just like a normal visit would be.  By agreeing to a virtual visit, we'd like you to understand that the technology does not allow for your provider to perform an examination, and thus may limit your provider's ability to fully assess your condition. If your provider identifies any concerns that need to be evaluated in person, we will make arrangements to do so.  Finally, though the technology is pretty good, we cannot assure that it will always work on either your or our end, and in the setting of a video visit, we may have to convert it to a phone-only visit.  In either situation, we cannot ensure that we have a secure connection.  Are you willing to proceed?" STAFF: Did the patient verbally acknowledge consent to telehealth visit? Document  YES/NO here: Yes  4. Advise patient to be prepared - "Two hours prior to your appointment, go ahead and check your blood pressure, pulse, oxygen saturation, and your weight (if you have the equipment to check those) and write them all down. When your visit starts, your provider will ask you for this information. If you have an Apple Watch or Kardia device, please plan to have heart rate information ready on the day of your appointment. Please have a pen and paper handy nearby the day of the visit as well."  5. Give patient instructions for MyChart download to smartphone OR [email or web address] as below if video visit (depending on what platform provider is using)  6. Inform patient they will receive a phone call 15 minutes prior to their appointment time (may be from unknown caller ID) so they should be prepared to answer    TELEPHONE CALL NOTE  [name] has been deemed a candidate for a follow-up tele-health visit to limit community exposure during the Covid-19 pandemic. I spoke with the patient via phone to ensure availability of phone/video source, confirm preferred email & phone number, and discuss instructions and expectations.  I reminded [name] to be prepared with any vital sign and/or heart rhythm information that could potentially be obtained via home monitoring, at the time of her visit. I reminded [name] to expect a phone call prior to  her visit.  [name] 12/31/2018 9:41 AM

## 2019-01-01 ENCOUNTER — Telehealth (INDEPENDENT_AMBULATORY_CARE_PROVIDER_SITE_OTHER): Payer: Medicare Other | Admitting: Internal Medicine

## 2019-01-01 ENCOUNTER — Other Ambulatory Visit: Payer: Self-pay

## 2019-01-01 ENCOUNTER — Encounter: Payer: Self-pay | Admitting: Internal Medicine

## 2019-01-01 VITALS — BP 135/68 | HR 74 | Ht 67.0 in

## 2019-01-01 DIAGNOSIS — Z95810 Presence of automatic (implantable) cardiac defibrillator: Secondary | ICD-10-CM

## 2019-01-01 DIAGNOSIS — I5022 Chronic systolic (congestive) heart failure: Secondary | ICD-10-CM

## 2019-01-01 DIAGNOSIS — I10 Essential (primary) hypertension: Secondary | ICD-10-CM | POA: Diagnosis not present

## 2019-01-01 NOTE — Patient Instructions (Signed)
Medication Instructions:  Your physician recommends that you continue on your current medications as directed. Please refer to the Current Medication list given to you today.  If you need a refill on your cardiac medications before your next appointment, please call your pharmacy.   Lab work: NONE   If you have labs (blood work) drawn today and your tests are completely normal, you will receive your results only by: Marland Kitchen MyChart Message (if you have MyChart) OR . A paper copy in the mail If you have any lab test that is abnormal or we need to change your treatment, we will call you to review the results.  Testing/Procedures: NONE   Follow-Up: At [facility], you and your health needs are our priority.  As part of our continuing mission to provide you with exceptional heart care, we have created designated Provider Care Teams.  These Care Teams include your primary Cardiologist (physician) and Advanced Practice Providers (APPs -  Physician Assistants and Nurse Practitioners) who all work together to provide you with the care you need, when you need it. You will need a follow up appointment in 6 months.  Please call our office 2 months in advance to schedule this appointment.  You may see Cristopher Peru, MD or one of the following Advanced Practice Providers on your designated Care Team:   Chanetta Marshall, NP . Tommye Standard, PA-C  Remote monitoring is used to monitor your Pacemaker of ICD from home. This monitoring reduces the number of office visits required to check your device to one time per year. It allows [place] to keep an eye on the functioning of your device to ensure it is working properly. You are scheduled for a device check from home on 04/06/19. You may send your transmission at any time that day. If you have a wireless device, the transmission will be sent automatically. After your physician reviews your transmission, you will receive a postcard with your next transmission date.   Any Other  Special Instructions Will Be Listed Below (If Applicable). Thank you for choosing [facility]!

## 2019-01-01 NOTE — Progress Notes (Signed)
Electrophysiology TeleHealth Note   Due to national recommendations of social distancing due to COVID 19, an audio/video telehealth visit is felt to be most appropriate for this patient at this time.  See MyChart message from today for the patient's consent to telehealth for [facility].   Date:  01/01/2019   ID:  [name], DOB January 21, 1941, MRN [id number]  Location: patient's home  Provider location: [address], [name]  Evaluation Performed: Follow-up visit  PCP:  Sinda Du, MD  Cardiologist:  Kate Sable, MD  Electrophysiologist:  Dr Lovena Le  Chief Complaint:  "I still have a slight choking feeling."  History of Present Illness:    [name] is a 78 y.o. female who presents via audio/video conferencing for a telehealth visit today. She is a pleasant elderly woman with a chronic systolic heart failure, s/p ICD insertion,  Since last being seen in our clinic, the patient reports being sick though she does feel better. her legs were weak and she is having to use a walker. The patient denies symptoms of fevers, chills, cough, or new SOB worrisome for COVID 19.  Past Medical History:  Diagnosis Date  . A-fib ([place])    on Xarelto  . 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 Cerebrovascular accident [facility])    No clinical event; asymptomatic CT finding  . DDD (degenerative disc disease), cervical    Cervical  . Degenerative joint disease    Right hip and knee  . GERD (gastroesophageal reflux disease)   . Hip dislocation, right ([place])   . Hyperlipemia   . Hypertension   . Hypothyroid   . LBBB (left bundle branch block)   . S/P colonoscopy 2009   Dr. Sharlett Iles: reportedly normal per pt  . S/P endoscopy 1980s   per pt; normal. performed secondary to reflux    Past Surgical History:  Procedure Laterality Date  . ABDOMINAL  HYSTERECTOMY    . APPENDECTOMY    . BI-VENTRICULAR IMPLANTABLE CARDIOVERTER DEFIBRILLATOR  (CRT-D)  10-31-2013   MDT Hillery Aldo CRTD implanted by Dr Lovena Le  . BIOPSY  12/18/2018   Procedure: BIOPSY;  Surgeon: Daneil Dolin, MD;  Location: [facility] ENDO SU[facility];  Service: Endoscopy;;  Gastric   . BREAST BIOPSY     bilaterally; benign  . can not have MRI due to jaw surgery    . CHOLECYSTECTOMY    . COLONOSCOPY  2009   Dr. Sharlett Iles: Normal  . ESOPHAGOGASTRODUODENOSCOPY  07/13/2011   small hh, patchy gastric erythema with scarring deformity of antrum, chronic gastritis, bx benign with no H.Pylori. Procedure: ESOPHAGOGASTRODUODENOSCOPY (EGD);  Surgeon: Daneil Dolin, MD;  Location: [facility] ENDO SUITE;  Service: Endoscopy;  Laterality: N/A;  10:45  . ESOPHAGOGASTRODUODENOSCOPY N/A 12/18/2018   Procedure: ESOPHAGOGASTRODUODENOSCOPY (EGD);  Surgeon: Daneil Dolin, MD;  Location: [facility] ENDO SUITE;  Service: Endoscopy;  Laterality: N/A;  9:15am  . IMPLANTABLE CARDIOVERTER DEFIBRILLATOR IMPLANT N/A 10/31/2013   Procedure: IMPLANTABLE CARDIOVERTER DEFIBRILLATOR IMPLANT;  Surgeon: Evans Lance, MD;  Location: [facility] CATH LAB;  Service: Cardiovascular;  Laterality: N/A;  . Venia Minks DILATION N/A 12/18/2018   Procedure: Venia Minks DILATION;  Surgeon: Daneil Dolin, MD;  Location: [facility] ENDO [facility];  Service: Endoscopy;  Laterality: N/A;  . MANDIBLE SURGERY     secondary to malocclusion  . THYROIDECTOMY     neoplasm  . Ureteral Surgery  1970    Current Outpatient Medications  Medication Sig Dispense Refill  . benazepril (LOTENSIN) 40 MG tablet TAKE 1 TABLET BY MOUTH IN THE MORNING (Patient taking differently: Take 40 mg by mouth every morning. ) 90 tablet 3  . carvedilol (COREG) 25 MG tablet Take 1 tablet (25 mg total) by mouth 2 (two) times daily with a meal. 180 tablet 3  . cholecalciferol (VITAMIN D) 1000 UNITS tablet Take 1,000 Units by mouth daily.    . fexofenadine (ALLEGRA) 180 MG tablet Take 180 mg by mouth every  morning.     . fluticasone (FLONASE) 50 MCG/ACT nasal spray Place 2 sprays into the nose daily as needed for allergies.     . furosemide (LASIX) 40 MG tablet TAKE 1 TABLET BY MOUTH TWICE DAILY AS DIRECTED (Patient taking differently: Take 80 mg by mouth daily. ) 180 tablet 0  . hydrALAZINE (APRESOLINE) 25 MG tablet Take 25 mg by mouth 3 (three) times daily.    Marland Kitchen levothyroxine (SYNTHROID, LEVOTHROID) 112 MCG tablet Take 112 mcg by mouth daily before breakfast.    . pantoprazole (PROTONIX) 40 MG tablet Take 40 mg by mouth 2 (two) times daily.    . traMADol (ULTRAM) 50 MG tablet Take 50 mg by mouth every 6 (six) hours as needed for moderate pain.     Marland Kitchen XARELTO 20 MG TABS tablet TAKE 1 TABLET BY MOUTH ONCE DAILY WITH SUPPER (Patient taking differently: Take 20 mg by mouth daily with supper. ) 90 tablet 0   No current facility-administered medications for this visit.     Allergies:   Ciprofloxacin, Dexlansoprazole, and Statins   Social History:  The patient  reports that she has never smoked. She has never used smokeless tobacco. She reports that she does not drink alcohol or use drugs.   Family History:  The patient's  family history includes Atrial fibrillation in an other family member; Brain cancer in her mother.   ROS:  Please see the history of present illness.   All other systems are personally reviewed and negative.    Exam:    Vital Signs:  none   Labs/Other Tests and Data Reviewed:    Recent Labs: No results found for requested labs within last 8760 hours.   Wt Readings from Last 3 Encounters:  12/18/18 230 lb (104.3 kg)  05/07/18 241 lb (109.3 kg)  12/25/17 237 lb (107.5 kg)     Other studies personally reviewed:  Last device remote is reviewed from [facility] PDF dated 12/20/18 which reveals normal device function, no arrhythmias    ASSESSMENT & PLAN:    1.  Chronic systolic heart - her symptoms are class 3. She is sedentary. She is encouraged to maintain a low sodium  diet. 2. Choking - she is s/p esophageal dilatation 3. ICD - her device still has over a year of battery longevity. Normal function. LV threshold chronically elevated.   COVID 19 screen The patient denies symptoms of COVID 19 at this time.  The importance of social distancing was discussed today.  Follow-up:  6 months Next remote: 9/20  Current medicines are reviewed at length with the patient today.   The patient does not have concerns regarding her medicines.  The following changes were made today:  none  Labs/ tests ordered today include: none No orders of the defined types were placed in this encounter.    Patient Risk:  after full review of this patients clinical status, I feel that they are  at moderate risk at this time.  Today, I have spent 15 minutes with the patient with telehealth technology discussing all of the above .    Signed, Cristopher Peru, MD  01/01/2019 2:20 PM     [address] [place] [address] [phone] (office) [phone] (fax)

## 2019-01-03 DIAGNOSIS — E039 Hypothyroidism, unspecified: Secondary | ICD-10-CM | POA: Diagnosis not present

## 2019-01-03 LAB — TSH: TSH: 2.67 (ref 0.41–5.90)

## 2019-02-05 DIAGNOSIS — M19012 Primary osteoarthritis, left shoulder: Secondary | ICD-10-CM | POA: Diagnosis not present

## 2019-02-05 DIAGNOSIS — I10 Essential (primary) hypertension: Secondary | ICD-10-CM | POA: Diagnosis not present

## 2019-02-05 DIAGNOSIS — E039 Hypothyroidism, unspecified: Secondary | ICD-10-CM | POA: Diagnosis not present

## 2019-02-05 DIAGNOSIS — I5022 Chronic systolic (congestive) heart failure: Secondary | ICD-10-CM | POA: Diagnosis not present

## 2019-02-06 ENCOUNTER — Other Ambulatory Visit (HOSPITAL_COMMUNITY): Payer: Self-pay | Admitting: Pulmonary Disease

## 2019-02-06 ENCOUNTER — Other Ambulatory Visit: Payer: Self-pay | Admitting: Pulmonary Disease

## 2019-02-06 DIAGNOSIS — E039 Hypothyroidism, unspecified: Secondary | ICD-10-CM | POA: Diagnosis not present

## 2019-02-06 DIAGNOSIS — R1310 Dysphagia, unspecified: Secondary | ICD-10-CM

## 2019-02-06 DIAGNOSIS — I10 Essential (primary) hypertension: Secondary | ICD-10-CM | POA: Diagnosis not present

## 2019-02-06 DIAGNOSIS — M19012 Primary osteoarthritis, left shoulder: Secondary | ICD-10-CM | POA: Diagnosis not present

## 2019-02-06 DIAGNOSIS — I5022 Chronic systolic (congestive) heart failure: Secondary | ICD-10-CM | POA: Diagnosis not present

## 2019-02-06 LAB — BASIC METABOLIC PANEL
BUN: 12 (ref 4–21)
CO2: 31 — AB (ref 13–22)
Chloride: 100 (ref 99–108)
Creatinine: 1.1 (ref 0.5–1.1)
Glucose: 95
Potassium: 4.2 (ref 3.4–5.3)
Sodium: 140 (ref 137–147)

## 2019-02-06 LAB — COMPREHENSIVE METABOLIC PANEL
Calcium: 9.5 (ref 8.7–10.7)
GFR calc Af Amer: 56
GFR calc non Af Amer: 49

## 2019-02-07 ENCOUNTER — Other Ambulatory Visit: Payer: Self-pay | Admitting: Cardiovascular Disease

## 2019-02-14 ENCOUNTER — Other Ambulatory Visit: Payer: Self-pay

## 2019-02-14 ENCOUNTER — Ambulatory Visit (HOSPITAL_COMMUNITY)
Admission: RE | Admit: 2019-02-14 | Discharge: 2019-02-14 | Disposition: A | Discharge status: Home / Self Care | Payer: Medicare Other | Source: Ambulatory Visit | Attending: Pulmonary Disease | Admitting: Pulmonary Disease

## 2019-02-14 ENCOUNTER — Other Ambulatory Visit (HOSPITAL_COMMUNITY): Payer: Self-pay | Admitting: Pulmonary Disease

## 2019-02-14 DIAGNOSIS — R1310 Dysphagia, unspecified: Secondary | ICD-10-CM

## 2019-02-14 DIAGNOSIS — K224 Dyskinesia of esophagus: Secondary | ICD-10-CM | POA: Diagnosis not present

## 2019-03-04 ENCOUNTER — Ambulatory Visit: Payer: Medicare Other | Admitting: Gastroenterology

## 2019-03-04 ENCOUNTER — Other Ambulatory Visit: Payer: Self-pay

## 2019-03-04 ENCOUNTER — Encounter: Payer: Self-pay | Admitting: Gastroenterology

## 2019-03-04 ENCOUNTER — Telehealth: Payer: Self-pay | Admitting: Gastroenterology

## 2019-03-04 VITALS — BP 162/78 | HR 63 | Temp 97.3°F | Ht 67.0 in | Wt 241.8 lb

## 2019-03-04 DIAGNOSIS — I5022 Chronic systolic (congestive) heart failure: Secondary | ICD-10-CM | POA: Diagnosis not present

## 2019-03-04 DIAGNOSIS — R1319 Other dysphagia: Secondary | ICD-10-CM

## 2019-03-04 DIAGNOSIS — R1310 Dysphagia, unspecified: Secondary | ICD-10-CM | POA: Diagnosis not present

## 2019-03-04 DIAGNOSIS — K219 Gastro-esophageal reflux disease without esophagitis: Secondary | ICD-10-CM | POA: Diagnosis not present

## 2019-03-04 NOTE — Patient Instructions (Signed)
1. I will be in touch in a couple of days about potential medications to treat your swallowing issues.  2. Continue pantoprazole twice daily before a meal for now.

## 2019-03-04 NOTE — Telephone Encounter (Signed)
PT is aware to try the Altoid peppermint mints and call in 2 weeks with progress report.

## 2019-03-04 NOTE — Progress Notes (Signed)
Primary Care Physician: Sinda Du, MD  Primary Gastroenterologist:  Garfield Cornea, MD   Chief Complaint  Patient presents with  . Dysphagia    since DIL it has improved some but still feels like she is getting choked on everything she eats    HPI: Preciosa Bundrick Diguglielmo is a 78 y.o. female here for follow-up.  She underwent EGD back in June for dysphagia.  Esophagus was normal, status post empiric dilation, small hiatal hernia, erythematous mucosa in the stomach, but not biopsied.  Barium esophagram July 2020 for dysphagia and choking on solids and liquids, ordered by PCP.  Multiple single swallow attempts observed, which demonstrated failure to propagate any primary peristaltic waves.  Extensive tertiary contractions.  Structurally normal esophagus. Pill passed without difficulty.   Patient returns for follow-up.  She states esophageal dilation helped some but not significantly.  Every time eat or drink, acid comes up in "gobs". Every time tries to eat feels like choking.  Feels like everything sitting in the upper esophagus, won't go down.  No coughing or strangling.  Her biggest concerns screen regurgitation of acidic mucus.  Continues to take pantoprazole twice daily but she is afraid of potential side effects.  Has noted some improvement on twice daily dosing as far as reflux symptoms, has been on twice daily for 6 months or so. Sleeps reclined for multiple reasons.  No abdominal pain.  No bowel concerns.  No melena or rectal bleeding. Chronic muscles spasms of back/neck/abd wall for which she has require PT.     Current Outpatient Medications  Medication Sig Dispense Refill  . benazepril (LOTENSIN) 40 MG tablet TAKE 1 TABLET BY MOUTH IN THE MORNING (Patient taking differently: Take 40 mg by mouth every morning. ) 90 tablet 3  . carvedilol (COREG) 25 MG tablet Take 1 tablet (25 mg total) by mouth 2 (two) times daily with a meal. 180 tablet 3  . cholecalciferol (VITAMIN D) 1000 UNITS  tablet Take 1,000 Units by mouth daily.    . fexofenadine (ALLEGRA) 180 MG tablet Take 180 mg by mouth every morning.     . fluticasone (FLONASE) 50 MCG/ACT nasal spray Place 2 sprays into the nose daily as needed for allergies.     . furosemide (LASIX) 40 MG tablet TAKE 1 TABLET BY MOUTH TWICE DAILY AS DIRECTED (Patient taking differently: Take 80 mg by mouth daily. ) 180 tablet 0  . hydrALAZINE (APRESOLINE) 25 MG tablet Take 25 mg by mouth 3 (three) times daily.    Marland Kitchen levothyroxine (SYNTHROID, LEVOTHROID) 112 MCG tablet Take 112 mcg by mouth daily before breakfast.    . pantoprazole (PROTONIX) 40 MG tablet Take 40 mg by mouth 2 (two) times daily.    . traMADol (ULTRAM) 50 MG tablet Take 50 mg by mouth every 6 (six) hours as needed for moderate pain.     Marland Kitchen XARELTO 20 MG TABS tablet TAKE 1 TABLET BY MOUTH ONCE DAILY WITH SUPPER 90 tablet 0   No current facility-administered medications for this visit.     Allergies as of 03/04/2019 - Review Complete 03/04/2019  Allergen Reaction Noted  . Ciprofloxacin Cough 10/17/2013  . Dexlansoprazole  09/13/2011  . Statins Other (See Comments) 05/30/2012    ROS:  General: Negative for anorexia, weight loss, fever, chills, fatigue, weakness. ENT: Negative for hoarseness,  nasal congestion. See hpi CV: Negative for chest pain, angina, palpitations, dyspnea on exertion, peripheral edema.  Respiratory: Negative for dyspnea at rest, dyspnea  on exertion, cough, sputum, wheezing.  GI: See history of present illness. GU:  Negative for dysuria, hematuria, urinary incontinence, urinary frequency, nocturnal urination.  Endo: Negative for unusual weight change.    Physical Examination:   BP (!) 176/79   Pulse 63   Temp (!) 97.3 F (36.3 C) (Oral)   Ht 5\' 7"  (1.702 m)   Wt 241 lb 12.8 oz (109.7 kg)   BMI 37.87 kg/m   General: Well-nourished, well-developed in no acute distress.  Eyes: No icterus. Mouth: Oropharyngeal mucosa moist and pink , no lesions  erythema or exudate. Lungs: Clear to auscultation bilaterally.  Heart: Regular rate and rhythm, no murmurs rubs or gallops.  Abdomen: Bowel sounds are normal, nontender, nondistended, no hepatosplenomegaly or masses, no abdominal bruits or hernia , no rebound or guarding.   Extremities: No lower extremity edema. No clubbing or deformities. Neuro: Alert and oriented x 4   Skin: Warm and dry, no jaundice.   Psych: Alert and cooperative, normal mood and affect.   Imaging Studies: Dg Esophagus W Double Cm (hd)  Result Date: 02/14/2019 CLINICAL DATA:  78 year old female with history of esophageal dilatation a couple weeks ago. Dysphagia, choking on solids and liquids. EXAM: ESOPHOGRAM / BARIUM SWALLOW / BARIUM TABLET STUDY TECHNIQUE: Combined double contrast and single contrast examination performed using effervescent crystals, thick barium liquid, and thin barium liquid. The patient was observed with fluoroscopy swallowing a 13 mm barium sulphate tablet. FLUOROSCOPY TIME:  Fluoroscopy Time:  1 minutes and 24 seconds Radiation Exposure Index (if provided by the fluoroscopic device): 14.8 mGy Number of Acquired Spot Images: 0 COMPARISON:  None. FINDINGS: Double contrast images demonstrated a normal appearance of the esophageal mucosa. Multiple single swallow attempts were observed, which demonstrated failure to propagate many primary peristaltic waves. Extensive tertiary contractions were noted. Full column esophagram demonstrated no esophageal mass, stricture or esophageal ring. No hiatal hernia. Water siphon test demonstrated no gastroesophageal reflux. A barium tablet was administered, which passed readily into the stomach. IMPRESSION: 1. 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 2. Structurally normal esophagus. Electronically Signed   By: Vinnie Langton M.D.   On: 02/14/2019 09:18

## 2019-03-04 NOTE — Assessment & Plan Note (Addendum)
Persistent dysphagia s/p esophageal dilation. EGD showed normal esophagus. Recent barium esophagram with extensive tertiary contractions. Discussed consideration of esophageal manometry to further evaluate her dysphagia. Likely with esophageal motility disorder. She is not interested at this time. Could consider empiric treatment for esophageal spasms. Prior to trial of diltiazem or imipramine, would try peppermint oil as it has shown effects of relaxing esophageal smooth muscle.    Continue pantoprazole bid for now until improvement in symptoms.

## 2019-03-04 NOTE — Telephone Encounter (Signed)
Please let patient know that prior to trying diltiazem or imipramine for possible esophageal spasms, I would like her to try Altoid mints. Peppermint oil can relax smooth muscle of esophagus with little risk of side effects.   Take two Altoid mints (peppermint) and dissolve under the tongue before each meals.   Call in two weeks and let me know if any improvement in her regurgitation/dysphagia.

## 2019-03-20 ENCOUNTER — Encounter: Payer: Medicare Other | Admitting: *Deleted

## 2019-03-27 DIAGNOSIS — K224 Dyskinesia of esophagus: Secondary | ICD-10-CM | POA: Diagnosis not present

## 2019-03-27 DIAGNOSIS — Z23 Encounter for immunization: Secondary | ICD-10-CM | POA: Diagnosis not present

## 2019-03-27 DIAGNOSIS — I5022 Chronic systolic (congestive) heart failure: Secondary | ICD-10-CM | POA: Diagnosis not present

## 2019-03-27 DIAGNOSIS — I4891 Unspecified atrial fibrillation: Secondary | ICD-10-CM | POA: Diagnosis not present

## 2019-03-27 DIAGNOSIS — I10 Essential (primary) hypertension: Secondary | ICD-10-CM | POA: Diagnosis not present

## 2019-03-28 ENCOUNTER — Ambulatory Visit (INDEPENDENT_AMBULATORY_CARE_PROVIDER_SITE_OTHER): Payer: Medicare Other | Admitting: *Deleted

## 2019-03-28 DIAGNOSIS — I480 Paroxysmal atrial fibrillation: Secondary | ICD-10-CM | POA: Diagnosis not present

## 2019-03-28 DIAGNOSIS — I5022 Chronic systolic (congestive) heart failure: Secondary | ICD-10-CM

## 2019-03-29 LAB — CUP PACEART REMOTE DEVICE CHECK
Battery Remaining Longevity: 16 mo
Battery Voltage: 2.91 V
Brady Statistic AP VP Percent: 8.51 %
Brady Statistic AP VS Percent: 0.02 %
Brady Statistic AS VP Percent: 89.29 %
Brady Statistic AS VS Percent: 2.18 %
Brady Statistic RA Percent Paced: 8.22 %
Brady Statistic RV Percent Paced: 93.64 %
Date Time Interrogation Session: 20200911143818
HighPow Impedance: 73 Ohm
Implantable Lead Implant Date: 20150417
Implantable Lead Implant Date: 20150417
Implantable Lead Implant Date: 20150417
Implantable Lead Location: 753858
Implantable Lead Location: 753859
Implantable Lead Location: 753860
Implantable Lead Model: 4298
Implantable Lead Model: 5076
Implantable Lead Model: 6935
Implantable Pulse Generator Implant Date: 20150417
Lead Channel Impedance Value: 285 Ohm
Lead Channel Impedance Value: 342 Ohm
Lead Channel Impedance Value: 342 Ohm
Lead Channel Impedance Value: 342 Ohm
Lead Channel Impedance Value: 342 Ohm
Lead Channel Impedance Value: 342 Ohm
Lead Channel Impedance Value: 361 Ohm
Lead Channel Impedance Value: 418 Ohm
Lead Channel Impedance Value: 532 Ohm
Lead Channel Impedance Value: 532 Ohm
Lead Channel Impedance Value: 551 Ohm
Lead Channel Impedance Value: 551 Ohm
Lead Channel Impedance Value: 551 Ohm
Lead Channel Pacing Threshold Amplitude: 0.5 V
Lead Channel Pacing Threshold Amplitude: 0.75 V
Lead Channel Pacing Threshold Amplitude: 2.125 V
Lead Channel Pacing Threshold Pulse Width: 0.4 ms
Lead Channel Pacing Threshold Pulse Width: 0.4 ms
Lead Channel Pacing Threshold Pulse Width: 0.4 ms
Lead Channel Sensing Intrinsic Amplitude: 1.375 mV
Lead Channel Sensing Intrinsic Amplitude: 1.375 mV
Lead Channel Sensing Intrinsic Amplitude: 9 mV
Lead Channel Sensing Intrinsic Amplitude: 9 mV
Lead Channel Setting Pacing Amplitude: 2 V
Lead Channel Setting Pacing Amplitude: 2.5 V
Lead Channel Setting Pacing Amplitude: 3.25 V
Lead Channel Setting Pacing Pulse Width: 0.4 ms
Lead Channel Setting Pacing Pulse Width: 0.4 ms
Lead Channel Setting Sensing Sensitivity: 0.3 mV

## 2019-04-04 NOTE — Progress Notes (Signed)
Remote ICD transmission.   

## 2019-04-30 ENCOUNTER — Other Ambulatory Visit: Payer: Self-pay | Admitting: Cardiovascular Disease

## 2019-04-30 DIAGNOSIS — I5023 Acute on chronic systolic (congestive) heart failure: Secondary | ICD-10-CM

## 2019-05-27 DIAGNOSIS — I10 Essential (primary) hypertension: Secondary | ICD-10-CM | POA: Diagnosis not present

## 2019-05-27 DIAGNOSIS — I129 Hypertensive chronic kidney disease with stage 1 through stage 4 chronic kidney disease, or unspecified chronic kidney disease: Secondary | ICD-10-CM | POA: Diagnosis not present

## 2019-05-27 DIAGNOSIS — I5022 Chronic systolic (congestive) heart failure: Secondary | ICD-10-CM | POA: Diagnosis not present

## 2019-05-27 DIAGNOSIS — I429 Cardiomyopathy, unspecified: Secondary | ICD-10-CM | POA: Diagnosis not present

## 2019-06-04 ENCOUNTER — Other Ambulatory Visit: Payer: Self-pay | Admitting: Cardiovascular Disease

## 2019-06-04 DIAGNOSIS — I5023 Acute on chronic systolic (congestive) heart failure: Secondary | ICD-10-CM

## 2019-06-27 ENCOUNTER — Ambulatory Visit (INDEPENDENT_AMBULATORY_CARE_PROVIDER_SITE_OTHER): Payer: Medicare Other | Admitting: *Deleted

## 2019-06-27 DIAGNOSIS — I5022 Chronic systolic (congestive) heart failure: Secondary | ICD-10-CM

## 2019-06-29 LAB — CUP PACEART REMOTE DEVICE CHECK
Battery Remaining Longevity: 15 mo
Battery Voltage: 2.9 V
Brady Statistic AP VP Percent: 8.67 %
Brady Statistic AP VS Percent: 0.02 %
Brady Statistic AS VP Percent: 89.54 %
Brady Statistic AS VS Percent: 1.76 %
Brady Statistic RA Percent Paced: 8.4 %
Brady Statistic RV Percent Paced: 94.68 %
Date Time Interrogation Session: 20201213102407
HighPow Impedance: 69 Ohm
Implantable Lead Implant Date: 20150417
Implantable Lead Implant Date: 20150417
Implantable Lead Implant Date: 20150417
Implantable Lead Location: 753858
Implantable Lead Location: 753859
Implantable Lead Location: 753860
Implantable Lead Model: 4298
Implantable Lead Model: 5076
Implantable Lead Model: 6935
Implantable Pulse Generator Implant Date: 20150417
Lead Channel Impedance Value: 285 Ohm
Lead Channel Impedance Value: 285 Ohm
Lead Channel Impedance Value: 304 Ohm
Lead Channel Impedance Value: 304 Ohm
Lead Channel Impedance Value: 342 Ohm
Lead Channel Impedance Value: 342 Ohm
Lead Channel Impedance Value: 342 Ohm
Lead Channel Impedance Value: 475 Ohm
Lead Channel Impedance Value: 513 Ohm
Lead Channel Impedance Value: 513 Ohm
Lead Channel Impedance Value: 513 Ohm
Lead Channel Impedance Value: 513 Ohm
Lead Channel Impedance Value: 513 Ohm
Lead Channel Pacing Threshold Amplitude: 0.5 V
Lead Channel Pacing Threshold Amplitude: 0.75 V
Lead Channel Pacing Threshold Amplitude: 2.25 V
Lead Channel Pacing Threshold Pulse Width: 0.4 ms
Lead Channel Pacing Threshold Pulse Width: 0.4 ms
Lead Channel Pacing Threshold Pulse Width: 0.4 ms
Lead Channel Sensing Intrinsic Amplitude: 1.625 mV
Lead Channel Sensing Intrinsic Amplitude: 1.625 mV
Lead Channel Sensing Intrinsic Amplitude: 9.875 mV
Lead Channel Sensing Intrinsic Amplitude: 9.875 mV
Lead Channel Setting Pacing Amplitude: 2 V
Lead Channel Setting Pacing Amplitude: 2.5 V
Lead Channel Setting Pacing Amplitude: 3.25 V
Lead Channel Setting Pacing Pulse Width: 0.4 ms
Lead Channel Setting Pacing Pulse Width: 0.4 ms
Lead Channel Setting Sensing Sensitivity: 0.3 mV

## 2019-07-07 ENCOUNTER — Other Ambulatory Visit: Payer: Self-pay | Admitting: Cardiovascular Disease

## 2019-07-07 DIAGNOSIS — I5023 Acute on chronic systolic (congestive) heart failure: Secondary | ICD-10-CM

## 2019-07-14 ENCOUNTER — Other Ambulatory Visit: Payer: Self-pay

## 2019-07-14 ENCOUNTER — Encounter: Payer: Self-pay | Admitting: Family Medicine

## 2019-07-14 ENCOUNTER — Ambulatory Visit (INDEPENDENT_AMBULATORY_CARE_PROVIDER_SITE_OTHER): Payer: Medicare Other | Admitting: Family Medicine

## 2019-07-14 VITALS — BP 160/80 | HR 68 | Temp 97.8°F | Ht 67.0 in | Wt 239.0 lb

## 2019-07-14 DIAGNOSIS — R1310 Dysphagia, unspecified: Secondary | ICD-10-CM | POA: Diagnosis not present

## 2019-07-14 DIAGNOSIS — I10 Essential (primary) hypertension: Secondary | ICD-10-CM

## 2019-07-14 DIAGNOSIS — E039 Hypothyroidism, unspecified: Secondary | ICD-10-CM | POA: Diagnosis not present

## 2019-07-14 DIAGNOSIS — R1319 Other dysphagia: Secondary | ICD-10-CM

## 2019-07-14 DIAGNOSIS — E785 Hyperlipidemia, unspecified: Secondary | ICD-10-CM | POA: Diagnosis not present

## 2019-07-14 DIAGNOSIS — E559 Vitamin D deficiency, unspecified: Secondary | ICD-10-CM

## 2019-07-14 NOTE — Progress Notes (Signed)
New Patient Office Visit  Subjective:  Patient ID: [name], female    DOB: May 13, 1941  Age: 78 y.o. MRN: [id number] Prescriptions Total Prescriptions: 15   Total Private Pay: 1   Fill Date ID   Written Drug Qty Days Prescriber Rx # Pharmacy Refill   Daily Dose* Pymt Type PMP    06/11/2019  1   06/11/2019  Tramadol Hcl 50 MG Tablet  100.00  13 Ed Haw   [id number]   [facility] ([id number])   0  38.46 MME  Medicare   [place]  04/30/2019  1   12/19/2018  Tramadol Hcl 50 MG Tablet  100.00  13 Ed Haw   [id number]   Wal ([id number])   3  38.46 MME  Medicare   [place]  03/20/2019  1   12/19/2018  Tramadol Hcl 50 MG Tablet  100.00  13 Ed Haw   [id number]   Wal ([id number])   2  38.46 MME  Medicare   [place]  02/07/2019  1   12/19/2018  Tramadol Hcl 50 MG Tablet  100.00  13 Ed Haw   [id number]   [facility] ([id number])   1  38.46 MME  Medicare   [place]  12/20/2018  1   12/19/2018  Tramadol Hcl 50 MG Tablet  100.00  13 Ed Haw   [id number]   [facility] ([id number])   0  38.46 MME  Medicare   [place]  10/19/2018  1   05/29/2018  Tramadol Hcl 50 MG Tablet  100.00  13 Ed Haw   [id number]   [facility] ([id number])   3  38.46 MME  Medicare   [place]  08/24/2018  1   05/29/2018  Tramadol Hcl 50 MG Tablet  100.00  13 Ed Haw   [id number]   [facility] ([id number])   2  38.46 MME  Private Pay   [place]  07/06/2018  1   05/29/2018  Tramadol Hcl 50 MG Tablet  100.00  13 Ed Haw   [id number]   [facility] ([id number])   1  38.46 MME  Medicare   [place]  05/29/2018  1   05/29/2018  Tramadol Hcl 50 MG Tablet  100.00  13 Ed Haw   [id number]   [facility] ([id number])   0  38.46 MME  Medicare   [place]  04/07/2018  1   10/29/2017  Tramadol Hcl 50 MG Tablet  100.00  13 Ed Haw   [id number]   [facility] ([id number])   3  38.46 MME  Medicare   [place]  02/04/2018  1   10/29/2017  Tramadol Hcl 50 MG Tablet  100.00  13 Ed Haw   [id number]   [facility] ([id number])   2  38.46 MME  Medicare   [place]  12/27/2017  1   10/29/2017  Tramadol Hcl 50 MG Tablet  100.00  13 Ed Haw   [id number]   [facility] ([id number])   1  38.46 MME  Medicare   [place]  10/30/2017  1   10/29/2017  Tramadol Hcl 50 MG Tablet  100.00  13 Ed Haw   [id number]   [facility] ([id number])   0  38.46 MME   Medicare   [place]  09/07/2017  1   04/23/2017  Tramadol Hcl 50 MG Tablet  100.00  13 Ed Haw   [id number]   [facility] ([id number])   3  38.46 MME  Medicare   [place]  07/25/2017  1   04/23/2017  Tramadol Hcl 50 MG Tablet  100.00  13 Ed Haw  [id number] ([id number])   2  38.46 MME  Medicare   [place]    CC:  Chief Complaint  Patient presents with  . Establish Care  . 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 A-fib ([place])    on Xarelto  . Allergy   . Anemia   . Blood transfusion without reported diagnosis   . 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 Cataract   . Cerebrovascular accident [facility])    No clinical event; asymptomatic CT finding  . CHF (congestive heart failure) ([place])   . Chronic kidney disease   . COPD (chronic obstructive pulmonary disease) ([place])   . DDD (degenerative disc disease), cervical    Cervical  . Degenerative joint disease    Right hip and knee  . GERD (gastroesophageal reflux disease)   . Hip dislocation, right ([place])   . Hyperlipemia   . Hypertension   . Hypothyroid   . LBBB (left bundle branch block)   . Neuromuscular disorder ([place])   . S/P colonoscopy 2009   Dr. Sharlett Iles: reportedly normal per pt  . S/P endoscopy 1980s   per pt; normal. performed secondary to reflux    Past Surgical History:  Procedure Laterality Date   . ABDOMINAL HYSTERECTOMY    . APPENDECTOMY    . BI-VENTRICULAR IMPLANTABLE CARDIOVERTER DEFIBRILLATOR  (CRT-D)  10-31-2013   MDT Hillery Aldo CRTD implanted by Dr Lovena Le  . BIOPSY  12/18/2018   Procedure: BIOPSY;  Surgeon: Daneil Dolin, MD;  [facility];  Service: Endoscopy;;  Gastric   . BREAST BIOPSY     bilaterally; benign  . can not have MRI due to jaw surgery    . CHOLECYSTECTOMY    . COLONOSCOPY  2009   Dr. Sharlett Iles: Normal  . ESOPHAGOGASTRODUODENOSCOPY  07/13/2011   small hh, patchy gastric erythema with scarring deformity of antrum, chronic gastritis, bx benign with no H.Pylori. Procedure: ESOPHAGOGASTRODUODENOSCOPY (EGD);  Surgeon: Daneil Dolin, MD;  Location: [facility];  Service: Endoscopy;  Laterality: N/A;  10:45  . ESOPHAGOGASTRODUODENOSCOPY N/A 12/18/2018   Dr. Gala Romney: normal esophagus s/p dilation, small hh, erythema in stomach with benign biopsy.   . IMPLANTABLE CARDIOVERTER DEFIBRILLATOR IMPLANT N/A 10/31/2013   Procedure: IMPLANTABLE CARDIOVERTER DEFIBRILLATOR IMPLANT;  Surgeon: Evans Lance, MD;  Location: [facility] CATH LAB;  Service: Cardiovascular;  Laterality: N/A;  . Venia Minks DILATION N/A 12/18/2018   Procedure: Venia Minks DILATION;  Surgeon: Daneil Dolin, MD;  Location: [facility];  Service: Endoscopy;  Laterality: N/A;  . MANDIBLE SURGERY     secondary to malocclusion  . THYROIDECTOMY     neoplasm  . Ureteral Surgery  1970    Family History  Problem Relation Age of Onset  . Brain cancer Mother   . Heart disease Mother   . Hypertension Mother   . Stroke Mother   . Atrial fibrillation Other   . Stroke Brother   .  Colon cancer Neg Hx     Social History   Socioeconomic History  . Marital status: Widowed    Spouse name: Not on file  . Number of children: 2  . Years of education: Not on file  . Highest education level: Not on file  Occupational History  . Occupation: retired  Tobacco Use  . Smoking status: Never Smoker  . Smokeless  tobacco: Never Used  Substance and Sexual Activity  . Alcohol use: No    Alcohol/week: 0.0 standard drinks  . Drug use: No  . Sexual activity: Never    Partners: Male  Other Topics Concern  . Not on file  Social History Narrative   Widowed         Social Determinants of Health   Financial Resource Strain:   . Difficulty of Paying Living Expenses: Not on file  Food Insecurity:   . Worried About Charity fundraiser in the Last Year: Not on file  . Ran Out of Food in the Last Year: Not on file  Transportation Needs:   . Lack of Transportation (Medical): Not on file  . Lack of Transportation (Non-Medical): Not on file  Physical Activity:   . Days of Exercise per Week: Not on file  . Minutes of Exercise per Session: Not on file  Stress:   . Feeling of Stress : Not on file  Social Connections:   . Frequency of Communication with Friends and Family: Not on file  . Frequency of Social Gatherings with Friends and Family: Not on file  . Attends Religious Services: Not on file  . Active Member of Clubs or Organizations: Not on file  . Attends Archivist Meetings: Not on file  . Marital Status: Not on file  Intimate Partner Violence:   . Fear of Current or Ex-Partner: Not on file  . Emotionally Abused: Not on file  . Physically Abused: Not on file  . Sexually Abused: Not on file    ROS Review of Systems  HENT: Negative.        Glasses  Eyes:       Glasses  Respiratory: Negative.   Cardiovascular:       Varicosities bilat LE  Gastrointestinal:       Edg-dilation Taking protonix  Endocrine:       Thyroid cancer  Musculoskeletal: Positive for arthralgias, myalgias and neck pain.  Skin:       Derm-yearly-skin cancer  Allergic/Immunologic:       PEN allergy  Neurological: Negative.   Hematological: Negative.   Psychiatric/Behavioral: Negative.     Objective:   Today's Vitals: BP (!) 160/80 (BP Location: Left Arm, Patient Position: Sitting, Cuff Size:  Normal)   Pulse 68   Temp 97.8 F (36.6 C) (Oral)   Ht 5\' 7"  (1.702 m)   Wt 239 lb (108.4 kg)   SpO2 97%   BMI 37.43 kg/m   Physical Exam Constitutional:      Appearance: Normal appearance.  HENT:     Head: Normocephalic and atraumatic.  Cardiovascular:     Rate and Rhythm: Normal rate and regular rhythm.     Pulses: Normal pulses.     Heart sounds: Normal heart sounds.  Pulmonary:     Effort: Pulmonary effort is normal.     Breath sounds: Normal breath sounds.  Neurological:     Mental Status: She is alert and oriented to person, place, and time.     Assessment & Plan:  1. Hypothyroidism, unspecified type TSH Thyroid cancer in the past 2. Hyperlipidemia, unspecified hyperlipidemia type Lipid panel-statins NOT tolerated-muscle pain cmp 3. Essential hypertension cmp Slightly elevated-hydralazine, coreg, lotensin/lasix-only one dose today u/a 4. Esophageal dysphagia protonix-recent egd  Outpatient Encounter Medications as of 07/14/2019  Medication Sig  . ondansetron (ZOFRAN) 4 MG tablet Take 4 mg by mouth every 8 (eight) hours as needed for nausea or vomiting.  . benazepril (LOTENSIN) 40 MG tablet TAKE 1 TABLET BY MOUTH IN THE MORNING  . carvedilol (COREG) 25 MG tablet TAKE 1 TABLET BY MOUTH TWICE DAILY WITH A MEAL  . cholecalciferol (VITAMIN D) 1000 UNITS tablet Take 1,000 Units by mouth daily.  . fexofenadine (ALLEGRA) 180 MG tablet Take 180 mg by mouth every morning.   . fluticasone (FLONASE) 50 MCG/ACT nasal spray Place 2 sprays into the nose daily as needed for allergies.   . furosemide (LASIX) 40 MG tablet TAKE 1 TABLET BY MOUTH TWICE DAILY AS DIRECTED  . hydrALAZINE (APRESOLINE) 25 MG tablet Take 25 mg by mouth 3 (three) times daily.  Marland Kitchen levothyroxine (SYNTHROID, LEVOTHROID) 112 MCG tablet Take 112 mcg by mouth daily before breakfast.  . pantoprazole (PROTONIX) 40 MG tablet Take 40 mg by mouth 2 (two) times daily.  . traMADol (ULTRAM) 50 MG tablet Take 50 mg  by mouth every 6 (six) hours as needed for moderate pain.   Marland Kitchen XARELTO 20 MG TABS tablet TAKE 1 TABLET BY MOUTH ONCE DAILY WITH SUPPER   No facility-administered encounter medications on file as of 07/14/2019.    Follow-up: 6 month  Chaniya Genter Hannah Beat, MD

## 2019-07-14 NOTE — Patient Instructions (Signed)
xyzal-may help with itching aveno lotion Fasting labwork

## 2019-07-21 ENCOUNTER — Telehealth: Payer: Self-pay

## 2019-07-21 DIAGNOSIS — I10 Essential (primary) hypertension: Secondary | ICD-10-CM | POA: Diagnosis not present

## 2019-07-21 DIAGNOSIS — E785 Hyperlipidemia, unspecified: Secondary | ICD-10-CM | POA: Diagnosis not present

## 2019-07-21 DIAGNOSIS — E559 Vitamin D deficiency, unspecified: Secondary | ICD-10-CM | POA: Diagnosis not present

## 2019-07-21 DIAGNOSIS — E039 Hypothyroidism, unspecified: Secondary | ICD-10-CM | POA: Diagnosis not present

## 2019-07-21 NOTE — Telephone Encounter (Signed)
LeighAnn Bogdan Vivona, CMA  

## 2019-07-22 LAB — LIPID PANEL
Cholesterol: 205 mg/dL — ABNORMAL HIGH
HDL: 47 mg/dL — ABNORMAL LOW
LDL Cholesterol (Calc): 141 mg/dL — ABNORMAL HIGH
Non-HDL Cholesterol (Calc): 158 mg/dL — ABNORMAL HIGH
Total CHOL/HDL Ratio: 4.4 (calc)
Triglycerides: 77 mg/dL

## 2019-07-22 LAB — URINALYSIS, MICROSCOPIC ONLY
Bacteria, UA: NONE SEEN /HPF
Hyaline Cast: NONE SEEN /LPF
RBC / HPF: NONE SEEN /HPF (ref 0–2)
Squamous Epithelial / HPF: NONE SEEN /HPF (ref <=5)
WBC, UA: NONE SEEN /HPF (ref 0–5)

## 2019-07-22 LAB — COMPLETE METABOLIC PANEL WITH GFR
AG Ratio: 1.5 (calc) (ref 1.0–2.5)
ALT: 10 U/L (ref 6–29)
AST: 13 U/L (ref 10–35)
Albumin: 4 g/dL (ref 3.6–5.1)
Alkaline phosphatase (APISO): 63 U/L (ref 37–153)
BUN/Creatinine Ratio: 15 (calc) (ref 6–22)
BUN: 17 mg/dL (ref 7–25)
CO2: 31 mmol/L (ref 20–32)
Calcium: 10 mg/dL (ref 8.6–10.4)
Chloride: 100 mmol/L (ref 98–110)
Creat: 1.17 mg/dL — ABNORMAL HIGH (ref 0.60–0.93)
GFR, Est African American: 51 mL/min/{1.73_m2} — ABNORMAL LOW (ref >=60)
GFR, Est Non African American: 44 mL/min/{1.73_m2} — ABNORMAL LOW (ref >=60)
Globulin: 2.7 g/dL (calc) (ref 1.9–3.7)
Glucose, Bld: 98 mg/dL (ref 65–99)
Potassium: 4 mmol/L (ref 3.5–5.3)
Sodium: 139 mmol/L (ref 135–146)
Total Bilirubin: 0.6 mg/dL (ref 0.2–1.2)
Total Protein: 6.7 g/dL (ref 6.1–8.1)

## 2019-07-22 LAB — VITAMIN D 25 HYDROXY (VIT D DEFICIENCY, FRACTURES): Vit D, 25-Hydroxy: 31 ng/mL (ref 30–100)

## 2019-07-22 LAB — TSH: TSH: 5.4 mIU/L — ABNORMAL HIGH (ref 0.40–4.50)

## 2019-07-24 ENCOUNTER — Other Ambulatory Visit: Payer: Self-pay

## 2019-07-24 ENCOUNTER — Telehealth (INDEPENDENT_AMBULATORY_CARE_PROVIDER_SITE_OTHER): Payer: Medicare HMO | Admitting: Family Medicine

## 2019-07-24 DIAGNOSIS — E559 Vitamin D deficiency, unspecified: Secondary | ICD-10-CM

## 2019-07-24 DIAGNOSIS — E785 Hyperlipidemia, unspecified: Secondary | ICD-10-CM | POA: Diagnosis not present

## 2019-07-24 DIAGNOSIS — E039 Hypothyroidism, unspecified: Secondary | ICD-10-CM

## 2019-07-24 NOTE — Progress Notes (Addendum)
Virtual Visit via Telephone Note  I connected with [name] on 07/24/19 at 11:00 AM EST by telephone and verified that I am speaking with the correct person using two identifiers.DOB/address  Location: Patient: home Provider: office   I discussed the limitations, risks, security and privacy concerns of performing an evaluation and management service by telephone and the availability of in person appointments. I also discussed with the patient that there may be a patient responsible charge related to this service. The patient expressed understanding and agreed to proceed.   History of Present Illness: Discussion about labwork   Observations/Objective: TSH elevated 5.4 LDL elevated 141 GFR-low-44 Vit D -low normal 25  Assessment and Plan: 1. Hyperlipidemia, unspecified hyperlipidemia type LDL elevated-pt declines statins-diet change, exercise suggested  2. Vitamin D deficiency Vit D -2000IU daily-d/w pt concern about low Vit D  3. Hypothyroidism, unspecified type TSH-increase dose to 125mg  daily-rx to pharmacy  Follow Up Instructions: Take additional Vit D    I discussed the assessment and treatment plan with the patient. The patient was provided an opportunity to ask questions and all were answered. The patient agreed with the plan and demonstrated an understanding of the instructions.   The patient was advised to call back or seek an in-person evaluation if the symptoms worsen or if the condition fails to improve as anticipated.  I provided 8 minutes of non-face-to-face time during this encounter.   Karen Huhta Hannah Beat, MD

## 2019-07-28 ENCOUNTER — Other Ambulatory Visit: Payer: Self-pay | Admitting: Cardiovascular Disease

## 2019-07-31 ENCOUNTER — Other Ambulatory Visit: Payer: Self-pay | Admitting: Family Medicine

## 2019-07-31 ENCOUNTER — Telehealth: Payer: Self-pay | Admitting: Family Medicine

## 2019-07-31 DIAGNOSIS — E039 Hypothyroidism, unspecified: Secondary | ICD-10-CM

## 2019-07-31 MED ORDER — LEVOTHYROXINE SODIUM 125 MCG PO TABS: 125.0000 ug | ORAL_TABLET | Freq: Every day | ORAL | 1 refills | Status: DC

## 2019-07-31 NOTE — Telephone Encounter (Signed)
I called pt and informed her, also mailed her lab orders.

## 2019-07-31 NOTE — Telephone Encounter (Signed)
Synthroid 170mcg daily(increased dose)  TSH in 6 week-non fasting-please send order

## 2019-07-31 NOTE — Telephone Encounter (Signed)
Routing to Dr. Corum for advice ? 

## 2019-07-31 NOTE — Telephone Encounter (Signed)
Patient is calling and states she is needing a refill on levothyroxine (SYNTHROID, LEVOTHROID) 112 MCG tablet  She was under impression this was already sent to pharmacy. She states they discussed increasing the dosage  [place], [name] - [address] [place] Phone:  [phone]  Fax:  [phone]

## 2019-07-31 NOTE — Telephone Encounter (Signed)
noted 

## 2019-08-08 ENCOUNTER — Other Ambulatory Visit: Payer: Self-pay | Admitting: Cardiovascular Disease

## 2019-08-08 DIAGNOSIS — I5023 Acute on chronic systolic (congestive) heart failure: Secondary | ICD-10-CM

## 2019-08-11 ENCOUNTER — Telehealth: Payer: Self-pay

## 2019-08-11 DIAGNOSIS — R1310 Dysphagia, unspecified: Secondary | ICD-10-CM

## 2019-08-11 DIAGNOSIS — Z889 Allergy status to unspecified drugs, medicaments and biological substances status: Secondary | ICD-10-CM

## 2019-08-11 DIAGNOSIS — R1319 Other dysphagia: Secondary | ICD-10-CM

## 2019-08-11 MED ORDER — FLUTICASONE PROPIONATE 50 MCG/ACT NA SUSP: 2.0000 | Freq: Every day | NASAL | 0 refills | Status: AC | PRN

## 2019-08-11 MED ORDER — PANTOPRAZOLE SODIUM 40 MG PO TBEC: 40.0000 mg | DELAYED_RELEASE_TABLET | Freq: Two times a day (BID) | ORAL | 0 refills | Status: DC

## 2019-08-11 NOTE — Telephone Encounter (Signed)
[name], CMA  

## 2019-08-18 DIAGNOSIS — H5200 Hypermetropia, unspecified eye: Secondary | ICD-10-CM | POA: Diagnosis not present

## 2019-08-18 DIAGNOSIS — Z0100 Encounter for examination of eyes and vision without abnormal findings: Secondary | ICD-10-CM | POA: Diagnosis not present

## 2019-09-08 ENCOUNTER — Telehealth: Payer: Self-pay | Admitting: Specialist

## 2019-09-08 NOTE — Telephone Encounter (Signed)
Patient called. She would like a rx for Tramadol called in. Her call back number [phone]

## 2019-09-09 NOTE — Telephone Encounter (Signed)
I called and advised that since she has not been seen since 05/2017 that we would need to see her first so we have documentation as to why we are giving her the pain meds.  She does have an appt on 09/25/2019. I have put her on the cancellation list so if someone cancels or if we open his schedule we can get her in.  She states that she understands.

## 2019-09-15 ENCOUNTER — Encounter: Payer: Self-pay | Admitting: Specialist

## 2019-09-15 ENCOUNTER — Ambulatory Visit (INDEPENDENT_AMBULATORY_CARE_PROVIDER_SITE_OTHER): Payer: Medicare HMO

## 2019-09-15 ENCOUNTER — Ambulatory Visit (INDEPENDENT_AMBULATORY_CARE_PROVIDER_SITE_OTHER): Payer: Medicare HMO | Admitting: Specialist

## 2019-09-15 ENCOUNTER — Other Ambulatory Visit: Payer: Self-pay

## 2019-09-15 VITALS — BP 175/82 | HR 67 | Ht 67.0 in | Wt 225.0 lb

## 2019-09-15 DIAGNOSIS — M25512 Pain in left shoulder: Secondary | ICD-10-CM | POA: Diagnosis not present

## 2019-09-15 DIAGNOSIS — M19012 Primary osteoarthritis, left shoulder: Secondary | ICD-10-CM

## 2019-09-15 DIAGNOSIS — M7552 Bursitis of left shoulder: Secondary | ICD-10-CM

## 2019-09-15 DIAGNOSIS — M7522 Bicipital tendinitis, left shoulder: Secondary | ICD-10-CM

## 2019-09-15 MED ORDER — METHYLPREDNISOLONE ACETATE 40 MG/ML IJ SUSP
40.0000 mg | INTRAMUSCULAR | Status: AC | PRN
  Administered 2019-09-15: 40 mg via INTRA_ARTICULAR

## 2019-09-15 MED ORDER — TRAMADOL HCL 50 MG PO TABS: 100.0000 mg | ORAL_TABLET | Freq: Four times a day (QID) | ORAL | 0 refills | Status: DC | PRN

## 2019-09-15 MED ORDER — BUPIVACAINE HCL 0.5 % IJ SOLN
3.0000 mL | INTRAMUSCULAR | Status: AC | PRN
  Administered 2019-09-15: 3 mL via INTRA_ARTICULAR

## 2019-09-15 NOTE — Progress Notes (Signed)
Office Visit Note   Patient: [name]           Date of Birth: 1940-11-13           MRN: [id number] Visit Date: 09/15/2019              Requested by: Mary Hancock, MD [address] [place],  [place] [address] PCP: Mary Hancock, MD   Assessment & Plan: Visit Diagnoses:  1. Left shoulder pain, unspecified chronicity   2. Bicipital tendonitis of shoulder, left   3. Bursitis of left shoulder   4. Primary osteoarthritis, left shoulder     Plan: Avoid overhead lifting and overhead use of the arms. Do not lift greater than 10 lbs. Tylenol ES one every 6-8 hours for pain and inflamation. Call if you are having right knee pain and need to consider a cortisone injection into the right knee. Restart PT for 4-6weeks, then a home exercise program.  Follow-Up Instructions: Return in about 4 weeks (around 10/13/2019).   Orders:  Orders Placed This Encounter  Procedures  . Large Joint Inj: L glenohumeral  . XR Shoulder Left  . Ambulatory referral to Physical Therapy   Meds ordered this encounter  Medications  . traMADol (ULTRAM) 50 MG tablet    Sig: Take 2 tablets (100 mg total) by mouth every 6 (six) hours as needed for up to 7 days for moderate pain.    Dispense:  40 tablet    Refill:  0      Procedures: Large Joint Inj: L glenohumeral on 09/15/2019 5:10 PM Indications: pain Details: 25 G 1.5 in needle, anterolateral approach  Arthrogram: No  Medications: 40 mg methylPREDNISolone acetate 40 MG/ML; 3 mL bupivacaine 0.5 % Outcome: tolerated well, no immediate complications Procedure, treatment alternatives, risks and benefits explained, specific risks discussed. Consent was given by the patient. Immediately prior to procedure a time out was called to verify the correct patient, procedure, equipment, support staff and site/side marked as required. Patient was prepped and draped in the usual sterile fashion.       Clinical Data: No additional  findings.   Subjective: Chief Complaint  Patient presents with  . Left Shoulder - Pain    79 year old right handed female with history of previous neck and left arm pain and numbness improved with PT in the past not with   worsening pain into the left shoulder anteriorly and pain with overhead use of the arm and with lying on the side but she sleeps mainly on her back. She has a defibrillator in place and this limits her use of PT.    Review of Systems   Objective: Vital Signs: BP (!) 175/82 (BP Location: Left Arm, Patient Position: Sitting)   Pulse 67   Ht 5\' 7"  (1.702 m)   Wt 225 lb (102.1 kg)   BMI 35.24 kg/m   Physical Exam  Ortho Exam  Specialty Comments:  No specialty comments available.  Imaging: XR Shoulder Left  Result Date: 09/15/2019 AP axillary lateral and thoracic outlet view shows left shoulder with a anterior inferior acromion process spur, SAS is well maintained. The Axillary lateral view with moderate narrowing of the G-H joint with an anterior glenoid spur. The left A-C joint is moderately narrowed. Findings of mild osteoarthritis left shoulder.     PMFS History: Patient Active Problem List   Diagnosis Date Noted  . Vitamin D deficiency 07/14/2019  . Esophageal dysphagia 12/10/2018  . RUQ  pain 09/17/2017  . Right flank pain 09/17/2017  . 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 Atrial fibrillation ([place]) 11/17/2013  . Chronic systolic heart failure ([place]) 10/13/2013  . Hyponatremia 03/04/2013  . Constipation 09/13/2011  . GERD 09/13/2010  . Hypothyroidism 04/28/2009  . Hyperlipidemia 04/28/2009  . Hypertension 04/28/2009  . COPD (chronic obstructive pulmonary disease) ([place]) 04/28/2009  . LEFT BUNDLE BRANCH BLOCK 04/28/2009  . DEGENERATIVE JOINT DISEASE 04/28/2009   Past Medical History:  Diagnosis Date  . A-fib ([place])    on Xarelto  . Allergy   . Anemia   . Blood transfusion without reported  diagnosis   . 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 Cataract   . Cerebrovascular accident [facility])    No clinical event; asymptomatic CT finding  . CHF (congestive heart failure) ([place])   . Chronic kidney disease   . COPD (chronic obstructive pulmonary disease) ([place])   . DDD (degenerative disc disease), cervical    Cervical  . Degenerative joint disease    Right hip and knee  . GERD (gastroesophageal reflux disease)   . Hip dislocation, right ([place])   . Hyperlipemia   . Hypertension   . Hypothyroid   . LBBB (left bundle branch block)   . Neuromuscular disorder ([place])   . S/P colonoscopy 2009   Dr. Sharlett Iles: reportedly normal per pt  . S/P endoscopy 1980s   per pt; normal. performed secondary to reflux    Family History  Problem Relation Age of Onset  . Brain cancer Mother   . Heart disease Mother   . Hypertension Mother   . Stroke Mother   . Atrial fibrillation Other   . Stroke Brother   . Colon cancer Neg Hx     Past Surgical History:  Procedure Laterality Date  . ABDOMINAL HYSTERECTOMY    . APPENDECTOMY    . BI-VENTRICULAR IMPLANTABLE CARDIOVERTER DEFIBRILLATOR  (CRT-D)  10-31-2013   MDT Hillery Aldo CRTD implanted by Dr Lovena Le  . BIOPSY  12/18/2018   Procedure: BIOPSY;  Surgeon: Daneil Dolin, MD;  [facility];  Service: Endoscopy;;  Gastric   . BREAST BIOPSY     bilaterally; benign  . can not have MRI due to jaw surgery    . CHOLECYSTECTOMY    . COLONOSCOPY  2009   Dr. Sharlett Iles: Normal  . ESOPHAGOGASTRODUODENOSCOPY  07/13/2011   small hh, patchy gastric erythema with scarring deformity of antrum, chronic gastritis, bx benign with no H.Pylori. Procedure: ESOPHAGOGASTRODUODENOSCOPY (EGD);  Surgeon: Daneil Dolin, MD;  [facility];  Service: Endoscopy;  Laterality: N/A;  10:45  . ESOPHAGOGASTRODUODENOSCOPY N/A 12/18/2018   Dr. Gala Romney: normal  esophagus s/p dilation, small hh, erythema in stomach with benign biopsy.   . IMPLANTABLE CARDIOVERTER DEFIBRILLATOR IMPLANT N/A 10/31/2013   Procedure: IMPLANTABLE CARDIOVERTER DEFIBRILLATOR IMPLANT;  Surgeon: Evans Lance, MD;  Location: [facility] CATH LAB;  Service: Cardiovascular;  Laterality: N/A;  . Venia Minks DILATION N/A 12/18/2018   Procedure: Venia Minks DILATION;  Surgeon: Daneil Dolin, MD;  Location: [facility];  Service: Endoscopy;  Laterality: N/A;  . MANDIBLE SURGERY     secondary to malocclusion  . THYROIDECTOMY     neoplasm  . Ureteral Surgery  1970   Social History   Occupational History  . Occupation: retired  Tobacco Use  . Smoking status: Never Smoker  . Smokeless  tobacco: Never Used  Substance and Sexual Activity  . Alcohol use: No    Alcohol/week: 0.0 standard drinks  . Drug use: No  . Sexual activity: Never    Partners: Male

## 2019-09-22 ENCOUNTER — Other Ambulatory Visit: Payer: Self-pay | Admitting: Family Medicine

## 2019-09-22 DIAGNOSIS — R1310 Dysphagia, unspecified: Secondary | ICD-10-CM

## 2019-09-22 DIAGNOSIS — R1319 Other dysphagia: Secondary | ICD-10-CM

## 2019-09-24 ENCOUNTER — Encounter (HOSPITAL_COMMUNITY): Payer: Self-pay

## 2019-09-24 ENCOUNTER — Telehealth (HOSPITAL_COMMUNITY): Payer: Self-pay | Admitting: Occupational Therapy

## 2019-09-24 ENCOUNTER — Ambulatory Visit (HOSPITAL_COMMUNITY): Payer: Medicare HMO | Admitting: Occupational Therapy

## 2019-09-24 DIAGNOSIS — E039 Hypothyroidism, unspecified: Secondary | ICD-10-CM | POA: Diagnosis not present

## 2019-09-24 LAB — TSH: TSH: 1.96 mIU/L (ref 0.40–4.50)

## 2019-09-24 NOTE — Telephone Encounter (Signed)
pt called to cancel due to she woke up sick this morning.

## 2019-09-25 ENCOUNTER — Ambulatory Visit: Payer: Medicare Other | Admitting: Specialist

## 2019-09-26 ENCOUNTER — Ambulatory Visit (INDEPENDENT_AMBULATORY_CARE_PROVIDER_SITE_OTHER): Payer: Medicare HMO | Admitting: *Deleted

## 2019-09-26 ENCOUNTER — Other Ambulatory Visit: Payer: Self-pay | Admitting: Family Medicine

## 2019-09-26 DIAGNOSIS — I5022 Chronic systolic (congestive) heart failure: Secondary | ICD-10-CM | POA: Diagnosis not present

## 2019-09-27 LAB — CUP PACEART REMOTE DEVICE CHECK
Battery Remaining Longevity: 13 mo
Battery Voltage: 2.88 V
Brady Statistic AP VP Percent: 12.79 %
Brady Statistic AP VS Percent: 0.03 %
Brady Statistic AS VP Percent: 84.37 %
Brady Statistic AS VS Percent: 2.8 %
Brady Statistic RA Percent Paced: 12.08 %
Brady Statistic RV Percent Paced: 90.71 %
Date Time Interrogation Session: 20210312134656
HighPow Impedance: 67 Ohm
Implantable Lead Implant Date: 20150417
Implantable Lead Implant Date: 20150417
Implantable Lead Implant Date: 20150417
Implantable Lead Location: 753858
Implantable Lead Location: 753859
Implantable Lead Location: 753860
Implantable Lead Model: 4298
Implantable Lead Model: 5076
Implantable Lead Model: 6935
Implantable Pulse Generator Implant Date: 20150417
Lead Channel Impedance Value: 285 Ohm
Lead Channel Impedance Value: 285 Ohm
Lead Channel Impedance Value: 285 Ohm
Lead Channel Impedance Value: 304 Ohm
Lead Channel Impedance Value: 342 Ohm
Lead Channel Impedance Value: 342 Ohm
Lead Channel Impedance Value: 342 Ohm
Lead Channel Impedance Value: 456 Ohm
Lead Channel Impedance Value: 475 Ohm
Lead Channel Impedance Value: 475 Ohm
Lead Channel Impedance Value: 513 Ohm
Lead Channel Impedance Value: 532 Ohm
Lead Channel Impedance Value: 532 Ohm
Lead Channel Pacing Threshold Amplitude: 0.625 V
Lead Channel Pacing Threshold Amplitude: 0.625 V
Lead Channel Pacing Threshold Amplitude: 2.375 V
Lead Channel Pacing Threshold Pulse Width: 0.4 ms
Lead Channel Pacing Threshold Pulse Width: 0.4 ms
Lead Channel Pacing Threshold Pulse Width: 0.4 ms
Lead Channel Sensing Intrinsic Amplitude: 1.5 mV
Lead Channel Sensing Intrinsic Amplitude: 1.5 mV
Lead Channel Sensing Intrinsic Amplitude: 10.5 mV
Lead Channel Sensing Intrinsic Amplitude: 10.5 mV
Lead Channel Setting Pacing Amplitude: 2 V
Lead Channel Setting Pacing Amplitude: 2.5 V
Lead Channel Setting Pacing Amplitude: 3.5 V
Lead Channel Setting Pacing Pulse Width: 0.4 ms
Lead Channel Setting Pacing Pulse Width: 0.4 ms
Lead Channel Setting Sensing Sensitivity: 0.3 mV

## 2019-10-02 ENCOUNTER — Telehealth: Payer: Self-pay

## 2019-10-02 NOTE — Telephone Encounter (Signed)
Pt scheduled an apt on 10/07/19 @ 8:00 AM. Pt states the swallowing issues were something pt could handle but it's getting worse. The chocking feeling is there all the time without eating. Pt is taking Pantoprazole 40 mg bid before meals. Pt's airway isn't blocked. Pt is aware if her airway was blocked, she would need to call EMS or go to the ED. Pt states if she doesn't need to come in the office on 10/07/19, she can cancel apt.

## 2019-10-07 ENCOUNTER — Encounter: Payer: Self-pay | Admitting: Gastroenterology

## 2019-10-07 ENCOUNTER — Ambulatory Visit: Payer: Medicare HMO | Admitting: Gastroenterology

## 2019-10-07 ENCOUNTER — Other Ambulatory Visit: Payer: Self-pay

## 2019-10-07 VITALS — BP 134/72 | HR 64 | Temp 96.9°F | Ht 67.0 in | Wt 244.6 lb

## 2019-10-07 DIAGNOSIS — K219 Gastro-esophageal reflux disease without esophagitis: Secondary | ICD-10-CM | POA: Diagnosis not present

## 2019-10-07 DIAGNOSIS — R1310 Dysphagia, unspecified: Secondary | ICD-10-CM | POA: Diagnosis not present

## 2019-10-07 DIAGNOSIS — R1319 Other dysphagia: Secondary | ICD-10-CM

## 2019-10-07 MED ORDER — LANSOPRAZOLE 30 MG PO CPDR: 30.0000 mg | DELAYED_RELEASE_CAPSULE | Freq: Every day | ORAL | 3 refills | Status: DC

## 2019-10-07 NOTE — Progress Notes (Signed)
Primary Care Physician: [name], MD  Primary Gastroenterologist:  Garfield Cornea, MD   Chief Complaint  Patient presents with   Dysphagia    HPI: [name] is a 79 y.o. female here for follow-up.  She was last seen in August 2020.  She had a EGD back in June for dysphagia.  Esophagus was normal, status post empiric dilation, small hiatal hernia, erythematous mucosa in the stomach but not biopsy.  Barium esophagram July 2020 for dysphagia and choking on solids and liquids showed failure to propagate any primary peristaltic waves.  Extensive tertiary contractions.  Pill passed without difficulty, structurally esophagus appeared normal.  I last saw her on August 2020, she felt like esophageal dilation helped some but not significantly.  Complained of every time she ate or drank she felt like stuff become lodged in the upper esophagus.  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  She did not want to pursue esophageal manometry at the time.  Pantoprazole 40 mg twice daily.  We tried peppermint oil in the way of deltoid mints to see if this would help relax smooth muscles of the esophagus.  She felt like Mints helped some but not able to really elaborate on what symptom it helped. She wants to go back on prevacid stating she seems to feel better on it rather than pantoprazole. While eating seems okay. After eating feels then feels full in chest, then has to cough up phlegm. Happens even with water. While not eating feels like someone is choking her neck. Symptoms every day. Sometimes so aggravating that she feels like she can't deal with it. BM regular on Miralax. No melena, brbpr. If vomits, feels better.  She is apprehensive in having esophageal manometry because she states she is not a candidate for surgery per Dr. Lovena Le. If she had achalasia she does not feel she could undergo surgery or EGD with botox. She is not sure about trying diltiazem or amitriptyline.      Current  Outpatient Medications  Medication Sig Dispense Refill   benazepril (LOTENSIN) 40 MG tablet TAKE 1 TABLET BY MOUTH IN THE MORNING 90 tablet 0   carvedilol (COREG) 25 MG tablet TAKE 1 TABLET BY MOUTH TWICE DAILY WITH A MEAL 60 tablet 3   cholecalciferol (VITAMIN D) 1000 UNITS tablet Take 2,000 Units by mouth.      fexofenadine (ALLEGRA) 180 MG tablet Take 180 mg by mouth every morning.      fluticasone (FLONASE) 50 MCG/ACT nasal spray Place 2 sprays into both nostrils daily as needed for allergies. 16 g 0   furosemide (LASIX) 40 MG tablet TAKE 1 TABLET BY MOUTH TWICE DAILY AS DIRECTED 60 tablet 3   hydrALAZINE (APRESOLINE) 25 MG tablet Take 25 mg by mouth 3 (three) times daily.     ondansetron (ZOFRAN) 4 MG tablet Take 4 mg by mouth every 8 (eight) hours as needed for nausea or vomiting.     pantoprazole (PROTONIX) 40 MG tablet Take 1 tablet by mouth twice daily 60 tablet 0   SYNTHROID 125 MCG tablet Take 1 tablet by mouth once daily 30 tablet 0   traMADol (ULTRAM) 50 MG tablet Take 2 tablets (100 mg total) by mouth every 6 (six) hours as needed for up to 7 days for moderate pain. (Patient taking differently: Take 50 mg by mouth every 6 (six) hours as needed for moderate pain. ) 40 tablet 0   XARELTO 20 MG TABS tablet TAKE 1  TABLET BY MOUTH ONCE DAILY WITH SUPPER 30 tablet 11   No current facility-administered medications for this visit.    Allergies as of 10/07/2019 - Review Complete 10/07/2019  Allergen Reaction Noted   Ciprofloxacin Cough 10/17/2013   Dexlansoprazole  09/13/2011   Statins Other (See Comments) 05/30/2012   Amoxicillin Rash 07/14/2019    ROS:  General: Negative for anorexia, weight loss, fever, chills, fatigue, weakness. ENT: Negative for hoarseness, nasal congestion. See hpi CV: Negative for chest pain, angina, palpitations, dyspnea on exertion, peripheral edema.  Respiratory: Negative for dyspnea at rest, dyspnea on exertion, cough, sputum, wheezing.    GI: See history of present illness. GU:  Negative for dysuria, hematuria, urinary incontinence, urinary frequency, nocturnal urination.  Endo: Negative for unusual weight change.    Physical Examination:   BP 134/72    Pulse 64    Temp (!) 96.9 F (36.1 C) (Temporal)    Ht 5\' 7"  (1.702 m)    Wt 244 lb 9.6 oz (110.9 kg)    BMI 38.31 kg/m   General: Well-nourished, well-developed in no acute distress.  Eyes: No icterus. Mouth: masked Lungs: Clear to auscultation bilaterally.  Heart: Regular rate and rhythm, no murmurs rubs or gallops.  Abdomen: Bowel sounds are normal, nontender, nondistended, no hepatosplenomegaly or masses, no abdominal bruits or hernia , no rebound or guarding.   Extremities: No lower extremity edema. No clubbing or deformities. Neuro: Alert and oriented x 4   Skin: Warm and dry, no jaundice.   Psych: Alert and cooperative, normal mood and affect.  Labs:  Lab Results  Component Value Date   CREATININE 1.17 (H) 07/21/2019   BUN 17 07/21/2019   NA 139 07/21/2019   K 4.0 07/21/2019   CL 100 07/21/2019   CO2 31 07/21/2019   Lab Results  Component Value Date   ALT 10 07/21/2019   AST 13 07/21/2019   ALKPHOS 55 01/29/2013   BILITOT 0.6 07/21/2019   Lab Results  Component Value Date   TSH 1.96 09/24/2019     Imaging Studies: CUP PACEART REMOTE DEVICE CHECK  Result Date: 09/27/2019 Scheduled CRTD remote.  Presenting PVCs.  VP 90.7%.  Estimated longevity 13 months.  TI at reference.  PVC burden 120.9 per hour.  V sensing episodes.  Histrograms show intrinsic V rates. SChancey  XR Shoulder Left  Result Date: 09/15/2019 AP axillary lateral and thoracic outlet view shows left shoulder with a anterior inferior acromion process spur, SAS is well maintained. The Axillary lateral view with moderate narrowing of the G-H joint with an anterior glenoid spur. The left A-C joint is moderately narrowed. Findings of mild osteoarthritis left shoulder.

## 2019-10-07 NOTE — Patient Instructions (Signed)
1. Stop pantoprazole. Start prevacid. RX sent to your pharmacy.  2. I will discuss your swallowing issues with Dr. Gala Romney. You may benefit from seeing speech therapist for further testing of your swallowing versus trying medications for esophagus like diltiazem or amitriptyline. Further recommendations to follow.

## 2019-10-09 NOTE — Assessment & Plan Note (Signed)
Persistent dysphagia s/p esophageal dilation in 12/2018. Esophagus appeared normal at that time. Barium esophagram in 01/2019 showed failure to propagate any primary peristaltic waves, extensive tertiary contractions, pill passed without difficulty.  Structurally the esophagus appeared normal.  Suspect some underlying esophageal dysmotility.  Offered esophageal manometry for further diagnostic purposes, patient currently declines as she does not feel like she would not be a surgical candidate.  She tolerated EGD last year but again is concerned that she would not be a candidate even for endoscopic Botox injections if she were to have achalasia.  We discussed possibility of using diltiazem or amitriptyline for possible esophageal spasms but she is very apprehensive because of her significant cardiac disease.  She request switching PPI therapy back to Prevacid 30 mg daily, Rx has been sent to her pharmacy.  I advised her that I would discuss further with Dr. Emerson Monte to get his opinion regarding possible medication regimens versus possible speech therapy evaluation to rule out oropharyngeal component contributing to her symptoms.  Further recommendations to follow.

## 2019-10-10 NOTE — Progress Notes (Signed)
CC'ED TO PCP 

## 2019-10-13 NOTE — Telephone Encounter (Signed)
See ov note. 

## 2019-10-14 ENCOUNTER — Telehealth: Payer: Self-pay | Admitting: Gastroenterology

## 2019-10-14 DIAGNOSIS — R1319 Other dysphagia: Secondary | ICD-10-CM

## 2019-10-14 DIAGNOSIS — R1310 Dysphagia, unspecified: Secondary | ICD-10-CM

## 2019-10-14 NOTE — Telephone Encounter (Signed)
Please let pt know I discussed her case with Dr. Gala Romney. At this time he agreed that esophageal manometry (catheter inserted through nose and down esophagus for few minutes while she swallows to determine if she is having a motility disorder) would be most helpful but since patient is apprehensive we will hold off for now.   He wants to hold off on medications discussed at time of OV ie amitriptyline and cardiazem.   He advised to have her see speech therapist to rule out oropharyngeal component to her swallowing issues. If agreeable, please make referral.

## 2019-10-14 NOTE — Telephone Encounter (Signed)
Called pt and informed her that LSL discussed her case with Dr. Gala Romney.  He agreed that esophageal manometry would be most helpful.  She was made aware that since she is apprehensive that we will hold off for now. She said that she is not ready for it.  Pt advised to hold off on medications discussed at time of OV ie amitriptyline and cardiazem. Pt voiced understanding. Pt was advised that RMR recommends a speech therapist to rule out oropharyngeal component due to her swallowing issues. Pt is agreeable.  Routing to clinical pool.

## 2019-10-14 NOTE — Addendum Note (Signed)
Addended by: Cheron Every on: 10/14/2019 10:55 AM   Modules accepted: Orders

## 2019-10-14 NOTE — Telephone Encounter (Signed)
Referral sent to speech

## 2019-10-15 ENCOUNTER — Other Ambulatory Visit: Payer: Self-pay | Admitting: Emergency Medicine

## 2019-10-15 ENCOUNTER — Other Ambulatory Visit (HOSPITAL_COMMUNITY): Payer: Self-pay | Admitting: Specialist

## 2019-10-15 DIAGNOSIS — R1310 Dysphagia, unspecified: Secondary | ICD-10-CM

## 2019-10-15 DIAGNOSIS — R1319 Other dysphagia: Secondary | ICD-10-CM

## 2019-10-15 MED ORDER — ONDANSETRON HCL 4 MG PO TABS: 4.0000 mg | ORAL_TABLET | Freq: Three times a day (TID) | ORAL | 0 refills | Status: AC | PRN

## 2019-10-17 ENCOUNTER — Other Ambulatory Visit: Payer: Self-pay

## 2019-10-17 ENCOUNTER — Ambulatory Visit (HOSPITAL_COMMUNITY): Payer: Medicare HMO | Attending: Internal Medicine | Admitting: Speech Pathology

## 2019-10-17 ENCOUNTER — Ambulatory Visit (HOSPITAL_COMMUNITY)
Admission: RE | Admit: 2019-10-17 | Discharge: 2019-10-17 | Disposition: A | Discharge status: Home / Self Care | Payer: Medicare HMO | Source: Ambulatory Visit | Attending: Internal Medicine | Admitting: Internal Medicine

## 2019-10-17 ENCOUNTER — Encounter (HOSPITAL_COMMUNITY): Payer: Self-pay | Admitting: Speech Pathology

## 2019-10-17 DIAGNOSIS — G8929 Other chronic pain: Secondary | ICD-10-CM | POA: Diagnosis not present

## 2019-10-17 DIAGNOSIS — R29898 Other symptoms and signs involving the musculoskeletal system: Secondary | ICD-10-CM | POA: Diagnosis not present

## 2019-10-17 DIAGNOSIS — R1319 Other dysphagia: Secondary | ICD-10-CM

## 2019-10-17 DIAGNOSIS — R1310 Dysphagia, unspecified: Secondary | ICD-10-CM | POA: Insufficient documentation

## 2019-10-17 DIAGNOSIS — F458 Other somatoform disorders: Secondary | ICD-10-CM | POA: Diagnosis not present

## 2019-10-17 DIAGNOSIS — M25512 Pain in left shoulder: Secondary | ICD-10-CM | POA: Diagnosis not present

## 2019-10-17 DIAGNOSIS — M25612 Stiffness of left shoulder, not elsewhere classified: Secondary | ICD-10-CM | POA: Diagnosis not present

## 2019-10-17 NOTE — Therapy (Signed)
[place], [name], [address] Phone: [phone]   Fax:  [phone]  Modified Barium Swallow  Patient Details  Name: [name] MRN: [id number] Date of Birth: 1940-12-16 No data recorded  Encounter Date: 10/17/2019  End of Session - 10/17/19 1159    Visit Number  1    Number of Visits  1    SLP Start Time  55    SLP Stop Time   1009    SLP Time Calculation (min)  12 min    Activity Tolerance  Patient tolerated treatment well       HPI: THREASE BATTERSON is a 79 y.o. female with hx significant for esophageal dysphagia and hx of esophageal dilation.  She was last seen in August 2020 before this most recent GI visit on 10/07/2019.  She had a EGD back in June for dysphagia.  Esophagus was normal, status post empiric dilation, small hiatal hernia, erythematous mucosa in the stomach but not biopsy.  Barium esophagram July 2020 for dysphagia and choking on solids and liquids showed failure to propagate any primary peristaltic waves.  Extensive tertiary contractions.  Pill passed without difficulty, structurally esophagus appeared normal.  Pt was advised that RMR recommends a speech therapist to rule out oropharyngeal component due to her swallowing issues. MBS to be completed this date to r/o oropharyngeal component.   Subjective: "I feel like I'm choking all the time, not just when I'm eating"    Assessment / Plan / Recommendation  CHL IP CLINICAL IMPRESSIONS 10/17/2019  Clinical Impression Pt presents with grossly normal oropharyngeal swallowing function. Pt was administered thin (via tsp, cup & straw), puree, regular and barium tablet without difficulty. Pt presents with good hyolaryngeal excursion, adequate pharyngeal squeeze, good laryngeal vestibule closure and consistent protection of her airway without penetration or aspiration of any textures/consistencies assessed. Note trace/mild pharyngeal residue after initial  swallow of all textures/consistencies however second reflexive swallow was consistent and effective in completely clearing residue. Pt demonstrated brief stasis of the barium tablet in the valleculae however with consecutive sips of thin it quickly passed through without incident. Recommend Pt remain on a regular diet with thin liquids and follow universal aspiration precautions which SLP reviewed with Pt. No ST f/u is recommended at this time. Thank you for this referral.   SLP Visit Diagnosis Dysphagia, unspecified (R13.10)  Attention and concentration deficit following --  Frontal lobe and executive function deficit following --  Impact on safety and function Mild aspiration risk      CHL IP TREATMENT RECOMMENDATION 10/17/2019  Treatment Recommendations No treatment recommended at this time     No flowsheet data found.  CHL IP DIET RECOMMENDATION 10/17/2019  SLP Diet Recommendations Regular solids;Thin liquid  Liquid Administration via Cup;Straw  Medication Administration Whole meds with liquid  Compensations Minimize environmental distractions;Slow rate;Small sips/bites;Follow solids with liquid  Postural Changes Remain semi-upright after after feeds/meals (Comment);Seated upright at 90 degrees      CHL IP OTHER RECOMMENDATIONS 10/17/2019  Recommended Consults --  Oral Care Recommendations Oral care BID  Other Recommendations --      CHL IP FOLLOW UP RECOMMENDATIONS 10/17/2019  Follow up Recommendations None      No flowsheet data found.         CHL IP ORAL PHASE 10/17/2019  Oral Phase WFL  Oral - Pudding Teaspoon --  Oral - Pudding Cup --  Oral - Honey Teaspoon --  Oral - Honey  Cup --  Oral - Nectar Teaspoon --  Oral - Nectar Cup --  Oral - Nectar Straw --  Oral - Thin Teaspoon --  Oral - Thin Cup --  Oral - Thin Straw --  Oral - Puree --  Oral - Mech Soft --  Oral - Regular --  Oral - Multi-Consistency --  Oral - Pill --  Oral Phase - Comment --    CHL IP PHARYNGEAL  PHASE 10/17/2019  Pharyngeal Phase WFL  Pharyngeal- Pudding Teaspoon --  Pharyngeal --  Pharyngeal- Pudding Cup --  Pharyngeal --  Pharyngeal- Honey Teaspoon --  Pharyngeal --  Pharyngeal- Honey Cup --  Pharyngeal --  Pharyngeal- Nectar Teaspoon --  Pharyngeal --  Pharyngeal- Nectar Cup --  Pharyngeal --  Pharyngeal- Nectar Straw --  Pharyngeal --  Pharyngeal- Thin Teaspoon --  Pharyngeal --  Pharyngeal- Thin Cup --  Pharyngeal --  Pharyngeal- Thin Straw --  Pharyngeal --  Pharyngeal- Puree --  Pharyngeal --  Pharyngeal- Mechanical Soft --  Pharyngeal --  Pharyngeal- Regular --  Pharyngeal --  Pharyngeal- Multi-consistency --  Pharyngeal --  Pharyngeal- Pill --  Pharyngeal --  Pharyngeal Comment --     CHL IP CERVICAL ESOPHAGEAL PHASE 10/17/2019  Cervical Esophageal Phase WFL  Pudding Teaspoon --  Pudding Cup --  Honey Teaspoon --  Honey Cup --  Nectar Teaspoon --  Nectar Cup --  Nectar Straw --  Thin Teaspoon --  Thin Cup --  Thin Straw --  Puree --  Mechanical Soft --  Regular --  Multi-consistency --  Pill --  Cervical Esophageal Comment --                      Past Medical History:  Diagnosis Date  . A-fib ([place])    on Xarelto  . Allergy   . Anemia   . Blood transfusion without reported diagnosis   . 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 Cataract   . Cerebrovascular accident [facility])    No clinical event; asymptomatic CT finding  . CHF (congestive heart failure) ([place])   . Chronic kidney disease   . COPD (chronic obstructive pulmonary disease) ([place])   . DDD (degenerative disc disease), cervical    Cervical  . Degenerative joint disease    Right hip and knee  . GERD (gastroesophageal reflux disease)   . Hip dislocation, right ([place])   . Hyperlipemia   . Hypertension   . Hypothyroid   . LBBB (left bundle branch block)   .  Neuromuscular disorder ([place])   . S/P colonoscopy 2009   Dr. Sharlett Iles: reportedly normal per pt  . S/P endoscopy 1980s   per pt; normal. performed secondary to reflux    Past Surgical History:  Procedure Laterality Date  . ABDOMINAL HYSTERECTOMY    . APPENDECTOMY    . BI-VENTRICULAR IMPLANTABLE CARDIOVERTER DEFIBRILLATOR  (CRT-D)  10-31-2013   MDT Hillery Aldo CRTD implanted by Dr Lovena Le  . BIOPSY  12/18/2018   Procedure: BIOPSY;  Surgeon: Daneil Dolin, MD;  Location: [facility];  Service: Endoscopy;;  Gastric   . BREAST BIOPSY     bilaterally; benign  . can not have MRI due to jaw surgery    . CHOLECYSTECTOMY    . COLONOSCOPY  2009   Dr. Sharlett Iles: Normal  . ESOPHAGOGASTRODUODENOSCOPY  07/13/2011   small  hh, patchy gastric erythema with scarring deformity of antrum, chronic gastritis, bx benign with no H.Pylori. Procedure: ESOPHAGOGASTRODUODENOSCOPY (EGD);  Surgeon: Daneil Dolin, MD;  Location: [facility];  Service: Endoscopy;  Laterality: N/A;  10:45  . ESOPHAGOGASTRODUODENOSCOPY N/A 12/18/2018   Dr. Gala Romney: normal esophagus s/p dilation, small hh, erythema in stomach with benign biopsy.   . IMPLANTABLE CARDIOVERTER DEFIBRILLATOR IMPLANT N/A 10/31/2013   Procedure: IMPLANTABLE CARDIOVERTER DEFIBRILLATOR IMPLANT;  Surgeon: Evans Lance, MD;  Location: [facility] CATH LAB;  Service: Cardiovascular;  Laterality: N/A;  . Venia Minks DILATION N/A 12/18/2018   Procedure: Venia Minks DILATION;  Surgeon: Daneil Dolin, MD;  Location: [facility];  Service: Endoscopy;  Laterality: N/A;  . MANDIBLE SURGERY     secondary to malocclusion  . THYROIDECTOMY     neoplasm  . Ureteral Surgery  1970    There were no vitals filed for this visit.     Patient will benefit from skilled therapeutic intervention in order to improve the following deficits and impairments:   Esophageal dysphagia   Problem List Patient Active Problem List   Diagnosis Date Noted  . Vitamin D deficiency 07/14/2019  .  Esophageal dysphagia 12/10/2018  . RUQ pain 09/17/2017  . Right flank pain 09/17/2017  . 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 Atrial fibrillation ([place]) 11/17/2013  . Chronic systolic heart failure ([place]) 10/13/2013  . Hyponatremia 03/04/2013  . Constipation 09/13/2011  . GERD 09/13/2010  . Hypothyroidism 04/28/2009  . Hyperlipidemia 04/28/2009  . Hypertension 04/28/2009  . COPD (chronic obstructive pulmonary disease) ([place]) 04/28/2009  . LEFT BUNDLE BRANCH BLOCK 04/28/2009  . DEGENERATIVE JOINT DISEASE 04/28/2009   Kristi Norment H. Roddie Mc, CCC-SLP Speech Language Pathologist  Wende Bushy 10/17/2019, 12:00 PM  [facility] [address] [place], [name], [address] Phone: [phone]   Fax:  [phone]  Name: CHANTRELLE VALCARCEL MRN: [id number] Date of Birth: 01/09/1941

## 2019-10-21 ENCOUNTER — Encounter: Payer: Self-pay | Admitting: Internal Medicine

## 2019-10-21 ENCOUNTER — Encounter (HOSPITAL_COMMUNITY): Payer: Self-pay

## 2019-10-21 ENCOUNTER — Other Ambulatory Visit: Payer: Self-pay

## 2019-10-21 ENCOUNTER — Ambulatory Visit (INDEPENDENT_AMBULATORY_CARE_PROVIDER_SITE_OTHER): Payer: Medicare HMO | Admitting: Internal Medicine

## 2019-10-21 ENCOUNTER — Ambulatory Visit (HOSPITAL_COMMUNITY): Payer: Medicare HMO

## 2019-10-21 VITALS — BP 150/84 | HR 64 | Temp 98.3°F | Ht 67.0 in | Wt 242.0 lb

## 2019-10-21 DIAGNOSIS — R600 Localized edema: Secondary | ICD-10-CM

## 2019-10-21 DIAGNOSIS — G8929 Other chronic pain: Secondary | ICD-10-CM | POA: Diagnosis not present

## 2019-10-21 DIAGNOSIS — M25512 Pain in left shoulder: Secondary | ICD-10-CM | POA: Diagnosis not present

## 2019-10-21 DIAGNOSIS — M25612 Stiffness of left shoulder, not elsewhere classified: Secondary | ICD-10-CM | POA: Diagnosis not present

## 2019-10-21 DIAGNOSIS — I5022 Chronic systolic (congestive) heart failure: Secondary | ICD-10-CM | POA: Diagnosis not present

## 2019-10-21 DIAGNOSIS — J449 Chronic obstructive pulmonary disease, unspecified: Secondary | ICD-10-CM | POA: Diagnosis not present

## 2019-10-21 DIAGNOSIS — R29898 Other symptoms and signs involving the musculoskeletal system: Secondary | ICD-10-CM

## 2019-10-21 DIAGNOSIS — R1310 Dysphagia, unspecified: Secondary | ICD-10-CM | POA: Diagnosis not present

## 2019-10-21 LAB — CUP PACEART INCLINIC DEVICE CHECK
Brady Statistic AP VP Percent: 9.1 %
Brady Statistic AP VS Percent: 0.1 % — CL
Brady Statistic AS VP Percent: 88.6 %
Brady Statistic AS VS Percent: 2.2 %
Date Time Interrogation Session: 20210406140820
Implantable Lead Implant Date: 20150417
Implantable Lead Implant Date: 20150417
Implantable Lead Implant Date: 20150417
Implantable Lead Location: 753858
Implantable Lead Location: 753859
Implantable Lead Location: 753860
Implantable Lead Model: 4298
Implantable Lead Model: 5076
Implantable Lead Model: 6935
Implantable Pulse Generator Implant Date: 20150417
Lead Channel Pacing Threshold Amplitude: 0.75 V
Lead Channel Pacing Threshold Amplitude: 1 V
Lead Channel Pacing Threshold Amplitude: 1.75 V
Lead Channel Pacing Threshold Pulse Width: 0.08 ms
Lead Channel Pacing Threshold Pulse Width: 0.4 ms
Lead Channel Pacing Threshold Pulse Width: 0.4 ms
Lead Channel Sensing Intrinsic Amplitude: 1.6 mV
Lead Channel Sensing Intrinsic Amplitude: 9.9 mV

## 2019-10-21 NOTE — Progress Notes (Signed)
HPI [name] returns today for followup of chronic systolic heart failure, LBBB, s/p ICD insertion. In the interim, she has had class 2-3 symptoms. She is limited by her weight and CHF. She has not had syncope and denies any ICD therapies. Her weight is up 8 lbs in the last 48months. She has chronic lymphedema. She is walking but gets tired quickly and has to stop and rest. Allergies  Allergen Reactions  . Ciprofloxacin Cough  . Dexlansoprazole     Lower abdominal pain.  . Statins Other (See Comments)    Arthralgias during treatment with at least 2 members of his class   . Amoxicillin Rash     Current Outpatient Medications  Medication Sig Dispense Refill  . benazepril (LOTENSIN) 40 MG tablet TAKE 1 TABLET BY MOUTH IN THE MORNING 90 tablet 0  . carvedilol (COREG) 25 MG tablet TAKE 1 TABLET BY MOUTH TWICE DAILY WITH A MEAL 60 tablet 3  . cholecalciferol (VITAMIN D) 1000 UNITS tablet Take 2,000 Units by mouth.     . fexofenadine (ALLEGRA) 180 MG tablet Take 180 mg by mouth every morning.     . fluticasone (FLONASE) 50 MCG/ACT nasal spray Place 2 sprays into both nostrils daily as needed for allergies. 16 g 0  . furosemide (LASIX) 40 MG tablet TAKE 1 TABLET BY MOUTH TWICE DAILY AS DIRECTED 60 tablet 3  . hydrALAZINE (APRESOLINE) 25 MG tablet Take 25 mg by mouth 3 (three) times daily.    . lansoprazole (PREVACID) 30 MG capsule Take 1 capsule (30 mg total) by mouth daily before breakfast. 90 capsule 3  . ondansetron (ZOFRAN) 4 MG tablet Take 1 tablet (4 mg total) by mouth every 8 (eight) hours as needed for nausea or vomiting. 20 tablet 0  . SYNTHROID 125 MCG tablet Take 1 tablet by mouth once daily 30 tablet 0  . XARELTO 20 MG TABS tablet TAKE 1 TABLET BY MOUTH ONCE DAILY WITH SUPPER 30 tablet 11  . traMADol (ULTRAM) 50 MG tablet Take 2 tablets (100 mg total) by mouth every 6 (six) hours as needed for up to 7 days for moderate pain. (Patient taking differently: Take 50 mg by mouth  every 6 (six) hours as needed for moderate pain. ) 40 tablet 0   No current facility-administered medications for this visit.     Past Medical History:  Diagnosis Date  . A-fib ([place])    on Xarelto  . Allergy   . Anemia   . Blood transfusion without reported diagnosis   . 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 Cataract   . Cerebrovascular accident [facility])    No clinical event; asymptomatic CT finding  . CHF (congestive heart failure) ([place])   . Chronic kidney disease   . COPD (chronic obstructive pulmonary disease) ([place])   . DDD (degenerative disc disease), cervical    Cervical  . Degenerative joint disease    Right hip and knee  . GERD (gastroesophageal reflux disease)   . Hip dislocation, right ([place])   . Hyperlipemia   . Hypertension   . Hypothyroid   . LBBB (left bundle branch block)   . Neuromuscular disorder ([place])   . S/P colonoscopy 2009   Dr. Sharlett Iles: reportedly normal per pt  . S/P endoscopy 1980s   per pt; normal. performed secondary to reflux    ROS:  All systems reviewed and negative except as noted in the HPI.   Past Surgical History:  Procedure Laterality Date  . ABDOMINAL HYSTERECTOMY    . APPENDECTOMY    . BI-VENTRICULAR IMPLANTABLE CARDIOVERTER DEFIBRILLATOR  (CRT-D)  10-31-2013   MDT Hillery Aldo CRTD implanted by Dr Lovena Le  . BIOPSY  12/18/2018   Procedure: BIOPSY;  Surgeon: Daneil Dolin, MD;  [facility];  Service: Endoscopy;;  Gastric   . BREAST BIOPSY     bilaterally; benign  . can not have MRI due to jaw surgery    . CHOLECYSTECTOMY    . COLONOSCOPY  2009   Dr. Sharlett Iles: Normal  . ESOPHAGOGASTRODUODENOSCOPY  07/13/2011   small hh, patchy gastric erythema with scarring deformity of antrum, chronic gastritis, bx benign with no H.Pylori. Procedure: ESOPHAGOGASTRODUODENOSCOPY (EGD);  Surgeon: Daneil Dolin, MD;  [facility]  [facility];  Service: Endoscopy;  Laterality: N/A;  10:45  . ESOPHAGOGASTRODUODENOSCOPY N/A 12/18/2018   Dr. Gala Romney: normal esophagus s/p dilation, small hh, erythema in stomach with benign biopsy.   . IMPLANTABLE CARDIOVERTER DEFIBRILLATOR IMPLANT N/A 10/31/2013   Procedure: IMPLANTABLE CARDIOVERTER DEFIBRILLATOR IMPLANT;  Surgeon: Evans Lance, MD;  Location: [facility] CATH LAB;  Service: Cardiovascular;  Laterality: N/A;  . Venia Minks DILATION N/A 12/18/2018   Procedure: Venia Minks DILATION;  Surgeon: Daneil Dolin, MD;  [facility];  Service: Endoscopy;  Laterality: N/A;  . MANDIBLE SURGERY     secondary to malocclusion  . THYROIDECTOMY     neoplasm  . Ureteral Surgery  1970     Family History  Problem Relation Age of Onset  . Brain cancer Mother   . Heart disease Mother   . Hypertension Mother   . Stroke Mother   . Atrial fibrillation Other   . Stroke Brother   . Colon cancer Neg Hx      Social History   Socioeconomic History  . Marital status: Widowed    Spouse name: Not on file  . Number of children: 2  . Years of education: Not on file  . Highest education level: Not on file  Occupational History  . Occupation: retired  Tobacco Use  . Smoking status: Never Smoker  . Smokeless tobacco: Never Used  Substance and Sexual Activity  . Alcohol use: No    Alcohol/week: 0.0 standard drinks  . Drug use: No  . Sexual activity: Never    Partners: Male  Other Topics Concern  . Not on file  Social History Narrative   Widowed         Social Determinants of Health   Financial Resource Strain:   . Difficulty of Paying Living Expenses:   Food Insecurity:   . Worried About Charity fundraiser in the Last Year:   . Arboriculturist in the Last Year:   Transportation Needs:   . Film/video editor (Medical):   Marland Kitchen Lack of Transportation (Non-Medical):   Physical Activity:   . Days of Exercise per Week:   . Minutes of Exercise per Session:   Stress:   . Feeling of  Stress :   Social Connections:   . Frequency of Communication with Friends and Family:   . Frequency of Social Gatherings with Friends and Family:   . Attends Religious Services:   . Active Member of Clubs or Organizations:   . Attends Archivist Meetings:   Marland Kitchen Marital Status:   Intimate Partner Violence:   . Fear of  Current or Ex-Partner:   . Emotionally Abused:   Marland Kitchen Physically Abused:   . Sexually Abused:      BP (!) 150/84   Pulse 64   Temp 98.3 F (36.8 C)   Ht 5\' 7"  (1.702 m)   SpO2 94%   BMI 38.31 kg/m   Physical Exam:  Well appearing NAD HEENT: Unremarkable Neck:  No JVD, no thyromegally Lymphatics:  No adenopathy Back:  No CVA tenderness Lungs:  Clear HEART:  Regular rate rhythm, no murmurs, no rubs, no clicks Abd:  soft, positive bowel sounds, no organomegally, no rebound, no guarding Ext:  2 plus pulses, no edema, no cyanosis, no clubbing Skin:  No rashes no nodules Neuro:  CN II through XII intact, motor grossly intact   DEVICE  Normal device function.  See PaceArt for details.   Assess/Plan: 1. Chronic systolic heart failure - her symptoms are class 2B. She is encouraged to maintain a low sodium diet. She will continue her CHF meds. 2. Obesity - I encouraged her to lose weight.  3. ICD - she has about a year left on her device.  4. HTN - her sbp is up a bit. She did not take her meds this pm.   Mikle [email or web address].D.

## 2019-10-21 NOTE — Patient Instructions (Signed)
   HOT PACK  Apply a hot pack to the affected area. Apply for 10-15 minutes before exercises and as needed for pain.  Try to complete the following exercises 2 times a day.   1) SHOULDER: Flexion On Table   Place hands on towel placed on table, elbows straight. Lean forward with you upper body, pushing towel away from body.  _10-15__ reps per set, ___ sets per day  2) Abduction (Passive)   With arm out to side, resting on towel placed on table with palm DOWN, keeping trunk away from table, lean to the side while pushing towel away from body.  Repeat __10-15__ times. Do __1-2__ sessions per day.  Copyright  VHI. All rights reserved.     3) Internal Rotation (Assistive)   Seated with elbow bent at right angle and held against side, slide arm on table surface in an inward arc keeping elbow anchored in place. Repeat _10-15___ times. Do 1-2__ sessions per day. Activity: Use this motion to brush crumbs off the table.    1) AROM: Lateral Neck Flexion   Slowly tilt head toward one shoulder, then the other. Hold each position __2__ seconds. Repeat __5__ times per set. Do __1__ sets per session.   http://orth.exer.us/296   Copyright  VHI. All rights reserved.  2) AROM: Neck Extension   Bend head backward. Hold _2___ seconds. Repeat __5__ times per set. Do __1__ sets per session.    Copyright  VHI. All rights reserved.  4) AROM: Neck Rotation   Turn head slowly to look over one shoulder, then the other. Hold each position _2___ seconds. Repeat ___5_ times per set. Do __1__ sets per session.  http://orth.exer.us/294   Copyright  VHI. All rights reserved.

## 2019-10-21 NOTE — Patient Instructions (Signed)
Medication Instructions:  Your physician recommends that you continue on your current medications as directed. Please refer to the Current Medication list given to you today.  *If you need a refill on your cardiac medications before your next appointment, please call your pharmacy*   Lab Work: NONE  If you have labs (blood work) drawn today and your tests are completely normal, you will receive your results only by: Marland Kitchen MyChart Message (if you have MyChart) OR . A paper copy in the mail If you have any lab test that is abnormal or we need to change your treatment, we will call you to review the results.   Testing/Procedures: NONE    Follow-Up: At [facility], you and your health needs are our priority.  As part of our continuing mission to provide you with exceptional heart care, we have created designated Provider Care Teams.  These Care Teams include your primary Cardiologist (physician) and Advanced Practice Providers (APPs -  Physician Assistants and Nurse Practitioners) who all work together to provide you with the care you need, when you need it.  We recommend signing up for the patient portal called "MyChart".  Sign up information is provided on this After Visit Summary.  MyChart is used to connect with patients for Virtual Visits (Telemedicine).  Patients are able to view lab/test results, encounter notes, upcoming appointments, etc.  Non-urgent messages can be sent to your provider as well.   To learn more about what you can do with MyChart, go to [email or web address].    Your next appointment:   11 month(s)  The format for your next appointment:   In Person  Provider:   Cristopher Peru, MD   Other Instructions Thank you for choosing [facility]!

## 2019-10-21 NOTE — Therapy (Signed)
[name], [name], [address] Phone: [phone]   Fax:  [phone]  Occupational Therapy Evaluation  Patient Details  Name: [name] MRN: [id number] Date of Birth: February 18, 1941 Referring Provider (OT): Basil Dess, MD   Encounter Date: 10/21/2019  OT End of Session - 10/21/19 1615    Visit Number  1    Number of Visits  1    Authorization Type  [employer]    Authorization Time Period  copay $40 No visit limit. Authorization requested.    OT Start Time  1430    OT Stop Time  1508    OT Time Calculation (min)  38 min    Activity Tolerance  Patient tolerated treatment well;Patient limited by pain    Behavior During Therapy  WFL for tasks assessed/performed       Past Medical History:  Diagnosis Date  . A-fib ([place])    on Xarelto  . Allergy   . Anemia   . Blood transfusion without reported diagnosis   . 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 Cataract   . Cerebrovascular accident [facility])    No clinical event; asymptomatic CT finding  . CHF (congestive heart failure) ([place])   . Chronic kidney disease   . COPD (chronic obstructive pulmonary disease) ([place])   . DDD (degenerative disc disease), cervical    Cervical  . Degenerative joint disease    Right hip and knee  . GERD (gastroesophageal reflux disease)   . Hip dislocation, right ([place])   . Hyperlipemia   . Hypertension   . Hypothyroid   . LBBB (left bundle branch block)   . Neuromuscular disorder ([place])   . S/P colonoscopy 2009   Dr. Sharlett Iles: reportedly normal per pt  . S/P endoscopy 1980s   per pt; normal. performed secondary to reflux    Past Surgical History:  Procedure Laterality Date  . ABDOMINAL HYSTERECTOMY    . APPENDECTOMY    . BI-VENTRICULAR IMPLANTABLE CARDIOVERTER DEFIBRILLATOR  (CRT-D)  10-31-2013   MDT Hillery Aldo CRTD  implanted by Dr Lovena Le  . BIOPSY  12/18/2018   Procedure: BIOPSY;  Surgeon: Daneil Dolin, MD;  [facility];  Service: Endoscopy;;  Gastric   . BREAST BIOPSY     bilaterally; benign  . can not have MRI due to jaw surgery    . CHOLECYSTECTOMY    . COLONOSCOPY  2009   Dr. Sharlett Iles: Normal  . ESOPHAGOGASTRODUODENOSCOPY  07/13/2011   small hh, patchy gastric erythema with scarring deformity of antrum, chronic gastritis, bx benign with no H.Pylori. Procedure: ESOPHAGOGASTRODUODENOSCOPY (EGD);  Surgeon: Daneil Dolin, MD;  [facility];  Service: Endoscopy;  Laterality: N/A;  10:45  . ESOPHAGOGASTRODUODENOSCOPY N/A 12/18/2018   Dr. Gala Romney: normal esophagus s/p dilation, small hh, erythema in stomach with benign biopsy.   . IMPLANTABLE CARDIOVERTER DEFIBRILLATOR IMPLANT N/A 10/31/2013   Procedure: IMPLANTABLE CARDIOVERTER DEFIBRILLATOR IMPLANT;  Surgeon: Evans Lance, MD;  Location: [facility] CATH LAB;  Service: Cardiovascular;  Laterality: N/A;  . Venia Minks DILATION N/A 12/18/2018   Procedure: Venia Minks DILATION;  Surgeon: Daneil Dolin, MD;  [facility];  Service: Endoscopy;  Laterality: N/A;  . MANDIBLE SURGERY     secondary to malocclusion  . THYROIDECTOMY     neoplasm  . Ureteral Surgery  1970    There  were no vitals filed for this visit.  Subjective Assessment - 10/21/19 1439    Subjective   S: It just feels really stiff in my neck.    Pertinent History  Patient is a 79 y/o female S/P chronic left shoulder pain caused from mild OA, bicipital tendonitis, subacromal bursitis, possible impingement syndrome, and bone spurs. patient reports an ongoing issue for approximately 2-3 years. She has a history of receiving PT services for current condition in the past with some momentary improvement. Dr. Louanne Skye has referred patient to occupational therapy for evaluation and treatment.    Patient Stated Goals  To decrease her neck tightness.    Currently in Pain?  No/denies         [facility] OT Assessment - 10/21/19 1440      Assessment   Medical Diagnosis  Left Chronic shoulder pain    Referring Provider (OT)  Basil Dess, MD    Onset Date/Surgical Date  --   ongoing 2-3 years   Hand Dominance  Right    Prior Therapy  Pt received PT services [facility] approximately 2 years ago when issue initially started.       Precautions   Precautions  ICD/Pacemaker      Restrictions   Weight Bearing Restrictions  No      Balance Screen   Has the patient fallen in the past 6 months  No      Home  Environment   Family/patient expects to be discharged to:  Private residence      Prior Function   Level of Independence  Requires assistive device for independence    Vocation  Retired      ADL   ADL comments  At baseline, patient has limited use of her LUE for reaching and lifting tasks. patient reports that she doesn't do much at home. Her cardiac issues prevent her from completing daily tasks. Unable to determine a specific task her left shoulder is preventing her from doing or having difficulty doing.       Mobility   Mobility Status  Independent      Written Expression   Dominant Hand  Right      Vision - History   Baseline Vision  Wears glasses all the time      Cognition   Overall Cognitive Status  Within Functional Limits for tasks assessed      Observation/Other Assessments   Focus on Therapeutic Outcomes (FOTO)   N/A      ROM / Strength   AROM / PROM / Strength  AROM;PROM;Strength      Palpation   Palpation comment  max fascial restrictions noted in the left upper arm, trapezius, and scapularis region.       AROM   Overall AROM Comments  Assessed seated. IR/er adducted    AROM Assessment Site  Shoulder    Right/Left Shoulder  Left    Left Shoulder Flexion  115 Degrees    Left Shoulder ABduction  87 Degrees    Left Shoulder Internal Rotation  90 Degrees    Left Shoulder External Rotation  60 Degrees      PROM   PROM Assessment Site   Shoulder    Right/Left Shoulder  Left    Left Shoulder Flexion  141 Degrees    Left Shoulder ABduction  135 Degrees    Left Shoulder Internal Rotation  90 Degrees    Left Shoulder External Rotation  36 Degrees  Strength   Overall Strength  Unable to assess;Due to pain               OT Treatments/Exercises (OP) - 10/21/19 1616      Manual Therapy   Manual Therapy  Myofascial release    Manual therapy comments  manual techniques completed prior to exercises    Myofascial Release  Myofascial release completed to left upper trapezius and scapularis region to decrease fascial restrictions and pain level when completing basic daily tasks.             OT Education - 10/21/19 1614    Education Details  Table slides, cervical A/ROM, Massage therapist - local, hot pack use for pain management.    Person(s) Educated  Patient    Methods  Explanation;Demonstration;Verbal cues;Handout    Comprehension  Verbalized understanding       OT Short Term Goals - 10/21/19 1621      OT SHORT TERM GOAL #1   Title  Patient will be educated and verbalize understanding of HEP for LUE in order to decrease muscle tightness and increase overall comfort level while completing basic daily tasks.    Time  1    Period  Weeks    Status  New    Target Date  10/21/19               Plan - 10/21/19 1618    Clinical Impression Statement  A: Patient is a 79 y/o female S/P chronic left shoulder pain which is causing increased fascial restrictions, pain and decreased ROM and strength resulting in difficulty completing basic reaching and self care tasks. Patient reports that her ROM is at her baseline. She is a low level of tolerance for any manual techniques completed directly to her shoulder. She is interested in mainly soft tissue work and was provided with a list of local massage therapists. Provided HEP including gentle table slides and cervical ROM. Pt is interested in completing HEP at  home and will follow up with MD if she feels she is unable to gain any results on her own.    OT Occupational Profile and History  Problem Focused Assessment - Including review of records relating to presenting problem    Occupational performance deficits (Please refer to evaluation for details):  ADL's;IADL's    Body Structure / Function / Physical Skills  UE functional use;Pain;ADL;Strength;ROM;Fascial restriction    Rehab Potential  Good    Clinical Decision Making  Several treatment options, min-mod task modification necessary    Comorbidities Affecting Occupational Performance:  Presence of comorbidities impacting occupational performance    Comorbidities impacting occupational performance description:  cardiac issues, chronic shoulder issues.    Modification or Assistance to Complete Evaluation   Min-Moderate modification of tasks or assist with assess necessary to complete eval    OT Frequency  One time visit    OT Treatment/Interventions  Patient/family education    Plan  P: 1 time visit for HEP and massage therapy resources.    Consulted and Agree with Plan of Care  Patient       Patient will benefit from skilled therapeutic intervention in order to improve the following deficits and impairments:   Body Structure / Function / Physical Skills: UE functional use, Pain, ADL, Strength, ROM, Fascial restriction       Visit Diagnosis: Other symptoms and signs involving the musculoskeletal system - Plan: Ot plan of care cert/re-cert  Chronic left shoulder pain - Plan: Ot plan  of care cert/re-cert  Stiffness of left shoulder, not elsewhere classified - Plan: Ot plan of care cert/re-cert    Problem List Patient Active Problem List   Diagnosis Date Noted  . Vitamin D deficiency 07/14/2019  . Esophageal dysphagia 12/10/2018  . RUQ pain 09/17/2017  . Right flank pain 09/17/2017  . 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 Atrial fibrillation  ([place]) 11/17/2013  . Chronic systolic heart failure ([place]) 10/13/2013  . Hyponatremia 03/04/2013  . Constipation 09/13/2011  . GERD 09/13/2010  . Hypothyroidism 04/28/2009  . Hyperlipidemia 04/28/2009  . Hypertension 04/28/2009  . COPD (chronic obstructive pulmonary disease) ([place]) 04/28/2009  . LEFT BUNDLE BRANCH BLOCK 04/28/2009  . DEGENERATIVE JOINT DISEASE 04/28/2009    [name], OTR/L,CBIS  [phone]  10/21/2019, 4:24 PM  [facility] [address] [place], [name], [address] Phone: [phone]   Fax:  [phone]  Name: [name] MRN: [id number] Date of Birth: [birth year]

## 2019-10-28 ENCOUNTER — Other Ambulatory Visit: Payer: Self-pay | Admitting: Specialist

## 2019-11-06 ENCOUNTER — Other Ambulatory Visit: Payer: Self-pay | Admitting: Cardiovascular Disease

## 2019-11-06 ENCOUNTER — Other Ambulatory Visit: Payer: Self-pay | Admitting: Family Medicine

## 2019-11-10 ENCOUNTER — Other Ambulatory Visit: Payer: Self-pay

## 2019-11-10 DIAGNOSIS — I5023 Acute on chronic systolic (congestive) heart failure: Secondary | ICD-10-CM

## 2019-11-10 MED ORDER — CARVEDILOL 25 MG PO TABS: ORAL_TABLET | ORAL | 3 refills | Status: DC

## 2019-11-10 MED ORDER — FUROSEMIDE 40 MG PO TABS: 40.0000 mg | ORAL_TABLET | Freq: Two times a day (BID) | ORAL | 3 refills | Status: DC

## 2019-11-10 NOTE — Telephone Encounter (Signed)
refilled coreg

## 2019-11-10 NOTE — Telephone Encounter (Signed)
Refilled lasix

## 2019-11-11 DIAGNOSIS — Z0189 Encounter for other specified special examinations: Secondary | ICD-10-CM | POA: Diagnosis not present

## 2019-11-11 DIAGNOSIS — J302 Other seasonal allergic rhinitis: Secondary | ICD-10-CM | POA: Diagnosis not present

## 2019-11-11 DIAGNOSIS — R110 Nausea: Secondary | ICD-10-CM | POA: Diagnosis not present

## 2019-11-11 DIAGNOSIS — E039 Hypothyroidism, unspecified: Secondary | ICD-10-CM | POA: Diagnosis not present

## 2019-11-11 DIAGNOSIS — E559 Vitamin D deficiency, unspecified: Secondary | ICD-10-CM | POA: Diagnosis not present

## 2019-11-11 DIAGNOSIS — G894 Chronic pain syndrome: Secondary | ICD-10-CM | POA: Diagnosis not present

## 2019-11-11 DIAGNOSIS — K219 Gastro-esophageal reflux disease without esophagitis: Secondary | ICD-10-CM | POA: Diagnosis not present

## 2019-11-11 DIAGNOSIS — I10 Essential (primary) hypertension: Secondary | ICD-10-CM | POA: Diagnosis not present

## 2019-11-19 ENCOUNTER — Ambulatory Visit: Payer: Medicare HMO | Admitting: Family Medicine

## 2019-11-24 ENCOUNTER — Other Ambulatory Visit: Payer: Self-pay | Admitting: Cardiovascular Disease

## 2019-11-28 DIAGNOSIS — E039 Hypothyroidism, unspecified: Secondary | ICD-10-CM | POA: Diagnosis not present

## 2019-11-28 DIAGNOSIS — Z0000 Encounter for general adult medical examination without abnormal findings: Secondary | ICD-10-CM | POA: Diagnosis not present

## 2019-11-28 DIAGNOSIS — K219 Gastro-esophageal reflux disease without esophagitis: Secondary | ICD-10-CM | POA: Diagnosis not present

## 2019-11-28 DIAGNOSIS — E559 Vitamin D deficiency, unspecified: Secondary | ICD-10-CM | POA: Diagnosis not present

## 2019-12-03 DIAGNOSIS — I482 Chronic atrial fibrillation, unspecified: Secondary | ICD-10-CM | POA: Diagnosis not present

## 2019-12-03 DIAGNOSIS — N1831 Chronic kidney disease, stage 3a: Secondary | ICD-10-CM | POA: Diagnosis not present

## 2019-12-03 DIAGNOSIS — J302 Other seasonal allergic rhinitis: Secondary | ICD-10-CM | POA: Diagnosis not present

## 2019-12-03 DIAGNOSIS — K219 Gastro-esophageal reflux disease without esophagitis: Secondary | ICD-10-CM | POA: Diagnosis not present

## 2019-12-03 DIAGNOSIS — E559 Vitamin D deficiency, unspecified: Secondary | ICD-10-CM | POA: Diagnosis not present

## 2019-12-03 DIAGNOSIS — R110 Nausea: Secondary | ICD-10-CM | POA: Diagnosis not present

## 2019-12-03 DIAGNOSIS — E039 Hypothyroidism, unspecified: Secondary | ICD-10-CM | POA: Diagnosis not present

## 2019-12-03 DIAGNOSIS — I10 Essential (primary) hypertension: Secondary | ICD-10-CM | POA: Diagnosis not present

## 2019-12-03 DIAGNOSIS — G894 Chronic pain syndrome: Secondary | ICD-10-CM | POA: Diagnosis not present

## 2019-12-26 ENCOUNTER — Ambulatory Visit (INDEPENDENT_AMBULATORY_CARE_PROVIDER_SITE_OTHER): Payer: Medicare HMO | Admitting: *Deleted

## 2019-12-26 DIAGNOSIS — I429 Cardiomyopathy, unspecified: Secondary | ICD-10-CM | POA: Diagnosis not present

## 2019-12-26 LAB — CUP PACEART REMOTE DEVICE CHECK
Battery Remaining Longevity: 9 mo
Battery Voltage: 2.86 V
Brady Statistic AP VP Percent: 6.73 %
Brady Statistic AP VS Percent: 0.04 %
Brady Statistic AS VP Percent: 91.4 %
Brady Statistic AS VS Percent: 1.83 %
Brady Statistic RA Percent Paced: 6.56 %
Brady Statistic RV Percent Paced: 94.28 %
Date Time Interrogation Session: 20210611094724
HighPow Impedance: 68 Ohm
Implantable Lead Implant Date: 20150417
Implantable Lead Implant Date: 20150417
Implantable Lead Implant Date: 20150417
Implantable Lead Location: 753858
Implantable Lead Location: 753859
Implantable Lead Location: 753860
Implantable Lead Model: 4298
Implantable Lead Model: 5076
Implantable Lead Model: 6935
Implantable Pulse Generator Implant Date: 20150417
Lead Channel Impedance Value: 285 Ohm
Lead Channel Impedance Value: 285 Ohm
Lead Channel Impedance Value: 285 Ohm
Lead Channel Impedance Value: 304 Ohm
Lead Channel Impedance Value: 342 Ohm
Lead Channel Impedance Value: 342 Ohm
Lead Channel Impedance Value: 342 Ohm
Lead Channel Impedance Value: 456 Ohm
Lead Channel Impedance Value: 475 Ohm
Lead Channel Impedance Value: 475 Ohm
Lead Channel Impedance Value: 513 Ohm
Lead Channel Impedance Value: 513 Ohm
Lead Channel Impedance Value: 532 Ohm
Lead Channel Pacing Threshold Amplitude: 0.75 V
Lead Channel Pacing Threshold Amplitude: 0.875 V
Lead Channel Pacing Threshold Amplitude: 1.875 V
Lead Channel Pacing Threshold Pulse Width: 0.4 ms
Lead Channel Pacing Threshold Pulse Width: 0.4 ms
Lead Channel Pacing Threshold Pulse Width: 0.8 ms
Lead Channel Sensing Intrinsic Amplitude: 1.25 mV
Lead Channel Sensing Intrinsic Amplitude: 1.25 mV
Lead Channel Sensing Intrinsic Amplitude: 11 mV
Lead Channel Sensing Intrinsic Amplitude: 11 mV
Lead Channel Setting Pacing Amplitude: 2 V
Lead Channel Setting Pacing Amplitude: 2.5 V
Lead Channel Setting Pacing Amplitude: 2.5 V
Lead Channel Setting Pacing Pulse Width: 0.4 ms
Lead Channel Setting Pacing Pulse Width: 0.8 ms
Lead Channel Setting Sensing Sensitivity: 0.3 mV

## 2019-12-29 NOTE — Progress Notes (Signed)
Remote ICD transmission.   

## 2020-01-01 DIAGNOSIS — Z1231 Encounter for screening mammogram for malignant neoplasm of breast: Secondary | ICD-10-CM | POA: Diagnosis not present

## 2020-01-06 ENCOUNTER — Ambulatory Visit: Payer: Medicare Other | Admitting: Family Medicine

## 2020-01-12 ENCOUNTER — Ambulatory Visit: Payer: Medicare Other | Admitting: Family Medicine

## 2020-03-01 ENCOUNTER — Ambulatory Visit: Payer: Medicare HMO | Admitting: Cardiovascular Disease

## 2020-03-02 ENCOUNTER — Encounter: Payer: Self-pay | Admitting: Cardiology

## 2020-03-02 NOTE — Progress Notes (Signed)
Cardiology Office Note  Date: 03/03/2020   ID: Mary, Walton 23-Feb-1941, MRN [id number]  PCP:  Celene Squibb, MD  Cardiologist:  Rozann Lesches, MD Electrophysiologist:  Cristopher Peru, MD   Chief Complaint  Patient presents with  . Cardiac follow-up    History of Present Illness: [name] is a 79 y.o. female former patient of Dr. Bronson Ing now presenting to establish follow-up with me.  She was last seen in 2019.  I reviewed her records and updated the chart.  She states that she has been doing reasonably well.  Occasionally feels a sense of palpitations, but states that this is not very frequent.  She has had no dizziness or syncope.  She reports NYHA class II dyspnea with typical ADLs.  She follows with Dr. Lovena Le, Medtronic biventricular ICD in place.  Device interrogation in June revealed normal function.  She does not report any device shocks.  Her last echocardiogram was in 2018 at which point LVEF was 40%.  We went over her medications today which are outlined below and also discussed arranging a follow-up echocardiogram.  She has established with Dr. Nevada Crane for primary care, has lab work pending soon with clinical assessment.  She does not report any bleeding problems on Xarelto.  I personally reviewed her ECG today which shows ventricular pacing with intermittent atrial pacing and PVCs.  Past Medical History:  Diagnosis Date  . Allergy   . Anemia   . Atrial fibrillation ([place])   . 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 Cataract   . Cerebrovascular accident [facility])    No clinical event; asymptomatic CT finding  . CHF (congestive heart failure) ([place])   . Chronic kidney disease   . COPD (chronic obstructive pulmonary disease) ([place])   . DDD (degenerative disc disease), cervical    Cervical  . Degenerative joint disease    Right hip and knee  . Essential  hypertension   . GERD (gastroesophageal reflux disease)   . Hip dislocation, right ([place])   . Hyperlipemia   . Hypothyroid   . LBBB (left bundle branch block)   . S/P colonoscopy 2009   Dr. Sharlett Iles: reportedly normal per pt  . S/P endoscopy 1980s    Past Surgical History:  Procedure Laterality Date  . ABDOMINAL HYSTERECTOMY    . APPENDECTOMY    . BI-VENTRICULAR IMPLANTABLE CARDIOVERTER DEFIBRILLATOR  (CRT-D)  10-31-2013   MDT Hillery Aldo CRTD implanted by Dr Lovena Le  . BIOPSY  12/18/2018   Procedure: BIOPSY;  Surgeon: Daneil Dolin, MD;  Location: [facility] ENDO [facility];  Service: Endoscopy;;  Gastric   . BREAST BIOPSY     bilaterally; benign  . can not have MRI due to jaw surgery    . CHOLECYSTECTOMY    . COLONOSCOPY  2009   Dr. Sharlett Iles: Normal  . ESOPHAGOGASTRODUODENOSCOPY  07/13/2011   small hh, patchy gastric erythema with scarring deformity of antrum, chronic gastritis, bx benign with no H.Pylori. Procedure: ESOPHAGOGASTRODUODENOSCOPY (EGD);  Surgeon: Daneil Dolin, MD;  Location: [facility] ENDO SU[facility];  Service: Endoscopy;  Laterality: N/A;  10:45  . ESOPHAGOGASTRODUODENOSCOPY N/A 12/18/2018   Dr. Gala Romney: normal esophagus s/p dilation, small hh, erythema in stomach with benign biopsy.   . IMPLANTABLE CARDIOVERTER DEFIBRILLATOR IMPLANT N/A 10/31/2013   Procedure: IMPLANTABLE CARDIOVERTER DEFIBRILLATOR IMPLANT;  Surgeon: Evans Lance, MD;  Location: [facility] CATH LAB;  Service: Cardiovascular;  Laterality: N/A;  . Venia Minks DILATION N/A 12/18/2018   Procedure: Venia Minks DILATION;  Surgeon: Daneil Dolin, MD;  Location: [facility]O [facility];  Service: Endoscopy;  Laterality: N/A;  . MANDIBLE SURGERY     secondary to malocclusion  . THYROIDECTOMY     neoplasm  . Ureteral Surgery  1970    Current Outpatient Medications  Medication Sig Dispense Refill  . benazepril (LOTENSIN) 40 MG tablet TAKE 1 TABLET BY MOUTH IN THE MORNING 90 tablet 1  . carvedilol (COREG) 25 MG tablet TAKE 1 TABLET BY MOUTH TWICE DAILY  WITH A MEAL 60 tablet 3  . cholecalciferol (VITAMIN D) 1000 UNITS tablet Take 2,000 Units by mouth.     . fexofenadine (ALLEGRA) 180 MG tablet Take 180 mg by mouth every morning.     . fluticasone (FLONASE) 50 MCG/ACT nasal spray Place 2 sprays into both nostrils daily as needed for allergies. 16 g 0  . furosemide (LASIX) 40 MG tablet Take 1 tablet (40 mg total) by mouth 2 (two) times daily. as directed 60 tablet 3  . hydrALAZINE (APRESOLINE) 25 MG tablet TAKE 1 TABLET BY MOUTH THREE TIMES DAILY 270 tablet 1  . lansoprazole (PREVACID) 30 MG capsule Take 1 capsule (30 mg total) by mouth daily before breakfast. 90 capsule 3  . ondansetron (ZOFRAN) 4 MG tablet Take 1 tablet (4 mg total) by mouth every 8 (eight) hours as needed for nausea or vomiting. 20 tablet 0  . SYNTHROID 125 MCG tablet Take 1 tablet by mouth once daily 30 tablet 11  . traMADol (ULTRAM) 50 MG tablet Take 1 tablet (50 mg total) by mouth every 6 (six) hours as needed for moderate pain. 30 tablet 0  . XARELTO 20 MG TABS tablet TAKE 1 TABLET BY MOUTH ONCE DAILY WITH SUPPER 30 tablet 11   No current facility-administered medications for this visit.   Allergies:  Ciprofloxacin, Dexlansoprazole, Statins, and Amoxicillin   ROS:   Chronic discomfort at ICD site.  Physical Exam: VS:  BP (!) 148/78   Pulse 64   Ht 5\' 7"  (1.702 m)   Wt 237 lb (107.5 kg)   SpO2 96%   BMI 37.12 kg/m , BMI Body mass index is 37.12 kg/m.  Wt Readings from Last 3 Encounters:  03/03/20 237 lb (107.5 kg)  10/21/19 242 lb (109.8 kg)  10/07/19 244 lb 9.6 oz (110.9 kg)    General: Elderly woman, appears comfortable at rest. HEENT: Conjunctiva and lids normal, wearing a mask. Neck: Supple, no elevated JVP or carotid bruits, no thyromegaly. Lungs: Clear to auscultation, nonlabored breathing at rest. Cardiac: Regular rate and rhythm, no S3 or significant systolic murmur, no pericardial rub. Extremities: No pitting edema, distal pulses 2+.  ECG:  An  ECG dated 12/25/2017 was personally reviewed today and demonstrated:  Ventricular pacing with atrial sensing.  Recent Labwork: 07/21/2019: ALT 10; AST 13; BUN 17; Creat 1.17; Potassium 4.0; Sodium 139 09/24/2019: TSH 1.96     Component Value Date/Time   CHOL 205 (H) 07/21/2019 0904   TRIG 77 07/21/2019 0904   HDL 47 (L) 07/21/2019 0904   CHOLHDL 4.4 07/21/2019 0904   LDLCALC 141 (H) 07/21/2019 0904    Other Studies Reviewed Today:  Echocardiogram 04/18/2017: Study Conclusions   - Left ventricle: The cavity size was normal. Wall thickness was  increased in a pattern of mild LVH. Systolic function was mildly  to moderately reduced. The estimated ejection fraction was 40%.  Diffuse hypokinesis.  Doppler parameters are consistent with  abnormal left ventricular relaxation (grade 1 diastolic  dysfunction). Doppler parameters are consistent with high  ventricular filling pressure.  - Regional wall motion abnormality: Hypokinesis of the mid inferior  and mid inferolateral myocardium.  - Ventricular septum: Septal motion showed abnormal function and  dyssynergy. These changes are consistent with right ventricular  pacing.  - Mitral valve: Calcified annulus. Mildly thickened leaflets .  - Left atrium: The atrium was mildly dilated.  - Right ventricle: Pacer wire or catheter noted in right ventricle.  - Right atrium: Pacer wire or catheter noted in right atrium.  - Tricuspid valve: Mildly thickened leaflets. There was mild  regurgitation.   Assessment and Plan:  1.  Nonischemic cardiomyopathy with LVEF 40% by last assessment in 2018.  She continues on medical therapy and has a Medtronic biventricular ICD in place.  We will obtain a follow-up echocardiogram.  For now continue Coreg, Lotensin, and hydralazine.  2.  Paroxysmal atrial fibrillation.  She reports occasional palpitations, no progressive symptoms.  She continues on Xarelto for stroke prophylaxis.  Due for  follow-up lab work with Dr. Nevada Crane soon.  No reported bleeding problems.  3.  Medtronic biventricular ICD in place, follows with Dr. Lovena Le.  No device shocks or syncope.  Medication Adjustments/Labs and Tests Ordered: Current medicines are reviewed at length with the patient today.  Concerns regarding medicines are outlined above.   Tests Ordered: Orders Placed This Encounter  Procedures  . EKG 12-Lead  . ECHOCARDIOGRAM COMPLETE    Medication Changes: No orders of the defined types were placed in this encounter.   Disposition:  Follow up 1 year in the [place] office.  Signed, Satira Sark, MD, [facility] 03/03/2020 2:57 PM     [facility] [address], [place], [place] [address] Phone: [phone]; Fax: [phone]

## 2020-03-03 ENCOUNTER — Other Ambulatory Visit: Payer: Self-pay

## 2020-03-03 ENCOUNTER — Ambulatory Visit (INDEPENDENT_AMBULATORY_CARE_PROVIDER_SITE_OTHER): Payer: Medicare HMO | Admitting: Cardiology

## 2020-03-03 ENCOUNTER — Encounter: Payer: Self-pay | Admitting: Cardiology

## 2020-03-03 VITALS — BP 148/78 | HR 64 | Ht 67.0 in | Wt 237.0 lb

## 2020-03-03 DIAGNOSIS — I428 Other cardiomyopathies: Secondary | ICD-10-CM | POA: Diagnosis not present

## 2020-03-03 DIAGNOSIS — Z95810 Presence of automatic (implantable) cardiac defibrillator: Secondary | ICD-10-CM | POA: Diagnosis not present

## 2020-03-03 DIAGNOSIS — I480 Paroxysmal atrial fibrillation: Secondary | ICD-10-CM | POA: Diagnosis not present

## 2020-03-03 NOTE — Patient Instructions (Signed)
Medication Instructions:  Your physician recommends that you continue on your current medications as directed. Please refer to the Current Medication list given to you today.  *If you need a refill on your cardiac medications before your next appointment, please call your pharmacy*   Lab Work: None today If you have labs (blood work) drawn today and your tests are completely normal, you will receive your results only by: . MyChart Message (if you have MyChart) OR . A paper copy in the mail If you have any lab test that is abnormal or we need to change your treatment, we will call you to review the results.   Testing/Procedures: Your physician has requested that you have an echocardiogram. Echocardiography is a painless test that uses sound waves to create images of your heart. It provides your doctor with information about the size and shape of your heart and how well your heart's chambers and valves are working. This procedure takes approximately one hour. There are no restrictions for this procedure.     Follow-Up: At [facility], you and your health needs are our priority.  As part of our continuing mission to provide you with exceptional heart care, we have created designated Provider Care Teams.  These Care Teams include your primary Cardiologist (physician) and Advanced Practice Providers (APPs -  Physician Assistants and Nurse Practitioners) who all work together to provide you with the care you need, when you need it.  We recommend signing up for the patient portal called "MyChart".  Sign up information is provided on this After Visit Summary.  MyChart is used to connect with patients for Virtual Visits (Telemedicine).  Patients are able to view lab/test results, encounter notes, upcoming appointments, etc.  Non-urgent messages can be sent to your provider as well.   To learn more about what you can do with MyChart, go to [email or web address].    Your next appointment:   12  month(s)  The format for your next appointment:   In Person  Provider:   Samuel McDowell, MD   Other Instructions None      Thank you for choosing [facility] !         

## 2020-03-08 ENCOUNTER — Ambulatory Visit (HOSPITAL_COMMUNITY)
Admission: RE | Admit: 2020-03-08 | Discharge: 2020-03-08 | Disposition: A | Discharge status: Home / Self Care | Payer: Medicare HMO | Source: Ambulatory Visit | Attending: Cardiology | Admitting: Cardiology

## 2020-03-08 DIAGNOSIS — I428 Other cardiomyopathies: Secondary | ICD-10-CM

## 2020-03-08 LAB — ECHOCARDIOGRAM COMPLETE
Area-P 1/2: 3.15 cm2
Calc EF: 55.6 %
S' Lateral: 3.17 cm
Single Plane A2C EF: 57.9 %
Single Plane A4C EF: 55.1 %

## 2020-03-08 NOTE — Progress Notes (Signed)
*  PRELIMINARY RESULTS* Echocardiogram 2D Echocardiogram has been performed.  [name] 03/08/2020, 3:35 PM

## 2020-03-12 DIAGNOSIS — I10 Essential (primary) hypertension: Secondary | ICD-10-CM | POA: Diagnosis not present

## 2020-03-12 DIAGNOSIS — E039 Hypothyroidism, unspecified: Secondary | ICD-10-CM | POA: Diagnosis not present

## 2020-03-16 DIAGNOSIS — G894 Chronic pain syndrome: Secondary | ICD-10-CM | POA: Diagnosis not present

## 2020-03-16 DIAGNOSIS — E559 Vitamin D deficiency, unspecified: Secondary | ICD-10-CM | POA: Diagnosis not present

## 2020-03-16 DIAGNOSIS — I10 Essential (primary) hypertension: Secondary | ICD-10-CM | POA: Diagnosis not present

## 2020-03-16 DIAGNOSIS — E039 Hypothyroidism, unspecified: Secondary | ICD-10-CM | POA: Diagnosis not present

## 2020-03-16 DIAGNOSIS — R110 Nausea: Secondary | ICD-10-CM | POA: Diagnosis not present

## 2020-03-16 DIAGNOSIS — J302 Other seasonal allergic rhinitis: Secondary | ICD-10-CM | POA: Diagnosis not present

## 2020-03-16 DIAGNOSIS — Z6841 Body Mass Index (BMI) 40.0 and over, adult: Secondary | ICD-10-CM | POA: Diagnosis not present

## 2020-03-16 DIAGNOSIS — N1831 Chronic kidney disease, stage 3a: Secondary | ICD-10-CM | POA: Diagnosis not present

## 2020-03-16 DIAGNOSIS — K219 Gastro-esophageal reflux disease without esophagitis: Secondary | ICD-10-CM | POA: Diagnosis not present

## 2020-03-16 DIAGNOSIS — N3941 Urge incontinence: Secondary | ICD-10-CM | POA: Diagnosis not present

## 2020-03-29 ENCOUNTER — Ambulatory Visit (INDEPENDENT_AMBULATORY_CARE_PROVIDER_SITE_OTHER): Payer: Medicare HMO | Admitting: *Deleted

## 2020-03-29 DIAGNOSIS — I428 Other cardiomyopathies: Secondary | ICD-10-CM | POA: Diagnosis not present

## 2020-03-29 LAB — CUP PACEART REMOTE DEVICE CHECK
Battery Remaining Longevity: 7 mo
Battery Voltage: 2.83 V
Brady Statistic AP VP Percent: 10.23 %
Brady Statistic AP VS Percent: 0.03 %
Brady Statistic AS VP Percent: 88.21 %
Brady Statistic AS VS Percent: 1.53 %
Brady Statistic RA Percent Paced: 9.79 %
Brady Statistic RV Percent Paced: 93.27 %
Date Time Interrogation Session: 20210912102307
HighPow Impedance: 66 Ohm
Implantable Lead Implant Date: 20150417
Implantable Lead Implant Date: 20150417
Implantable Lead Implant Date: 20150417
Implantable Lead Location: 753858
Implantable Lead Location: 753859
Implantable Lead Location: 753860
Implantable Lead Model: 4298
Implantable Lead Model: 5076
Implantable Lead Model: 6935
Implantable Pulse Generator Implant Date: 20150417
Lead Channel Impedance Value: 285 Ohm
Lead Channel Impedance Value: 304 Ohm
Lead Channel Impedance Value: 304 Ohm
Lead Channel Impedance Value: 304 Ohm
Lead Channel Impedance Value: 342 Ohm
Lead Channel Impedance Value: 342 Ohm
Lead Channel Impedance Value: 361 Ohm
Lead Channel Impedance Value: 475 Ohm
Lead Channel Impedance Value: 475 Ohm
Lead Channel Impedance Value: 475 Ohm
Lead Channel Impedance Value: 513 Ohm
Lead Channel Impedance Value: 513 Ohm
Lead Channel Impedance Value: 551 Ohm
Lead Channel Pacing Threshold Amplitude: 0.625 V
Lead Channel Pacing Threshold Amplitude: 0.75 V
Lead Channel Pacing Threshold Amplitude: 2 V
Lead Channel Pacing Threshold Pulse Width: 0.4 ms
Lead Channel Pacing Threshold Pulse Width: 0.4 ms
Lead Channel Pacing Threshold Pulse Width: 0.8 ms
Lead Channel Sensing Intrinsic Amplitude: 1.5 mV
Lead Channel Sensing Intrinsic Amplitude: 1.5 mV
Lead Channel Sensing Intrinsic Amplitude: 8 mV
Lead Channel Sensing Intrinsic Amplitude: 8 mV
Lead Channel Setting Pacing Amplitude: 2 V
Lead Channel Setting Pacing Amplitude: 2.5 V
Lead Channel Setting Pacing Amplitude: 2.5 V
Lead Channel Setting Pacing Pulse Width: 0.4 ms
Lead Channel Setting Pacing Pulse Width: 0.8 ms
Lead Channel Setting Sensing Sensitivity: 0.3 mV

## 2020-03-31 NOTE — Progress Notes (Signed)
Remote ICD transmission.   

## 2020-04-12 ENCOUNTER — Other Ambulatory Visit: Payer: Self-pay | Admitting: *Deleted

## 2020-04-12 MED ORDER — CARVEDILOL 25 MG PO TABS: ORAL_TABLET | ORAL | 6 refills | Status: DC

## 2020-04-30 ENCOUNTER — Other Ambulatory Visit: Payer: Self-pay | Admitting: Family Medicine

## 2020-05-28 ENCOUNTER — Other Ambulatory Visit: Payer: Self-pay

## 2020-05-28 MED ORDER — BENAZEPRIL HCL 40 MG PO TABS: 40.0000 mg | ORAL_TABLET | Freq: Every morning | ORAL | 1 refills | Status: DC

## 2020-05-28 MED ORDER — HYDRALAZINE HCL 25 MG PO TABS: 25.0000 mg | ORAL_TABLET | Freq: Three times a day (TID) | ORAL | 1 refills | Status: DC

## 2020-05-28 NOTE — Telephone Encounter (Addendum)
Refilled hydralazine and benazepril

## 2020-05-28 NOTE — Addendum Note (Signed)
Addended by: Barbarann Ehlers A on: 05/28/2020 08:13 AM   Modules accepted: Orders

## 2020-06-14 DIAGNOSIS — H02831 Dermatochalasis of right upper eyelid: Secondary | ICD-10-CM | POA: Diagnosis not present

## 2020-06-14 DIAGNOSIS — H01005 Unspecified blepharitis left lower eyelid: Secondary | ICD-10-CM | POA: Diagnosis not present

## 2020-06-14 DIAGNOSIS — H01002 Unspecified blepharitis right lower eyelid: Secondary | ICD-10-CM | POA: Diagnosis not present

## 2020-06-14 DIAGNOSIS — H02834 Dermatochalasis of left upper eyelid: Secondary | ICD-10-CM | POA: Diagnosis not present

## 2020-06-14 DIAGNOSIS — H01004 Unspecified blepharitis left upper eyelid: Secondary | ICD-10-CM | POA: Diagnosis not present

## 2020-06-14 DIAGNOSIS — H35373 Puckering of macula, bilateral: Secondary | ICD-10-CM | POA: Diagnosis not present

## 2020-06-14 DIAGNOSIS — H25812 Combined forms of age-related cataract, left eye: Secondary | ICD-10-CM | POA: Diagnosis not present

## 2020-06-14 DIAGNOSIS — H01001 Unspecified blepharitis right upper eyelid: Secondary | ICD-10-CM | POA: Diagnosis not present

## 2020-06-18 DIAGNOSIS — E782 Mixed hyperlipidemia: Secondary | ICD-10-CM | POA: Diagnosis not present

## 2020-06-18 DIAGNOSIS — E039 Hypothyroidism, unspecified: Secondary | ICD-10-CM | POA: Diagnosis not present

## 2020-06-18 DIAGNOSIS — I10 Essential (primary) hypertension: Secondary | ICD-10-CM | POA: Diagnosis not present

## 2020-06-23 DIAGNOSIS — I482 Chronic atrial fibrillation, unspecified: Secondary | ICD-10-CM | POA: Diagnosis not present

## 2020-06-23 DIAGNOSIS — E559 Vitamin D deficiency, unspecified: Secondary | ICD-10-CM | POA: Diagnosis not present

## 2020-06-23 DIAGNOSIS — R110 Nausea: Secondary | ICD-10-CM | POA: Diagnosis not present

## 2020-06-23 DIAGNOSIS — N1831 Chronic kidney disease, stage 3a: Secondary | ICD-10-CM | POA: Diagnosis not present

## 2020-06-23 DIAGNOSIS — K219 Gastro-esophageal reflux disease without esophagitis: Secondary | ICD-10-CM | POA: Diagnosis not present

## 2020-06-23 DIAGNOSIS — G894 Chronic pain syndrome: Secondary | ICD-10-CM | POA: Diagnosis not present

## 2020-06-23 DIAGNOSIS — I10 Essential (primary) hypertension: Secondary | ICD-10-CM | POA: Diagnosis not present

## 2020-06-23 DIAGNOSIS — Z0001 Encounter for general adult medical examination with abnormal findings: Secondary | ICD-10-CM | POA: Diagnosis not present

## 2020-06-23 DIAGNOSIS — E039 Hypothyroidism, unspecified: Secondary | ICD-10-CM | POA: Diagnosis not present

## 2020-06-23 DIAGNOSIS — J302 Other seasonal allergic rhinitis: Secondary | ICD-10-CM | POA: Diagnosis not present

## 2020-06-28 ENCOUNTER — Ambulatory Visit (INDEPENDENT_AMBULATORY_CARE_PROVIDER_SITE_OTHER): Payer: Medicare HMO

## 2020-06-28 DIAGNOSIS — I428 Other cardiomyopathies: Secondary | ICD-10-CM

## 2020-06-28 DIAGNOSIS — I5022 Chronic systolic (congestive) heart failure: Secondary | ICD-10-CM

## 2020-06-29 LAB — CUP PACEART REMOTE DEVICE CHECK
Battery Remaining Longevity: 4 mo
Battery Voltage: 2.8 V
Brady Statistic AP VP Percent: 12.59 %
Brady Statistic AP VS Percent: 0.03 %
Brady Statistic AS VP Percent: 85.62 %
Brady Statistic AS VS Percent: 1.76 %
Brady Statistic RA Percent Paced: 11.96 %
Brady Statistic RV Percent Paced: 92.61 %
Date Time Interrogation Session: 20211213183105
HighPow Impedance: 66 Ohm
Implantable Lead Implant Date: 20150417
Implantable Lead Implant Date: 20150417
Implantable Lead Implant Date: 20150417
Implantable Lead Location: 753858
Implantable Lead Location: 753859
Implantable Lead Location: 753860
Implantable Lead Model: 4298
Implantable Lead Model: 5076
Implantable Lead Model: 6935
Implantable Pulse Generator Implant Date: 20150417
Lead Channel Impedance Value: 285 Ohm
Lead Channel Impedance Value: 285 Ohm
Lead Channel Impedance Value: 304 Ohm
Lead Channel Impedance Value: 304 Ohm
Lead Channel Impedance Value: 304 Ohm
Lead Channel Impedance Value: 342 Ohm
Lead Channel Impedance Value: 342 Ohm
Lead Channel Impedance Value: 475 Ohm
Lead Channel Impedance Value: 513 Ohm
Lead Channel Impedance Value: 513 Ohm
Lead Channel Impedance Value: 513 Ohm
Lead Channel Impedance Value: 532 Ohm
Lead Channel Impedance Value: 532 Ohm
Lead Channel Pacing Threshold Amplitude: 0.625 V
Lead Channel Pacing Threshold Amplitude: 0.625 V
Lead Channel Pacing Threshold Amplitude: 1.625 V
Lead Channel Pacing Threshold Pulse Width: 0.4 ms
Lead Channel Pacing Threshold Pulse Width: 0.4 ms
Lead Channel Pacing Threshold Pulse Width: 0.8 ms
Lead Channel Sensing Intrinsic Amplitude: 1.5 mV
Lead Channel Sensing Intrinsic Amplitude: 1.5 mV
Lead Channel Sensing Intrinsic Amplitude: 10 mV
Lead Channel Sensing Intrinsic Amplitude: 10 mV
Lead Channel Setting Pacing Amplitude: 2 V
Lead Channel Setting Pacing Amplitude: 2.5 V
Lead Channel Setting Pacing Amplitude: 2.5 V
Lead Channel Setting Pacing Pulse Width: 0.4 ms
Lead Channel Setting Pacing Pulse Width: 0.8 ms
Lead Channel Setting Sensing Sensitivity: 0.3 mV

## 2020-07-12 NOTE — Progress Notes (Signed)
Remote ICD transmission.   

## 2020-07-22 NOTE — H&P (Signed)
Surgical History & Physical  Patient Name: [name] DOB: Nov 22, 1940  Surgery: Cataract extraction with intraocular lens implant phacoemulsification; Left Eye  Surgeon: Fabio Pierce MD Surgery Date:  08/06/2020 Pre-Op Date:  07/22/2020  HPI: A 81 Yr. old female patient is referred by Dr Charise Killian for cataract eval. 1. The patient complains of difficulty when driving due to glare from headlights or sun, which began 1 year ago. Both eyes are affected OS>OD The episode is gradual. The condition's severity increased since last visit. Symptoms occur when the patient is driving, inside and outside. This is negatively effecting the patient's quality of life. HPI Completed by Dr. Fabio Pierce  Medical History: Cataracts Heart Problem High Blood Pressure Stroke Thyroid Problems  Review of Systems Negative Allergic/Immunologic Negative Cardiovascular Negative Constitutional Negative Ear, Nose, Mouth & Throat Negative Endocrine Negative Eyes Negative Gastrointestinal Negative Genitourinary Negative Hemotologic/Lymphatic Negative Integumentary Negative Musculoskeletal Negative Neurological Negative Psychiatry Negative Respiratory  Social   Never smoked   Medication TheraTears,  Allegra, Benazepril, Carvedilol, Centrum Silver, Clonidine, Flonase, Furosemide, Prevacid, Spironolactone, Synthroid, Tramadol, Vitamin D, Xarelto,   Sx/Procedures Hystectomy, Thyroid Removal,   Drug Allergies  Cipro, Delansoprozale, Statins,   History & Physical: Heent:  Cataract, Left eye NECK: supple without bruits LUNGS: lungs clear to auscultation CV: regular rate and rhythm Abdomen: soft and non-tender  Impression & Plan: Assessment: 1.  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  DERMATOCHALASIS, no surgery; Right Upper Lid, Left Upper Lid (H02.831, H02.834) 4.  BLEPHARITIS; Right Upper Lid, Right Lower Lid, Left Upper Lid, Left  Lower Lid (H01.001, H01.002,H01.004,H01.005)  Plan: 1.  Cataract accounts for the patient's decreased vision. This visual impairment is not correctable with a tolerable change in glasses or contact lenses. Cataract surgery with an implantation of a new lens should significantly improve the visual and functional status of the patient. Discussed all risks, benefits, alternatives, and potential complications. Discussed the procedures and recovery. Patient desires to have surgery. A-scan ordered and performed today for intra-ocular lens calculations. The surgery will be performed in order to improve vision for driving, reading, and for eye examinations. Recommend phacoemulsification with intra-ocular lens. Recommend Dextenza for post-operative pain and inflammation. Left Eye worse - first. Dilates well - shugarcaine by protocol. 2.  Asymptomatic. Stable. Findings, prognosis and treatment options reviewed. Despite presence of pucker patient would like to observe for now. Symptomatic. 3.  Asymptomatic, recommend observation for now. Findings, prognosis and treatment options reviewed. 4.  Begin lid scrubs.

## 2020-07-30 NOTE — Patient Instructions (Signed)
[name]  07/30/2020     @PREFPERIOPPHARMACY @   Your procedure is scheduled on  08/06/2020.  Report to [facility] at  0930  A.M.  Call this number if you have problems the morning of surgery:  [phone]   Remember:  Do not eat or drink after midnight.                       Take these medicines the morning of surgery with A SIP OF WATER  Carvedilol, prevacid, zofran(if needed), synthroid, ultram.    Do not wear jewelry, make-up or nail polish.  Do not wear lotions, powders, or perfumes, or deodorant. Please brush your teeth.  Do not shave 48 hours prior to surgery.  Men may shave face and neck.  Do not bring valuables to the hospital.  [facility] is not responsible for any belongings or valuables.  Contacts, dentures or bridgework may not be worn into surgery.  Leave your suitcase in the car.  After surgery it may be brought to your room.  For patients admitted to the hospital, discharge time will be determined by your treatment team.  Patients discharged the day of surgery will not be allowed to drive home.   Name and phone number of your driver:   Family   Special instructions:  DO NOT smoke the morning of your procedure.  Please read over the following fact sheets that you were given. Anesthesia Post-op Instructions and Care and Recovery After Surgery      Cataract Surgery, Care After This sheet gives you information about how to care for yourself after your procedure. Your health care provider may also give you more specific instructions. If you have problems or questions, contact your health care provider. What can I expect after the procedure? After the procedure, it is common to have:  Itching.  Discomfort.  Fluid discharge.  Sensitivity to light and to touch.  Bruising in or around the eye.  Mild blurred vision. Follow these instructions at home: Eye care  Do not touch or rub your eyes.  Protect your eyes as told by your health care  provider. You may be told to wear a protective eye shield or sunglasses.  Do not put a contact lens into the affected eye or eyes until your health care provider approves.  Keep the area around your eye clean and dry: ? Avoid swimming. ? Do not allow water to hit you directly in the face while showering. ? Keep soap and shampoo out of your eyes.  Check your eye every day for signs of infection. Watch for: ? Redness, swelling, or pain. ? Fluid, blood, or pus. ? Warmth. ? A bad smell. ? Vision that is getting worse. ? Sensitivity that is getting worse.   Activity  Do not drive for 24 hours if you were given a sedative during your procedure.  Avoid strenuous activities, such as playing contact sports, for as long as told by your health care provider.  Do not drive or use heavy machinery until your health care provider approves.  Do not bend or lift heavy objects. Bending increases pressure in the eye. You can walk, climb stairs, and do light household chores.  Ask your health care provider when you can return to work. If you work in a dusty environment, you may be advised to wear protective eyewear for a period of time. General instructions  Take or apply over-the-counter and prescription medicines  only as told by your health care provider. This includes eye drops.  Keep all follow-up visits as told by your health care provider. This is important. Contact a health care provider if:  You have increased bruising around your eye.  You have pain that is not helped with medicine.  You have a fever.  You have redness, swelling, or pain in your eye.  You have fluid, blood, or pus coming from your incision.  Your vision gets worse.  Your sensitivity to light gets worse. Get help right away if:  You have sudden loss of vision.  You see flashes of light or spots (floaters).  You have severe eye pain.  You develop nausea or vomiting. Summary  After your procedure, it is  common to have itching, discomfort, bruising, fluid discharge, or sensitivity to light.  Follow instructions from your health care provider about caring for your eye after the procedure.  Do not rub your eye after the procedure. You may need to wear eye protection or sunglasses. Do not wear contact lenses. Keep the area around your eye clean and dry.  Avoid activities that require a lot of effort. These include playing sports and lifting heavy objects.  Contact a health care provider if you have increased bruising, pain that does not go away, or a fever. Get help right away if you suddenly lose your vision, see flashes of light or spots, or have severe pain in the eye. This information is not intended to replace advice given to you by your health care provider. Make sure you discuss any questions you have with your health care provider. Document Revised: 04/29/2019 Document Reviewed: 12/31/2017 Elsevier Patient Education  [employer].

## 2020-08-03 ENCOUNTER — Encounter (HOSPITAL_COMMUNITY): Payer: Self-pay

## 2020-08-03 ENCOUNTER — Encounter (HOSPITAL_COMMUNITY)
Admission: RE | Admit: 2020-08-03 | Discharge: 2020-08-03 | Disposition: A | Discharge status: Home / Self Care | Payer: Medicare HMO | Source: Ambulatory Visit | Attending: Ophthalmology | Admitting: Ophthalmology

## 2020-08-04 ENCOUNTER — Other Ambulatory Visit (HOSPITAL_COMMUNITY): Payer: Medicare HMO

## 2020-08-11 NOTE — H&P (Signed)
Surgical History & Physical  Patient Name: [name] DOB: 06-27-1941  Surgery: Cataract extraction with intraocular lens implant phacoemulsification; Left Eye  Surgeon: Baruch Goldmann MD Surgery Date:  08/20/2020 Pre-Op Date:  08/03/2020  HPI: A 89 Yr. old female patient is referred by Dr Jorja Loa for cataract eval. 1. The patient complains of difficulty when driving due to glare from headlights or sun, which began 1 year ago. Both eyes are affected OS>OD The episode is gradual. The condition's severity increased since last visit. Symptoms occur when the patient is driving, inside and outside. This is negatively effecting the patient's quality of life. HPI Completed by Dr. Baruch Goldmann  Medical History: Cataracts Heart Problem High Blood Pressure Stroke Thyroid Problems  Review of Systems Negative Allergic/Immunologic Negative Cardiovascular Negative Constitutional Negative Ear, Nose, Mouth & Throat Negative Endocrine Negative Eyes Negative Gastrointestinal Negative Genitourinary Negative Hemotologic/Lymphatic Negative Integumentary Negative Musculoskeletal Negative Neurological Negative Psychiatry Negative Respiratory  Social   Never smoked  Medication TheraTears,  Allegra, Benazepril, Carvedilol, Centrum Silver, Clonidine, Flonase, Furosemide, Prevacid, Spironolactone, Synthroid, Tramadol, Vitamin D, Xarelto,   Sx/Procedures Hystectomy, Thyroid Removal,   Drug Allergies  Cipro, Delansoprozale, Statins,   History & Physical: Heent:  Cataract, Left eye NECK: supple without bruits LUNGS: lungs clear to auscultation CV: regular rate and rhythm Abdomen: soft and non-tender  Impression & Plan: Assessment: 1.  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  DERMATOCHALASIS, no surgery; Right Upper Lid, Left Upper Lid (H02.831, H02.834) 4.  BLEPHARITIS; Right Upper Lid, Right Lower Lid, Left Upper Lid, Left  Lower Lid (H01.001, H01.002,H01.004,H01.005)  Plan: 1.  Cataract accounts for the patient's decreased vision. This visual impairment is not correctable with a tolerable change in glasses or contact lenses. Cataract surgery with an implantation of a new lens should significantly improve the visual and functional status of the patient. Discussed all risks, benefits, alternatives, and potential complications. Discussed the procedures and recovery. Patient desires to have surgery. A-scan ordered and performed today for intra-ocular lens calculations. The surgery will be performed in order to improve vision for driving, reading, and for eye examinations. Recommend phacoemulsification with intra-ocular lens. Recommend Dextenza for post-operative pain and inflammation. Left Eye worse - first. Dilates well - shugarcaine by protocol. 2.  Asymptomatic. Stable. Findings, prognosis and treatment options reviewed. Despite presence of pucker patient would like to observe for now. Symptomatic. 3.  Asymptomatic, recommend observation for now. Findings, prognosis and treatment options reviewed. 4.  Begin lid scrubs.

## 2020-08-13 ENCOUNTER — Other Ambulatory Visit (HOSPITAL_COMMUNITY): Payer: Medicare HMO

## 2020-08-16 ENCOUNTER — Encounter (HOSPITAL_COMMUNITY)
Admission: RE | Admit: 2020-08-16 | Discharge: 2020-08-16 | Disposition: A | Discharge status: Home / Self Care | Payer: Medicare HMO | Source: Ambulatory Visit | Attending: Ophthalmology | Admitting: Ophthalmology

## 2020-08-16 ENCOUNTER — Other Ambulatory Visit: Payer: Self-pay

## 2020-08-17 ENCOUNTER — Other Ambulatory Visit: Payer: Self-pay | Admitting: *Deleted

## 2020-08-17 MED ORDER — RIVAROXABAN 20 MG PO TABS: ORAL_TABLET | ORAL | 3 refills | Status: DC

## 2020-08-17 NOTE — OR Nursing (Signed)
Patient aware of procedure cancelled at this time.  Office will notify her when procedure will be rescheduled.

## 2020-08-18 ENCOUNTER — Other Ambulatory Visit (HOSPITAL_COMMUNITY): Payer: Medicare HMO

## 2020-08-18 ENCOUNTER — Other Ambulatory Visit (HOSPITAL_COMMUNITY)
Admission: RE | Admit: 2020-08-18 | Discharge: 2020-08-18 | Disposition: A | Discharge status: Home / Self Care | Payer: Medicare HMO | Source: Ambulatory Visit | Attending: Ophthalmology | Admitting: Ophthalmology

## 2020-08-20 ENCOUNTER — Encounter (HOSPITAL_COMMUNITY): Admission: RE | Payer: Self-pay | Source: Home / Self Care

## 2020-08-20 ENCOUNTER — Ambulatory Visit (HOSPITAL_COMMUNITY): Admission: RE | Admit: 2020-08-20 | Payer: Medicare HMO | Source: Home / Self Care | Admitting: Ophthalmology

## 2020-08-20 SURGERY — PHACOEMULSIFICATION, CATARACT, WITH IOL INSERTION
Anesthesia: Monitor Anesthesia Care | Laterality: Left

## 2020-08-21 ENCOUNTER — Other Ambulatory Visit: Payer: Self-pay | Admitting: Gastroenterology

## 2020-09-06 ENCOUNTER — Inpatient Hospital Stay (HOSPITAL_COMMUNITY): Admission: RE | Admit: 2020-09-06 | Payer: Medicare HMO | Source: Ambulatory Visit

## 2020-09-08 ENCOUNTER — Other Ambulatory Visit (HOSPITAL_COMMUNITY): Payer: Medicare HMO

## 2020-09-10 ENCOUNTER — Encounter (HOSPITAL_COMMUNITY): Admission: RE | Payer: Self-pay | Source: Home / Self Care

## 2020-09-10 ENCOUNTER — Ambulatory Visit (HOSPITAL_COMMUNITY): Admission: RE | Admit: 2020-09-10 | Payer: Medicare HMO | Source: Home / Self Care | Admitting: Ophthalmology

## 2020-09-10 SURGERY — PHACOEMULSIFICATION, CATARACT, WITH IOL INSERTION
Anesthesia: Monitor Anesthesia Care | Laterality: Right

## 2020-09-14 ENCOUNTER — Encounter: Payer: Self-pay | Admitting: Internal Medicine

## 2020-09-14 ENCOUNTER — Other Ambulatory Visit: Payer: Self-pay

## 2020-09-14 ENCOUNTER — Ambulatory Visit: Payer: Medicare HMO | Admitting: Internal Medicine

## 2020-09-14 VITALS — BP 146/78 | HR 67 | Ht 67.0 in | Wt 239.0 lb

## 2020-09-14 DIAGNOSIS — I428 Other cardiomyopathies: Secondary | ICD-10-CM | POA: Diagnosis not present

## 2020-09-14 DIAGNOSIS — I5022 Chronic systolic (congestive) heart failure: Secondary | ICD-10-CM

## 2020-09-14 DIAGNOSIS — I10 Essential (primary) hypertension: Secondary | ICD-10-CM | POA: Diagnosis not present

## 2020-09-14 LAB — CUP PACEART INCLINIC DEVICE CHECK
Battery Remaining Longevity: 3 mo
Battery Voltage: 2.77 V
Brady Statistic AP VP Percent: 11.34 %
Brady Statistic AP VS Percent: 0.03 %
Brady Statistic AS VP Percent: 86.83 %
Brady Statistic AS VS Percent: 1.8 %
Brady Statistic RA Percent Paced: 10.83 %
Brady Statistic RV Percent Paced: 92.94 %
Date Time Interrogation Session: 20220301100050
HighPow Impedance: 67 Ohm
Implantable Lead Implant Date: 20150417
Implantable Lead Implant Date: 20150417
Implantable Lead Implant Date: 20150417
Implantable Lead Location: 753858
Implantable Lead Location: 753859
Implantable Lead Location: 753860
Implantable Lead Model: 4298
Implantable Lead Model: 5076
Implantable Lead Model: 6935
Implantable Pulse Generator Implant Date: 20150417
Lead Channel Impedance Value: 285 Ohm
Lead Channel Impedance Value: 304 Ohm
Lead Channel Impedance Value: 304 Ohm
Lead Channel Impedance Value: 342 Ohm
Lead Channel Impedance Value: 342 Ohm
Lead Channel Impedance Value: 361 Ohm
Lead Channel Impedance Value: 361 Ohm
Lead Channel Impedance Value: 475 Ohm
Lead Channel Impedance Value: 475 Ohm
Lead Channel Impedance Value: 475 Ohm
Lead Channel Impedance Value: 532 Ohm
Lead Channel Impedance Value: 532 Ohm
Lead Channel Impedance Value: 532 Ohm
Lead Channel Pacing Threshold Amplitude: 0.625 V
Lead Channel Pacing Threshold Amplitude: 0.75 V
Lead Channel Pacing Threshold Amplitude: 1.875 V
Lead Channel Pacing Threshold Pulse Width: 0.4 ms
Lead Channel Pacing Threshold Pulse Width: 0.4 ms
Lead Channel Pacing Threshold Pulse Width: 0.8 ms
Lead Channel Sensing Intrinsic Amplitude: 1.25 mV
Lead Channel Sensing Intrinsic Amplitude: 1.625 mV
Lead Channel Sensing Intrinsic Amplitude: 10 mV
Lead Channel Sensing Intrinsic Amplitude: 10.25 mV
Lead Channel Setting Pacing Amplitude: 2 V
Lead Channel Setting Pacing Amplitude: 2.5 V
Lead Channel Setting Pacing Amplitude: 2.5 V
Lead Channel Setting Pacing Pulse Width: 0.4 ms
Lead Channel Setting Pacing Pulse Width: 0.8 ms
Lead Channel Setting Sensing Sensitivity: 0.3 mV

## 2020-09-14 NOTE — Patient Instructions (Signed)
Medication Instructions:  Your physician recommends that you continue on your current medications as directed. Please refer to the Current Medication list given to you today.  *If you need a refill on your cardiac medications before your next appointment, please call your pharmacy*   Lab Work: NONE   If you have labs (blood work) drawn today and your tests are completely normal, you will receive your results only by: . MyChart Message (if you have MyChart) OR . A paper copy in the mail If you have any lab test that is abnormal or we need to change your treatment, we will call you to review the results.   Testing/Procedures: NONE    Follow-Up: At [facility], you and your health needs are our priority.  As part of our continuing mission to provide you with exceptional heart care, we have created designated Provider Care Teams.  These Care Teams include your primary Cardiologist (physician) and Advanced Practice Providers (APPs -  Physician Assistants and Nurse Practitioners) who all work together to provide you with the care you need, when you need it.  We recommend signing up for the patient portal called "MyChart".  Sign up information is provided on this After Visit Summary.  MyChart is used to connect with patients for Virtual Visits (Telemedicine).  Patients are able to view lab/test results, encounter notes, upcoming appointments, etc.  Non-urgent messages can be sent to your provider as well.   To learn more about what you can do with MyChart, go to [email or web address].    Your next appointment:   1 year(s)  The format for your next appointment:   In Person  Provider:   Gregg [place], MD   Other Instructions Thank you for choosing [facility]!    

## 2020-09-14 NOTE — Progress Notes (Signed)
HPI [name] returns today for followup. She is a pleasant 80 yo woman with a h/o chronic systolic heart failure, obesity, and LBBB who underwent biv ICD insertion several years ago. She is approaching ERI. She denies chest pain. She has class 2 CHF symptoms. No ICD therapies.  Allergies  Allergen Reactions  . Ciprofloxacin Cough  . Dexlansoprazole     Lower abdominal pain.  . Statins Other (See Comments)    Arthralgias during treatment with at least 2 members of his class   . Amoxicillin Rash     Current Outpatient Medications  Medication Sig Dispense Refill  . benazepril (LOTENSIN) 40 MG tablet Take 1 tablet (40 mg total) by mouth every morning. 90 tablet 1  . carvedilol (COREG) 25 MG tablet TAKE 1 TABLET BY MOUTH TWICE DAILY WITH A MEAL (Patient taking differently: Take 25 mg by mouth 2 (two) times daily with a meal. TAKE 1 TABLET BY MOUTH TWICE DAILY WITH A MEAL) 60 tablet 6  . cholecalciferol (VITAMIN D) 1000 UNITS tablet Take 2,000 Units by mouth daily.    . fexofenadine (ALLEGRA) 180 MG tablet Take 180 mg by mouth every morning.    . fluticasone (FLONASE) 50 MCG/ACT nasal spray Place 2 sprays into both nostrils daily as needed for allergies. 16 g 0  . furosemide (LASIX) 40 MG tablet Take 1 tablet (40 mg total) by mouth 2 (two) times daily. as directed 60 tablet 3  . hydrALAZINE (APRESOLINE) 25 MG tablet Take 1 tablet (25 mg total) by mouth 3 (three) times daily. 270 tablet 1  . lansoprazole (PREVACID) 30 MG capsule TAKE 1 CAPSULE BY MOUTH ONCE DAILY BEFORE BREAKFAST 90 capsule 3  . ondansetron (ZOFRAN) 4 MG tablet Take 1 tablet (4 mg total) by mouth every 8 (eight) hours as needed for nausea or vomiting. 20 tablet 0  . rivaroxaban (XARELTO) 20 MG TABS tablet TAKE 1 TABLET BY MOUTH ONCE DAILY WITH SUPPER 30 tablet 3  . SYNTHROID 125 MCG tablet Take 1 tablet by mouth once daily (Patient taking differently: Take 125 mcg by mouth daily before breakfast.) 30 tablet 11  .  traMADol (ULTRAM) 50 MG tablet Take 1 tablet (50 mg total) by mouth every 6 (six) hours as needed for moderate pain. 30 tablet 0   No current facility-administered medications for this visit.     Past Medical History:  Diagnosis Date  . Allergy   . Anemia   . Atrial fibrillation ([place])   . 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 Cataract   . Cerebrovascular accident [facility])    No clinical event; asymptomatic CT finding  . CHF (congestive heart failure) ([place])   . Chronic kidney disease   . COPD (chronic obstructive pulmonary disease) ([place])   . DDD (degenerative disc disease), cervical    Cervical  . Degenerative joint disease    Right hip and knee  . Essential hypertension   . GERD (gastroesophageal reflux disease)   . Hip dislocation, right ([place])   . Hyperlipemia   . Hypothyroid   . LBBB (left bundle branch block)   . S/P colonoscopy 2009   Dr. Sharlett Iles: reportedly normal per pt  . S/P endoscopy 1980s    ROS:   All systems reviewed and negative except as noted in the HPI.   Past Surgical History:  Procedure Laterality Date  . ABDOMINAL  HYSTERECTOMY    . APPENDECTOMY    . BI-VENTRICULAR IMPLANTABLE CARDIOVERTER DEFIBRILLATOR  (CRT-D)  10-31-2013   MDT Hillery Aldo CRTD implanted by Dr Lovena Le  . BIOPSY  12/18/2018   Procedure: BIOPSY;  Surgeon: Daneil Dolin, MD;  [facility];  Service: Endoscopy;;  Gastric   . BREAST BIOPSY     bilaterally; benign  . can not have MRI due to jaw surgery    . CHOLECYSTECTOMY    . COLONOSCOPY  2009   Dr. Sharlett Iles: Normal  . ESOPHAGOGASTRODUODENOSCOPY  07/13/2011   small hh, patchy gastric erythema with scarring deformity of antrum, chronic gastritis, bx benign with no H.Pylori. Procedure: ESOPHAGOGASTRODUODENOSCOPY (EGD);  Surgeon: Daneil Dolin, MD;  [facility];  Service: Endoscopy;  Laterality: N/A;   10:45  . ESOPHAGOGASTRODUODENOSCOPY N/A 12/18/2018   Dr. Gala Romney: normal esophagus s/p dilation, small hh, erythema in stomach with benign biopsy.   . IMPLANTABLE CARDIOVERTER DEFIBRILLATOR IMPLANT N/A 10/31/2013   Procedure: IMPLANTABLE CARDIOVERTER DEFIBRILLATOR IMPLANT;  Surgeon: Evans Lance, MD;  Location: [facility] CATH LAB;  Service: Cardiovascular;  Laterality: N/A;  . Venia Minks DILATION N/A 12/18/2018   Procedure: Venia Minks DILATION;  Surgeon: Daneil Dolin, MD;  [facility];  Service: Endoscopy;  Laterality: N/A;  . MANDIBLE SURGERY     secondary to malocclusion  . THYROIDECTOMY     neoplasm  . Ureteral Surgery  1970     Family History  Problem Relation Age of Onset  . Brain cancer Mother   . Heart disease Mother   . Hypertension Mother   . Stroke Mother   . Atrial fibrillation Other   . Stroke Brother   . Colon cancer Neg Hx      Social History   Socioeconomic History  . Marital status: Widowed    Spouse name: Not on file  . Number of children: 2  . Years of education: Not on file  . Highest education level: Not on file  Occupational History  . Occupation: retired  Tobacco Use  . Smoking status: Never Smoker  . Smokeless tobacco: Never Used  Vaping Use  . Vaping Use: Never used  Substance and Sexual Activity  . Alcohol use: No    Alcohol/week: 0.0 standard drinks  . Drug use: No  . Sexual activity: Not on file  Other Topics Concern  . Not on file  Social History Narrative   Widowed         Social Determinants of Health   Financial Resource Strain: Not on file  Food Insecurity: Not on file  Transportation Needs: Not on file  Physical Activity: Not on file  Stress: Not on file  Social Connections: Not on file  Intimate Partner Violence: Not on file     BP (!) 146/78   Pulse 67   Ht 5\' 7"  (1.702 m)   Wt 239 lb (108.4 kg)   SpO2 97%   BMI 37.43 kg/m   Physical Exam:  Well appearing NAD HEENT: Unremarkable Neck:  No JVD, no  thyromegally Lymphatics:  No adenopathy Back:  No CVA tenderness Lungs:  Clear with no wheezes HEART:  Regular rate rhythm, no murmurs, no rubs, no clicks Abd:  soft, positive bowel sounds, no organomegally, no rebound, no guarding Ext:  2 plus pulses, no edema, no cyanosis, no clubbing Skin:  No rashes no nodules Neuro:  CN II through XII intact, motor grossly intact  DEVICE  Normal device function.  See PaceArt for  details.   Assess/Plan: 1. Chronic systolic heart failure =- she has class 2 symptoms. She will continue her current meds. 2. ICD - she is approaching ERI. I plan to switch her to biv PPM at change out. 3. HTN -her bp is a bit high but always is at the doctors office. 4. Obesity - she is encouraged to lose weight.  Carleene Overlie Tyquavious Gamel,MD

## 2020-09-24 DIAGNOSIS — E039 Hypothyroidism, unspecified: Secondary | ICD-10-CM | POA: Diagnosis not present

## 2020-09-24 DIAGNOSIS — I5022 Chronic systolic (congestive) heart failure: Secondary | ICD-10-CM | POA: Diagnosis not present

## 2020-09-24 DIAGNOSIS — I482 Chronic atrial fibrillation, unspecified: Secondary | ICD-10-CM | POA: Diagnosis not present

## 2020-09-24 DIAGNOSIS — R7303 Prediabetes: Secondary | ICD-10-CM | POA: Diagnosis not present

## 2020-09-24 DIAGNOSIS — N1831 Chronic kidney disease, stage 3a: Secondary | ICD-10-CM | POA: Diagnosis not present

## 2020-09-24 DIAGNOSIS — Z0001 Encounter for general adult medical examination with abnormal findings: Secondary | ICD-10-CM | POA: Diagnosis not present

## 2020-09-24 DIAGNOSIS — E785 Hyperlipidemia, unspecified: Secondary | ICD-10-CM | POA: Diagnosis not present

## 2020-09-24 DIAGNOSIS — I10 Essential (primary) hypertension: Secondary | ICD-10-CM | POA: Diagnosis not present

## 2020-09-24 DIAGNOSIS — E559 Vitamin D deficiency, unspecified: Secondary | ICD-10-CM | POA: Diagnosis not present

## 2020-09-27 ENCOUNTER — Ambulatory Visit (INDEPENDENT_AMBULATORY_CARE_PROVIDER_SITE_OTHER): Payer: Medicare HMO

## 2020-09-27 DIAGNOSIS — I5022 Chronic systolic (congestive) heart failure: Secondary | ICD-10-CM

## 2020-09-27 DIAGNOSIS — I428 Other cardiomyopathies: Secondary | ICD-10-CM

## 2020-09-27 LAB — CUP PACEART REMOTE DEVICE CHECK
Battery Remaining Longevity: 2 mo
Battery Voltage: 2.75 V
Brady Statistic AP VP Percent: 10.45 %
Brady Statistic AP VS Percent: 0.03 %
Brady Statistic AS VP Percent: 86.8 %
Brady Statistic AS VS Percent: 2.72 %
Brady Statistic RA Percent Paced: 10.08 %
Brady Statistic RV Percent Paced: 93.19 %
Date Time Interrogation Session: 20220314101237
HighPow Impedance: 67 Ohm
Implantable Lead Implant Date: 20150417
Implantable Lead Implant Date: 20150417
Implantable Lead Implant Date: 20150417
Implantable Lead Location: 753858
Implantable Lead Location: 753859
Implantable Lead Location: 753860
Implantable Lead Model: 4298
Implantable Lead Model: 5076
Implantable Lead Model: 6935
Implantable Pulse Generator Implant Date: 20150417
Lead Channel Impedance Value: 285 Ohm
Lead Channel Impedance Value: 285 Ohm
Lead Channel Impedance Value: 285 Ohm
Lead Channel Impedance Value: 304 Ohm
Lead Channel Impedance Value: 304 Ohm
Lead Channel Impedance Value: 304 Ohm
Lead Channel Impedance Value: 342 Ohm
Lead Channel Impedance Value: 456 Ohm
Lead Channel Impedance Value: 456 Ohm
Lead Channel Impedance Value: 456 Ohm
Lead Channel Impedance Value: 475 Ohm
Lead Channel Impedance Value: 513 Ohm
Lead Channel Impedance Value: 513 Ohm
Lead Channel Pacing Threshold Amplitude: 0.625 V
Lead Channel Pacing Threshold Amplitude: 0.625 V
Lead Channel Pacing Threshold Amplitude: 1.875 V
Lead Channel Pacing Threshold Pulse Width: 0.4 ms
Lead Channel Pacing Threshold Pulse Width: 0.4 ms
Lead Channel Pacing Threshold Pulse Width: 0.8 ms
Lead Channel Sensing Intrinsic Amplitude: 1.5 mV
Lead Channel Sensing Intrinsic Amplitude: 1.5 mV
Lead Channel Sensing Intrinsic Amplitude: 8.625 mV
Lead Channel Sensing Intrinsic Amplitude: 8.625 mV
Lead Channel Setting Pacing Amplitude: 2 V
Lead Channel Setting Pacing Amplitude: 2.5 V
Lead Channel Setting Pacing Amplitude: 2.75 V
Lead Channel Setting Pacing Pulse Width: 0.4 ms
Lead Channel Setting Pacing Pulse Width: 0.8 ms
Lead Channel Setting Sensing Sensitivity: 0.3 mV

## 2020-09-28 DIAGNOSIS — N3941 Urge incontinence: Secondary | ICD-10-CM | POA: Diagnosis not present

## 2020-09-28 DIAGNOSIS — I8392 Asymptomatic varicose veins of left lower extremity: Secondary | ICD-10-CM | POA: Diagnosis not present

## 2020-09-28 DIAGNOSIS — I10 Essential (primary) hypertension: Secondary | ICD-10-CM | POA: Diagnosis not present

## 2020-09-28 DIAGNOSIS — E559 Vitamin D deficiency, unspecified: Secondary | ICD-10-CM | POA: Diagnosis not present

## 2020-09-28 DIAGNOSIS — I8391 Asymptomatic varicose veins of right lower extremity: Secondary | ICD-10-CM | POA: Diagnosis not present

## 2020-09-28 DIAGNOSIS — K219 Gastro-esophageal reflux disease without esophagitis: Secondary | ICD-10-CM | POA: Diagnosis not present

## 2020-09-28 DIAGNOSIS — I482 Chronic atrial fibrillation, unspecified: Secondary | ICD-10-CM | POA: Diagnosis not present

## 2020-09-28 DIAGNOSIS — R110 Nausea: Secondary | ICD-10-CM | POA: Diagnosis not present

## 2020-09-28 DIAGNOSIS — I8312 Varicose veins of left lower extremity with inflammation: Secondary | ICD-10-CM | POA: Diagnosis not present

## 2020-09-28 DIAGNOSIS — L578 Other skin changes due to chronic exposure to nonionizing radiation: Secondary | ICD-10-CM | POA: Diagnosis not present

## 2020-09-28 DIAGNOSIS — N1831 Chronic kidney disease, stage 3a: Secondary | ICD-10-CM | POA: Diagnosis not present

## 2020-09-28 DIAGNOSIS — L570 Actinic keratosis: Secondary | ICD-10-CM | POA: Diagnosis not present

## 2020-09-28 DIAGNOSIS — I8311 Varicose veins of right lower extremity with inflammation: Secondary | ICD-10-CM | POA: Diagnosis not present

## 2020-09-28 DIAGNOSIS — I872 Venous insufficiency (chronic) (peripheral): Secondary | ICD-10-CM | POA: Diagnosis not present

## 2020-09-28 DIAGNOSIS — G894 Chronic pain syndrome: Secondary | ICD-10-CM | POA: Diagnosis not present

## 2020-09-28 DIAGNOSIS — L821 Other seborrheic keratosis: Secondary | ICD-10-CM | POA: Diagnosis not present

## 2020-09-28 DIAGNOSIS — E039 Hypothyroidism, unspecified: Secondary | ICD-10-CM | POA: Diagnosis not present

## 2020-10-04 NOTE — Progress Notes (Signed)
Remote ICD transmission.   

## 2020-10-25 ENCOUNTER — Ambulatory Visit: Payer: Self-pay

## 2020-10-25 ENCOUNTER — Encounter: Payer: Self-pay | Admitting: Specialist

## 2020-10-25 ENCOUNTER — Other Ambulatory Visit: Payer: Self-pay

## 2020-10-25 ENCOUNTER — Ambulatory Visit: Payer: Medicare HMO | Admitting: Specialist

## 2020-10-25 VITALS — BP 182/78 | HR 72 | Ht 67.0 in | Wt 239.0 lb

## 2020-10-25 DIAGNOSIS — M7062 Trochanteric bursitis, left hip: Secondary | ICD-10-CM | POA: Diagnosis not present

## 2020-10-25 DIAGNOSIS — M25559 Pain in unspecified hip: Secondary | ICD-10-CM

## 2020-10-25 DIAGNOSIS — M25552 Pain in left hip: Secondary | ICD-10-CM | POA: Diagnosis not present

## 2020-10-25 MED ORDER — BUPIVACAINE HCL 0.5 % IJ SOLN
3.0000 mL | INTRAMUSCULAR | Status: AC | PRN
  Administered 2020-10-25: 3 mL via INTRA_ARTICULAR

## 2020-10-25 MED ORDER — METHYLPREDNISOLONE ACETATE 40 MG/ML IJ SUSP
40.0000 mg | INTRAMUSCULAR | Status: AC | PRN
  Administered 2020-10-25: 40 mg via INTRA_ARTICULAR

## 2020-10-25 NOTE — Patient Instructions (Addendum)
Plan: left hip bursitis. Usually due to overuse, lying too long on the left side or walking on uneven shoes of uneven surfaces. Well padded shoes help. Ice the hip that is suffering from bursitis, only real proven treatments are Weight loss and stretching exercise. Voltaren gel applied to the left hip 3-4 times per day is helpful.  Ice the hip 2-3 times a day 15-20 mins at a time.-3 times a day 15-20 mins at a time. Hot showers in the AM.  Injection with steroid may be of benefit. Hemp CBD capsules, amazon.com 5,000-7,000 mg per bottle, 60 capsules per bottle, take one capsule twice a day. Cane in the hand opposite the hip that is painful; the right hand to use with left leg weight bearing. If the pain in the left hip does not improve with time 1-2 weeks call and ask for [facility] my assistant and she will let me know and I would have the injection of the left hip bursa repeated with ultrasound or flouroscopy guidance. Follow-Up Instructions: No follow-ups on file.

## 2020-10-25 NOTE — Progress Notes (Signed)
Office Visit Note   Patient: [name]           Date of Birth: [birth year]           MRN: [id number] Visit Date: 10/25/2020              Requested by: Celene Squibb, MD [place],  [place] [address] PCP: Celene Squibb, MD   Assessment & Plan: Visit Diagnoses:  1. Hip pain   2. Trochanteric bursitis, left hip   Plan: left hip bursitis. Usually due to overuse, lying too long on the left side or walking on uneven shoes of uneven surfaces. Well padded shoes help. Ice the hip that is suffering from bursitis, only real proven treatments are Weight loss and stretching exercise. Voltaren gel applied to the left hip 3-4 times per day is helpful.  Ice the hip 2-3 times a day 15-20 mins at a time.-3 times a day 15-20 mins at a time. Hot showers in the AM.  Injection with steroid may be of benefit. Hemp CBD capsules, amazon.com 5,000-7,000 mg per bottle, 60 capsules per bottle, take one capsule twice a day. Cane in the hand opposite the hip that is painful; the right hand to use with left leg weight bearing. If the pain in the left hip does not improve with time 1-2 weeks call and ask for [facility] my assistant and she will let me know and I would have the injection of the left hip bursa repeated with ultrasound or flouroscopy guidance. Follow-Up Instructions: No follow-ups on file.    Follow-Up Instructions: No follow-ups on file.   Orders:  Orders Placed This Encounter  Procedures  . Large Joint Inj: L greater trochanter  . XR HIP UNILAT W OR W/O PELVIS 2-3 VIEWS LEFT   No orders of the defined types were placed in this encounter.     Procedures: Large Joint Inj: L greater trochanter on 10/25/2020 5:13 PM Indications: pain Details: 22 G 3.5 in needle, lateral approach  Arthrogram: No  Medications: 40 mg methylPREDNISolone acetate 40 MG/ML; 3 mL bupivacaine 0.5 % Outcome: tolerated well, no immediate complications Procedure, treatment alternatives, risks and  benefits explained, specific risks discussed. Consent was given by the patient. Immediately prior to procedure a time out was called to verify the correct patient, procedure, equipment, support staff and site/side marked as required. Patient was prepped and draped in the usual sterile fashion.       Clinical Data: No additional findings.   Subjective: Chief Complaint  Patient presents with  . Left Hip - Pain    Pain is along the lateral side states that it feels like something is moving in there.    80 year old female with history of left hip pain and left shoulder pain. S/P left trochanteric injections in left hip by Benjiman Core in 2017 and by Dr. Erlinda Hong in 2018 right hip [place]. She is experiencing pain in the left hip with lying on the left side and also with walking. Saw PT at [facility] and they did not think they could help her. She is experiencing  Worsening pain into the left hip and left shoulder. Has constipation and does have urgency with her urination and with her bladder.    Review of Systems  Constitutional: Negative.   HENT: Negative.   Eyes: Negative.   Respiratory: Negative.   Cardiovascular: Negative.   Gastrointestinal: Negative.   Endocrine: Negative.   Genitourinary: Negative.  Musculoskeletal: Negative.   Skin: Negative.   Allergic/Immunologic: Negative.   Neurological: Negative.   Hematological: Negative.   Psychiatric/Behavioral: Negative.      Objective: Vital Signs: BP (!) 182/78 (BP Location: Left Arm, Patient Position: Sitting)   Pulse 72   Ht 5\' 7"  (1.702 m)   Wt 239 lb (108.4 kg)   BMI 37.43 kg/m   Physical Exam Constitutional:      Appearance: She is well-developed.  HENT:     Head: Normocephalic and atraumatic.  Eyes:     Pupils: Pupils are equal, round, and reactive to light.  Pulmonary:     Effort: Pulmonary effort is normal.     Breath sounds: Normal breath sounds.  Abdominal:     General: Bowel sounds are normal.      Palpations: Abdomen is soft.  Musculoskeletal:        General: Normal range of motion.     Cervical back: Normal range of motion and neck supple.  Skin:    General: Skin is warm and dry.  Neurological:     Mental Status: She is alert and oriented to person, place, and time.  Psychiatric:        Behavior: Behavior normal.        Thought Content: Thought content normal.        Judgment: Judgment normal.     Ortho Exam  Specialty Comments:  No specialty comments available.  Imaging: No results found.   PMFS History: Patient Active Problem List   Diagnosis Date Noted  . Vitamin D deficiency 07/14/2019  . Esophageal dysphagia 12/10/2018  . RUQ pain 09/17/2017  . Right flank pain 09/17/2017  . 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 Atrial fibrillation ([place]) 11/17/2013  . Chronic systolic heart failure ([place]) 10/13/2013  . Hyponatremia 03/04/2013  . Constipation 09/13/2011  . GERD 09/13/2010  . Hypothyroidism 04/28/2009  . Hyperlipidemia 04/28/2009  . Hypertension 04/28/2009  . COPD (chronic obstructive pulmonary disease) ([place]) 04/28/2009  . LEFT BUNDLE BRANCH BLOCK 04/28/2009  . DEGENERATIVE JOINT DISEASE 04/28/2009   Past Medical History:  Diagnosis Date  . Allergy   . Anemia   . Atrial fibrillation ([place])   . 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 Cataract   . Cerebrovascular accident [facility])    No clinical event; asymptomatic CT finding  . CHF (congestive heart failure) ([place])   . Chronic kidney disease   . COPD (chronic obstructive pulmonary disease) ([place])   . DDD (degenerative disc disease), cervical    Cervical  . Degenerative joint disease    Right hip and knee  . Essential hypertension   . GERD (gastroesophageal reflux disease)   . Hip dislocation, right ([place])   . Hyperlipemia   . Hypothyroid   . LBBB  (left bundle branch block)   . S/P colonoscopy 2009   Dr. Sharlett Iles: reportedly normal per pt  . S/P endoscopy 1980s    Family History  Problem Relation Age of Onset  . Brain cancer Mother   . Heart disease Mother   . Hypertension Mother   . Stroke Mother   . Atrial fibrillation Other   . Stroke Brother   . Colon cancer Neg Hx     Past Surgical History:  Procedure Laterality Date  . ABDOMINAL HYSTERECTOMY    . APPENDECTOMY    . BI-VENTRICULAR IMPLANTABLE CARDIOVERTER DEFIBRILLATOR  (CRT-D)  10-31-2013   MDT Hillery Aldo CRTD implanted by Dr Lovena Le  . BIOPSY  12/18/2018   Procedure: BIOPSY;  Surgeon: Daneil Dolin, MD;  [facility];  Service: Endoscopy;;  Gastric   . BREAST BIOPSY     bilaterally; benign  . can not have MRI due to jaw surgery    . CHOLECYSTECTOMY    . COLONOSCOPY  2009   Dr. Sharlett Iles: Normal  . ESOPHAGOGASTRODUODENOSCOPY  07/13/2011   small hh, patchy gastric erythema with scarring deformity of antrum, chronic gastritis, bx benign with no H.Pylori. Procedure: ESOPHAGOGASTRODUODENOSCOPY (EGD);  Surgeon: Daneil Dolin, MD;  [facility];  Service: Endoscopy;  Laterality: N/A;  10:45  . ESOPHAGOGASTRODUODENOSCOPY N/A 12/18/2018   Dr. Gala Romney: normal esophagus s/p dilation, small hh, erythema in stomach with benign biopsy.   . IMPLANTABLE CARDIOVERTER DEFIBRILLATOR IMPLANT N/A 10/31/2013   Procedure: IMPLANTABLE CARDIOVERTER DEFIBRILLATOR IMPLANT;  Surgeon: Evans Lance, MD;  Location: [facility] CATH LAB;  Service: Cardiovascular;  Laterality: N/A;  . Venia Minks DILATION N/A 12/18/2018   Procedure: Venia Minks DILATION;  Surgeon: Daneil Dolin, MD;  [facility];  Service: Endoscopy;  Laterality: N/A;  . MANDIBLE SURGERY     secondary to malocclusion  . THYROIDECTOMY     neoplasm  . Ureteral Surgery  1970   Social History   Occupational History  . Occupation: retired  Tobacco Use  . Smoking status: Never Smoker  . Smokeless tobacco: Never  Used  Vaping Use  . Vaping Use: Never used  Substance and Sexual Activity  . Alcohol use: No    Alcohol/week: 0.0 standard drinks  . Drug use: No  . Sexual activity: Not on file

## 2020-10-27 DIAGNOSIS — E039 Hypothyroidism, unspecified: Secondary | ICD-10-CM | POA: Diagnosis not present

## 2020-10-28 ENCOUNTER — Ambulatory Visit (INDEPENDENT_AMBULATORY_CARE_PROVIDER_SITE_OTHER): Payer: Medicare HMO

## 2020-10-28 ENCOUNTER — Other Ambulatory Visit: Payer: Self-pay | Admitting: Cardiology

## 2020-10-28 ENCOUNTER — Other Ambulatory Visit: Payer: Self-pay | Admitting: Family Medicine

## 2020-10-28 DIAGNOSIS — I5022 Chronic systolic (congestive) heart failure: Secondary | ICD-10-CM

## 2020-10-28 DIAGNOSIS — I428 Other cardiomyopathies: Secondary | ICD-10-CM

## 2020-11-01 DIAGNOSIS — E039 Hypothyroidism, unspecified: Secondary | ICD-10-CM | POA: Diagnosis not present

## 2020-11-02 ENCOUNTER — Telehealth: Payer: Self-pay

## 2020-11-02 DIAGNOSIS — I5023 Acute on chronic systolic (congestive) heart failure: Secondary | ICD-10-CM

## 2020-11-02 LAB — CUP PACEART REMOTE DEVICE CHECK
Battery Remaining Longevity: 2 mo
Battery Voltage: 2.73 V
Brady Statistic AP VP Percent: 20.27 %
Brady Statistic AP VS Percent: 0.05 %
Brady Statistic AS VP Percent: 77.25 %
Brady Statistic AS VS Percent: 2.43 %
Brady Statistic RA Percent Paced: 18.85 %
Brady Statistic RV Percent Paced: 90.4 %
Date Time Interrogation Session: 20220415094429
HighPow Impedance: 65 Ohm
Implantable Lead Implant Date: 20150417
Implantable Lead Implant Date: 20150417
Implantable Lead Implant Date: 20150417
Implantable Lead Location: 753858
Implantable Lead Location: 753859
Implantable Lead Location: 753860
Implantable Lead Model: 4298
Implantable Lead Model: 5076
Implantable Lead Model: 6935
Implantable Pulse Generator Implant Date: 20150417
Lead Channel Impedance Value: 247 Ohm
Lead Channel Impedance Value: 285 Ohm
Lead Channel Impedance Value: 285 Ohm
Lead Channel Impedance Value: 285 Ohm
Lead Channel Impedance Value: 304 Ohm
Lead Channel Impedance Value: 342 Ohm
Lead Channel Impedance Value: 361 Ohm
Lead Channel Impedance Value: 418 Ohm
Lead Channel Impedance Value: 456 Ohm
Lead Channel Impedance Value: 475 Ohm
Lead Channel Impedance Value: 475 Ohm
Lead Channel Impedance Value: 513 Ohm
Lead Channel Impedance Value: 513 Ohm
Lead Channel Pacing Threshold Amplitude: 0.5 V
Lead Channel Pacing Threshold Amplitude: 0.75 V
Lead Channel Pacing Threshold Amplitude: 2 V
Lead Channel Pacing Threshold Pulse Width: 0.4 ms
Lead Channel Pacing Threshold Pulse Width: 0.4 ms
Lead Channel Pacing Threshold Pulse Width: 0.8 ms
Lead Channel Sensing Intrinsic Amplitude: 11.5 mV
Lead Channel Sensing Intrinsic Amplitude: 11.5 mV
Lead Channel Sensing Intrinsic Amplitude: 2.5 mV
Lead Channel Sensing Intrinsic Amplitude: 2.5 mV
Lead Channel Setting Pacing Amplitude: 2 V
Lead Channel Setting Pacing Amplitude: 2.5 V
Lead Channel Setting Pacing Amplitude: 2.5 V
Lead Channel Setting Pacing Pulse Width: 0.4 ms
Lead Channel Setting Pacing Pulse Width: 0.8 ms
Lead Channel Setting Sensing Sensitivity: 0.3 mV

## 2020-11-02 MED ORDER — FUROSEMIDE 40 MG PO TABS: 40.0000 mg | ORAL_TABLET | Freq: Two times a day (BID) | ORAL | 3 refills | Status: DC

## 2020-11-02 NOTE — Telephone Encounter (Signed)
Medication refill request for Furosemide 40 mg tablets approved and sent to [facility].

## 2020-11-05 ENCOUNTER — Telehealth: Payer: Self-pay

## 2020-11-05 NOTE — Telephone Encounter (Signed)
Successful telephone encounter to Ardis Hughs after [facility] alert received for device RRT as of 11/05/20. Patient aware secondary to alarm notification. Ms. Mailhot states she discussed RRT status and need for Bi-V PPM upgrade during last visit with Dr. Lovena Le 09/14/20. Will send to scheduling.

## 2020-11-10 ENCOUNTER — Encounter: Payer: Self-pay | Admitting: Specialist

## 2020-11-10 ENCOUNTER — Ambulatory Visit: Payer: Medicare HMO | Admitting: Specialist

## 2020-11-10 ENCOUNTER — Telehealth: Payer: Self-pay

## 2020-11-10 VITALS — BP 178/81 | HR 74 | Ht 67.0 in | Wt 239.0 lb

## 2020-11-10 DIAGNOSIS — M19012 Primary osteoarthritis, left shoulder: Secondary | ICD-10-CM | POA: Diagnosis not present

## 2020-11-10 DIAGNOSIS — M7522 Bicipital tendinitis, left shoulder: Secondary | ICD-10-CM | POA: Diagnosis not present

## 2020-11-10 DIAGNOSIS — M7062 Trochanteric bursitis, left hip: Secondary | ICD-10-CM | POA: Diagnosis not present

## 2020-11-10 DIAGNOSIS — M25559 Pain in unspecified hip: Secondary | ICD-10-CM | POA: Diagnosis not present

## 2020-11-10 DIAGNOSIS — M7552 Bursitis of left shoulder: Secondary | ICD-10-CM

## 2020-11-10 DIAGNOSIS — M25512 Pain in left shoulder: Secondary | ICD-10-CM | POA: Diagnosis not present

## 2020-11-10 DIAGNOSIS — Z95810 Presence of automatic (implantable) cardiac defibrillator: Secondary | ICD-10-CM

## 2020-11-10 DIAGNOSIS — I428 Other cardiomyopathies: Secondary | ICD-10-CM

## 2020-11-10 MED ORDER — BUPIVACAINE HCL 0.5 % IJ SOLN
3.0000 mL | INTRAMUSCULAR | Status: AC | PRN
  Administered 2020-11-10: 3 mL via INTRA_ARTICULAR

## 2020-11-10 MED ORDER — METHYLPREDNISOLONE ACETATE 40 MG/ML IJ SUSP
40.0000 mg | INTRAMUSCULAR | Status: AC | PRN
  Administered 2020-11-10: 40 mg via INTRA_ARTICULAR

## 2020-11-10 NOTE — Progress Notes (Signed)
Office Visit Note   Patient: [name]           Date of Birth: [birth year]           MRN: [id number] Visit Date: 11/10/2020              Requested by: Celene Squibb, MD [place],  [place] [address] PCP: Celene Squibb, MD   Assessment & Plan: Visit Diagnoses:  1. Primary osteoarthritis, left shoulder   2. Trochanteric bursitis, left hip   3. Hip pain   4. Bursitis of left shoulder   5. Bicipital tendonitis of shoulder, left   6. Left shoulder pain, unspecified chronicity     Plan: Avoid overhead lifting and overhead use of the arms. Do not lift greater than 10 lbs. Tylenol ES one every 6-8 hours for pain and inflamation. An appointment to see Dr. Junius Roads for an ultrasound guided left greater trochanteric bursa injection is ordered.  Continue with PT for another 2-3 weeks, then a home exercise program.  Follow-Up Instructions: No follow-ups on file.   Orders:  No orders of the defined types were placed in this encounter.  No orders of the defined types were placed in this encounter.     Procedures: Large Joint Inj: L glenohumeral on 11/10/2020 12:03 PM Indications: pain Details: 25 G 1.5 in needle, anterior approach  Arthrogram: No  Medications: 40 mg methylPREDNISolone acetate 40 MG/ML; 3 mL bupivacaine 0.5 % Outcome: tolerated well, no immediate complications  Bandaid applied.  Procedure, treatment alternatives, risks and benefits explained, specific risks discussed. Consent was given by the patient. Immediately prior to procedure a time out was called to verify the correct patient, procedure, equipment, support staff and site/side marked as required. Patient was prepped and draped in the usual sterile fashion.       Clinical Data: No additional findings.   Subjective: Chief Complaint  Patient presents with  . Left Hip - Follow-up    Says injection on 10/25/20 helped about 50%  . Left Shoulder - Pain    She would like to get her shoulder  injected today    80 year old female with history of left hip bursitis and left shoulder pain. Pain with left arm overhead use and overhead lifting. She is unable to breathe well with sleeping on her side. No numbness or paresthesias last injection of the left shoulder relieved the pain for about one year. She is having shoulder pain as much as left hip pain. Underwent left hip injection of  Her bursa over the greater trochanter 2 weeks ago but did not get substantial relief. I requested she return as the injection with a spinal needle may not have made it to the left greater trochanteric  Bursa.    Review of Systems  Constitutional: Negative.   HENT: Negative.   Eyes: Negative.   Respiratory: Negative.   Cardiovascular: Negative.   Gastrointestinal: Negative.   Endocrine: Negative.   Genitourinary: Negative.   Musculoskeletal: Negative.   Skin: Negative.   Allergic/Immunologic: Negative.   Neurological: Negative.   Hematological: Negative.   Psychiatric/Behavioral: Negative.      Objective: Vital Signs: BP (!) 178/81 (BP Location: Left Arm, Patient Position: Sitting)   Pulse 74   Ht 5\' 7"  (1.702 m)   Wt 239 lb (108.4 kg)   BMI 37.43 kg/m   Physical Exam  Left Hip Exam   Tenderness  The patient is experiencing tenderness in the greater  trochanter.  Tests  Ober: positive  Comments:  Pain with direct pressure left greater trochanter. Pain with abduction of the left hip.    Left Shoulder Exam   Tenderness  The patient is experiencing tenderness in the acromioclavicular joint and acromion.  Range of Motion  Active abduction:  150 abnormal  Passive abduction: 150  Extension: 30  External rotation: 80  Forward flexion: 150   Muscle Strength  Abduction: 5/5  Internal rotation: 5/5  External rotation: 5/5  Supraspinatus: 5/5  Subscapularis: 5/5  Biceps: 5/5   Tests  Apprehension: negative Hawkins test: negative Cross arm: positive Impingement:  negative Drop arm: negative Sulcus: absent  Comments:  Left shoulder pain is lateral upper arm above the deltoid tubercle. Drop arm is negative. Previous injection was into the left G-H joint.      Specialty Comments:  No specialty comments available.  Imaging: No results found.   PMFS History: Patient Active Problem List   Diagnosis Date Noted  . Vitamin D deficiency 07/14/2019  . Esophageal dysphagia 12/10/2018  . RUQ pain 09/17/2017  . Right flank pain 09/17/2017  . 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 Atrial fibrillation ([place]) 11/17/2013  . Chronic systolic heart failure ([place]) 10/13/2013  . Hyponatremia 03/04/2013  . Constipation 09/13/2011  . GERD 09/13/2010  . Hypothyroidism 04/28/2009  . Hyperlipidemia 04/28/2009  . Hypertension 04/28/2009  . COPD (chronic obstructive pulmonary disease) ([place]) 04/28/2009  . LEFT BUNDLE BRANCH BLOCK 04/28/2009  . DEGENERATIVE JOINT DISEASE 04/28/2009   Past Medical History:  Diagnosis Date  . Allergy   . Anemia   . Atrial fibrillation ([place])   . 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 Cataract   . Cerebrovascular accident [facility])    No clinical event; asymptomatic CT finding  . CHF (congestive heart failure) ([place])   . Chronic kidney disease   . COPD (chronic obstructive pulmonary disease) ([place])   . DDD (degenerative disc disease), cervical    Cervical  . Degenerative joint disease    Right hip and knee  . Essential hypertension   . GERD (gastroesophageal reflux disease)   . Hip dislocation, right ([place])   . Hyperlipemia   . Hypothyroid   . LBBB (left bundle branch block)   . S/P colonoscopy 2009   Dr. Sharlett Iles: reportedly normal per pt  . S/P endoscopy 1980s    Family History  Problem Relation Age of Onset  . Brain cancer Mother   . Heart disease Mother   .  Hypertension Mother   . Stroke Mother   . Atrial fibrillation Other   . Stroke Brother   . Colon cancer Neg Hx     Past Surgical History:  Procedure Laterality Date  . ABDOMINAL HYSTERECTOMY    . APPENDECTOMY    . BI-VENTRICULAR IMPLANTABLE CARDIOVERTER DEFIBRILLATOR  (CRT-D)  10-31-2013   MDT Hillery Aldo CRTD implanted by Dr Lovena Le  . BIOPSY  12/18/2018   Procedure: BIOPSY;  Surgeon: Daneil Dolin, MD;  [facility];  Service: Endoscopy;;  Gastric   . BREAST BIOPSY     bilaterally; benign  . can not have MRI due to jaw surgery    . CHOLECYSTECTOMY    . COLONOSCOPY  2009   Dr. Sharlett Iles: Normal  . ESOPHAGOGASTRODUODENOSCOPY  07/13/2011   small hh, patchy gastric erythema with scarring deformity of antrum, chronic gastritis, bx  benign with no H.Pylori. Procedure: ESOPHAGOGASTRODUODENOSCOPY (EGD);  Surgeon: Daneil Dolin, MD;  [facility];  Service: Endoscopy;  Laterality: N/A;  10:45  . ESOPHAGOGASTRODUODENOSCOPY N/A 12/18/2018   Dr. Gala Romney: normal esophagus s/p dilation, small hh, erythema in stomach with benign biopsy.   . IMPLANTABLE CARDIOVERTER DEFIBRILLATOR IMPLANT N/A 10/31/2013   Procedure: IMPLANTABLE CARDIOVERTER DEFIBRILLATOR IMPLANT;  Surgeon: Evans Lance, MD;  Location: [facility] CATH LAB;  Service: Cardiovascular;  Laterality: N/A;  . Venia Minks DILATION N/A 12/18/2018   Procedure: Venia Minks DILATION;  Surgeon: Daneil Dolin, MD;  [facility];  Service: Endoscopy;  Laterality: N/A;  . MANDIBLE SURGERY     secondary to malocclusion  . THYROIDECTOMY     neoplasm  . Ureteral Surgery  1970   Social History   Occupational History  . Occupation: retired  Tobacco Use  . Smoking status: Never Smoker  . Smokeless tobacco: Never Used  Vaping Use  . Vaping Use: Never used  Substance and Sexual Activity  . Alcohol use: No    Alcohol/week: 0.0 standard drinks  . Drug use: No  . Sexual activity: Not on file

## 2020-11-10 NOTE — Telephone Encounter (Signed)
Outreach made to Pt.  Pt scheduled for downgrade to BIV ppm from BIV ICD scheduled for December 17, 2020  Labs/covid test scheduled for June 2  Letter mailed to Pt  Work up complete

## 2020-11-10 NOTE — Telephone Encounter (Signed)
Will call to schedule gen change.

## 2020-11-10 NOTE — Patient Instructions (Signed)
Avoid overhead lifting and overhead use of the arms. Do not lift greater than 10 lbs. Tylenol ES one every 6-8 hours for pain and inflamation. An appointment to see Dr. Junius Roads for an ultrasound guided left greater trochanteric bursa injection is ordered.  Continue with PT for another 2-3 weeks, then a home exercise program.

## 2020-11-10 NOTE — Telephone Encounter (Signed)
  Successful telephone encounter to [name] after [facility] alert received for device RRT as of 11/05/20. Patient aware secondary to alarm notification. [name] states she discussed RRT status and need for Bi-V PPM upgrade during last visit with Dr. Lovena Le 09/14/20. Will send to scheduling.

## 2020-11-15 ENCOUNTER — Other Ambulatory Visit: Payer: Self-pay | Admitting: *Deleted

## 2020-11-15 MED ORDER — RIVAROXABAN 20 MG PO TABS: ORAL_TABLET | ORAL | 6 refills | Status: DC

## 2020-11-15 NOTE — Addendum Note (Signed)
Addended by: Douglass Rivers D on: 11/15/2020 10:28 AM   Modules accepted: Level of Service

## 2020-11-15 NOTE — Progress Notes (Signed)
Remote ICD transmission.   

## 2020-11-15 NOTE — Telephone Encounter (Signed)
Prescription refill request for Xarelto received.  Indication: Atrial fib Last office visit: 09/14/20 Weight: 108.4kg Age: 80 Scr:  0.99 on 09/24/20 CrCl: 77.56  Based on above findings Xarelto 20mg  daily is the appropriate dose.  Refill approved.

## 2020-11-16 ENCOUNTER — Ambulatory Visit (INDEPENDENT_AMBULATORY_CARE_PROVIDER_SITE_OTHER): Payer: Medicare HMO | Admitting: Family Medicine

## 2020-11-16 ENCOUNTER — Other Ambulatory Visit: Payer: Self-pay

## 2020-11-16 ENCOUNTER — Encounter: Payer: Self-pay | Admitting: Family Medicine

## 2020-11-16 ENCOUNTER — Ambulatory Visit: Payer: Self-pay

## 2020-11-16 DIAGNOSIS — M7062 Trochanteric bursitis, left hip: Secondary | ICD-10-CM

## 2020-11-16 NOTE — Progress Notes (Signed)
Subjective: She is here for a planned left hip greater trochanter injection under ultrasound guidance.  Objective: She is point tender over the greater trochanter.  Procedure: Ultrasound-guided left hip injection: After sterile prep with Betadine, injected 4 cc 0.25% bupivacaine and 40 mg Depo-Medrol into the gluteus medius tendon insertion on the greater trochanter at the area of maximum tenderness.

## 2020-11-23 ENCOUNTER — Other Ambulatory Visit: Payer: Self-pay | Admitting: Cardiology

## 2020-11-30 ENCOUNTER — Ambulatory Visit (INDEPENDENT_AMBULATORY_CARE_PROVIDER_SITE_OTHER): Payer: Medicare HMO

## 2020-11-30 DIAGNOSIS — I428 Other cardiomyopathies: Secondary | ICD-10-CM

## 2020-11-30 LAB — CUP PACEART REMOTE DEVICE CHECK
Battery Remaining Longevity: 1 mo — CL
Battery Voltage: 2.69 V
Brady Statistic AP VP Percent: 14.83 %
Brady Statistic AP VS Percent: 0.04 %
Brady Statistic AS VP Percent: 82.8 %
Brady Statistic AS VS Percent: 2.33 %
Brady Statistic RA Percent Paced: 13.95 %
Brady Statistic RV Percent Paced: 90.81 %
Date Time Interrogation Session: 20220517074229
HighPow Impedance: 68 Ohm
Implantable Lead Implant Date: 20150417
Implantable Lead Implant Date: 20150417
Implantable Lead Implant Date: 20150417
Implantable Lead Location: 753858
Implantable Lead Location: 753859
Implantable Lead Location: 753860
Implantable Lead Model: 4298
Implantable Lead Model: 5076
Implantable Lead Model: 6935
Implantable Pulse Generator Implant Date: 20150417
Lead Channel Impedance Value: 285 Ohm
Lead Channel Impedance Value: 285 Ohm
Lead Channel Impedance Value: 285 Ohm
Lead Channel Impedance Value: 285 Ohm
Lead Channel Impedance Value: 304 Ohm
Lead Channel Impedance Value: 304 Ohm
Lead Channel Impedance Value: 342 Ohm
Lead Channel Impedance Value: 456 Ohm
Lead Channel Impedance Value: 456 Ohm
Lead Channel Impedance Value: 456 Ohm
Lead Channel Impedance Value: 475 Ohm
Lead Channel Impedance Value: 475 Ohm
Lead Channel Impedance Value: 513 Ohm
Lead Channel Pacing Threshold Amplitude: 0.625 V
Lead Channel Pacing Threshold Amplitude: 0.75 V
Lead Channel Pacing Threshold Amplitude: 2.125 V
Lead Channel Pacing Threshold Pulse Width: 0.4 ms
Lead Channel Pacing Threshold Pulse Width: 0.4 ms
Lead Channel Pacing Threshold Pulse Width: 0.8 ms
Lead Channel Sensing Intrinsic Amplitude: 1.625 mV
Lead Channel Sensing Intrinsic Amplitude: 1.625 mV
Lead Channel Sensing Intrinsic Amplitude: 9.625 mV
Lead Channel Sensing Intrinsic Amplitude: 9.625 mV
Lead Channel Setting Pacing Amplitude: 2 V
Lead Channel Setting Pacing Amplitude: 2.5 V
Lead Channel Setting Pacing Amplitude: 2.75 V
Lead Channel Setting Pacing Pulse Width: 0.4 ms
Lead Channel Setting Pacing Pulse Width: 0.8 ms
Lead Channel Setting Sensing Sensitivity: 0.3 mV

## 2020-12-01 ENCOUNTER — Telehealth: Payer: Self-pay

## 2020-12-01 NOTE — Telephone Encounter (Signed)
-----   Message from Evans Lance, MD sent at 11/30/2020  9:24 PM EDT ----- Remote device check reviewed. Histograms appropriate. Leads and battery stable for patient. Follow up as outlined above. No recommended changes. ERI

## 2020-12-01 NOTE — Telephone Encounter (Signed)
Pt already scheduled for gen change.  No action needed.

## 2020-12-16 ENCOUNTER — Other Ambulatory Visit: Payer: Self-pay

## 2020-12-16 ENCOUNTER — Other Ambulatory Visit: Payer: Medicare HMO | Admitting: *Deleted

## 2020-12-16 ENCOUNTER — Other Ambulatory Visit (HOSPITAL_COMMUNITY): Payer: Medicare HMO

## 2020-12-16 DIAGNOSIS — Z95810 Presence of automatic (implantable) cardiac defibrillator: Secondary | ICD-10-CM

## 2020-12-16 DIAGNOSIS — I428 Other cardiomyopathies: Secondary | ICD-10-CM | POA: Diagnosis not present

## 2020-12-16 LAB — CBC WITH DIFFERENTIAL/PLATELET
Basophils Absolute: 0.1 10*3/uL (ref 0.0–0.2)
Basos: 1 %
EOS (ABSOLUTE): 0.2 10*3/uL (ref 0.0–0.4)
Eos: 2 %
Hematocrit: 37.3 % (ref 34.0–46.6)
Hemoglobin: 12.4 g/dL (ref 11.1–15.9)
Immature Grans (Abs): 0 10*3/uL (ref 0.0–0.1)
Immature Granulocytes: 1 %
Lymphocytes Absolute: 1.3 10*3/uL (ref 0.7–3.1)
Lymphs: 16 %
MCH: 29.2 pg (ref 26.6–33.0)
MCHC: 33.2 g/dL (ref 31.5–35.7)
MCV: 88 fL (ref 79–97)
Monocytes Absolute: 0.7 10*3/uL (ref 0.1–0.9)
Monocytes: 8 %
Neutrophils Absolute: 5.9 10*3/uL (ref 1.4–7.0)
Neutrophils: 72 %
Platelets: 204 10*3/uL (ref 150–450)
RBC: 4.25 x10E6/uL (ref 3.77–5.28)
RDW: 13.1 % (ref 11.7–15.4)
WBC: 8.2 10*3/uL (ref 3.4–10.8)

## 2020-12-16 LAB — BASIC METABOLIC PANEL
BUN/Creatinine Ratio: 10 — ABNORMAL LOW (ref 12–28)
BUN: 10 mg/dL (ref 8–27)
CO2: 22 mmol/L (ref 20–29)
Calcium: 9.6 mg/dL (ref 8.7–10.3)
Chloride: 102 mmol/L (ref 96–106)
Creatinine, Ser: 0.96 mg/dL (ref 0.57–1.00)
Glucose: 77 mg/dL (ref 65–99)
Potassium: 4.3 mmol/L (ref 3.5–5.2)
Sodium: 138 mmol/L (ref 134–144)
eGFR: 60 mL/min/{1.73_m2} (ref >59)

## 2020-12-16 NOTE — Pre-Procedure Instructions (Signed)
Instructed patient on the following items: Arrival time 1100 Nothing to eat or drink after midnight No meds AM of procedure Responsible person to drive you home and stay with you for 24 hrs Wash with special soap night before and morning of procedure If on anti-coagulant drug instructions Xarelto- Tuesday 5/31

## 2020-12-17 ENCOUNTER — Ambulatory Visit (HOSPITAL_COMMUNITY)
Admission: RE | Disposition: A | Discharge status: Home / Self Care | Payer: Self-pay | Source: Home / Self Care | Attending: Internal Medicine

## 2020-12-17 ENCOUNTER — Ambulatory Visit (HOSPITAL_COMMUNITY)
Admission: RE | Admit: 2020-12-17 | Discharge: 2020-12-17 | Disposition: A | Discharge status: Home / Self Care | Payer: Medicare HMO | Attending: Internal Medicine | Admitting: Internal Medicine

## 2020-12-17 DIAGNOSIS — I447 Left bundle-branch block, unspecified: Secondary | ICD-10-CM | POA: Insufficient documentation

## 2020-12-17 DIAGNOSIS — Z79899 Other long term (current) drug therapy: Secondary | ICD-10-CM | POA: Diagnosis not present

## 2020-12-17 DIAGNOSIS — Z7901 Long term (current) use of anticoagulants: Secondary | ICD-10-CM | POA: Insufficient documentation

## 2020-12-17 DIAGNOSIS — Z4502 Encounter for adjustment and management of automatic implantable cardiac defibrillator: Secondary | ICD-10-CM | POA: Insufficient documentation

## 2020-12-17 DIAGNOSIS — Z888 Allergy status to other drugs, medicaments and biological substances status: Secondary | ICD-10-CM | POA: Insufficient documentation

## 2020-12-17 DIAGNOSIS — Z881 Allergy status to other antibiotic agents status: Secondary | ICD-10-CM | POA: Diagnosis not present

## 2020-12-17 DIAGNOSIS — Z79890 Hormone replacement therapy (postmenopausal): Secondary | ICD-10-CM | POA: Insufficient documentation

## 2020-12-17 DIAGNOSIS — Z6837 Body mass index (BMI) 37.0-37.9, adult: Secondary | ICD-10-CM | POA: Diagnosis not present

## 2020-12-17 DIAGNOSIS — I5022 Chronic systolic (congestive) heart failure: Secondary | ICD-10-CM | POA: Insufficient documentation

## 2020-12-17 DIAGNOSIS — I110 Hypertensive heart disease with heart failure: Secondary | ICD-10-CM | POA: Insufficient documentation

## 2020-12-17 DIAGNOSIS — E669 Obesity, unspecified: Secondary | ICD-10-CM | POA: Diagnosis not present

## 2020-12-17 HISTORY — PX: BIV PACEMAKER INSERTION CRT-P: EP1199

## 2020-12-17 SURGERY — BIV PACEMAKER INSERTION CRT-P

## 2020-12-17 MED ORDER — VANCOMYCIN HCL IN DEXTROSE 1-5 GM/200ML-% IV SOLN
INTRAVENOUS | Status: AC
  Filled 2020-12-17: qty 200

## 2020-12-17 MED ORDER — VANCOMYCIN HCL 1000 MG/200ML IV SOLN
1000.0000 mg | INTRAVENOUS | Status: AC
  Administered 2020-12-17: 1000 mg via INTRAVENOUS
  Filled 2020-12-17: qty 200

## 2020-12-17 MED ORDER — POVIDONE-IODINE 10 % EX SWAB
2.0000 "application " | Freq: Once | CUTANEOUS | Status: AC
  Administered 2020-12-17: 2 via TOPICAL

## 2020-12-17 MED ORDER — CHLORHEXIDINE GLUCONATE 4 % EX LIQD
4.0000 "application " | Freq: Once | CUTANEOUS | Status: DC
  Filled 2020-12-17: qty 60

## 2020-12-17 MED ORDER — VANCOMYCIN HCL 1500 MG/300ML IV SOLN
1500.0000 mg | INTRAVENOUS | Status: DC
  Filled 2020-12-17: qty 300

## 2020-12-17 MED ORDER — ONDANSETRON HCL 4 MG/2ML IJ SOLN: 4.0000 mg | Freq: Four times a day (QID) | INTRAMUSCULAR | Status: DC | PRN

## 2020-12-17 MED ORDER — LIDOCAINE HCL (PF) 1 % IJ SOLN
INTRAMUSCULAR | Status: DC | PRN
  Administered 2020-12-17: 60 mL

## 2020-12-17 MED ORDER — ACETAMINOPHEN 325 MG PO TABS: 325.0000 mg | ORAL_TABLET | ORAL | Status: DC | PRN

## 2020-12-17 MED ORDER — SODIUM CHLORIDE 0.9 % IV SOLN
INTRAVENOUS | Status: AC
  Filled 2020-12-17: qty 2

## 2020-12-17 MED ORDER — SODIUM CHLORIDE 0.9 % IV SOLN
80.0000 mg | INTRAVENOUS | Status: AC
  Administered 2020-12-17: 80 mg

## 2020-12-17 MED ORDER — LIDOCAINE HCL 1 % IJ SOLN
INTRAMUSCULAR | Status: AC
  Filled 2020-12-17: qty 60

## 2020-12-17 MED ORDER — SODIUM CHLORIDE 0.9 % IV SOLN: INTRAVENOUS | Status: DC

## 2020-12-17 SURGICAL SUPPLY — 4 items
CABLE SURGICAL S-101-97-12 (CABLE) ×2 IMPLANT
DEVICE CRTP PERCEPTA QUAD MRI (Pacemaker) ×1 IMPLANT
PAD PRO RADIOLUCENT 2001M-C (PAD) ×2 IMPLANT
TRAY PACEMAKER INSERTION (PACKS) ×2 IMPLANT

## 2020-12-17 NOTE — H&P (Signed)
HPI [name] returns today for followup. She is a pleasant 80 yo woman with a h/o chronic systolic heart failure, obesity, and LBBB who underwent biv ICD insertion several years ago. She is approaching ERI. She denies chest pain. She has class 2 CHF symptoms. No ICD therapies.       Allergies  Allergen Reactions  . Ciprofloxacin Cough  . Dexlansoprazole     Lower abdominal pain.  . Statins Other (See Comments)    Arthralgias during treatment with at least 2 members of his class   . Amoxicillin Rash     Current Outpatient Medications  Medication Sig Dispense Refill  . benazepril (LOTENSIN) 40 MG tablet Take 1 tablet (40 mg total) by mouth every morning. 90 tablet 1  . carvedilol (COREG) 25 MG tablet TAKE 1 TABLET BY MOUTH TWICE DAILY WITH A MEAL (Patient taking differently: Take 25 mg by mouth 2 (two) times daily with a meal. TAKE 1 TABLET BY MOUTH TWICE DAILY WITH A MEAL) 60 tablet 6  . cholecalciferol (VITAMIN D) 1000 UNITS tablet Take 2,000 Units by mouth daily.    . fexofenadine (ALLEGRA) 180 MG tablet Take 180 mg by mouth every morning.    . fluticasone (FLONASE) 50 MCG/ACT nasal spray Place 2 sprays into both nostrils daily as needed for allergies. 16 g 0  . furosemide (LASIX) 40 MG tablet Take 1 tablet (40 mg total) by mouth 2 (two) times daily. as directed 60 tablet 3  . hydrALAZINE (APRESOLINE) 25 MG tablet Take 1 tablet (25 mg total) by mouth 3 (three) times daily. 270 tablet 1  . lansoprazole (PREVACID) 30 MG capsule TAKE 1 CAPSULE BY MOUTH ONCE DAILY BEFORE BREAKFAST 90 capsule 3  . ondansetron (ZOFRAN) 4 MG tablet Take 1 tablet (4 mg total) by mouth every 8 (eight) hours as needed for nausea or vomiting. 20 tablet 0  . rivaroxaban (XARELTO) 20 MG TABS tablet TAKE 1 TABLET BY MOUTH ONCE DAILY WITH SUPPER 30 tablet 3  . SYNTHROID 125 MCG tablet Take 1 tablet by mouth once daily (Patient taking differently: Take 125 mcg by mouth daily before  breakfast.) 30 tablet 11  . traMADol (ULTRAM) 50 MG tablet Take 1 tablet (50 mg total) by mouth every 6 (six) hours as needed for moderate pain. 30 tablet 0   No current facility-administered medications for this visit.         Past Medical History:  Diagnosis Date  . Allergy   . Anemia   . Atrial fibrillation ([place])   . 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 Cataract   . Cerebrovascular accident [facility])    No clinical event; asymptomatic CT finding  . CHF (congestive heart failure) ([place])   . Chronic kidney disease   . COPD (chronic obstructive pulmonary disease) ([place])   . DDD (degenerative disc disease), cervical    Cervical  . Degenerative joint disease    Right hip and knee  . Essential hypertension   . GERD (gastroesophageal reflux disease)   . Hip dislocation, right ([place])   . Hyperlipemia   . Hypothyroid   . LBBB (left bundle branch block)   . S/P colonoscopy 2009   Dr. Sharlett Iles: reportedly normal per pt  . S/P endoscopy 1980s    ROS:   All systems reviewed and negative except as noted in the HPI.  Past Surgical History:  Procedure Laterality Date  . ABDOMINAL HYSTERECTOMY    . APPENDECTOMY    . BI-VENTRICULAR IMPLANTABLE CARDIOVERTER DEFIBRILLATOR  (CRT-D)  10-31-2013   MDT Hillery Aldo CRTD implanted by Dr Lovena Le  . BIOPSY  12/18/2018   Procedure: BIOPSY;  Surgeon: Daneil Dolin, MD;  Location: [facility];  Service: Endoscopy;;  Gastric   . BREAST BIOPSY     bilaterally; benign  . can not have MRI due to jaw surgery    . CHOLECYSTECTOMY    . COLONOSCOPY  2009   Dr. Sharlett Iles: Normal  . ESOPHAGOGASTRODUODENOSCOPY  07/13/2011   small hh, patchy gastric erythema with scarring deformity of antrum, chronic gastritis, bx benign with no H.Pylori. Procedure: ESOPHAGOGASTRODUODENOSCOPY (EGD);   Surgeon: Daneil Dolin, MD;  Location: [facility];  Service: Endoscopy;  Laterality: N/A;  10:45  . ESOPHAGOGASTRODUODENOSCOPY N/A 12/18/2018   Dr. Gala Romney: normal esophagus s/p dilation, small hh, erythema in stomach with benign biopsy.   . IMPLANTABLE CARDIOVERTER DEFIBRILLATOR IMPLANT N/A 10/31/2013   Procedure: IMPLANTABLE CARDIOVERTER DEFIBRILLATOR IMPLANT;  Surgeon: Evans Lance, MD;  Location: [facility] CATH LAB;  Service: Cardiovascular;  Laterality: N/A;  . Venia Minks DILATION N/A 12/18/2018   Procedure: Venia Minks DILATION;  Surgeon: Daneil Dolin, MD;  Location: [facility];  Service: Endoscopy;  Laterality: N/A;  . MANDIBLE SURGERY     secondary to malocclusion  . THYROIDECTOMY     neoplasm  . Ureteral Surgery  1970          Family History  Problem Relation Age of Onset  . Brain cancer Mother   . Heart disease Mother   . Hypertension Mother   . Stroke Mother   . Atrial fibrillation Other   . Stroke Brother   . Colon cancer Neg Hx      Social History        Socioeconomic History  . Marital status: Widowed    Spouse name: Not on file  . Number of children: 2  . Years of education: Not on file  . Highest education level: Not on file  Occupational History  . Occupation: retired  Tobacco Use  . Smoking status: Never Smoker  . Smokeless tobacco: Never Used  Vaping Use  . Vaping Use: Never used  Substance and Sexual Activity  . Alcohol use: No    Alcohol/week: 0.0 standard drinks  . Drug use: No  . Sexual activity: Not on file  Other Topics Concern  . Not on file  Social History Narrative   Widowed         Social Determinants of Health   Financial Resource Strain: Not on file  Food Insecurity: Not on file  Transportation Needs: Not on file  Physical Activity: Not on file  Stress: Not on file  Social Connections: Not on file  Intimate Partner Violence: Not on file     BP (!) 146/78   Pulse 67   Ht 5\' 7"  (1.702 m)    Wt 239 lb (108.4 kg)   SpO2 97%   BMI 37.43 kg/m   Physical Exam:  Well appearing NAD HEENT: Unremarkable Neck:  No JVD, no thyromegally Lymphatics:  No adenopathy Back:  No CVA tenderness Lungs:  Clear with no wheezes HEART:  Regular rate rhythm, no murmurs, no rubs, no clicks Abd:  soft, positive bowel sounds, no organomegally, no rebound, no guarding Ext:  2 plus pulses, no edema, no cyanosis, no clubbing Skin:  No rashes no nodules  Neuro:  CN II through XII intact, motor grossly intact  DEVICE  Normal device function.  See PaceArt for details.   Assess/Plan: 1. Chronic systolic heart failure =- she has class 2 symptoms. She will continue her current meds. 2. ICD - she is approaching ERI. I plan to switch her to biv PPM at change out. 3. HTN -her bp is a bit high but always is at the doctors office. 4. Obesity - she is encouraged to lose weight.  [name]  EP Attending  Patient seen and examined. Agree with the findings as noted above. She has reached ERI and presents for gen change out. With her advanced age, lack of prior device therapies, she will undergo downgrade from a biv ICD to a biv PPM.   [name] Mary Quiett,MD

## 2020-12-17 NOTE — Discharge Instructions (Signed)

## 2020-12-17 NOTE — Progress Notes (Signed)
Pt ambulated without difficulty or bleeding.   Discharged home with daughter who will drive and stay with pt x 24 hrs 

## 2020-12-18 ENCOUNTER — Other Ambulatory Visit: Payer: Self-pay | Admitting: Cardiology

## 2020-12-20 ENCOUNTER — Telehealth: Payer: Self-pay | Admitting: Internal Medicine

## 2020-12-20 ENCOUNTER — Encounter (HOSPITAL_COMMUNITY): Payer: Self-pay | Admitting: Internal Medicine

## 2020-12-20 ENCOUNTER — Emergency Department (HOSPITAL_COMMUNITY): Payer: Medicare HMO

## 2020-12-20 ENCOUNTER — Emergency Department (HOSPITAL_COMMUNITY)
Admission: EM | Admit: 2020-12-20 | Discharge: 2020-12-20 | Disposition: A | Discharge status: Home / Self Care | Payer: Medicare HMO | Attending: Emergency Medicine | Admitting: Emergency Medicine

## 2020-12-20 DIAGNOSIS — R000 Tachycardia, unspecified: Secondary | ICD-10-CM | POA: Diagnosis not present

## 2020-12-20 DIAGNOSIS — J811 Chronic pulmonary edema: Secondary | ICD-10-CM | POA: Diagnosis not present

## 2020-12-20 DIAGNOSIS — R0689 Other abnormalities of breathing: Secondary | ICD-10-CM | POA: Diagnosis not present

## 2020-12-20 DIAGNOSIS — I110 Hypertensive heart disease with heart failure: Secondary | ICD-10-CM | POA: Insufficient documentation

## 2020-12-20 DIAGNOSIS — I5022 Chronic systolic (congestive) heart failure: Secondary | ICD-10-CM | POA: Insufficient documentation

## 2020-12-20 DIAGNOSIS — Z7901 Long term (current) use of anticoagulants: Secondary | ICD-10-CM | POA: Diagnosis not present

## 2020-12-20 DIAGNOSIS — I509 Heart failure, unspecified: Secondary | ICD-10-CM | POA: Diagnosis not present

## 2020-12-20 DIAGNOSIS — I10 Essential (primary) hypertension: Secondary | ICD-10-CM | POA: Diagnosis not present

## 2020-12-20 DIAGNOSIS — J449 Chronic obstructive pulmonary disease, unspecified: Secondary | ICD-10-CM | POA: Diagnosis not present

## 2020-12-20 DIAGNOSIS — R002 Palpitations: Secondary | ICD-10-CM | POA: Diagnosis not present

## 2020-12-20 DIAGNOSIS — I4891 Unspecified atrial fibrillation: Secondary | ICD-10-CM | POA: Diagnosis not present

## 2020-12-20 DIAGNOSIS — E876 Hypokalemia: Secondary | ICD-10-CM | POA: Diagnosis not present

## 2020-12-20 DIAGNOSIS — Z7951 Long term (current) use of inhaled steroids: Secondary | ICD-10-CM | POA: Diagnosis not present

## 2020-12-20 DIAGNOSIS — E039 Hypothyroidism, unspecified: Secondary | ICD-10-CM | POA: Diagnosis not present

## 2020-12-20 DIAGNOSIS — R0902 Hypoxemia: Secondary | ICD-10-CM | POA: Diagnosis not present

## 2020-12-20 DIAGNOSIS — Z79899 Other long term (current) drug therapy: Secondary | ICD-10-CM | POA: Diagnosis not present

## 2020-12-20 DIAGNOSIS — R001 Bradycardia, unspecified: Secondary | ICD-10-CM | POA: Diagnosis not present

## 2020-12-20 LAB — BASIC METABOLIC PANEL
Anion gap: 7 (ref 5–15)
BUN: 13 mg/dL (ref 8–23)
CO2: 30 mmol/L (ref 22–32)
Calcium: 9.2 mg/dL (ref 8.9–10.3)
Chloride: 98 mmol/L (ref 98–111)
Creatinine, Ser: 1.02 mg/dL — ABNORMAL HIGH (ref 0.44–1.00)
GFR, Estimated: 56 mL/min — ABNORMAL LOW (ref >60)
Glucose, Bld: 104 mg/dL — ABNORMAL HIGH (ref 70–99)
Potassium: 3 mmol/L — ABNORMAL LOW (ref 3.5–5.1)
Sodium: 135 mmol/L (ref 135–145)

## 2020-12-20 LAB — MAGNESIUM: Magnesium: 2 mg/dL (ref 1.7–2.4)

## 2020-12-20 LAB — TSH: TSH: 0.718 u[IU]/mL (ref 0.350–4.500)

## 2020-12-20 LAB — CBC
HCT: 39.7 % (ref 36.0–46.0)
Hemoglobin: 12.8 g/dL (ref 12.0–15.0)
MCH: 29.7 pg (ref 26.0–34.0)
MCHC: 32.2 g/dL (ref 30.0–36.0)
MCV: 92.1 fL (ref 80.0–100.0)
Platelets: 174 10*3/uL (ref 150–400)
RBC: 4.31 MIL/uL (ref 3.87–5.11)
RDW: 14.2 % (ref 11.5–15.5)
WBC: 8.7 10*3/uL (ref 4.0–10.5)
nRBC: 0 % (ref 0.0–0.2)

## 2020-12-20 LAB — BRAIN NATRIURETIC PEPTIDE: B Natriuretic Peptide: 188 pg/mL — ABNORMAL HIGH (ref 0.0–100.0)

## 2020-12-20 LAB — PHOSPHORUS: Phosphorus: 3.7 mg/dL (ref 2.5–4.6)

## 2020-12-20 LAB — TROPONIN I (HIGH SENSITIVITY): Troponin I (High Sensitivity): 14 ng/L (ref <18)

## 2020-12-20 MED ORDER — POTASSIUM CHLORIDE CRYS ER 20 MEQ PO TBCR
20.0000 meq | EXTENDED_RELEASE_TABLET | Freq: Once | ORAL | Status: AC
  Administered 2020-12-20: 20 meq via ORAL
  Filled 2020-12-20: qty 1

## 2020-12-20 MED ORDER — POTASSIUM CHLORIDE CRYS ER 20 MEQ PO TBCR: 20.0000 meq | EXTENDED_RELEASE_TABLET | Freq: Two times a day (BID) | ORAL | 0 refills | Status: DC

## 2020-12-20 NOTE — Telephone Encounter (Signed)
Patient informed that she should carefully remove outer occlusive dressing from wound site and leave steri-strips in place until her wound check on 12/28/20. She verbalized understanding of instrictions.

## 2020-12-20 NOTE — Telephone Encounter (Signed)
  1. Has your device fired? No   2. Is you device beeping? No   3. Are you experiencing draining or swelling at device site? No   4. Are you calling to see if we received your device transmission? No   5. Have you passed out? No   Patient calling to see what she need to do about the tape she just got her device   Please route to [place]

## 2020-12-20 NOTE — Discharge Instructions (Addendum)
You were seen in the ER today for your palpitations.  Your physical exam, vital signs, blood work, EKG, chest x-ray were very reassuring.  You were found to have a low potassium, you were given a potassium pill in the emergency department and prescribed potassium to take twice a day for the next 3 days.  Please follow-up closely with your cardiologist; call their office first thing tomorrow morning to schedule follow-up appointment.  Return to the ER if you develop any new chest pain, shortness of breath, palpitations, or any other new severe symptoms.

## 2020-12-20 NOTE — ED Triage Notes (Signed)
Pt brought via EMS for chest palpitations, pt stated she walked to bathroom sat down and start feeling heart racing for approx 10 minutes, recently had pacemaker place at [facility] last Friday, takes Xarelto.

## 2020-12-20 NOTE — ED Provider Notes (Signed)
[facility] EMERGENCY DEPARTMENT Provider Note   CSN: [id number] Arrival date & time: 12/20/20  1503     History Chief Complaint  Patient presents with  . Palpitations    [name] is a 80 y.o. female who presents with concern for episode of palpitations lasted 15 minutes from 1:45 up to shortly after 2:00 this afternoon.  Patient recently underwent pacemaker replacement on Friday 6/3.  Received [employer] device. Patient states that she has had worsening bilateral lower extremity edema since that time, however states that she was told to hold her Lasix for the 2 days prior to her procedure.  States she has been doubling her dose Saturday, Sunday, and today to help with the lower extremity edema.  She denies any fevers or chills at home, denies any nausea, vomiting, diarrhea, or redness and tenderness around the ICD implantation site.  She states that at this time she is asymptomatic, no longer experiencing palpitations, denies chest pain, shortness of breath.   I personally reviewed this patient's medical records.  She has history of hypothyroidism, COPD, atrial fibrillation anticoagulated on Xarelto.  Echocardiogram on 03/08/20 revealed LVEF of 55 to 60%.  HPI     Past Medical History:  Diagnosis Date  . Allergy   . Anemia   . Atrial fibrillation ([place])   . 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 Cataract   . Cerebrovascular accident [facility])    No clinical event; asymptomatic CT finding  . CHF (congestive heart failure) ([place])   . Chronic kidney disease   . COPD (chronic obstructive pulmonary disease) ([place])   . DDD (degenerative disc disease), cervical    Cervical  . Degenerative joint disease    Right hip and knee  . Essential hypertension   . GERD (gastroesophageal reflux disease)   . Hip dislocation, right ([place])   . Hyperlipemia   . Hypothyroid   .  LBBB (left bundle branch block)   . S/P colonoscopy 2009   Dr. Sharlett Iles: reportedly normal per pt  . S/P endoscopy 1980s    Patient Active Problem List   Diagnosis Date Noted  . Vitamin D deficiency 07/14/2019  . Esophageal dysphagia 12/10/2018  . RUQ pain 09/17/2017  . Right flank pain 09/17/2017  . 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 Atrial fibrillation ([place]) 11/17/2013  . Chronic systolic heart failure ([place]) 10/13/2013  . Hyponatremia 03/04/2013  . Constipation 09/13/2011  . GERD 09/13/2010  . Hypothyroidism 04/28/2009  . Hyperlipidemia 04/28/2009  . Hypertension 04/28/2009  . COPD (chronic obstructive pulmonary disease) ([place]) 04/28/2009  . LEFT BUNDLE BRANCH BLOCK 04/28/2009  . DEGENERATIVE JOINT DISEASE 04/28/2009    Past Surgical History:  Procedure Laterality Date  . ABDOMINAL HYSTERECTOMY    . APPENDECTOMY    . BI-VENTRICULAR IMPLANTABLE CARDIOVERTER DEFIBRILLATOR  (CRT-D)  10-31-2013   MDT Hillery Aldo CRTD implanted by Dr Lovena Le  . BIOPSY  12/18/2018   Procedure: BIOPSY;  Surgeon: Daneil Dolin, MD;  Location[facility] END[facility];  Service: Endoscopy;;  Gastric   . BIV PACEMAKER INSERTION CRT-P N/A 12/17/2020   Procedure: DOWNGRAD TO BIV PACEMAKER INSERTION CRT-P;  Surgeon: Evans Lance, MD;  Location: [place] CV LAB;  Service: Cardiovascular;  Laterality: N/A;  . BREAST BIOPSY     bilaterally; benign  . can not have MRI due to jaw surgery    .  CHOLECYSTECTOMY    . COLONOSCOPY  2009   Dr. Sharlett Iles: Normal  . ESOPHAGOGASTRODUODENOSCOPY  07/13/2011   small hh, patchy gastric erythema with scarring deformity of antrum, chronic gastritis, bx benign with no H.Pylori. Procedure: ESOPHAGOGASTRODUODENOSCOPY (EGD);  Surgeon: Daneil Dolin, MD;  Location: [facility] ENDO SUITE;  Service: Endoscopy;  Laterality: N/A;  10:45  . ESOPHAGOGASTRODUODENOSCOPY N/A 12/18/2018   Dr. Gala Romney: normal esophagus s/p dilation, small hh, erythema in  stomach with benign biopsy.   . IMPLANTABLE CARDIOVERTER DEFIBRILLATOR IMPLANT N/A 10/31/2013   Procedure: IMPLANTABLE CARDIOVERTER DEFIBRILLATOR IMPLANT;  Surgeon: Evans Lance, MD;  Location: [facility] CATH LAB;  Service: Cardiovascular;  Laterality: N/A;  . Venia Minks DILATION N/A 12/18/2018   Procedure: Venia Minks DILATION;  Surgeon: Daneil Dolin, MD;  Location: [facility] ENDO [facility];  Service: Endoscopy;  Laterality: N/A;  . MANDIBLE SURGERY     secondary to malocclusion  . THYROIDECTOMY     neoplasm  . Ureteral Surgery  1970     OB History    Gravida  2   Para      Term      Preterm      AB      Living        SAB      IAB      Ectopic      Multiple      Live Births              Family History  Problem Relation Age of Onset  . Brain cancer Mother   . Heart disease Mother   . Hypertension Mother   . Stroke Mother   . Atrial fibrillation Other   . Stroke Brother   . Colon cancer Neg Hx     Social History   Tobacco Use  . Smoking status: Never Smoker  . Smokeless tobacco: Never Used  Vaping Use  . Vaping Use: Never used  Substance Use Topics  . Alcohol use: No    Alcohol/week: 0.0 standard drinks  . Drug use: No    Home Medications Prior to Admission medications   Medication Sig Start Date End Date Taking? Authorizing Provider  potassium chloride SA (KLOR-CON) 20 MEQ tablet Take 1 tablet (20 mEq total) by mouth 2 (two) times daily for 3 days. 12/20/20 12/23/20 Yes Cigi Bega R, PA-C  benazepril (LOTENSIN) 40 MG tablet TAKE 1 TABLET BY MOUTH ONCE DAILY IN THE MORNING Patient taking differently: Take 40 mg by mouth daily. 11/23/20   Satira Sark, MD  carvedilol (COREG) 25 MG tablet TAKE 1 TABLET BY MOUTH TWICE DAILY WITH A MEAL Patient taking differently: Take 25 mg by mouth 2 (two) times daily with a meal. 10/29/20   Satira Sark, MD  cholecalciferol (VITAMIN D) 1000 UNITS tablet Take 2,000 Units by mouth daily.    [provider]   fexofenadine (ALLEGRA) 180 MG tablet Take 180 mg by mouth every morning.    [provider]  fluticasone (FLONASE) 50 MCG/ACT nasal spray Place 2 sprays into both nostrils daily as needed for allergies. 08/11/19   Corum, Rex Kras, MD  furosemide (LASIX) 40 MG tablet Take 1 tablet (40 mg total) by mouth 2 (two) times daily. as directed 11/02/20   Satira Sark, MD  hydrALAZINE (APRESOLINE) 25 MG tablet TAKE 1 TABLET BY MOUTH THREE TIMES DAILY 12/20/20   Satira Sark, MD  lansoprazole (PREVACID) 30 MG capsule TAKE 1 CAPSULE BY MOUTH ONCE DAILY BEFORE BREAKFAST Patient taking  differently: Take 30 mg by mouth daily before breakfast. 08/23/20   Annitta Needs, NP  ondansetron (ZOFRAN) 4 MG tablet Take 1 tablet (4 mg total) by mouth every 8 (eight) hours as needed for nausea or vomiting. 10/15/19   Corum, Rex Kras, MD  rivaroxaban (XARELTO) 20 MG TABS tablet TAKE 1 TABLET BY MOUTH ONCE DAILY WITH SUPPER Patient taking differently: Take 20 mg by mouth daily with supper. 11/15/20   Evans Lance, MD  SYNTHROID 125 MCG tablet Take 1 tablet by mouth once daily Patient taking differently: Take 125 mcg by mouth daily before breakfast. 11/06/19   Corum, Rex Kras, MD  traMADol (ULTRAM) 50 MG tablet Take 1 tablet (50 mg total) by mouth every 6 (six) hours as needed for moderate pain. Patient taking differently: Take 50 mg by mouth 2 (two) times daily. 10/28/19   Jessy Oto, MD  triamcinolone cream (KENALOG) 0.1 % Apply 1 application topically daily.    [provider]    Allergies    Ciprofloxacin, Dexlansoprazole, Statins, and Amoxicillin  Review of Systems   Review of Systems  Constitutional: Negative.   HENT: Negative.   Respiratory: Negative.   Cardiovascular: Positive for palpitations. Negative for chest pain and leg swelling.  Gastrointestinal: Negative.   Genitourinary: Negative.   Musculoskeletal: Negative.   Neurological: Negative.   Hematological: Bruises/bleeds easily.     Physical Exam Updated Vital Signs BP (!) 172/75   Pulse 68   Temp 98.4 F (36.9 C) (Oral)   Resp 18   Ht 5\' 7"  (1.702 m)   Wt 101.2 kg   SpO2 99%   BMI 34.94 kg/m   Physical Exam Vitals and nursing note reviewed.  Constitutional:      Appearance: She is obese. She is not ill-appearing or toxic-appearing.  HENT:     Head: Normocephalic and atraumatic.     Nose: Nose normal.     Mouth/Throat:     Mouth: Mucous membranes are moist.     Pharynx: Oropharynx is clear. Uvula midline. No oropharyngeal exudate or posterior oropharyngeal erythema.     Tonsils: No tonsillar exudate.  Eyes:     General: Lids are normal. Vision grossly intact.        Right eye: No discharge.        Left eye: No discharge.     Extraocular Movements: Extraocular movements intact.     Conjunctiva/sclera: Conjunctivae normal.     Pupils: Pupils are equal, round, and reactive to light.  Neck:     Trachea: Trachea and phonation normal.  Cardiovascular:     Rate and Rhythm: Normal rate and regular rhythm.     Pulses: Normal pulses.     Heart sounds: Normal heart sounds. No murmur heard.   Pulmonary:     Effort: Pulmonary effort is normal. No tachypnea, bradypnea, accessory muscle usage, prolonged expiration or respiratory distress.     Breath sounds: Normal breath sounds. No wheezing or rales.  Chest:     Chest wall: No mass, lacerations, deformity, swelling, crepitus or edema.    Abdominal:     General: Bowel sounds are normal. There is no distension.     Palpations: Abdomen is soft.     Tenderness: There is no abdominal tenderness. There is no right CVA tenderness, left CVA tenderness, guarding or rebound.  Musculoskeletal:        General: No deformity.     Cervical back: Normal range of motion and neck supple. No edema,  rigidity or crepitus. No pain with movement or spinous process tenderness.     Right lower leg: 3+ Pitting Edema present.     Left lower leg: 3+ Pitting Edema present.   Lymphadenopathy:     Cervical: No cervical adenopathy.  Skin:    General: Skin is warm and dry.     Capillary Refill: Capillary refill takes less than 2 seconds.  Neurological:     Mental Status: She is alert. Mental status is at baseline.  Psychiatric:        Mood and Affect: Mood normal.     ED Results / Procedures / Treatments   Labs (all labs ordered are listed, but only abnormal results are displayed) Labs Reviewed  BASIC METABOLIC PANEL - Abnormal; Notable for the following components:      Result Value   Potassium 3.0 (*)    Glucose, Bld 104 (*)    Creatinine, Ser 1.02 (*)    GFR, Estimated 56 (*)    All other components within normal limits  BRAIN NATRIURETIC PEPTIDE - Abnormal; Notable for the following components:   B Natriuretic Peptide 188.0 (*)    All other components within normal limits  CBC  MAGNESIUM  TSH  PHOSPHORUS  TROPONIN I (HIGH SENSITIVITY)  TROPONIN I (HIGH SENSITIVITY)   EKG EKG: paced sinus rhythm, no ischemic changes.   Radiology DG Chest 2 View  Result Date: 12/20/2020 CLINICAL DATA:  Palpitations, tachycardia, hypertension, COPD, CHF, atrial fibrillation, cardiomyopathy, pacemaker EXAM: CHEST - 2 VIEW COMPARISON:  11/01/2013 FINDINGS: LEFT subclavian ICD leads project at RIGHT atrium, RIGHT ventricle, and coronary sinus. Enlargement of cardiac silhouette with vascular congestion. Atherosclerotic calcification aorta. Lungs clear. No pulmonary infiltrate, pleural effusion or pneumothorax. Osseous structures unremarkable. IMPRESSION: Enlargement of cardiac silhouette with pulmonary vascular congestion post ICD. No acute abnormalities. Electronically Signed   By: Lavonia Dana M.D.   On: 12/20/2020 16:14   Procedures Procedures   Medications Ordered in ED Medications  potassium chloride SA (KLOR-CON) CR tablet 20 mEq (20 mEq Oral Given 12/20/20 1716)    ED Course  I have reviewed the triage vital signs and the nursing notes.  Pertinent labs &  imaging results that were available during my care of the patient were reviewed by me and considered in my medical decision making (see chart for details).    MDM Rules/Calculators/A&P                         80 year-old female who presents with concern for episode of palpitations last 10 to 15 minutes today.  Differential diagnosis for the patient's palpitations includes but is limited to dysrhythmia AV block, pacer error, metabolic derangement, anxiety, drug, anemia, sepsis.  Hypertensive on intake, vitals otherwise normal.  Cardiopulmonary exam is normal, abdominal exam is benign.  Patient does have 3+ pitting lower extremity edema bilaterally, history of lymphedema on Lasix.  EKG with sinus rhythm, paced, without ischemic changes.  CBC unremarkable, BMP with hypokalemia of 3.0, otherwise unremarkable.  BNP mildly elevated to 188, magnesium and phosphorus are normal, troponin is negative.  TSH is normal, chest x-ray without any acute abnormality.  Given reassuring work-up, physical exam, and vital signs, no further work-up is warranted in ED at this time.  Patient remains asymptomatic and is eager to be discharged home.  She is well-appearing.  Recommend close follow-up with cardiologist outpatient, also replete potassium orally here and in the outpatient setting for the next 3 days.  Jevaeh voiced understanding of her medical evaluation and treatment plan.  Each of her questions was answered to her expressed satisfaction.  Return precautions were given.  Patient is well-appearing, stable, and appropriate for discharge at this time.  This chart was dictated using voice recognition software, Dragon. Despite the best efforts of this provider to proofread and correct errors, errors may still occur which can change documentation meaning.  Final Clinical Impression(s) / ED Diagnoses Final diagnoses:  Palpitations  Hypokalemia    Rx / DC Orders ED Discharge Orders         Ordered    potassium  chloride SA (KLOR-CON) 20 MEQ tablet  2 times daily        12/20/20 1713           Keslie Gritz, Gypsy Balsam, PA-C 12/20/20 1727    Carmin Muskrat, MD 12/20/20 2314

## 2020-12-21 MED FILL — Lidocaine HCl Local Inj 1%: INTRAMUSCULAR | Qty: 60 | Status: AC

## 2020-12-22 ENCOUNTER — Encounter (HOSPITAL_COMMUNITY): Payer: Self-pay | Admitting: Internal Medicine

## 2020-12-22 NOTE — Progress Notes (Signed)
Remote ICD transmission.   

## 2020-12-24 ENCOUNTER — Telehealth: Payer: Self-pay | Admitting: *Deleted

## 2020-12-24 NOTE — Telephone Encounter (Signed)
   [facility] Pre-operative Risk Assessment    Patient Name: [name]  DOB: [birth year]  MRN: [id number]   HEARTCARE STAFF: - Please ensure there is not already an duplicate clearance open for this procedure. - Under Visit Info/Reason for Call, type in Other and utilize the format Clearance MM/DD/YY or Clearance TBD. Do not use dashes or single digits. - If request is for dental extraction, please clarify the # of teeth to be extracted. - If the patient is currently at the dentist's office, call Pre-Op APP to address. If the patient is not currently in the dentist office, please route to the Pre-Op pool  Request for surgical clearance: PER CLEARANCE IF FOR CATARACT SURGERY TO PLEASE FAX OVER RECENT EKG, CXR THAT HAVE BEEN DONE IN THE LAST 6 MONTHS AS TO ASSIST ANESTHESIA DEPT FROM DUPLICATING TESTING; WILL HAND THIS FORM OVER TO OUR HIM DEPT TO PLEASE FAX REQUESTED ITEMS TO SURGEONS OFFICE  What type of surgery is being performed? CATARACT SURGERY   When is this surgery scheduled? 02/21/21   What type of clearance is required (medical clearance vs. Pharmacy clearance to hold med vs. Both)? BOTH  Are there any medications that need to be held prior to surgery and how long? [facility]    Practice name and name of physician performing surgery? [facility]; DR. Marisa Hua   What is the office phone number? [phone] x [phone] JOANNE   7.   What is the office fax number? [phone] ATTN: JOANNE  8.   Anesthesia type (None, local, MAC, general) ? NOT LISTED    Julaine Hua 12/24/2020, 4:00 PM  _________________________________________________________________   (provider comments below)

## 2020-12-27 NOTE — Telephone Encounter (Signed)
   Patient Name: [name]  MRN: [id number]  DOB: 11/15/ 1942  Primary Cardiologist: Rozann Lesches, MD  Chart reviewed as part of pre-operative protocol coverage. Cataract extractions are recognized in guidelines as low risk surgeries that do not typically require specific preoperative testing or holding of blood thinner therapy. Therefore, given past medical history and time since last visit, based on ACC/AHA guidelines, [name] would be at acceptable risk for the planned procedure without further cardiovascular testing.   Our team has faxed CXR labs performed with our office within the last 6 months.   I will route this recommendation to the requesting party via Epic fax function and remove from pre-op pool.  Please call with questions.  Kathyrn Drown, NP 12/27/2020, 12:50 PM

## 2020-12-27 NOTE — Telephone Encounter (Signed)
Patient with diagnosis of A Fib on Xarelto for anticoagulation.    Procedure: cataract surgery Date of procedure: 02/21/21   Per office protocol, no need to hold Xarelto for cataract surgery

## 2020-12-27 NOTE — Telephone Encounter (Signed)
clearance request was handed off to our HIM Dept last week who will fax the notes to the surgeon's office.

## 2020-12-27 NOTE — Telephone Encounter (Signed)
Per surgical request form, please fax over last EKG and CXR if within the last 6 months.   Thank you

## 2020-12-28 ENCOUNTER — Other Ambulatory Visit: Payer: Self-pay

## 2020-12-28 ENCOUNTER — Ambulatory Visit (INDEPENDENT_AMBULATORY_CARE_PROVIDER_SITE_OTHER): Payer: Medicare HMO | Admitting: Emergency Medicine

## 2020-12-28 DIAGNOSIS — I5022 Chronic systolic (congestive) heart failure: Secondary | ICD-10-CM | POA: Diagnosis not present

## 2020-12-28 LAB — CUP PACEART INCLINIC DEVICE CHECK
Battery Remaining Longevity: 105 mo
Battery Voltage: 3.2 V
Brady Statistic AP VP Percent: 45.93 %
Brady Statistic AP VS Percent: 0.26 %
Brady Statistic AS VP Percent: 43.47 %
Brady Statistic AS VS Percent: 10.34 %
Brady Statistic RA Percent Paced: 52.68 %
Brady Statistic RV Percent Paced: 56.12 %
Date Time Interrogation Session: 20220614101200
Implantable Lead Implant Date: 20150417
Implantable Lead Implant Date: 20150417
Implantable Lead Implant Date: 20150417
Implantable Lead Location: 753858
Implantable Lead Location: 753859
Implantable Lead Location: 753860
Implantable Lead Model: 4298
Implantable Lead Model: 5076
Implantable Lead Model: 6935
Implantable Pulse Generator Implant Date: 20220603
Lead Channel Impedance Value: 228 Ohm
Lead Channel Impedance Value: 247 Ohm
Lead Channel Impedance Value: 266 Ohm
Lead Channel Impedance Value: 266 Ohm
Lead Channel Impedance Value: 266 Ohm
Lead Channel Impedance Value: 285 Ohm
Lead Channel Impedance Value: 323 Ohm
Lead Channel Impedance Value: 323 Ohm
Lead Channel Impedance Value: 418 Ohm
Lead Channel Impedance Value: 437 Ohm
Lead Channel Impedance Value: 456 Ohm
Lead Channel Impedance Value: 456 Ohm
Lead Channel Impedance Value: 475 Ohm
Lead Channel Impedance Value: 551 Ohm
Lead Channel Pacing Threshold Amplitude: 0.75 V
Lead Channel Pacing Threshold Amplitude: 0.75 V
Lead Channel Pacing Threshold Amplitude: 1.5 V
Lead Channel Pacing Threshold Pulse Width: 0.4 ms
Lead Channel Pacing Threshold Pulse Width: 0.4 ms
Lead Channel Pacing Threshold Pulse Width: 0.6 ms
Lead Channel Sensing Intrinsic Amplitude: 1.375 mV
Lead Channel Sensing Intrinsic Amplitude: 9.5 mV
Lead Channel Setting Pacing Amplitude: 1.5 V
Lead Channel Setting Pacing Amplitude: 2 V
Lead Channel Setting Pacing Amplitude: 2.25 V
Lead Channel Setting Pacing Pulse Width: 0.4 ms
Lead Channel Setting Pacing Pulse Width: 0.6 ms
Lead Channel Setting Sensing Sensitivity: 0.9 mV

## 2020-12-28 NOTE — Progress Notes (Signed)
Wound check appointment. Steri-strips removed. Wound without redness or edema. Incision edges approximated, wound well healed. Normal device function. Thresholds, sensing, and impedances consistent with implant measurements. Device programmed at 3.5V/auto capture in LV for extra safety margin,  with chronic settings due to mature leads in RA/RV. Histogram distribution appropriate for patient and level of activity. No mode switches or high ventricular rates noted. Patient educated about wound care, arm mobility, lifting restrictions. ROV with Dr Dr Lovena Le on 03/23/21. Enrolled in remote f/u and next remote 03/22/21.

## 2020-12-29 ENCOUNTER — Ambulatory Visit: Payer: Medicare HMO | Admitting: Specialist

## 2021-01-02 IMAGING — RF ESOPHAGUS/BARIUM SWALLOW/TABLET STUDY
9 of 12 series · 14 of 21 positions shown · non-contrast
Comparison: None.

CLINICAL DATA: 77-year-old female with history of esophageal
dilatation a couple weeks ago. Dysphagia, choking on solids and
liquids.

EXAM:
ESOPHOGRAM / BARIUM SWALLOW / BARIUM TABLET STUDY
TECHNIQUE: Combined double contrast and single contrast examination performed
using effervescent crystals, thick barium liquid, and thin barium
liquid. The patient was observed with fluoroscopy swallowing a 13 mm
barium sulphate tablet.
FLUOROSCOPY TIME:  Fluoroscopy Time:  1 minutes and 24 seconds
Radiation Exposure Index (if provided by the fluoroscopic device):
14.8 mGy
Number of Acquired Spot Images: 0

[Series 1: fluoro_barium 2fps_bw · 0.17mm/px · 1 of 1 slices shown (1 of 5)]
[im 1/1]
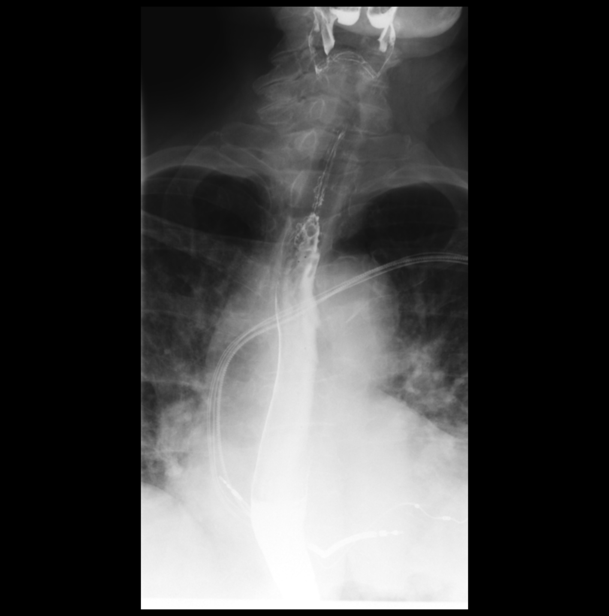

[Series 3: fluoro_barium 2fps_bw · 0.17mm/px · 1 of 1 slices shown (2 of 5)]
[im 1/1]
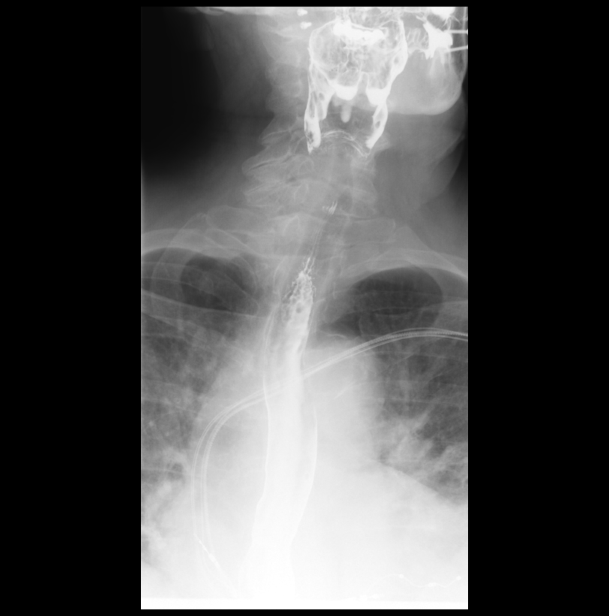

[Series 4: fluoro_barium 2fps_bw · 0.17mm/px · 1 of 1 slices shown (3 of 5)]
[im 1/1]
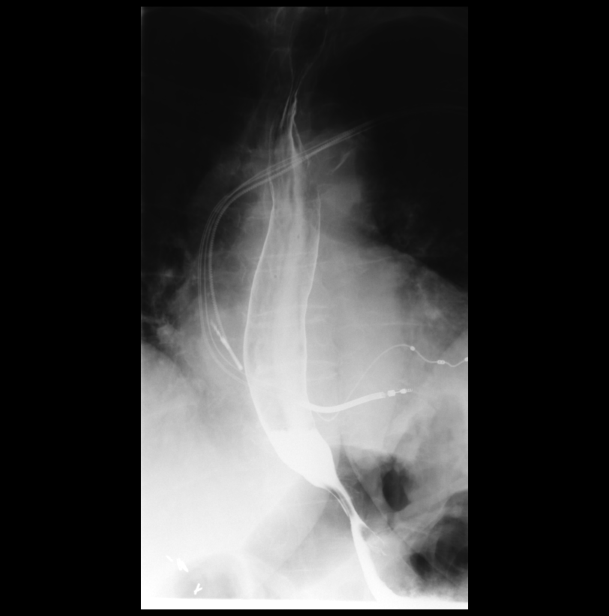

[Series 5: cp_standard · 0.25mm/px · 2 of 24 frames shown (1 of 4)]
[frame 9/24]
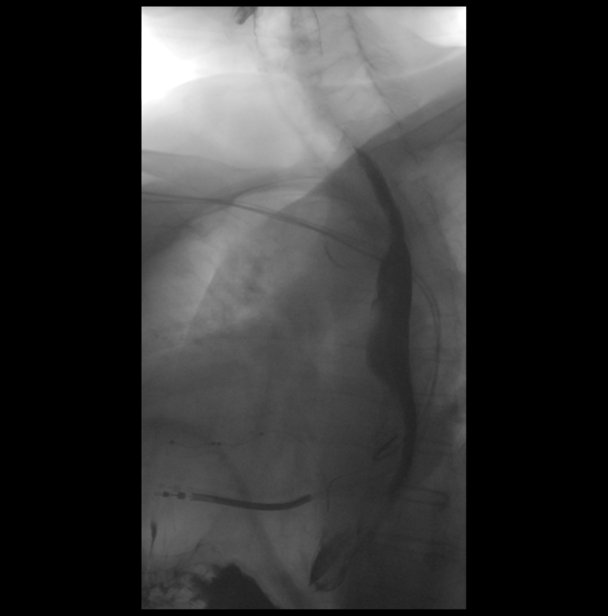
[frame 13/24]
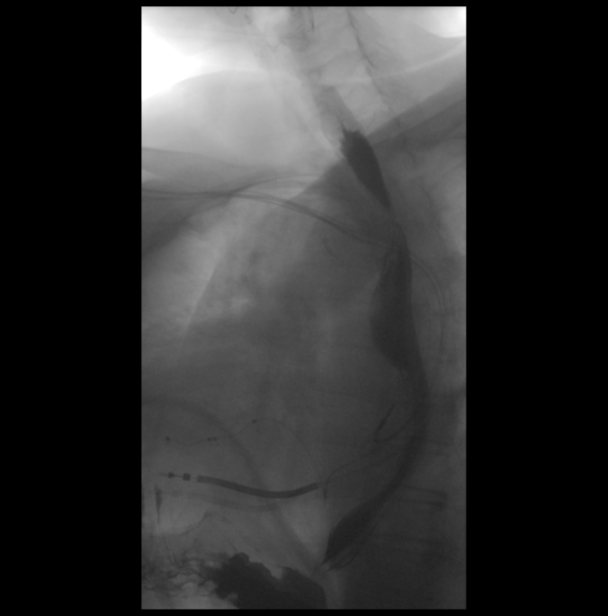

[Series 6: cp_standard · 0.25mm/px · 3 of 33 frames shown (2 of 4)]
[frame 5/33]
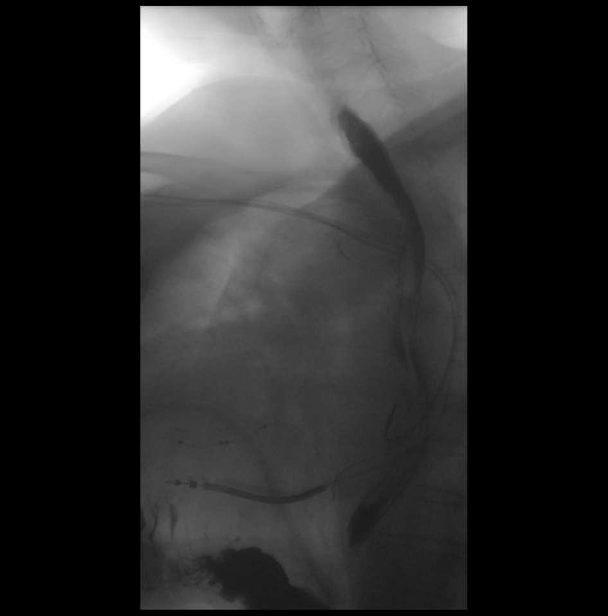
[frame 17/33]
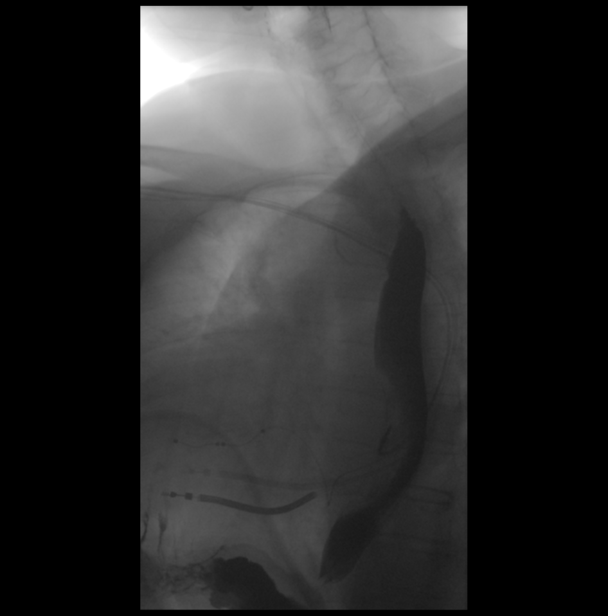
[frame 29/33]
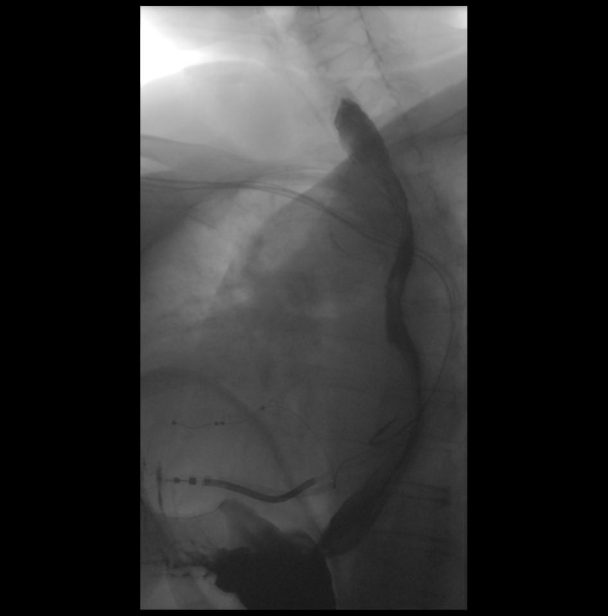

[Series 7: fluoro_barium 2fps_bw · 0.17mm/px · 1 of 1 slices shown (4 of 5)]
[im 1/1]
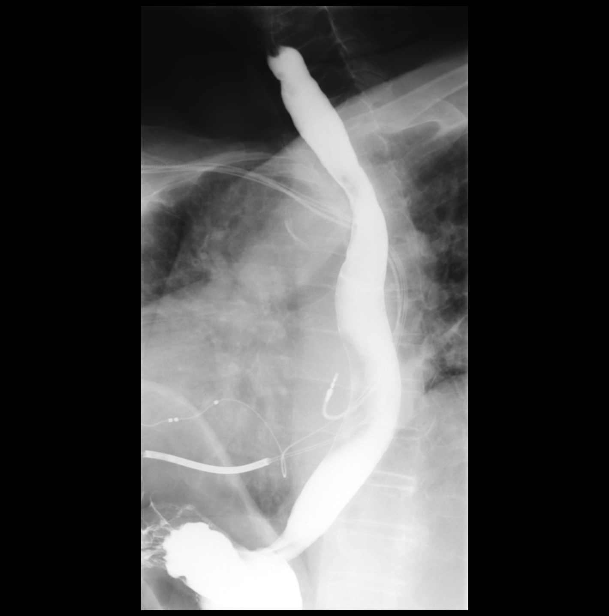

[Series 9: fluoro_barium 2fps_bw · 0.17mm/px · 1 of 1 slices shown (5 of 5)]
[im 1/1]
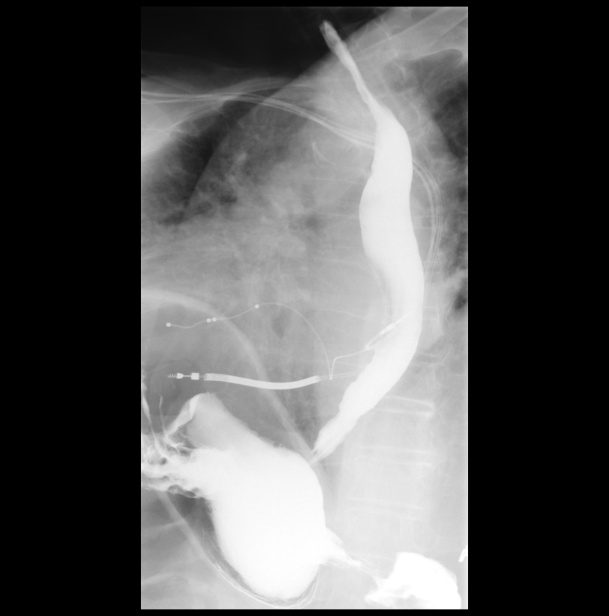

[Series 10: cp_standard · 0.26mm/px · 3 of 45 frames shown (3 of 4)]
[frame 7/45]
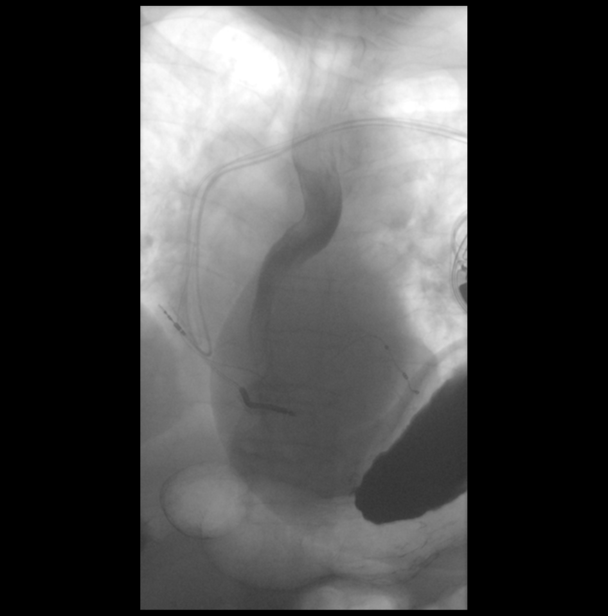
[frame 39/45]
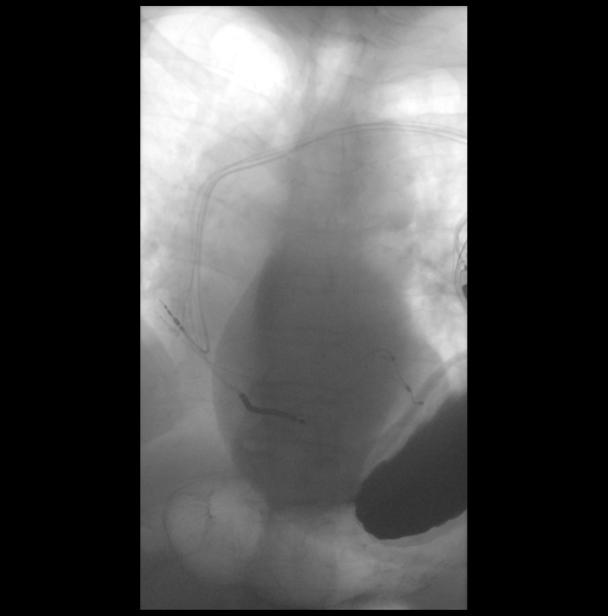
[frame 44/45]
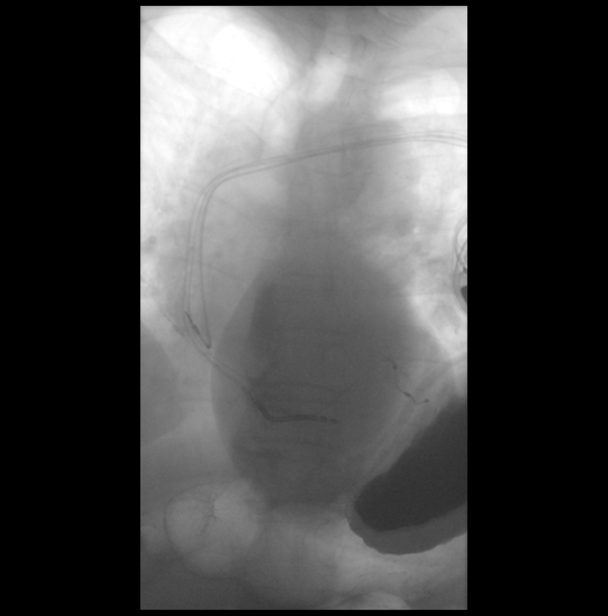

[Series 12: cp_standard · 0.25mm/px · 1 of 1 slices shown (4 of 4)]
[im 1/1]
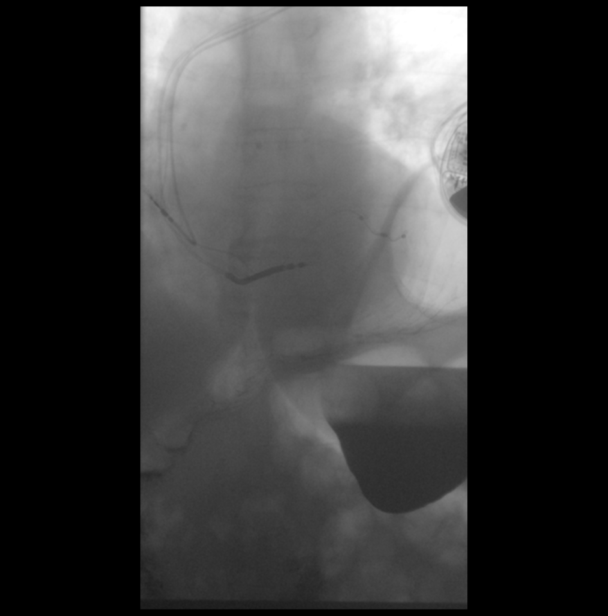

[14 of 21 positions shown; findings below may reference images not displayed]

FINDINGS: Double contrast images demonstrated a normal appearance of the
esophageal mucosa. Multiple single swallow attempts were observed,
which demonstrated failure to propagate many primary peristaltic
waves. Extensive tertiary contractions were noted. Full column
esophagram demonstrated no esophageal mass, stricture or esophageal
ring. No hiatal hernia. Water siphon test demonstrated no
gastroesophageal reflux. A barium tablet was administered, which
passed readily into the stomach.
IMPRESSION: 1. Nonspecific esophageal motility disorder with extensive tertiary
contractions.
2. Structurally normal esophagus.

## 2021-01-05 ENCOUNTER — Telehealth: Payer: Self-pay

## 2021-01-05 NOTE — Telephone Encounter (Signed)
Spoke with pt, she reports thumping sensation that she can see near her heart/ upper abdomen.  It has occurred at different times of day, lasting for 5-10 minutes but then stops.  Advised it sounds like PNS and typically changing position will stop it, however we can test/ reprogram it.  Scheduled appt for pt in device clinic with industry support for testing.

## 2021-01-05 NOTE — Telephone Encounter (Signed)
Patient called in stating her heart is doing the same thing as before she is feeling a thump in her chest and she thinks something may be wrong. I have walked patient through sending a transmission and she is having issues with the phone they gave her. Patient states no chest pains but the thumping lasts about 5-10 mins and then goes away and it has happened everyday.

## 2021-01-06 ENCOUNTER — Other Ambulatory Visit: Payer: Self-pay

## 2021-01-06 ENCOUNTER — Ambulatory Visit (INDEPENDENT_AMBULATORY_CARE_PROVIDER_SITE_OTHER): Payer: Medicare HMO

## 2021-01-06 DIAGNOSIS — I5022 Chronic systolic (congestive) heart failure: Secondary | ICD-10-CM

## 2021-01-06 LAB — CUP PACEART INCLINIC DEVICE CHECK
Date Time Interrogation Session: 20220623155834
Implantable Lead Implant Date: 20150417
Implantable Lead Implant Date: 20150417
Implantable Lead Implant Date: 20150417
Implantable Lead Location: 753858
Implantable Lead Location: 753859
Implantable Lead Location: 753860
Implantable Lead Model: 4298
Implantable Lead Model: 5076
Implantable Lead Model: 6935
Implantable Pulse Generator Implant Date: 20220603

## 2021-01-06 NOTE — Progress Notes (Signed)
Patient checked in device clinic by industry. See scanned report for changes to session. Changes reviewed by Dr. Lovena Le.  Patient seen for PNS. Programmed device from LV3 to can to LV1 to LV4.

## 2021-01-11 ENCOUNTER — Other Ambulatory Visit: Payer: Self-pay | Admitting: Internal Medicine

## 2021-02-11 DIAGNOSIS — E039 Hypothyroidism, unspecified: Secondary | ICD-10-CM | POA: Diagnosis not present

## 2021-02-11 DIAGNOSIS — R7303 Prediabetes: Secondary | ICD-10-CM | POA: Diagnosis not present

## 2021-02-11 DIAGNOSIS — E785 Hyperlipidemia, unspecified: Secondary | ICD-10-CM | POA: Diagnosis not present

## 2021-02-14 DIAGNOSIS — H01001 Unspecified blepharitis right upper eyelid: Secondary | ICD-10-CM | POA: Diagnosis not present

## 2021-02-14 DIAGNOSIS — H25812 Combined forms of age-related cataract, left eye: Secondary | ICD-10-CM | POA: Diagnosis not present

## 2021-02-14 DIAGNOSIS — H02831 Dermatochalasis of right upper eyelid: Secondary | ICD-10-CM | POA: Diagnosis not present

## 2021-02-14 DIAGNOSIS — H35373 Puckering of macula, bilateral: Secondary | ICD-10-CM | POA: Diagnosis not present

## 2021-02-18 DIAGNOSIS — I429 Cardiomyopathy, unspecified: Secondary | ICD-10-CM | POA: Diagnosis not present

## 2021-02-18 DIAGNOSIS — G894 Chronic pain syndrome: Secondary | ICD-10-CM | POA: Diagnosis not present

## 2021-02-18 DIAGNOSIS — I10 Essential (primary) hypertension: Secondary | ICD-10-CM | POA: Diagnosis not present

## 2021-02-18 DIAGNOSIS — E785 Hyperlipidemia, unspecified: Secondary | ICD-10-CM | POA: Diagnosis not present

## 2021-02-18 DIAGNOSIS — I5022 Chronic systolic (congestive) heart failure: Secondary | ICD-10-CM | POA: Diagnosis not present

## 2021-02-18 DIAGNOSIS — E039 Hypothyroidism, unspecified: Secondary | ICD-10-CM | POA: Diagnosis not present

## 2021-02-18 DIAGNOSIS — R7303 Prediabetes: Secondary | ICD-10-CM | POA: Diagnosis not present

## 2021-02-18 DIAGNOSIS — I482 Chronic atrial fibrillation, unspecified: Secondary | ICD-10-CM | POA: Diagnosis not present

## 2021-02-18 DIAGNOSIS — N1831 Chronic kidney disease, stage 3a: Secondary | ICD-10-CM | POA: Diagnosis not present

## 2021-03-23 ENCOUNTER — Other Ambulatory Visit: Payer: Self-pay

## 2021-03-23 ENCOUNTER — Ambulatory Visit: Payer: Medicare HMO | Admitting: Internal Medicine

## 2021-03-23 VITALS — BP 186/94 | HR 54 | Ht 67.0 in | Wt 240.8 lb

## 2021-03-23 DIAGNOSIS — I5022 Chronic systolic (congestive) heart failure: Secondary | ICD-10-CM | POA: Diagnosis not present

## 2021-03-23 MED ORDER — CARVEDILOL 25 MG PO TABS: 37.5000 mg | ORAL_TABLET | Freq: Two times a day (BID) | ORAL | 3 refills | Status: DC

## 2021-03-23 NOTE — H&P (Signed)
Surgical History & Physical  Patient Name: [name] DOB: Dec 26, 1940  Surgery: Cataract extraction with intraocular lens implant phacoemulsification; Left Eye  Surgeon: Baruch Goldmann MD Surgery Date:  03/28/2021 Pre-Op Date:  03/23/2021  HPI: A [name] old female patient is here for cataract re-eval. (previously referred by Dr Jorja Loa) 1. The patient complains of difficulty when driving due to glare from headlights or sun, which began 2 years ago. Both eyes are affected. The episode is gradual. The condition's severity increased since last visit. Symptoms occur when the patient is driving, inside and outside. HPI Completed by Dr. Baruch Goldmann  Medical History: Cataracts Heart Problem High Blood Pressure Mini stroke (2015) Stroke Thyroid Problems  Review of Systems Negative Allergic/Immunologic Negative Cardiovascular Negative Constitutional Negative Ear, Nose, Mouth & Throat Negative Endocrine Negative Eyes Negative Gastrointestinal Negative Genitourinary Negative Hemotologic/Lymphatic Negative Integumentary Negative Musculoskeletal Negative Neurological Negative Psychiatry Negative Respiratory  Social   Never smoked   Medication TheraTears,  Allegra, Benazepril, Carvedilol, Centrum Silver, Clonidine, Flonase, Furosemide, Prevacid, Spironolactone, Synthroid, Tramadol, Vitamin D, Xarelto, Hydralazine, Ondansetron, Lansoprazole,   Sx/Procedures Hystectomy, Thyroid Removal, Gallbladder Removal, Appendectomy, Right breast biopsy, Jaw Sx,   Drug Allergies  Cipro, Delansoprozale, Statins,  History & Physical: Heent:  Cataract, Left eye NECK: supple without bruits LUNGS: lungs clear to auscultation CV: regular rate and rhythm Abdomen: soft and non-tender  Impression & Plan: Assessment: 1.  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  DERMATOCHALASIS, no surgery; Right Upper Lid, Left Upper Lid (H02.831,  H02.834) 4.  BLEPHARITIS; Right Upper Lid, Right Lower Lid, Left Upper Lid, Left Lower Lid (H01.001, H01.002,H01.004,H01.005)  Plan: 1.  Cataract accounts for the patient's decreased vision. This visual impairment is not correctable with a tolerable change in glasses or contact lenses. Cataract surgery with an implantation of a new lens should significantly improve the visual and functional status of the patient. Discussed all risks, benefits, alternatives, and potential complications. Discussed the procedures and recovery. Patient desires to have surgery. A-scan ordered and performed today for intra-ocular lens calculations. The surgery will be performed in order to improve vision for driving, reading, and for eye examinations. Recommend phacoemulsification with intra-ocular lens. Recommend Dextenza for post-operative pain and inflammation. Left Eye worse - first. Dilates well - shugarcaine by protocol.  2.  Asymptomatic. Stable. Findings, prognosis and treatment options reviewed. Despite presence of pucker patient would like to observe for now. Symptomatic.  3.  Asymptomatic, recommend observation for now. Findings, prognosis and treatment options reviewed.  4.  Begin lid scrubs.

## 2021-03-23 NOTE — Progress Notes (Signed)
HPI [name] returns today for followup. She is a pleasant 80 yo woman with a h/o chronic systolic heart failure, obesity, and LBBB who underwent biv ICD insertion several years ago. She reached ERI and underwent gen change with downgrade to a BIV PPM. She denies chest pain. She has class 2 CHF symptoms. She has lymphedema. She denies non-compliance Allergies  Allergen Reactions   Ciprofloxacin Cough   Dexlansoprazole     Lower abdominal pain.   Statins Other (See Comments)    Arthralgias during treatment with at least 2 members of his class    Amoxicillin Rash     Current Outpatient Medications  Medication Sig Dispense Refill   benazepril (LOTENSIN) 40 MG tablet TAKE 1 TABLET BY MOUTH ONCE DAILY IN THE MORNING (Patient taking differently: Take 40 mg by mouth daily.) 90 tablet 1   carvedilol (COREG) 25 MG tablet TAKE 1 TABLET BY MOUTH TWICE DAILY WITH A MEAL (Patient taking differently: Take 25 mg by mouth 2 (two) times daily with a meal.) 180 tablet 3   cholecalciferol (VITAMIN D) 1000 UNITS tablet Take 1,000 Units by mouth daily.     fexofenadine (ALLEGRA) 180 MG tablet Take 180 mg by mouth every morning.     fluticasone (FLONASE) 50 MCG/ACT nasal spray Place 2 sprays into both nostrils daily as needed for allergies. 16 g 0   furosemide (LASIX) 40 MG tablet Take 1 tablet (40 mg total) by mouth 2 (two) times daily. as directed 60 tablet 3   hydrALAZINE (APRESOLINE) 25 MG tablet TAKE 1 TABLET BY MOUTH THREE TIMES DAILY 270 tablet 1   lansoprazole (PREVACID) 30 MG capsule TAKE 1 CAPSULE BY MOUTH ONCE DAILY BEFORE BREAKFAST (Patient taking differently: Take 30 mg by mouth daily before breakfast.) 90 capsule 3   ondansetron (ZOFRAN) 4 MG tablet Take 1 tablet (4 mg total) by mouth every 8 (eight) hours as needed for nausea or vomiting. 20 tablet 0   rivaroxaban (XARELTO) 20 MG TABS tablet TAKE 1 TABLET BY MOUTH ONCE DAILY WITH SUPPER (Patient taking differently: Take 20 mg by mouth  daily with supper.) 30 tablet 6   SYNTHROID 125 MCG tablet Take 1 tablet by mouth once daily (Patient taking differently: Take 125 mcg by mouth daily before breakfast.) 30 tablet 11   traMADol (ULTRAM) 50 MG tablet Take 1 tablet (50 mg total) by mouth every 6 (six) hours as needed for moderate pain. (Patient taking differently: Take 50 mg by mouth every 12 (twelve) hours as needed for moderate pain or severe pain.) 30 tablet 0   triamcinolone cream (KENALOG) 0.1 % Apply 1 application topically daily as needed (Breakout on legs).     potassium chloride SA (KLOR-CON) 20 MEQ tablet Take 1 tablet (20 mEq total) by mouth 2 (two) times daily for 3 days. (Patient not taking: Reported on 03/22/2021) 6 tablet 0   No current facility-administered medications for this visit.     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 Sharlett Iles: reportedly normal per pt   S/P endoscopy 1980s    ROS:   All systems reviewed and negative except as noted in the HPI.   Past Surgical History:  Procedure Laterality Date   ABDOMINAL HYSTERECTOMY     APPENDECTOMY     BI-VENTRICULAR IMPLANTABLE CARDIOVERTER DEFIBRILLATOR  (CRT-D)  10-31-2013   MDT Hillery Aldo CRTD implanted by Dr Lovena Le   BIOPSY  12/18/2018    Procedure: BIOPSY;  Surgeon: Daneil Dolin, MD;  [facility];  Service: Endoscopy;;  Gastric    BIV PACEMAKER INSERTION CRT-P N/A 12/17/2020   Procedure: DOWNGRAD TO BIV PACEMAKER INSERTION CRT-P;  Surgeon: Evans Lance, MD;  Location: [facility];  Service: Cardiovascular;  Laterality: N/A;   BREAST BIOPSY     bilaterally; benign   can not have MRI due to jaw surgery     CHOLECYSTECTOMY     COLONOSCOPY  2009   Dr. Sharlett Iles: Normal   ESOPHAGOGASTRODUODENOSCOPY  07/13/2011   small hh, patchy gastric erythema with scarring deformity of antrum, chronic gastritis, bx benign with no H.Pylori. Procedure: ESOPHAGOGASTRODUODENOSCOPY (EGD);  Surgeon: Daneil Dolin, MD;  [facility];  Service: Endoscopy;  Laterality: N/A;  10:45   ESOPHAGOGASTRODUODENOSCOPY N/A 12/18/2018   Dr. Gala Romney: normal esophagus s/p dilation, small hh, erythema in stomach with benign biopsy.    IMPLANTABLE CARDIOVERTER DEFIBRILLATOR IMPLANT N/A 10/31/2013   Procedure: IMPLANTABLE CARDIOVERTER DEFIBRILLATOR IMPLANT;  Surgeon: Evans Lance, MD;  Location: [facility] CATH LAB;  Service: Cardiovascular;  Laterality: N/A;   MALONEY DILATION N/A 12/18/2018   Procedure: Venia Minks DILATION;  Surgeon: Daneil Dolin, MD;  Location[facility];  Service: Endoscopy;  Laterality: N/A;   MANDIBLE SURGERY     secondary to malocclusion   THYROIDECTOMY     neoplasm   Ureteral Surgery  1970     Family History  Problem Relation Age of Onset   Brain cancer Mother    Heart disease Mother    Hypertension Mother    Stroke Mother    Atrial fibrillation Other    Stroke Brother    Colon cancer Neg Hx      Social History   Socioeconomic History   Marital status: Widowed    Spouse name: Not on file   Number of children: 2   Years of education: Not on file   Highest education level: Not on file  Occupational History   Occupation: retired  Tobacco Use   Smoking status: Never   Smokeless tobacco: Never   Vaping Use   Vaping Use: Never used  Substance and Sexual Activity   Alcohol use: No    Alcohol/week: 0.0 standard drinks   Drug use: No   Sexual activity: Not on file  Other Topics Concern   Not on file  Social History Narrative   Widowed         Social Determinants of Health   Financial Resource Strain: Not on file  Food Insecurity: Not on file  Transportation Needs: Not on file  Physical Activity: Not on file  Stress: Not on file  Social Connections: Not on  file  Intimate Partner Violence: Not on file     BP (!) 186/94   Pulse (!) 54   Ht '5\' 7"'$  (1.702 m)   Wt 240 lb 12.8 oz (109.2 kg)   SpO2 95%   BMI 37.71 kg/m   Physical Exam:  Well appearing NAD HEENT: Unremarkable Neck:  No JVD, no thyromegally Lymphatics:  No adenopathy Back:  No CVA tenderness Lungs:  Clear with no wheezes HEART:  Regular rate rhythm, no murmurs, no rubs, no clicks Abd:  soft, positive bowel sounds, no organomegally, no rebound, no guarding Ext:  2 plus pulses, no edema, no cyanosis, no clubbing Skin:  No rashes no nodules Neuro:  CN II through XII intact, motor grossly intact  DEVICE  Normal device function.  See PaceArt for details.   Assess/Plan:  Fatigue - her rate response is flat and we have reprogrammed her PPM with rate response turned on. Chronic systolic heart failure - her symptoms remain class 2. We will follow.  Obesity - she is encouraged to maintain a low fat diet. HTN - her bp is elevated and I asked her to increase her coreg to 1.5 tabs twice daily.  Carleene Overlie Jousha Schwandt,MD

## 2021-03-23 NOTE — Patient Instructions (Signed)
Medication Instructions:   Increase Coreg to 37.5 mg ( 1 1/2 Tablet) Two Times Daily   *If you need a refill on your cardiac medications before your next appointment, please call your pharmacy*   Lab Work: NONE  If you have labs (blood work) drawn today and your tests are completely normal, you will receive your results only by: [place] (if you have MyChart) OR A paper copy in the mail If you have any lab test that is abnormal or we need to change your treatment, we will call you to review the results.   Testing/Procedures: NONE    Follow-Up: At [facility], you and your health needs are our priority.  As part of our continuing mission to provide you with exceptional heart care, we have created designated Provider Care Teams.  These Care Teams include your primary Cardiologist (physician) and Advanced Practice Providers (APPs -  Physician Assistants and Nurse Practitioners) who all work together to provide you with the care you need, when you need it.  We recommend signing up for the patient portal called "MyChart".  Sign up information is provided on this After Visit Summary.  MyChart is used to connect with patients for Virtual Visits (Telemedicine).  Patients are able to view lab/test results, encounter notes, upcoming appointments, etc.  Non-urgent messages can be sent to your provider as well.   To learn more about what you can do with MyChart, go to [email or web address].    Your next appointment:   1 year(s)  The format for your next appointment:   In Person  Provider:   Cristopher Peru, MD   Other Instructions Thank you for choosing [facility]!

## 2021-03-24 ENCOUNTER — Encounter (HOSPITAL_COMMUNITY)
Admission: RE | Admit: 2021-03-24 | Discharge: 2021-03-24 | Disposition: A | Discharge status: Home / Self Care | Payer: Medicare HMO | Source: Ambulatory Visit | Attending: Ophthalmology | Admitting: Ophthalmology

## 2021-03-24 ENCOUNTER — Encounter (HOSPITAL_COMMUNITY): Payer: Self-pay

## 2021-03-24 DIAGNOSIS — H25812 Combined forms of age-related cataract, left eye: Secondary | ICD-10-CM | POA: Diagnosis not present

## 2021-03-24 HISTORY — DX: Presence of cardiac pacemaker: Z95.0

## 2021-03-28 ENCOUNTER — Ambulatory Visit (HOSPITAL_COMMUNITY)
Admission: RE | Admit: 2021-03-28 | Discharge: 2021-03-28 | Disposition: A | Discharge status: Home / Self Care | Payer: Medicare HMO | Attending: Ophthalmology | Admitting: Ophthalmology

## 2021-03-28 ENCOUNTER — Encounter (HOSPITAL_COMMUNITY): Payer: Self-pay | Admitting: Ophthalmology

## 2021-03-28 ENCOUNTER — Encounter (HOSPITAL_COMMUNITY)
Admission: RE | Disposition: A | Discharge status: Home / Self Care | Payer: Self-pay | Source: Home / Self Care | Attending: Ophthalmology

## 2021-03-28 ENCOUNTER — Ambulatory Visit (HOSPITAL_COMMUNITY): Payer: Medicare HMO | Admitting: Certified Registered"

## 2021-03-28 ENCOUNTER — Other Ambulatory Visit: Payer: Self-pay

## 2021-03-28 DIAGNOSIS — Z881 Allergy status to other antibiotic agents status: Secondary | ICD-10-CM | POA: Diagnosis not present

## 2021-03-28 DIAGNOSIS — H02831 Dermatochalasis of right upper eyelid: Secondary | ICD-10-CM | POA: Insufficient documentation

## 2021-03-28 DIAGNOSIS — H02834 Dermatochalasis of left upper eyelid: Secondary | ICD-10-CM | POA: Diagnosis not present

## 2021-03-28 DIAGNOSIS — Z888 Allergy status to other drugs, medicaments and biological substances status: Secondary | ICD-10-CM | POA: Insufficient documentation

## 2021-03-28 DIAGNOSIS — H25812 Combined forms of age-related cataract, left eye: Secondary | ICD-10-CM | POA: Diagnosis not present

## 2021-03-28 DIAGNOSIS — H0100B Unspecified blepharitis left eye, upper and lower eyelids: Secondary | ICD-10-CM | POA: Diagnosis not present

## 2021-03-28 DIAGNOSIS — I5022 Chronic systolic (congestive) heart failure: Secondary | ICD-10-CM | POA: Diagnosis not present

## 2021-03-28 DIAGNOSIS — Z9049 Acquired absence of other specified parts of digestive tract: Secondary | ICD-10-CM | POA: Diagnosis not present

## 2021-03-28 DIAGNOSIS — H0100A Unspecified blepharitis right eye, upper and lower eyelids: Secondary | ICD-10-CM | POA: Diagnosis not present

## 2021-03-28 DIAGNOSIS — H35373 Puckering of macula, bilateral: Secondary | ICD-10-CM | POA: Diagnosis not present

## 2021-03-28 DIAGNOSIS — Z90710 Acquired absence of both cervix and uterus: Secondary | ICD-10-CM | POA: Diagnosis not present

## 2021-03-28 DIAGNOSIS — Z8673 Personal history of transient ischemic attack (TIA), and cerebral infarction without residual deficits: Secondary | ICD-10-CM | POA: Insufficient documentation

## 2021-03-28 HISTORY — PX: CATARACT EXTRACTION W/PHACO: SHX586

## 2021-03-28 SURGERY — PHACOEMULSIFICATION, CATARACT, WITH IOL INSERTION
Anesthesia: Monitor Anesthesia Care | Site: Eye | Laterality: Left

## 2021-03-28 MED ORDER — BSS IO SOLN
INTRAOCULAR | Status: DC | PRN
  Administered 2021-03-28: 15 mL via INTRAOCULAR

## 2021-03-28 MED ORDER — EPINEPHRINE PF 1 MG/ML IJ SOLN
INTRAOCULAR | Status: DC | PRN
  Administered 2021-03-28: 500 mL

## 2021-03-28 MED ORDER — SODIUM HYALURONATE 10 MG/ML IO SOLUTION
PREFILLED_SYRINGE | INTRAOCULAR | Status: DC | PRN
  Administered 2021-03-28: 0.85 mL via INTRAOCULAR

## 2021-03-28 MED ORDER — LIDOCAINE HCL (PF) 1 % IJ SOLN
INTRAOCULAR | Status: DC | PRN
  Administered 2021-03-28: 1 mL via OPHTHALMIC

## 2021-03-28 MED ORDER — PHENYLEPHRINE HCL 2.5 % OP SOLN
1.0000 [drp] | OPHTHALMIC | Status: AC | PRN
  Administered 2021-03-28 (×3): 1 [drp] via OPHTHALMIC

## 2021-03-28 MED ORDER — SODIUM HYALURONATE 23MG/ML IO SOSY
PREFILLED_SYRINGE | INTRAOCULAR | Status: DC | PRN
  Administered 2021-03-28: 0.6 mL via INTRAOCULAR

## 2021-03-28 MED ORDER — TROPICAMIDE 1 % OP SOLN
1.0000 [drp] | OPHTHALMIC | Status: AC
  Administered 2021-03-28 (×3): 1 [drp] via OPHTHALMIC

## 2021-03-28 MED ORDER — POVIDONE-IODINE 5 % OP SOLN
OPHTHALMIC | Status: DC | PRN
  Administered 2021-03-28: 1 via OPHTHALMIC

## 2021-03-28 MED ORDER — STERILE WATER FOR IRRIGATION IR SOLN
Status: DC | PRN
  Administered 2021-03-28: 1000 mL

## 2021-03-28 MED ORDER — LIDOCAINE HCL 3.5 % OP GEL
1.0000 "application " | Freq: Once | OPHTHALMIC | Status: AC
  Administered 2021-03-28: 1 via OPHTHALMIC

## 2021-03-28 MED ORDER — NEOMYCIN-POLYMYXIN-DEXAMETH 3.5-10000-0.1 OP SUSP
OPHTHALMIC | Status: DC | PRN
  Administered 2021-03-28: 1 [drp] via OPHTHALMIC

## 2021-03-28 MED ORDER — TETRACAINE HCL 0.5 % OP SOLN
1.0000 [drp] | OPHTHALMIC | Status: AC | PRN
  Administered 2021-03-28 (×3): 1 [drp] via OPHTHALMIC

## 2021-03-28 SURGICAL SUPPLY — 12 items
CLOTH BEACON ORANGE TIMEOUT ST (SAFETY) ×1 IMPLANT
EYE SHIELD UNIVERSAL CLEAR (GAUZE/BANDAGES/DRESSINGS) ×1 IMPLANT
GLOVE SURG UNDER POLY LF SZ6.5 (GLOVE) ×1 IMPLANT
GLOVE SURG UNDER POLY LF SZ7 (GLOVE) ×1 IMPLANT
NDL HYPO 18GX1.5 BLUNT FILL (NEEDLE) IMPLANT
NEEDLE HYPO 18GX1.5 BLUNT FILL (NEEDLE) ×2 IMPLANT
PAD ARMBOARD 7.5X6 YLW CONV (MISCELLANEOUS) ×1 IMPLANT
RayOne EMV US (Intraocular Lens) ×1 IMPLANT
SYR TB 1ML LL NO SAFETY (SYRINGE) ×1 IMPLANT
TAPE SURG TRANSPORE 1 IN (GAUZE/BANDAGES/DRESSINGS) IMPLANT
TAPE SURGICAL TRANSPORE 1 IN (GAUZE/BANDAGES/DRESSINGS) ×2
WATER STERILE IRR 250ML POUR (IV SOLUTION) ×1 IMPLANT

## 2021-03-28 NOTE — Interval H&P Note (Signed)
History and Physical Interval Note:  03/28/2021 11:59 AM  [name]  has presented today for surgery, with the diagnosis of Nuclear sclerotic cataract - Left eye.  The various methods of treatment have been discussed with the patient and family. After consideration of risks, benefits and other options for treatment, the patient has consented to  Procedure(s) with comments: CATARACT EXTRACTION PHACO AND INTRAOCULAR LENS PLACEMENT ([place]) (Left) - left as a surgical intervention.  The patient's history has been reviewed, patient examined, no change in status, stable for surgery.  I have reviewed the patient's chart and labs.  Questions were answered to the patient's satisfaction.     Baruch Goldmann

## 2021-03-28 NOTE — Progress Notes (Signed)
Patient arrived and states that Dr. Marisa Hua is aware that she has redness noted in her left eye.

## 2021-03-28 NOTE — Transfer of Care (Signed)
Immediate Anesthesia Transfer of Care Note  Patient: [name]  Procedure(s) Performed: CATARACT EXTRACTION PHACO AND INTRAOCULAR LENS PLACEMENT (I[facility]) (Left: Eye)  Patient Location: Short Stay  Anesthesia Type:MAC  Level of Consciousness: awake, alert  and oriented  Airway & Oxygen Therapy: Patient Spontanous Breathing  Post-op Assessment: Report given to RN and Post -op Vital signs reviewed and stable  Post vital signs: Reviewed and stable  Last Vitals:  Vitals Value Taken Time  BP    Temp    Pulse    Resp    SpO2      Last Pain:  Vitals:   03/28/21 1143  TempSrc: Oral  PainSc: 0-No pain      Patients Stated Pain Goal: 5 (54/86/28 2417)  Complications: No notable events documented.

## 2021-03-28 NOTE — Anesthesia Procedure Notes (Signed)
Procedure Name: MAC Date/Time: 03/28/2021 12:25 PM Performed by: Orlie Dakin, CRNA Pre-anesthesia Checklist: Patient identified, Emergency Drugs available, Suction available and Patient being monitored Patient Re-evaluated:Patient Re-evaluated prior to induction Oxygen Delivery Method: Nasal cannula Placement Confirmation: positive ETCO2

## 2021-03-28 NOTE — Op Note (Signed)
Date of procedure: 03/28/21  Pre-operative diagnosis: Visually significant age-related combined cataract, Left Eye (H25.812)  Post-operative diagnosis: Visually significant age-related combined cataract, Left Eye (H25.812)  Procedure: Removal of cataract via phacoemulsification and insertion of intra-ocular lens Rayner [id number] +21.5D into the capsular bag of the Left Eye  Attending surgeon: Gerda Diss. Grenda Lora, MD, MA  Anesthesia: MAC, Topical [place]  Complications: None  Estimated Blood Loss: <59m (minimal)  Specimens: None  Implants: As above  Indications:  Visually significant age-related cataract, Left Eye  Procedure:  The patient was seen and identified in the pre-operative area. The operative eye was identified and dilated.  The operative eye was marked.  Topical anesthesia was administered to the operative eye.     The patient was then to the operative suite and placed in the supine position.  A timeout was performed confirming the patient, procedure to be performed, and all other relevant information.   The patient's face was prepped and draped in the usual fashion for intra-ocular surgery.  A lid speculum was placed into the operative eye and the surgical microscope moved into place and focused.  An inferotemporal paracentesis was created using a 20 gauge paracentesis blade.  Shugarcaine was injected into the anterior chamber.  Viscoelastic was injected into the anterior chamber.  A temporal clear-corneal main wound incision was created using a 2.433mmicrokeratome.  A continuous curvilinear capsulorrhexis was initiated using an irrigating cystitome and completed using capsulorrhexis forceps.  Hydrodissection and hydrodeliniation were performed.  Viscoelastic was injected into the anterior chamber.  A phacoemulsification handpiece and a chopper as a second instrument were used to remove the nucleus and epinucleus. The irrigation/aspiration handpiece was used to remove any remaining  cortical material.   The capsular bag was reinflated with viscoelastic, checked, and found to be intact.  The intraocular lens was inserted into the capsular bag.  The irrigation/aspiration handpiece was used to remove any remaining viscoelastic.  The clear corneal wound and paracentesis wounds were then hydrated and checked with Weck-Cels to be watertight.  The lid-speculum was removed.  The drape was removed.  The patient's face was cleaned with a wet and dry 4x4.   Maxitrol was instilled in the eye. A clear shield was taped over the eye. The patient was taken to the post-operative care unit in good condition, having tolerated the procedure well.  Post-Op Instructions: [facility] and will receive all other orders and instructions.

## 2021-03-28 NOTE — Anesthesia Postprocedure Evaluation (Signed)
Anesthesia Post Note  Patient: [name]  Procedure(s) Performed: CATARACT EXTRACTION PHACO AND INTRAOCULAR LENS PLACEMENT ([facility]) (Left: Eye)  Patient location during evaluation: Phase II Anesthesia Type: MAC Level of consciousness: awake Pain management: pain level controlled Vital Signs Assessment: post-procedure vital signs reviewed and stable Respiratory status: spontaneous breathing and respiratory function stable Cardiovascular status: blood pressure returned to baseline and stable Postop Assessment: no headache and no apparent nausea or vomiting Anesthetic complications: no Comments: Late entry   No notable events documented.   Last Vitals:  Vitals:   03/28/21 1143 03/28/21 1243  BP: (!) 164/71 (!) 171/82  Pulse: 70 61  Resp: 19 18  Temp: 36.7 C 36.6 C  SpO2: 97% 98%    Last Pain:  Vitals:   03/28/21 1243  TempSrc: Oral  PainSc: 0-No pain                 Louann Sjogren

## 2021-03-28 NOTE — Discharge Instructions (Addendum)
Please discharge patient when stable, will follow up today with Dr. Wrzosek at the [facility] [place] office immediately following discharge.  Leave shield in place until visit.  All paperwork with discharge instructions will be given at the office.  [facility] Address:  [address]  [place], [place] [address]  

## 2021-03-28 NOTE — Anesthesia Preprocedure Evaluation (Signed)
Anesthesia Evaluation  Patient identified by MRN, date of birth, ID band Patient awake    Reviewed: Allergy & Precautions, H&P , NPO status , Patient's Chart, lab work & pertinent test results, reviewed documented beta blocker date and time   Airway Mallampati: II  TM Distance: >3 FB Neck ROM: full    Dental no notable dental hx.    Pulmonary COPD,    Pulmonary exam normal breath sounds clear to auscultation       Cardiovascular Exercise Tolerance: Good hypertension, +CHF  + pacemaker  Rhythm:regular Rate:Normal     Neuro/Psych CVA, No Residual Symptoms negative psych ROS   GI/Hepatic Neg liver ROS, GERD  Medicated,  Endo/Other  Hypothyroidism   Renal/GU CRFRenal disease  negative genitourinary   Musculoskeletal   Abdominal   Peds  Hematology  (+) Blood dyscrasia, anemia ,   Anesthesia Other Findings   Reproductive/Obstetrics negative OB ROS                             Anesthesia Physical Anesthesia Plan  ASA: 3  Anesthesia Plan: MAC   Post-op Pain Management:    Induction:   PONV Risk Score and Plan:   Airway Management Planned:   Additional Equipment:   Intra-op Plan:   Post-operative Plan:   Informed Consent: I have reviewed the patients History and Physical, chart, labs and discussed the procedure including the risks, benefits and alternatives for the proposed anesthesia with the patient or authorized representative who has indicated his/her understanding and acceptance.     Dental Advisory Given  Plan Discussed with: CRNA  Anesthesia Plan Comments:         Anesthesia Quick Evaluation

## 2021-03-29 ENCOUNTER — Encounter (HOSPITAL_COMMUNITY): Payer: Self-pay | Admitting: Ophthalmology

## 2021-03-31 ENCOUNTER — Ambulatory Visit: Payer: Medicare HMO | Admitting: Cardiology

## 2021-04-04 DIAGNOSIS — H25811 Combined forms of age-related cataract, right eye: Secondary | ICD-10-CM | POA: Diagnosis not present

## 2021-04-06 ENCOUNTER — Other Ambulatory Visit: Payer: Self-pay

## 2021-04-06 ENCOUNTER — Encounter (HOSPITAL_COMMUNITY): Payer: Self-pay

## 2021-04-06 ENCOUNTER — Encounter (HOSPITAL_COMMUNITY)
Admission: RE | Admit: 2021-04-06 | Discharge: 2021-04-06 | Disposition: A | Discharge status: Home / Self Care | Payer: Medicare HMO | Source: Ambulatory Visit | Attending: Ophthalmology | Admitting: Ophthalmology

## 2021-04-07 NOTE — H&P (Signed)
Surgical History & Physical  Patient Name: [name] DOB: 09-Jul-1941  Surgery: Cataract extraction with intraocular lens implant phacoemulsification; Right Eye  Surgeon: Baruch Goldmann MD Surgery Date: 04-11-21 Pre-Op Date: 04-04-21  HPI: A 49 Yr. old female patient 1. The patient complains of difficulty when driving due to glare from headlights or sun, which began 2 years ago. The right eye is affected. The episode is gradual. The condition's severity increased since last visit. Symptoms occur when the patient is driving and outside. This is negatively affecting her quality of life. 2. The patient is returning after cataract post-op. The left eye is affected. Status post cataract post-op, which began 1 week ago: Since the last visit, the affected area is doing well. The patient's vision is improved. Patient is following medication instructions. HPI Completed by Dr. Baruch Goldmann  Medical History: Cataracts Heart Problem High Blood Pressure Mini stroke (2015) Stroke Thyroid Problems  Review of Systems Negative Allergic/Immunologic Negative Cardiovascular Negative Constitutional Negative Ear, Nose, Mouth & Throat Negative Endocrine Negative Eyes Negative Gastrointestinal Negative Genitourinary Negative Hemotologic/Lymphatic Negative Integumentary Negative Musculoskeletal Negative Neurological Negative Psychiatry Negative Respiratory  Social   Never smoked   Medication TheraTears, Prednisolone-Moxifloxacin-Bromfenac, Allegra, Benazepril, Carvedilol, Centrum Silver, Clonidine, Flonase, Furosemide, Prevacid, Spironolactone, Synthroid, Tramadol, Vitamin D, Xarelto, Hydralazine, Ondansetron, Lansoprazole,   Sx/Procedures Phaco c IOL OS, Hystectomy, Thyroid Removal, Gallbladder Removal, Appendectomy, Right breast biopsy, Jaw Sx,   Drug Allergies  Cipro, Delansoprozale, Statins,   History & Physical: Heent: Cataract, Right Eye NECK: supple without bruits LUNGS: lungs  clear to auscultation CV: regular rate and rhythm Abdomen: soft and non-tender  Impression & Plan: Assessment: 1.  NUCLEAR SCLEROSIS AGE RELATED; Right Eye (H25.11) 2.  CATARACT EXTRACTION STATUS; Left Eye (Z98.42) 3.  INTRAOCULAR LENS IOL (Z96.1)  Plan: 1.  Cataract accounts for the patient's decreased vision. This visual impairment is not correctable with a tolerable change in glasses or contact lenses. Cataract surgery with an implantation of a new lens should significantly improve the visual and functional status of the patient. Discussed all risks, benefits, alternatives, and potential complications. Discussed the procedures and recovery. Patient desires to have surgery. A-scan ordered and performed today for intra-ocular lens calculations. The surgery will be performed in order to improve vision for driving, reading, and for eye examinations. Recommend phacoemulsification with intra-ocular lens. Recommend Dextenza for post-operative pain and inflammation. Right Eye. Surgery required to correct imbalance of vision. Dilates well - shugarcaine by protocol.  2.  1 week after cataract surgery. Doing well with improved vision and normal eye pressure. Call with any problems or concerns. Continue Pred-Moxi-Brom 2x/day for 3 more weeks.  3.  Doing well since surgery Continue Post-op medications

## 2021-04-11 ENCOUNTER — Ambulatory Visit (HOSPITAL_COMMUNITY): Payer: Medicare HMO | Admitting: Anesthesiology

## 2021-04-11 ENCOUNTER — Encounter (HOSPITAL_COMMUNITY)
Admission: RE | Disposition: A | Discharge status: Home / Self Care | Payer: Self-pay | Source: Home / Self Care | Attending: Ophthalmology

## 2021-04-11 ENCOUNTER — Ambulatory Visit (HOSPITAL_COMMUNITY)
Admission: RE | Admit: 2021-04-11 | Discharge: 2021-04-11 | Disposition: A | Discharge status: Home / Self Care | Payer: Medicare HMO | Attending: Ophthalmology | Admitting: Ophthalmology

## 2021-04-11 ENCOUNTER — Encounter (HOSPITAL_COMMUNITY): Payer: Self-pay | Admitting: Ophthalmology

## 2021-04-11 DIAGNOSIS — Z888 Allergy status to other drugs, medicaments and biological substances status: Secondary | ICD-10-CM | POA: Diagnosis not present

## 2021-04-11 DIAGNOSIS — Z950 Presence of cardiac pacemaker: Secondary | ICD-10-CM | POA: Diagnosis not present

## 2021-04-11 DIAGNOSIS — Z881 Allergy status to other antibiotic agents status: Secondary | ICD-10-CM | POA: Insufficient documentation

## 2021-04-11 DIAGNOSIS — H2511 Age-related nuclear cataract, right eye: Secondary | ICD-10-CM | POA: Insufficient documentation

## 2021-04-11 DIAGNOSIS — I10 Essential (primary) hypertension: Secondary | ICD-10-CM | POA: Diagnosis not present

## 2021-04-11 DIAGNOSIS — Z8673 Personal history of transient ischemic attack (TIA), and cerebral infarction without residual deficits: Secondary | ICD-10-CM | POA: Diagnosis not present

## 2021-04-11 DIAGNOSIS — Z9842 Cataract extraction status, left eye: Secondary | ICD-10-CM | POA: Diagnosis not present

## 2021-04-11 DIAGNOSIS — J449 Chronic obstructive pulmonary disease, unspecified: Secondary | ICD-10-CM | POA: Diagnosis not present

## 2021-04-11 HISTORY — PX: CATARACT EXTRACTION W/PHACO: SHX586

## 2021-04-11 SURGERY — PHACOEMULSIFICATION, CATARACT, WITH IOL INSERTION
Anesthesia: Monitor Anesthesia Care | Laterality: Right

## 2021-04-11 MED ORDER — BSS IO SOLN
INTRAOCULAR | Status: DC | PRN
  Administered 2021-04-11: 15 mL via INTRAOCULAR

## 2021-04-11 MED ORDER — CYCLOPENTOLATE-PHENYLEPHRINE 0.2-1 % OP SOLN
1.0000 [drp] | OPHTHALMIC | Status: DC | PRN
  Filled 2021-04-11: qty 2

## 2021-04-11 MED ORDER — STERILE WATER FOR IRRIGATION IR SOLN
Status: DC | PRN
  Administered 2021-04-11: 250 mL

## 2021-04-11 MED ORDER — SODIUM HYALURONATE 23MG/ML IO SOSY
PREFILLED_SYRINGE | INTRAOCULAR | Status: DC | PRN
  Administered 2021-04-11: 0.6 mL via INTRAOCULAR

## 2021-04-11 MED ORDER — PHENYLEPHRINE HCL 2.5 % OP SOLN
1.0000 [drp] | OPHTHALMIC | Status: AC | PRN
  Administered 2021-04-11 (×3): 1 [drp] via OPHTHALMIC

## 2021-04-11 MED ORDER — POVIDONE-IODINE 5 % OP SOLN
OPHTHALMIC | Status: DC | PRN
  Administered 2021-04-11: 1 via OPHTHALMIC

## 2021-04-11 MED ORDER — LIDOCAINE HCL 3.5 % OP GEL
1.0000 "application " | Freq: Once | OPHTHALMIC | Status: AC
  Administered 2021-04-11: 1 via OPHTHALMIC

## 2021-04-11 MED ORDER — EPINEPHRINE PF 1 MG/ML IJ SOLN
INTRAMUSCULAR | Status: AC
  Filled 2021-04-11: qty 2

## 2021-04-11 MED ORDER — TROPICAMIDE 1 % OP SOLN
1.0000 [drp] | OPHTHALMIC | Status: AC
  Administered 2021-04-11 (×3): 1 [drp] via OPHTHALMIC

## 2021-04-11 MED ORDER — LIDOCAINE HCL (PF) 1 % IJ SOLN
INTRAOCULAR | Status: DC | PRN
  Administered 2021-04-11: 1 mL via OPHTHALMIC

## 2021-04-11 MED ORDER — NEOMYCIN-POLYMYXIN-DEXAMETH 3.5-10000-0.1 OP SUSP
OPHTHALMIC | Status: DC | PRN
  Administered 2021-04-11: 1 [drp] via OPHTHALMIC

## 2021-04-11 MED ORDER — SODIUM HYALURONATE 10 MG/ML IO SOLUTION
PREFILLED_SYRINGE | INTRAOCULAR | Status: DC | PRN
  Administered 2021-04-11: 0.85 mL via INTRAOCULAR

## 2021-04-11 MED ORDER — EPINEPHRINE PF 1 MG/ML IJ SOLN
INTRAOCULAR | Status: DC | PRN
  Administered 2021-04-11: 500 mL

## 2021-04-11 MED ORDER — TETRACAINE HCL 0.5 % OP SOLN
1.0000 [drp] | OPHTHALMIC | Status: AC | PRN
  Administered 2021-04-11 (×3): 1 [drp] via OPHTHALMIC

## 2021-04-11 SURGICAL SUPPLY — 12 items
CLOTH BEACON ORANGE TIMEOUT ST (SAFETY) ×1 IMPLANT
EYE SHIELD UNIVERSAL CLEAR (GAUZE/BANDAGES/DRESSINGS) ×1 IMPLANT
GLOVE SURG UNDER POLY LF SZ6.5 (GLOVE) ×1 IMPLANT
GLOVE SURG UNDER POLY LF SZ7 (GLOVE) ×1 IMPLANT
NDL HYPO 18GX1.5 BLUNT FILL (NEEDLE) IMPLANT
NEEDLE HYPO 18GX1.5 BLUNT FILL (NEEDLE) ×2 IMPLANT
PAD ARMBOARD 7.5X6 YLW CONV (MISCELLANEOUS) ×1 IMPLANT
RayOne EMV US Intraocular Lens (Intraocular Lens) ×1 IMPLANT
SYR TB 1ML LL NO SAFETY (SYRINGE) ×1 IMPLANT
TAPE SURG TRANSPORE 1 IN (GAUZE/BANDAGES/DRESSINGS) IMPLANT
TAPE SURGICAL TRANSPORE 1 IN (GAUZE/BANDAGES/DRESSINGS) ×2
WATER STERILE IRR 250ML POUR (IV SOLUTION) ×1 IMPLANT

## 2021-04-11 NOTE — Discharge Instructions (Signed)
Please discharge patient when stable, will follow up today with Dr. Vanden Fawaz at the [facility] [place] office immediately following discharge.  Leave shield in place until visit.  All paperwork with discharge instructions will be given at the office.  [facility] Address:  [address]  [place], [place] [address]  

## 2021-04-11 NOTE — Interval H&P Note (Signed)
History and Physical Interval Note:  04/11/2021 8:21 AM  [name]  has presented today for surgery, with the diagnosis of nuclear cataract right eye.  The various methods of treatment have been discussed with the patient and family. After consideration of risks, benefits and other options for treatment, the patient has consented to  Procedure(s) with comments: CATARACT EXTRACTION PHACO AND INTRAOCULAR LENS PLACEMENT RIGHT EYE (Right) - right as a surgical intervention.  The patient's history has been reviewed, patient examined, no change in status, stable for surgery.  I have reviewed the patient's chart and labs.  Questions were answered to the patient's satisfaction.     Baruch Goldmann

## 2021-04-11 NOTE — Transfer of Care (Signed)
Immediate Anesthesia Transfer of Care Note  Patient: [name]  Procedure(s) Performed: CATARACT EXTRACTION PHACO AND INTRAOCULAR LENS PLACEMENT RIGHT EYE (Right)  Patient Location: Short Stay  Anesthesia Type:MAC  Level of Consciousness: awake, alert  and oriented  Airway & Oxygen Therapy: Patient Spontanous Breathing  Post-op Assessment: Report given to RN and Post -op Vital signs reviewed and stable  Post vital signs: Reviewed and stable  Last Vitals:  Vitals Value Taken Time  BP    Temp    Pulse    Resp    SpO2      Last Pain:  Vitals:   04/11/21 0720  PainSc: 0-No pain         Complications: No notable events documented.

## 2021-04-11 NOTE — Interval H&P Note (Signed)
History and Physical Interval Note:  04/11/2021 8:22 AM  [name]  has presented today for surgery, with the diagnosis of nuclear cataract right eye.  The various methods of treatment have been discussed with the patient and family. After consideration of risks, benefits and other options for treatment, the patient has consented to  Procedure(s) with comments: CATARACT EXTRACTION PHACO AND INTRAOCULAR LENS PLACEMENT RIGHT EYE (Right) - right as a surgical intervention.  The patient's history has been reviewed, patient examined, no change in status, stable for surgery.  I have reviewed the patient's chart and labs.  Questions were answered to the patient's satisfaction.     Baruch Goldmann

## 2021-04-11 NOTE — Anesthesia Preprocedure Evaluation (Signed)
Anesthesia Evaluation  Patient identified by MRN, date of birth, ID band Patient awake    Reviewed: Allergy & Precautions, NPO status , Patient's Chart, lab work & pertinent test results, reviewed documented beta blocker date and time   History of Anesthesia Complications Negative for: history of anesthetic complications  Airway Mallampati: II  TM Distance: >3 FB Neck ROM: Full    Dental  (+) Dental Advisory Given, Caps   Pulmonary shortness of breath and with exertion, COPD,  COPD inhaler,    Pulmonary exam normal breath sounds clear to auscultation       Cardiovascular Exercise Tolerance: Poor hypertension, Pt. on medications and Pt. on home beta blockers +CHF and + DOE  Normal cardiovascular exam+ dysrhythmias Atrial Fibrillation + pacemaker  Rhythm:Regular Rate:Normal  Echo 03/09/20 LVEF normal range at this point on medical therapy, 55 to 60% (previously 40%).  I met her recently for a routine visit and she was clinically stable.  Keep regular follow-up plan, no change in cardiac medications for now.   Neuro/Psych CVA, No Residual Symptoms negative psych ROS   GI/Hepatic GERD  Medicated,  Endo/Other  Hypothyroidism   Renal/GU Renal InsufficiencyRenal disease     Musculoskeletal  (+) Arthritis , Osteoarthritis,    Abdominal   Peds  Hematology  (+) anemia ,   Anesthesia Other Findings Chronic back pain  Reproductive/Obstetrics                           Anesthesia Physical Anesthesia Plan  ASA: 3  Anesthesia Plan: MAC   Post-op Pain Management:    Induction:   PONV Risk Score and Plan:   Airway Management Planned: Nasal Cannula and Natural Airway  Additional Equipment:   Intra-op Plan:   Post-operative Plan:   Informed Consent: I have reviewed the patients History and Physical, chart, labs and discussed the procedure including the risks, benefits and alternatives for the  proposed anesthesia with the patient or authorized representative who has indicated his/her understanding and acceptance.     Dental advisory given  Plan Discussed with: CRNA and Surgeon  Anesthesia Plan Comments:         Anesthesia Quick Evaluation

## 2021-04-11 NOTE — Anesthesia Postprocedure Evaluation (Signed)
Anesthesia Post Note  Patient: [name]  Procedure(s) Performed: CATARACT EXTRACTION PHACO AND INTRAOCULAR LENS PLACEMENT RIGHT EYE (Right)  Patient location during evaluation: Phase II Anesthesia Type: MAC Level of consciousness: awake and alert and oriented Pain management: pain level controlled Vital Signs Assessment: post-procedure vital signs reviewed and stable Respiratory status: spontaneous breathing and respiratory function stable Cardiovascular status: blood pressure returned to baseline and stable Postop Assessment: no apparent nausea or vomiting Anesthetic complications: no   No notable events documented.   Last Vitals:  Vitals:   04/11/21 0745 04/11/21 0846  BP:  (!) 161/69  Pulse: 61 60  Resp: 17 18  Temp:  37 C  SpO2: 95%     Last Pain:  Vitals:   04/11/21 0846  TempSrc: Axillary  PainSc: 5                  Pride Gonzales C Naketa Daddario

## 2021-04-11 NOTE — Op Note (Signed)
Date of procedure: 04/11/21  Pre-operative diagnosis: Visually significant age-related nuclear cataract, Right Eye (H25.11)  Post-operative diagnosis: Visually significant age-related nuclear cataract, Right Eye  Procedure: Removal of cataract via phacoemulsification and insertion of intra-ocular lens Rayner [id number] +21.5D into the capsular bag of the Right Eye  Attending surgeon: Gerda Diss. Durk Carmen, MD, MA  Anesthesia: MAC, Topical Akten  Complications: None  Estimated Blood Loss: <11m (minimal)  Specimens: None  Implants: As above  Indications:  Visually significant age-related cataract, Right Eye  Procedure:  The patient was seen and identified in the pre-operative area. The operative eye was identified and dilated.  The operative eye was marked.  Topical anesthesia was administered to the operative eye.     The patient was then to the operative suite and placed in the supine position.  A timeout was performed confirming the patient, procedure to be performed, and all other relevant information.   The patient's face was prepped and draped in the usual fashion for intra-ocular surgery.  A lid speculum was placed into the operative eye and the surgical microscope moved into place and focused.  A superotemporal paracentesis was created using a 20 gauge paracentesis blade.  Shugarcaine was injected into the anterior chamber.  Viscoelastic was injected into the anterior chamber.  A temporal clear-corneal main wound incision was created using a 2.483mmicrokeratome.  A continuous curvilinear capsulorrhexis was initiated using an irrigating cystitome and completed using capsulorrhexis forceps.  Hydrodissection and hydrodeliniation were performed.  Viscoelastic was injected into the anterior chamber.  A phacoemulsification handpiece and a chopper as a second instrument were used to remove the nucleus and epinucleus. The irrigation/aspiration handpiece was used to remove any remaining cortical  material.   The capsular bag was reinflated with viscoelastic, checked, and found to be intact.  The intraocular lens was inserted into the capsular bag.  The irrigation/aspiration handpiece was used to remove any remaining viscoelastic.  The clear corneal wound and paracentesis wounds were then hydrated and checked with Weck-Cels to be watertight.  The lid-speculum and drape was removed, and the patient's face was cleaned with a wet and dry 4x4.  Maxitrol was instilled in the eye before a clear shield was taped over the eye. The patient was taken to the post-operative care unit in good condition, having tolerated the procedure well.  Post-Op Instructions: [facility] and will receive all other orders and instructions.

## 2021-04-12 ENCOUNTER — Encounter (HOSPITAL_COMMUNITY): Payer: Self-pay | Admitting: Ophthalmology

## 2021-04-15 DIAGNOSIS — E1165 Type 2 diabetes mellitus with hyperglycemia: Secondary | ICD-10-CM | POA: Diagnosis not present

## 2021-04-15 DIAGNOSIS — I10 Essential (primary) hypertension: Secondary | ICD-10-CM | POA: Diagnosis not present

## 2021-05-11 ENCOUNTER — Other Ambulatory Visit: Payer: Self-pay | Admitting: Cardiology

## 2021-05-11 DIAGNOSIS — I5023 Acute on chronic systolic (congestive) heart failure: Secondary | ICD-10-CM

## 2021-05-20 ENCOUNTER — Other Ambulatory Visit: Payer: Self-pay | Admitting: Cardiology

## 2021-05-26 DIAGNOSIS — Z0100 Encounter for examination of eyes and vision without abnormal findings: Secondary | ICD-10-CM | POA: Diagnosis not present

## 2021-06-06 ENCOUNTER — Other Ambulatory Visit: Payer: Self-pay | Admitting: Cardiology

## 2021-06-06 DIAGNOSIS — I5023 Acute on chronic systolic (congestive) heart failure: Secondary | ICD-10-CM

## 2021-06-15 ENCOUNTER — Ambulatory Visit: Payer: Medicare HMO | Admitting: Cardiology

## 2021-06-17 DIAGNOSIS — E039 Hypothyroidism, unspecified: Secondary | ICD-10-CM | POA: Diagnosis not present

## 2021-06-17 DIAGNOSIS — E559 Vitamin D deficiency, unspecified: Secondary | ICD-10-CM | POA: Diagnosis not present

## 2021-06-17 DIAGNOSIS — R7303 Prediabetes: Secondary | ICD-10-CM | POA: Diagnosis not present

## 2021-06-17 DIAGNOSIS — I10 Essential (primary) hypertension: Secondary | ICD-10-CM | POA: Diagnosis not present

## 2021-06-20 ENCOUNTER — Other Ambulatory Visit: Payer: Self-pay | Admitting: Cardiology

## 2021-06-21 ENCOUNTER — Ambulatory Visit (INDEPENDENT_AMBULATORY_CARE_PROVIDER_SITE_OTHER): Payer: Medicare HMO

## 2021-06-21 DIAGNOSIS — I5022 Chronic systolic (congestive) heart failure: Secondary | ICD-10-CM | POA: Diagnosis not present

## 2021-06-21 DIAGNOSIS — I428 Other cardiomyopathies: Secondary | ICD-10-CM

## 2021-06-22 DIAGNOSIS — Z0001 Encounter for general adult medical examination with abnormal findings: Secondary | ICD-10-CM | POA: Diagnosis not present

## 2021-06-22 DIAGNOSIS — M791 Myalgia, unspecified site: Secondary | ICD-10-CM | POA: Diagnosis not present

## 2021-06-22 DIAGNOSIS — I482 Chronic atrial fibrillation, unspecified: Secondary | ICD-10-CM | POA: Diagnosis not present

## 2021-06-22 DIAGNOSIS — I429 Cardiomyopathy, unspecified: Secondary | ICD-10-CM | POA: Diagnosis not present

## 2021-06-22 DIAGNOSIS — I10 Essential (primary) hypertension: Secondary | ICD-10-CM | POA: Diagnosis not present

## 2021-06-22 DIAGNOSIS — I5022 Chronic systolic (congestive) heart failure: Secondary | ICD-10-CM | POA: Diagnosis not present

## 2021-06-22 DIAGNOSIS — R051 Acute cough: Secondary | ICD-10-CM | POA: Diagnosis not present

## 2021-06-22 DIAGNOSIS — N1831 Chronic kidney disease, stage 3a: Secondary | ICD-10-CM | POA: Diagnosis not present

## 2021-06-22 DIAGNOSIS — R519 Headache, unspecified: Secondary | ICD-10-CM | POA: Diagnosis not present

## 2021-06-23 ENCOUNTER — Telehealth: Payer: Self-pay | Admitting: Internal Medicine

## 2021-06-23 NOTE — Telephone Encounter (Signed)
I helped the patient and her son set up the phone monitor. The patient requested a bedside monitor. I told her the nurse can bring the monitor 07/21/2021. She would like the patient to come between 9-10 to pick up the monitor.

## 2021-06-23 NOTE — Telephone Encounter (Signed)
Pt came into the [place] office stating she's having a hard time getting her home remote check to go through.   Please give pt a call @ [phone]

## 2021-06-24 LAB — CUP PACEART REMOTE DEVICE CHECK
Battery Remaining Longevity: 108 mo
Battery Voltage: 3.08 V
Brady Statistic AP VP Percent: 74.33 %
Brady Statistic AP VS Percent: 0.3 %
Brady Statistic AS VP Percent: 14.31 %
Brady Statistic AS VS Percent: 11.06 %
Brady Statistic RA Percent Paced: 82.74 %
Brady Statistic RV Percent Paced: 87.13 %
Date Time Interrogation Session: 20221208130650
Implantable Lead Implant Date: 20150417
Implantable Lead Implant Date: 20150417
Implantable Lead Implant Date: 20150417
Implantable Lead Location: 753858
Implantable Lead Location: 753859
Implantable Lead Location: 753860
Implantable Lead Model: 4298
Implantable Lead Model: 5076
Implantable Lead Model: 6935
Implantable Pulse Generator Implant Date: 20220603
Lead Channel Impedance Value: 247 Ohm
Lead Channel Impedance Value: 266 Ohm
Lead Channel Impedance Value: 266 Ohm
Lead Channel Impedance Value: 266 Ohm
Lead Channel Impedance Value: 266 Ohm
Lead Channel Impedance Value: 304 Ohm
Lead Channel Impedance Value: 323 Ohm
Lead Channel Impedance Value: 323 Ohm
Lead Channel Impedance Value: 399 Ohm
Lead Channel Impedance Value: 418 Ohm
Lead Channel Impedance Value: 418 Ohm
Lead Channel Impedance Value: 437 Ohm
Lead Channel Impedance Value: 437 Ohm
Lead Channel Impedance Value: 437 Ohm
Lead Channel Pacing Threshold Amplitude: 0.625 V
Lead Channel Pacing Threshold Amplitude: 0.875 V
Lead Channel Pacing Threshold Amplitude: 1.75 V
Lead Channel Pacing Threshold Pulse Width: 0.4 ms
Lead Channel Pacing Threshold Pulse Width: 0.4 ms
Lead Channel Pacing Threshold Pulse Width: 0.6 ms
Lead Channel Sensing Intrinsic Amplitude: 1.25 mV
Lead Channel Sensing Intrinsic Amplitude: 1.25 mV
Lead Channel Sensing Intrinsic Amplitude: 8.75 mV
Lead Channel Sensing Intrinsic Amplitude: 8.75 mV
Lead Channel Setting Pacing Amplitude: 1.5 V
Lead Channel Setting Pacing Amplitude: 2 V
Lead Channel Setting Pacing Amplitude: 2.25 V
Lead Channel Setting Pacing Pulse Width: 0.4 ms
Lead Channel Setting Pacing Pulse Width: 0.6 ms
Lead Channel Setting Sensing Sensitivity: 0.9 mV

## 2021-06-29 NOTE — Progress Notes (Signed)
Remote pacemaker transmission.   

## 2021-07-06 ENCOUNTER — Ambulatory Visit: Payer: Medicare HMO | Admitting: Student

## 2021-08-08 ENCOUNTER — Other Ambulatory Visit: Payer: Self-pay | Admitting: Cardiology

## 2021-08-08 NOTE — Telephone Encounter (Signed)
Prescription refill request for Xarelto received.  Indication: Atrial fib Last office visit: 03/23/21  Beckie Salts MD Weight: 484.7UW Age: 81 Scr: 1.11 on 06/18/21 CrCl: 68.52  Based on above findings Xarelto 20mg  daily is the appropriate dose.  Refill approved.

## 2021-08-16 DIAGNOSIS — E782 Mixed hyperlipidemia: Secondary | ICD-10-CM | POA: Diagnosis not present

## 2021-08-16 DIAGNOSIS — I10 Essential (primary) hypertension: Secondary | ICD-10-CM | POA: Diagnosis not present

## 2021-08-25 ENCOUNTER — Ambulatory Visit: Payer: Medicare HMO | Admitting: Student

## 2021-08-25 ENCOUNTER — Other Ambulatory Visit: Payer: Self-pay

## 2021-08-25 ENCOUNTER — Encounter: Payer: Self-pay | Admitting: Student

## 2021-08-25 VITALS — BP 142/88 | HR 68 | Ht 67.0 in | Wt 253.0 lb

## 2021-08-25 DIAGNOSIS — I480 Paroxysmal atrial fibrillation: Secondary | ICD-10-CM | POA: Diagnosis not present

## 2021-08-25 DIAGNOSIS — Z95810 Presence of automatic (implantable) cardiac defibrillator: Secondary | ICD-10-CM

## 2021-08-25 DIAGNOSIS — I890 Lymphedema, not elsewhere classified: Secondary | ICD-10-CM

## 2021-08-25 DIAGNOSIS — I10 Essential (primary) hypertension: Secondary | ICD-10-CM | POA: Diagnosis not present

## 2021-08-25 DIAGNOSIS — I428 Other cardiomyopathies: Secondary | ICD-10-CM | POA: Diagnosis not present

## 2021-08-25 DIAGNOSIS — J449 Chronic obstructive pulmonary disease, unspecified: Secondary | ICD-10-CM | POA: Diagnosis not present

## 2021-08-25 NOTE — Progress Notes (Signed)
Cardiology Office Note    Date:  08/25/2021   ID:  Nada, Godley 07-12-1941, MRN [id number]  PCP:  Celene Squibb, MD  Cardiologist: Rozann Lesches, MD   EP: Dr. Lovena Le  Chief Complaint  Patient presents with   Follow-up    Annual Visit    History of Present Illness:    [name] is a 81 y.o. 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  She was last examined Dr. Domenic Polite in 02/2020 and reported overall doing well and denied any recent anginal symptoms. A follow-up echocardiogram was obtained which showed her EF had normalized to 55 to 60% with mild LVH and grade 1 diastolic dysfunction. She did not have any significant valve abnormalities. She did follow-up with Dr. Lovena Le in the interim and due to her device approaching ERI, she underwent implantation of a new biventricular pacemaker on 12/17/2020. Most recent device interrogation in 06/2021 showed her device was functioning normally and she was BiV paced 88.6% of the time. She did appear to have brief bursts of AT.  In talking with the patient today, she reports multiple medical issues over the past several years. Reports her activity is overall limited secondary to pain along her hip and knee. She also has significant lymphedema and questions if she would be a candidate for compression wraps as she reports her sister has gone through treatment for lymphedema and had improvement with this. She has baseline orthopnea and typically sleeps in her recliner.  No reported exertional chest pain or palpitations.  Past Medical History:  Diagnosis Date   Allergy    Anemia    Atrial fibrillation ([place])    Cardiomyopathy, nonischemic ([place])    a. EF previously 35% in 2008 with cath showing no  significant CAD b. EF improved to 55-60% in 02/2020   Cataract    Cerebrovascular accident [facility])    No clinical event; asymptomatic CT finding   CHF (congestive heart failure) ([place])    Chronic kidney disease    COPD (chronic obstructive pulmonary disease) (HCC)    DDD (degenerative disc disease), cervical    Cervical   Degenerative joint disease    Right hip and knee   Essential hypertension    GERD (gastroesophageal reflux disease)    Hip dislocation, right (HCC)    Hyperlipemia    Hypothyroid    LBBB (left bundle branch block)    Pacemaker    S/P colonoscopy 2009   Dr. Sharlett Iles: reportedly normal per pt   S/P endoscopy 1980s    Past Surgical History:  Procedure Laterality Date   ABDOMINAL HYSTERECTOMY     APPENDECTOMY     BI-VENTRICULAR IMPLANTABLE CARDIOVERTER DEFIBRILLATOR  (CRT-D)  10-31-2013   MDT Hillery Aldo CRTD implanted by Dr Lovena Le   BIOPSY  12/18/2018   Procedure: BIOPSY;  Surgeon: Daneil Dolin, MD;  [facility];  Service: Endoscopy;;  Gastric    BIV PACEMAKER INSERTION CRT-P N/A 12/17/2020   Procedure: DOWNGRAD TO BIV PACEMAKER INSERTION CRT-P;  Surgeon: Evans Lance, MD;  Location: [place] CV LAB;  Service: Cardiovascular;  Laterality: N/A;   BREAST BIOPSY     bilaterally; benign   can not have MRI due to jaw surgery     CATARACT EXTRACTION  W/PHACO Left 03/28/2021   Procedure: CATARACT EXTRACTION PHACO AND INTRAOCULAR LENS PLACEMENT (IOC);  Surgeon: Baruch Goldmann, MD;  Location: [facility] ORS;  Service: Ophthalmology;  Laterality: Left;  CDE  17.17   CATARACT EXTRACTION W/PHACO Right 04/11/2021   Procedure: CATARACT EXTRACTION PHACO AND INTRAOCULAR LENS PLACEMENT RIGHT EYE;  Surgeon: Baruch Goldmann, MD;  Location: [facility] ORS;  Service: Ophthalmology;  Laterality: Right;  right CDE=17.03   CHOLECYSTECTOMY     COLONOSCOPY  2009   Dr. Sharlett Iles: Normal   ESOPHAGOGASTRODUODENOSCOPY  07/13/2011   small hh, patchy gastric erythema with scarring deformity of  antrum, chronic gastritis, bx benign with no H.Pylori. Procedure: ESOPHAGOGASTRODUODENOSCOPY (EGD);  Surgeon: Daneil Dolin, MD;  [facility];  Service: Endoscopy;  Laterality: N/A;  10:45   ESOPHAGOGASTRODUODENOSCOPY N/A 12/18/2018   Dr. Gala Romney: normal esophagus s/p dilation, small hh, erythema in stomach with benign biopsy.    IMPLANTABLE CARDIOVERTER DEFIBRILLATOR IMPLANT N/A 10/31/2013   Procedure: IMPLANTABLE CARDIOVERTER DEFIBRILLATOR IMPLANT;  Surgeon: Evans Lance, MD;  Location: [facility] CATH LAB;  Service: Cardiovascular;  Laterality: N/A;   MALONEY DILATION N/A 12/18/2018   Procedure: Venia Minks DILATION;  Surgeon: Daneil Dolin, MD;  Location[facility];  Service: Endoscopy;  Laterality: N/A;   MANDIBLE SURGERY     secondary to malocclusion   THYROIDECTOMY     neoplasm   Ureteral Surgery  1970    Current Medications: Outpatient Medications Prior to Visit  Medication Sig Dispense Refill   benazepril (LOTENSIN) 40 MG tablet Take 1 tablet (40 mg total) by mouth daily. 90 tablet 1   carvedilol (COREG) 25 MG tablet Take 1.5 tablets (37.5 mg total) by mouth 2 (two) times daily with a meal. 270 tablet 3   cholecalciferol (VITAMIN D) 1000 UNITS tablet Take 1,000 Units by mouth daily.     fexofenadine (ALLEGRA) 180 MG tablet Take 180 mg by mouth every morning.     fluticasone (FLONASE) 50 MCG/ACT nasal spray Place 2 sprays into both nostrils daily as needed for allergies. 16 g 0   furosemide (LASIX) 40 MG tablet TAKE 1 TABLET BY MOUTH TWICE DAILY AS DIRECTED 60 tablet 6   hydrALAZINE (APRESOLINE) 25 MG tablet TAKE 1 TABLET BY MOUTH THREE TIMES DAILY 270 tablet 0   lansoprazole (PREVACID) 30 MG capsule TAKE 1 CAPSULE BY MOUTH ONCE DAILY BEFORE BREAKFAST (Patient taking differently: Take 30 mg by mouth daily before breakfast.) 90 capsule 3   ondansetron (ZOFRAN) 4 MG tablet Take 1 tablet (4 mg total) by mouth every 8 (eight) hours as needed for nausea or vomiting. 20 tablet 0    rivaroxaban (XARELTO) 20 MG TABS tablet TAKE 1 TABLET BY MOUTH ONCE DAILY WITH SUPPER 90 tablet 1   SYNTHROID 125 MCG tablet Take 1 tablet by mouth once daily (Patient taking differently: Take 125 mcg by mouth daily before breakfast.) 30 tablet 11   traMADol (ULTRAM) 50 MG tablet Take 1 tablet (50 mg total) by mouth every 6 (six) hours as needed for moderate pain. (Patient taking differently: Take 50 mg by mouth every 12 (twelve) hours as needed for moderate pain or severe pain.) 30 tablet 0   triamcinolone cream (KENALOG) 0.1 % Apply 1 application topically daily as needed (Breakout on legs).     potassium chloride SA (KLOR-CON) 20 MEQ tablet Take 1 tablet (20 mEq total) by mouth 2 (two) times daily for 3 days. (Patient not taking: Reported on 03/22/2021) 6 tablet 0   No facility-administered medications prior  to visit.     Allergies:   Ciprofloxacin, Dexlansoprazole, Statins, and Amoxicillin   Social History   Socioeconomic History   Marital status: Widowed    Spouse name: Not on file   Number of children: 2   Years of education: Not on file   Highest education level: Not on file  Occupational History   Occupation: retired  Tobacco Use   Smoking status: Never   Smokeless tobacco: Never  Vaping Use   Vaping Use: Never used  Substance and Sexual Activity   Alcohol use: No    Alcohol/week: 0.0 standard drinks   Drug use: No   Sexual activity: Not on file  Other Topics Concern   Not on file  Social History Narrative   Widowed         Social Determinants of Health   Financial Resource Strain: Not on file  Food Insecurity: Not on file  Transportation Needs: Not on file  Physical Activity: Not on file  Stress: Not on file  Social Connections: Not on file     Family History:  The patient's family history includes Atrial fibrillation in an other family member; Brain cancer in her mother; Heart disease in her mother; Hypertension in her mother; Stroke in her brother and mother.    Review of Systems:    Please see the history of present illness.     All other systems reviewed and are otherwise negative except as noted above.   Physical Exam:    VS:  BP (!) 142/88    Pulse 68    Ht 5\' 7"  (1.702 m)    Wt 253 lb (114.8 kg)    SpO2 98%    BMI 39.63 kg/m    General: Pleasant elderly female appearing in no acute distress. Head: Normocephalic, atraumatic. Neck: No carotid bruits. JVD not elevated.  Lungs: Respirations regular and unlabored, without wheezes or rales.  Heart: Regular rate and rhythm. No S3 or S4.  No murmur, no rubs, or gallops appreciated. Abdomen: Appears non-distended. No obvious abdominal masses. Msk:  Strength and tone appear normal for age. No obvious joint deformities or effusions. Extremities: No clubbing or cyanosis. Chronic appearing lymphedema. Distal pedal pulses are 2+ bilaterally. Neuro: Alert and oriented X 3. Moves all extremities spontaneously. No focal deficits noted. Psych:  Responds to questions appropriately with a normal affect. Skin: No rashes or lesions noted  Wt Readings from Last 3 Encounters:  08/25/21 253 lb (114.8 kg)  04/06/21 240 lb 12.8 oz (109.2 kg)  03/28/21 240 lb 12.8 oz (109.2 kg)     Studies/Labs Reviewed:   EKG:  EKG is not ordered today.    Recent Labs: 12/20/2020: B Natriuretic Peptide 188.0; BUN 13; Creatinine, Ser 1.02; Hemoglobin 12.8; Magnesium 2.0; Platelets 174; Potassium 3.0; Sodium 135; TSH 0.718   Lipid Panel    Component Value Date/Time   CHOL 205 (H) 07/21/2019 0904   TRIG 77 07/21/2019 0904   HDL 47 (L) 07/21/2019 0904   CHOLHDL 4.4 07/21/2019 0904   LDLCALC 141 (H) 07/21/2019 0904    Additional studies/ records that were reviewed today include:   Echocardiogram: 02/2020 IMPRESSIONS     1. Left ventricular ejection fraction, by estimation, is 55 to 60%. The  left ventricle has normal function. The left ventricle has no regional  wall motion abnormalities. There is mild left  ventricular hypertrophy.  Left ventricular diastolic parameters  are consistent with Grade I diastolic dysfunction (impaired relaxation).   2. Right ventricular  systolic function is normal. The right ventricular  size is normal. There is normal pulmonary artery systolic pressure.   3. Left atrial size was mildly dilated.   4. The mitral valve is normal in structure. No evidence of mitral valve  regurgitation. No evidence of mitral stenosis.   5. The aortic valve has an indeterminant number of cusps. Aortic valve  regurgitation is not visualized. No aortic stenosis is present.   6. The inferior vena cava is normal in size with greater than 50%  respiratory variability, suggesting right atrial pressure of 3 mmHg.   Assessment:    1. Nonischemic cardiomyopathy (HCC)   2. Lymphedema   3. Biventricular ICD (implantable cardioverter-defibrillator) in place   4. Paroxysmal atrial fibrillation (HCC)   5. Essential hypertension      Plan:   In order of problems listed above:  1. HFimpEF/Lymphedema - She had a previous EF of 35% in 2008 but echocardiogram in 02/2020 showed her EF had normalized. She does have chronic appearing lymphedema on examination but feels like her symptoms have progressed over the past several months. She reports good compliance with Lasix 40 mg twice daily and does try to limit her sodium intake. Reviewed options and will refer to PT for consideration of wraps and further management of her lymphedema. Will continue Lasix at current dosing as her creatinine was stable at 1.11 in 06/2021.   2. ICD in Place - She is s/p Medtronic biventricular ICD placement and most recent device interrogation in 06/2021 showed her device was functioning normally. Followed by Dr. Lovena Le.  3. Paroxysmal Atrial Fibrillation - She denies any recent palpitations and remains on Coreg 37.5 mg twice daily for rate control. - No reports of active bleeding. She is on Xarelto 20 mg daily for  anticoagulation which is the appropriate dose at this time given her weight and recent labs by 06/2021 with calculated creatinine clearance at ~ 72.   4. HTN - BP was initially elevated at 160/80, rechecked and improved to 142/88.  Will continue her current regimen for now with Coreg, Benazepril, Lasix and Hydralazine. I did encourage her to follow BP at home if able and report back if this remains elevated as Hydralazine could be further titrated in the future if needed  Medication Adjustments/Labs and Tests Ordered: Current medicines are reviewed at length with the patient today.  Concerns regarding medicines are outlined above.  Medication changes, Labs and Tests ordered today are listed in the Patient Instructions below. Patient Instructions  Medication Instructions:  Continue current medication regimen.   Labwork: None.   Testing/Procedures: None.   Follow-Up: Your physician recommends that you schedule a follow-up appointment in: 6 months with Dr. Lovena Le and 1 year with Dr. Domenic Polite.   Any Other Special Instructions Will Be Listed Below (If Applicable).  You have been referred to Physical Therapy for treatment of lymphedema.   If you need a refill on your cardiac medications before your next appointment, please call your pharmacy.   Signed, Erma Heritage, PA-C  08/25/2021 4:40 PM    [place] [address] [place], [place] [address] Phone: [phone] Fax: [phone]

## 2021-08-25 NOTE — Patient Instructions (Addendum)
Medication Instructions:  Continue current medication regimen.   Labwork: None.   Testing/Procedures: None.   Follow-Up: Your physician recommends that you schedule a follow-up appointment in: 6 months with Dr. Lovena Le and 1 year with Dr. Domenic Polite.   Any Other Special Instructions Will Be Listed Below (If Applicable).  You have been referred to Physical Therapy for treatment of lymphedema.   If you need a refill on your cardiac medications before your next appointment, please call your pharmacy.

## 2021-09-01 ENCOUNTER — Other Ambulatory Visit: Payer: Self-pay | Admitting: Gastroenterology

## 2021-09-01 NOTE — Telephone Encounter (Signed)
Needs office visit to refill next time.

## 2021-09-05 ENCOUNTER — Encounter: Payer: Self-pay | Admitting: Internal Medicine

## 2021-09-05 DIAGNOSIS — M79674 Pain in right toe(s): Secondary | ICD-10-CM | POA: Diagnosis not present

## 2021-09-08 ENCOUNTER — Ambulatory Visit: Payer: Medicare HMO | Admitting: Specialist

## 2021-09-09 ENCOUNTER — Other Ambulatory Visit: Payer: Self-pay | Admitting: Cardiology

## 2021-09-20 ENCOUNTER — Ambulatory Visit (INDEPENDENT_AMBULATORY_CARE_PROVIDER_SITE_OTHER): Payer: Medicare HMO

## 2021-09-20 DIAGNOSIS — I428 Other cardiomyopathies: Secondary | ICD-10-CM

## 2021-09-20 LAB — CUP PACEART REMOTE DEVICE CHECK
Battery Remaining Longevity: 109 mo
Battery Voltage: 3.03 V
Brady Statistic AP VP Percent: 72.58 %
Brady Statistic AP VS Percent: 0.3 %
Brady Statistic AS VP Percent: 19.01 %
Brady Statistic AS VS Percent: 8.11 %
Brady Statistic RA Percent Paced: 77.6 %
Brady Statistic RV Percent Paced: 88.49 %
Date Time Interrogation Session: 20230307004006
Implantable Lead Implant Date: 20150417
Implantable Lead Implant Date: 20150417
Implantable Lead Implant Date: 20150417
Implantable Lead Location: 753858
Implantable Lead Location: 753859
Implantable Lead Location: 753860
Implantable Lead Model: 4298
Implantable Lead Model: 5076
Implantable Lead Model: 6935
Implantable Pulse Generator Implant Date: 20220603
Lead Channel Impedance Value: 285 Ohm
Lead Channel Impedance Value: 285 Ohm
Lead Channel Impedance Value: 304 Ohm
Lead Channel Impedance Value: 304 Ohm
Lead Channel Impedance Value: 323 Ohm
Lead Channel Impedance Value: 323 Ohm
Lead Channel Impedance Value: 323 Ohm
Lead Channel Impedance Value: 342 Ohm
Lead Channel Impedance Value: 456 Ohm
Lead Channel Impedance Value: 494 Ohm
Lead Channel Impedance Value: 494 Ohm
Lead Channel Impedance Value: 494 Ohm
Lead Channel Impedance Value: 532 Ohm
Lead Channel Impedance Value: 551 Ohm
Lead Channel Pacing Threshold Amplitude: 0.625 V
Lead Channel Pacing Threshold Amplitude: 1 V
Lead Channel Pacing Threshold Amplitude: 1.375 V
Lead Channel Pacing Threshold Pulse Width: 0.4 ms
Lead Channel Pacing Threshold Pulse Width: 0.4 ms
Lead Channel Pacing Threshold Pulse Width: 0.6 ms
Lead Channel Sensing Intrinsic Amplitude: 1.625 mV
Lead Channel Sensing Intrinsic Amplitude: 1.625 mV
Lead Channel Sensing Intrinsic Amplitude: 10.125 mV
Lead Channel Sensing Intrinsic Amplitude: 10.125 mV
Lead Channel Setting Pacing Amplitude: 1.5 V
Lead Channel Setting Pacing Amplitude: 2 V
Lead Channel Setting Pacing Amplitude: 2.25 V
Lead Channel Setting Pacing Pulse Width: 0.4 ms
Lead Channel Setting Pacing Pulse Width: 0.6 ms
Lead Channel Setting Sensing Sensitivity: 0.9 mV

## 2021-10-03 NOTE — Progress Notes (Signed)
Remote pacemaker transmission.   

## 2021-10-14 DIAGNOSIS — E782 Mixed hyperlipidemia: Secondary | ICD-10-CM | POA: Diagnosis not present

## 2021-10-14 DIAGNOSIS — E039 Hypothyroidism, unspecified: Secondary | ICD-10-CM | POA: Diagnosis not present

## 2021-10-14 DIAGNOSIS — R7303 Prediabetes: Secondary | ICD-10-CM | POA: Diagnosis not present

## 2021-10-18 ENCOUNTER — Ambulatory Visit (HOSPITAL_COMMUNITY): Payer: Medicare HMO | Attending: Student | Admitting: Physical Therapy

## 2021-10-18 ENCOUNTER — Encounter (HOSPITAL_COMMUNITY): Payer: Self-pay | Admitting: Physical Therapy

## 2021-10-18 DIAGNOSIS — I890 Lymphedema, not elsewhere classified: Secondary | ICD-10-CM | POA: Insufficient documentation

## 2021-10-18 NOTE — Therapy (Signed)
?OUTPATIENT PHYSICAL THERAPY ONCOLOGY EVALUATION ? ?Patient Name: [name] ?MRN: [id number] ?DOB:[birth year], 81 y.o., female ?Today's Date: 10/18/2021 ? ? PT End of Session - 10/18/21 1048   ? ? Visit Number 1   ? Number of Visits 18   ? Date for PT Re-Evaluation 11/29/21   ? Authorization Type humana requested 12   ? PT Start Time 1050   ? PT Stop Time 2297   ? PT Time Calculation (min) 85 min   ? ?  ?  ? ?  ? ? ?Past Medical History:  ?Diagnosis Date  ? Allergy   ? Anemia   ? Atrial fibrillation ([place])   ? Cardiomyopathy, nonischemic ([place])   ? a. EF previously 35% in 2008 with cath showing no significant CAD b. EF improved to 55-60% in 02/2020  ? Cataract   ? Cerebrovascular accident [facility])   ? No clinical event; asymptomatic CT finding  ? CHF (congestive heart failure) ([place])   ? Chronic kidney disease   ? COPD (chronic obstructive pulmonary disease) ([place])   ? DDD (degenerative disc disease), cervical   ? Cervical  ? Degenerative joint disease   ? Right hip and knee  ? Essential hypertension   ? GERD (gastroesophageal reflux disease)   ? Hip dislocation, right ([place])   ? Hyperlipemia   ? Hypothyroid   ? LBBB (left bundle branch block)   ? Pacemaker   ? S/P colonoscopy 2009  ? Dr. Sharlett Iles: reportedly normal per pt  ? S/P endoscopy 1980s  ? ?Past Surgical History:  ?Procedure Laterality Date  ? ABDOMINAL HYSTERECTOMY    ? APPENDECTOMY    ? BI-VENTRICULAR IMPLANTABLE CARDIOVERTER DEFIBRILLATOR  (CRT-D)  10-31-2013  ? MDT Hillery Aldo CRTD implanted by Dr Lovena Le  ? BIOPSY  12/18/2018  ? Procedure: BIOPSY;  Surgeon: Daneil Dolin, MD;  [facility];  Service: Endoscopy;;  Gastric ?  ? BIV PACEMAKER INSERTION CRT-P N/A 12/17/2020  ? Procedure: DOWNGRAD TO BIV PACEMAKER INSERTION CRT-P;  Surgeon: Evans Lance, MD;  Location: [place] CV LAB;  Service: Cardiovascular;  Laterality: N/A;  ? BREAST BIOPSY    ? bilaterally; benign  ? can not have MRI due to jaw surgery    ? CATARACT EXTRACTION W/PHACO Left  03/28/2021  ? Procedure: CATARACT EXTRACTION PHACO AND INTRAOCULAR LENS PLACEMENT (IOC);  Surgeon: Baruch Goldmann, MD;  Location: [facility] ORS;  Service: Ophthalmology;  Laterality: Left;  CDE  17.17  ? CATARACT EXTRACTION W/PHACO Right 04/11/2021  ? Procedure: CATARACT EXTRACTION PHACO AND INTRAOCULAR LENS PLACEMENT RIGHT EYE;  Surgeon: Baruch Goldmann, MD;  Location: [facility] ORS;  Service: Ophthalmology;  Laterality: Right;  right ?CDE=17.03  ? CHOLECYSTECTOMY    ? COLONOSCOPY  2009  ? Dr. Sharlett Iles: Normal  ? ESOPHAGOGASTRODUODENOSCOPY  07/13/2011  ? small hh, patchy gastric erythema with scarring deformity of antrum, chronic gastritis, bx benign with no H.Pylori. Procedure: ESOPHAGOGASTRODUODENOSCOPY (EGD);  Surgeon: Daneil Dolin, MD;  [facility];  Service: Endoscopy;  Laterality: N/A;  10:45  ? ESOPHAGOGASTRODUODENOSCOPY N/A 12/18/2018  ? Dr. Gala Romney: normal esophagus s/p dilation, small hh, erythema in stomach with benign biopsy.   ? IMPLANTABLE CARDIOVERTER DEFIBRILLATOR IMPLANT N/A 10/31/2013  ? Procedure: IMPLANTABLE CARDIOVERTER DEFIBRILLATOR IMPLANT;  Surgeon: Evans Lance, MD;  Location: [facility] CATH LAB;  Service: Cardiovascular;  Laterality: N/A;  ? MALONEY DILATION N/A 12/18/2018  ? Procedure: MALONEY DILATION;  Surgeon: Daneil Dolin, MD;  [facility];  Service: Endoscopy;  Laterality: N/A;  ? MANDIBLE SURGERY    ? secondary to malocclusion  ? 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 Ureteral Surgery  1970  ? ?Patient Active Problem List  ? Diagnosis Date Noted  ? Vitamin D deficiency 07/14/2019  ? Esophageal dysphagia 12/10/2018  ? RUQ pain 09/17/2017  ? Right flank pain 09/17/2017  ? Biventricular ICD (implantable cardioverter-defibrillator) in place 11/17/2013  ? Atrial fibrillation ([place]) 11/17/2013  ? Chronic systolic heart failure ([place]) 10/13/2013  ? Hyponatremia 03/04/2013  ? Constipation 09/13/2011  ? GERD 09/13/2010  ? Hypothyroidism 04/28/2009  ? Hyperlipidemia 04/28/2009  ? Hypertension  04/28/2009  ? COPD (chronic obstructive pulmonary disease) ([place]) 04/28/2009  ? LEFT BUNDLE BRANCH BLOCK 04/28/2009  ? DEGENERATIVE JOINT DISEASE 04/28/2009  ? ? ?PCP: Celene Squibb, MD ? ?REFERRING PROVIDER: Erma Heritage, PA* ? ?REFERRING DIAG: lymphedema  ? ?THERAPY DIAG:  ?Lymphedema, not elsewhere classified ? ?ONSET DATE: chronic ? ?SUBJECTIVE                                                                                                                                                                                          ? ?SUBJECTIVE STATEMENT:  Ms [name] states that her legs have been swelling for about 8 months.  At this time her legs are weeping.  The swelling is not as much into her feet but is in her thighs and legs.  She states that her sister and Uncle have lymphedema.  She is having more difficulty getting her shoes on but she also has hip problems that is affecting this as well.   ? ?PERTINENT HISTORY: [name] is a 81 y.o. female with past medical history of HFimpEF (EF previously 35% in 2008 with cath showing no significant CAD, at 55-60% in 02/2020), LBBB, s/p Medtronic biventricular ICD placement, paroxysmal atrial fibrillation, HTN, GERD and DJD  ? ?PAIN:  ?Are you having pain? Yes needs surgery but is not a candidate.  ?NPRS scale: 10/10 ?Pain location: both but RT greater  ?Pain orientation: Bilateral  ?PAIN TYPE: aching ?Pain description: constant  ?Aggravating factors: Wt bearing  ?Relieving factors: shots  ?This is not what we are seeing pt for therefore we will not address this in our treatment plan.   ? ?PRECAUTIONS: Other: cellulitis  ? ?WEIGHT BEARING RESTRICTIONS No ? ?FALLS:  ?Has patient fallen in last 6 months? No ? ?LIVING ENVIRONMENT: ?Lives with: lives with their family and lives with their son ?Lives in: House/apartment ?Stairs: No ?Has following equipment at home: Gilford Rile - 2 wheeled ? ?OCCUPATION: retired  ? ?LEISURE: would love to get back planting flowers  ? ?   ?  PRIOR LEVEL OF FUNCTION: Independent with basic ADLs ? ?PATIENT GOALS less edema, to move better  ? ? ?OBJECTIVE ? ?COGNITION: ? Overall cognitive status: Within functional limits for tasks assessed  ? ?PALPATION: Increased induration posterior aspect of B LE  and thighs  ? ?OBSERVATIONS / OTHER ASSESSMENTS: weeping noted B, induration,  ?Paplliomas B  ? ? ?LYMPHEDEMA ASSESSMENTS:  ? ?INFECTIONS: no ? ?LYMPHEDEMA ASSESSMENTS:  ? ?LE LANDMARK RIGHT ?10/18/2021  ?At groin   ?30 cm proximal to suprapatella   ?20 cm proximal to suprapatella   70  ?10 cm proximal to suprapatella   63 ?  ?At midpatella / popliteal crease   53.5  ?30 cm proximal to floor at lateral plantar foot   45.6  ?20 cm proximal to floor at lateral plantar foot   39.3  ?10 cm proximal to floor at lateral plantar foot   35  ?Circumference of ankle/heel   38  ?5 cm proximal to 1st MTP joint   26  ?Across MTP joint   24.3  ?Around proximal great toe   ?(Blank rows = not tested) ? ?LE LANDMARK LEFT ?10/18/2021  ?At groin   ?30 cm proximal to suprapatella   ?20 cm proximal to suprapatella   66.2  ?10 cm proximal to suprapatella   61.2  ?At [place] / popliteal crease   52.1  ?30 cm proximal to floor at lateral plantar foot   47  ?20 cm proximal to floor at lateral plantar foot   41.5  ?10 cm proximal to floor at lateral plantar foot   37.3  ?Circumference of ankle/heel   39  ?5 cm proximal to 1st MTP joint   26.7  ?Across MTP joint  25  ?Around proximal great toe   ?(Blank rows = not tested) ? ? ? ? ? ? ? ?TODAY'S TREATMENT  ?EVal:  Education on what lymphedema is, Four aspects of treatment to include cleansing and moisturizing the skin, exercises, manual decongestive techniques ?And compression bandaging.  ? ?PATIENT EDUCATION:  ?Education details: where to buy the bandages, exercises and the importance of coming 3x a week  ?Person educated: Patient ?Education method: Explanation, Tactile cues, Verbal cues, and Handouts ?Education comprehension:  verbalized understanding ? ? ?HOME EXERCISE PROGRAM: ?Ankle pumps, LAQ, hip ab/adduction, marchingm diaphragm breathe and lymph squeeze.  ? ?ASSESSMENT: ? ?CLINICAL IMPRESSION: ?Patient is a 81 y.o. Female  who was seen toda

## 2021-10-20 ENCOUNTER — Encounter (HOSPITAL_COMMUNITY): Payer: Medicare HMO | Admitting: Physical Therapy

## 2021-10-20 ENCOUNTER — Encounter (HOSPITAL_COMMUNITY): Payer: Self-pay | Admitting: Physical Therapy

## 2021-10-20 DIAGNOSIS — N1831 Chronic kidney disease, stage 3a: Secondary | ICD-10-CM | POA: Diagnosis not present

## 2021-10-20 DIAGNOSIS — I10 Essential (primary) hypertension: Secondary | ICD-10-CM | POA: Diagnosis not present

## 2021-10-20 DIAGNOSIS — R7303 Prediabetes: Secondary | ICD-10-CM | POA: Diagnosis not present

## 2021-10-20 DIAGNOSIS — I482 Chronic atrial fibrillation, unspecified: Secondary | ICD-10-CM | POA: Diagnosis not present

## 2021-10-20 DIAGNOSIS — I5022 Chronic systolic (congestive) heart failure: Secondary | ICD-10-CM | POA: Diagnosis not present

## 2021-10-20 DIAGNOSIS — G894 Chronic pain syndrome: Secondary | ICD-10-CM | POA: Diagnosis not present

## 2021-10-20 DIAGNOSIS — I429 Cardiomyopathy, unspecified: Secondary | ICD-10-CM | POA: Diagnosis not present

## 2021-10-20 DIAGNOSIS — E782 Mixed hyperlipidemia: Secondary | ICD-10-CM | POA: Diagnosis not present

## 2021-10-20 DIAGNOSIS — M545 Low back pain, unspecified: Secondary | ICD-10-CM | POA: Diagnosis not present

## 2021-10-20 NOTE — Therapy (Unsigned)
[facility] ?[facility] ?[address] ?[place], [name], [address] ?Phone: [phone]   Fax:  [phone] ? ?Patient Details  ?Name: [name] ?MRN: [id number] ?Date of Birth: 10/16/1940 ?Referring Provider: Rozann Lesches ? ?Encounter Date: 10/20/2021 ? ?Satira Sark, MD  Leeroy Cha, PT ?Thank you for letting me know.  She does not have a contraindication for compressive therapy for lymphedema even with her cardiomyopathy history.  I am not actually covering the hospital right now, if she needs a specific order it could be obtained from the inpatient team.  It would be fine for her to have a compression pump if you think that is what she needs and she will have regular follow-up with PT for this.   ?  ?   ?Previous Messages ?  ?----- Message -----  ?From: Leeroy Cha, PT  ?Sent: 10/18/2021   1:33 PM EDT  ?To: Satira Sark, MD  ?Subject: Lymphedema treatment/pump                      ? ?Good afternoon Dr. Domenic Polite.  I was consulted on a mutual patient for lymphedema.  Due to her hx of CHF I wanted to acquire medical clearance prior to starting total decongestive techniques on her legs.    ? ?Also, if you do approve treatment will you approve a compression pump at discharge so that she can maintain the loss of fluid that we obtain.  ? ?Thank you for your consideration in this matter.  ? ? ?Rayetta Humphrey, PT CLT  ?[phone]   ? ?Rayetta Humphrey, PT CLT ?[phone]  ?10/20/2021, 8:57 AM ? ?[facility] ?[facility] ?[address] ?[place], [name], [address] ?Phone: [phone]   Fax:  [phone] ?

## 2021-10-21 ENCOUNTER — Ambulatory Visit (HOSPITAL_COMMUNITY): Payer: Medicare HMO

## 2021-10-21 ENCOUNTER — Encounter (HOSPITAL_COMMUNITY): Payer: Self-pay

## 2021-10-21 DIAGNOSIS — I890 Lymphedema, not elsewhere classified: Secondary | ICD-10-CM | POA: Diagnosis not present

## 2021-10-21 NOTE — Therapy (Signed)
?OUTPATIENT PHYSICAL THERAPY ONCOLOGY TREATMENT ? ?Patient Name: [name] ?MRN: [id number] ?DOB:1940/08/02, 81 y.o., female ?Today's Date: 10/21/2021 ? ? PT End of Session - 10/21/21 1437   ? ? Visit Number 2   ? Number of Visits 18   ? Date for PT Re-Evaluation 11/29/21   ? Authorization Type humana requested 12   ? Authorization Time Period 12 visits from 4/4/-->11/18/21   ? Authorization - Visit Number 2   ? Authorization - Number of Visits 12   ? PT Start Time 1320   ? PT Stop Time 1420   ? PT Time Calculation (min) 60 min   ? Activity Tolerance Patient tolerated treatment well   ? Behavior During Therapy [facility] for tasks assessed/performed   ? ?  ?  ? ?  ? ? ? ?Past Medical History:  ?Diagnosis Date  ? Allergy   ? Anemia   ? Atrial fibrillation ([place])   ? Cardiomyopathy, nonischemic ([place])   ? a. EF previously 35% in 2008 with cath showing no significant CAD b. EF improved to 55-60% in 02/2020  ? Cataract   ? Cerebrovascular accident [facility])   ? No clinical event; asymptomatic CT finding  ? CHF (congestive heart failure) ([place])   ? Chronic kidney disease   ? COPD (chronic obstructive pulmonary disease) ([place])   ? DDD (degenerative disc disease), cervical   ? Cervical  ? Degenerative joint disease   ? Right hip and knee  ? Essential hypertension   ? GERD (gastroesophageal reflux disease)   ? Hip dislocation, right ([place])   ? Hyperlipemia   ? Hypothyroid   ? LBBB (left bundle branch block)   ? Pacemaker   ? S/P colonoscopy 2009  ? Dr. Sharlett Iles: reportedly normal per pt  ? S/P endoscopy 1980s  ? ?Past Surgical History:  ?Procedure Laterality Date  ? ABDOMINAL HYSTERECTOMY    ? APPENDECTOMY    ? BI-VENTRICULAR IMPLANTABLE CARDIOVERTER DEFIBRILLATOR  (CRT-D)  10-31-2013  ? MDT Hillery Aldo CRTD implanted by Dr Lovena Le  ? BIOPSY  12/18/2018  ? Procedure: BIOPSY;  Surgeon: Daneil Dolin, MD;  [facility];  Service: Endoscopy;;  Gastric ?  ? BIV PACEMAKER INSERTION CRT-P N/A 12/17/2020  ? Procedure: DOWNGRAD TO BIV  PACEMAKER INSERTION CRT-P;  Surgeon: Evans Lance, MD;  Location: [place] CV LAB;  Service: Cardiovascular;  Laterality: N/A;  ? BREAST BIOPSY    ? bilaterally; benign  ? can not have MRI due to jaw surgery    ? CATARACT EXTRACTION W/PHACO Left 03/28/2021  ? Procedure: CATARACT EXTRACTION PHACO AND INTRAOCULAR LENS PLACEMENT (IOC);  Surgeon: Baruch Goldmann, MD;  Location: [facility] ORS;  Service: Ophthalmology;  Laterality: Left;  CDE  17.17  ? CATARACT EXTRACTION W/PHACO Right 04/11/2021  ? Procedure: CATARACT EXTRACTION PHACO AND INTRAOCULAR LENS PLACEMENT RIGHT EYE;  Surgeon: Baruch Goldmann, MD;  Location: [facility];  Service: Ophthalmology;  Laterality: Right;  right ?CDE=17.03  ? CHOLECYSTECTOMY    ? COLONOSCOPY  2009  ? Dr. Sharlett Iles: Normal  ? ESOPHAGOGASTRODUODENOSCOPY  07/13/2011  ? small hh, patchy gastric erythema with scarring deformity of antrum, chronic gastritis, bx benign with no H.Pylori. Procedure: ESOPHAGOGASTRODUODENOSCOPY (EGD);  Surgeon: Daneil Dolin, MD;  [facility];  Service: Endoscopy;  Laterality: N/A;  10:45  ? ESOPHAGOGASTRODUODENOSCOPY N/A 12/18/2018  ? Dr. Gala Romney: normal esophagus s/p dilation, small hh, erythema in stomach with benign biopsy.   ? IMPLANTABLE CARDIOVERTER DEFIBRILLATOR IMPLANT N/A 10/31/2013  ?  Procedure: IMPLANTABLE CARDIOVERTER DEFIBRILLATOR IMPLANT;  Surgeon: Evans Lance, MD;  Location: [facility] CATH LAB;  Service: Cardiovascular;  Laterality: N/A;  ? MALONEY DILATION N/A 12/18/2018  ? Procedure: MALONEY DILATION;  Surgeon: Daneil Dolin, MD;  [facility];  Service: Endoscopy;  Laterality: N/A;  ? MANDIBLE SURGERY    ? secondary to malocclusion  ? 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 Ureteral Surgery  1970  ? ?Patient Active Problem List  ? Diagnosis Date Noted  ? Vitamin D deficiency 07/14/2019  ? Esophageal dysphagia 12/10/2018  ? RUQ pain 09/17/2017  ? Right flank pain 09/17/2017  ? Biventricular ICD (implantable cardioverter-defibrillator) in place  11/17/2013  ? Atrial fibrillation ([place]) 11/17/2013  ? Chronic systolic heart failure ([place]) 10/13/2013  ? Hyponatremia 03/04/2013  ? Constipation 09/13/2011  ? GERD 09/13/2010  ? Hypothyroidism 04/28/2009  ? Hyperlipidemia 04/28/2009  ? Hypertension 04/28/2009  ? COPD (chronic obstructive pulmonary disease) ([place]) 04/28/2009  ? LEFT BUNDLE BRANCH BLOCK 04/28/2009  ? DEGENERATIVE JOINT DISEASE 04/28/2009  ? ? ?PCP: Celene Squibb, MD ? ?REFERRING PROVIDER: Erma Heritage, PA-C ? ?REFERRING DIAG: lymphedema  ? ?THERAPY DIAG:  ?Lymphedema, not elsewhere classified ? ?ONSET DATE: chronic ? ?SUBJECTIVE                                                                                                                                                                                          ? ?SUBJECTIVE STATEMENT:  Pt stated she has constant Bil hip pain with standing 10/10, reduces 5/10 in seated position.  Has apt with orthopedic MD next week and wishes to hold on therapy til the 17th.  She has 2 apts with [place] next week and wishes to only be wrapped and will begin massage on the 17th when pain is reduced.  Pt reports she had to remove outer layer of profore on Lt due to sliding down and pain ful. ? ?PERTINENT HISTORY: [name] is a 81 y.o. female with past medical history of HFimpEF (EF previously 35% in 2008 with cath showing no significant CAD, at 55-60% in 02/2020), LBBB, s/p Medtronic biventricular ICD placement, paroxysmal atrial fibrillation, HTN, GERD and DJD  ? ?PAIN:  ?Are you having pain? Yes needs surgery but is not a candidate.  ?NPRS scale: 10/10 ?Pain location: both but RT greater  ?Pain orientation: Bilateral  ?PAIN TYPE: aching ?Pain description: constant  ?Aggravating factors: Wt bearing  ?Relieving factors: shots  ?This is not what we are seeing pt for therefore we will not address this in our treatment plan.   ? ?PRECAUTIONS: Other: cellulitis  ? ?WEIGHT  BEARING RESTRICTIONS No ? ?FALLS:  ?Has patient  fallen in last 6 months? No ? ?LIVING ENVIRONMENT: ?Lives with: lives with their family and lives with their son ?Lives in: House/apartment ?Stairs: No ?Has following equipment at home: Gilford Rile - 2 wheeled ? ?OCCUPATION: retired  ? ?LEISURE: would love to get back planting flowers  ? ?  ?PRIOR LEVEL OF FUNCTION: Independent with basic ADLs ? ?PATIENT GOALS less edema, to move better  ? ? ?OBJECTIVE ? ?COGNITION: ? Overall cognitive status: Within functional limits for tasks assessed  ? ?PALPATION: Increased induration posterior aspect of B LE  and thighs  ? ?OBSERVATIONS / OTHER ASSESSMENTS: weeping noted B, induration,  ?Paplliomas B  ? ? ?LYMPHEDEMA ASSESSMENTS:  ? ?INFECTIONS: no ? ?LYMPHEDEMA ASSESSMENTS:  ? ?LE LANDMARK RIGHT ?10/21/2021  ?At groin   ?30 cm proximal to suprapatella   ?20 cm proximal to suprapatella   70  ?10 cm proximal to suprapatella   63 ?  ?At midpatella / popliteal crease   53.5  ?30 cm proximal to floor at lateral plantar foot   45.6  ?20 cm proximal to floor at lateral plantar foot   39.3  ?10 cm proximal to floor at lateral plantar foot   35  ?Circumference of ankle/heel   38  ?5 cm proximal to 1st MTP joint   26  ?Across MTP joint   24.3  ?Around proximal great toe   ?(Blank rows = not tested) ? ?LE LANDMARK LEFT ?10/21/2021  ?At groin   ?30 cm proximal to suprapatella   ?20 cm proximal to suprapatella   66.2  ?10 cm proximal to suprapatella   61.2  ?At [place] / popliteal crease   52.1  ?30 cm proximal to floor at lateral plantar foot   47  ?20 cm proximal to floor at lateral plantar foot   41.5  ?10 cm proximal to floor at lateral plantar foot   37.3  ?Circumference of ankle/heel   39  ?5 cm proximal to 1st MTP joint   26.7  ?Across MTP joint  25  ?Around proximal great toe   ?(Blank rows = not tested) ? ? ? ? ? ? ? ?TODAY'S TREATMENT  ?10/21/21 ?Reviewed 4 aspect of treatment.  Educated on benefits with manual for edema and pain control. ?Discussed bandages to purchase ? ? 10/21/21 0001   ?Manual Therapy  ?Manual Therapy Compression Bandaging  ?Manual therapy comments Manual complete separate than rest of tx  ?Compression Bandaging Cut 1/2in foam and added to profore BLE  ? ? ?EVal:  Education

## 2021-10-24 ENCOUNTER — Encounter (HOSPITAL_COMMUNITY): Payer: Medicare HMO | Admitting: Physical Therapy

## 2021-10-26 ENCOUNTER — Encounter (HOSPITAL_COMMUNITY): Payer: Medicare HMO | Admitting: Physical Therapy

## 2021-10-26 ENCOUNTER — Telehealth (HOSPITAL_COMMUNITY): Payer: Self-pay | Admitting: Physical Therapy

## 2021-10-26 NOTE — Telephone Encounter (Signed)
Pt did not show for appt.  States she tried to call the clinic but did not get an answer.  STates she had to remove her bandages because of possible allergic reaction either to the foam or the "gel" that was put on her leg.  STates her Rt LE started burning Sunday and then her Lt and she removed them to find her legs red and with small bumps.  States no heat, edema or signs of cellulitis.  State she will be back on Monday. ? ?Roseanne Reno, PTA, CLT ?11:34 am ?

## 2021-10-28 ENCOUNTER — Encounter: Payer: Self-pay | Admitting: Specialist

## 2021-10-28 ENCOUNTER — Encounter (HOSPITAL_COMMUNITY): Payer: Medicare HMO

## 2021-10-28 ENCOUNTER — Ambulatory Visit: Payer: Medicare HMO

## 2021-10-28 ENCOUNTER — Ambulatory Visit: Payer: Self-pay

## 2021-10-28 ENCOUNTER — Ambulatory Visit (INDEPENDENT_AMBULATORY_CARE_PROVIDER_SITE_OTHER): Payer: Medicare HMO | Admitting: Specialist

## 2021-10-28 VITALS — BP 151/79 | HR 75 | Ht 67.0 in | Wt 235.0 lb

## 2021-10-28 DIAGNOSIS — M48062 Spinal stenosis, lumbar region with neurogenic claudication: Secondary | ICD-10-CM

## 2021-10-28 DIAGNOSIS — M25552 Pain in left hip: Secondary | ICD-10-CM | POA: Diagnosis not present

## 2021-10-28 DIAGNOSIS — M25551 Pain in right hip: Secondary | ICD-10-CM

## 2021-10-28 DIAGNOSIS — M7062 Trochanteric bursitis, left hip: Secondary | ICD-10-CM | POA: Diagnosis not present

## 2021-10-28 DIAGNOSIS — M5136 Other intervertebral disc degeneration, lumbar region: Secondary | ICD-10-CM | POA: Diagnosis not present

## 2021-10-28 DIAGNOSIS — R609 Edema, unspecified: Secondary | ICD-10-CM | POA: Diagnosis not present

## 2021-10-28 DIAGNOSIS — M7061 Trochanteric bursitis, right hip: Secondary | ICD-10-CM | POA: Diagnosis not present

## 2021-10-28 NOTE — Patient Instructions (Signed)
Plan: Avoid bending, stooping and avoid lifting weights greater than 10 lbs. ?Avoid prolong standing and walking. ?Avoid frequent bending and stooping  ?No lifting greater than 10 lbs. ?May use ice or moist heat for pain. ?Weight loss is of benefit. ?Handicap license is approved. ?Referral to cardiology for edema that is likely due to cardiogenic fluid retention. ?PT for edema control bursitis and lumbar DDD.  ?

## 2021-10-28 NOTE — Progress Notes (Signed)
Office Visit Note   Patient: [name]           Date of Birth: 1940/09/07           MRN: [id number] Visit Date: 10/28/2021              Requested by: Celene Squibb, MD [place],  [place] [address] PCP: Celene Squibb, MD   Assessment & Plan: Visit Diagnoses:  1. Bilateral hip pain   2. Trochanteric bursitis, left hip   3. Trochanteric bursitis, right hip   4. Degenerative disc disease, lumbar   5. Spinal stenosis of lumbar region with neurogenic claudication   6. Edema, pitting     Plan: Avoid bending, stooping and avoid lifting weights greater than 10 lbs. Avoid prolong standing and walking. Avoid frequent bending and stooping  No lifting greater than 10 lbs. May use ice or moist heat for pain. Weight loss is of benefit. Handicap license is approved. Referral to cardiology for edema that is likely due to cardiogenic fluid retention. PT for edema control bursitis and lumbar DDD.   Follow-Up Instructions: No follow-ups on file.   Orders:  Orders Placed This Encounter  Procedures   XR Lumbar Spine 2-3 Views   XR HIPS BILAT W OR W/O PELVIS 3-4 VIEWS   Ambulatory referral to Cardiology   No orders of the defined types were placed in this encounter.     Procedures: No procedures performed   Clinical Data: No additional findings.   Subjective: Chief Complaint  Patient presents with   Lower Back - Pain   Right Hip - Pain   Left Hip - Pain    81 year old female with extensive cardiac concern   Review of Systems   Objective: Vital Signs: BP (!) 151/79   Pulse 75   Ht '5\' 7"'$  (1.702 m)   Wt 235 lb (106.6 kg)   BMI 36.81 kg/m   Physical Exam  Ortho Exam  Specialty Comments:  No specialty comments available.  Imaging: No results found.   PMFS History: Patient Active Problem List   Diagnosis Date Noted   Vitamin D deficiency 07/14/2019   Esophageal dysphagia 12/10/2018   RUQ pain 09/17/2017   Right flank pain 09/17/2017    Biventricular ICD (implantable cardioverter-defibrillator) in place 11/17/2013   Atrial fibrillation ([place]) 55/73/2202   Chronic systolic heart failure ([place]) 10/13/2013   Hyponatremia 03/04/2013   Constipation 09/13/2011   GERD 09/13/2010   Hypothyroidism 04/28/2009   Hyperlipidemia 04/28/2009   Hypertension 04/28/2009   COPD (chronic obstructive pulmonary disease) ([place]) 04/28/2009   LEFT BUNDLE BRANCH BLOCK 04/28/2009   DEGENERATIVE JOINT DISEASE 04/28/2009   Past Medical History:  Diagnosis Date   Allergy    Anemia    Atrial fibrillation ([place])    Cardiomyopathy, nonischemic ([place])    a. EF previously 35% in 2008 with cath showing no significant CAD b. EF improved to 55-60% in 02/2020   Cataract    Cerebrovascular accident [facility])    No clinical event; asymptomatic CT finding   CHF (congestive heart failure) ([place])    Chronic kidney disease    COPD (chronic obstructive pulmonary disease) ([place])    DDD (degenerative disc disease), cervical    Cervical   Degenerative joint disease    Right hip and knee   Essential hypertension    GERD (gastroesophageal reflux disease)    Hip dislocation, right ([place])    Hyperlipemia    Hypothyroid  LBBB (left bundle branch block)    Pacemaker    S/P colonoscopy 2009   Dr. Sharlett Iles: reportedly normal per pt   S/P endoscopy 1980s    Family History  Problem Relation Age of Onset   Brain cancer Mother    Heart disease Mother    Hypertension Mother    Stroke Mother    Atrial fibrillation Other    Stroke Brother    Colon cancer Neg Hx     Past Surgical History:  Procedure Laterality Date   ABDOMINAL HYSTERECTOMY     APPENDECTOMY     BI-VENTRICULAR IMPLANTABLE CARDIOVERTER DEFIBRILLATOR  (CRT-D)  10-31-2013   MDT Hillery Aldo CRTD implanted by Dr Lovena Le   BIOPSY  12/18/2018   Procedure: BIOPSY;  Surgeon: Daneil Dolin, MD;  [facility];  Service: Endoscopy;;  Gastric    BIV PACEMAKER INSERTION CRT-P N/A 12/17/2020    Procedure: DOWNGRAD TO BIV PACEMAKER INSERTION CRT-P;  Surgeon: Evans Lance, MD;  Location: [place] CV LAB;  Service: Cardiovascular;  Laterality: N/A;   BREAST BIOPSY     bilaterally; benign   can not have MRI due to jaw surgery     CATARACT EXTRACTION W/PHACO Left 03/28/2021   Procedure: CATARACT EXTRACTION PHACO AND INTRAOCULAR LENS PLACEMENT ([place]);  Surgeon: Baruch Goldmann, MD;  Location: [facility] ORS;  Service: Ophthalmology;  Laterality: Left;  CDE  17.17   CATARACT EXTRACTION W/PHACO Right 04/11/2021   Procedure: CATARACT EXTRACTION PHACO AND INTRAOCULAR LENS PLACEMENT RIGHT EYE;  Surgeon: Baruch Goldmann, MD;  Location: [facility] ORS;  Service: Ophthalmology;  Laterality: Right;  right CDE=17.03   CHOLECYSTECTOMY     COLONOSCOPY  2009   Dr. Sharlett Iles: Normal   ESOPHAGOGASTRODUODENOSCOPY  07/13/2011   small hh, patchy gastric erythema with scarring deformity of antrum, chronic gastritis, bx benign with no H.Pylori. Procedure: ESOPHAGOGASTRODUODENOSCOPY (EGD);  Surgeon: Daneil Dolin, MD;  [facility];  Service: Endoscopy;  Laterality: N/A;  10:45   ESOPHAGOGASTRODUODENOSCOPY N/A 12/18/2018   Dr. Gala Romney: normal esophagus s/p dilation, small hh, erythema in stomach with benign biopsy.    IMPLANTABLE CARDIOVERTER DEFIBRILLATOR IMPLANT N/A 10/31/2013   Procedure: IMPLANTABLE CARDIOVERTER DEFIBRILLATOR IMPLANT;  Surgeon: Evans Lance, MD;  Location: [facility] CATH LAB;  Service: Cardiovascular;  Laterality: N/A;   MALONEY DILATION N/A 12/18/2018   Procedure: Venia Minks DILATION;  Surgeon: Daneil Dolin, MD;  [facility];  Service: Endoscopy;  Laterality: N/A;   MANDIBLE SURGERY     secondary to malocclusion   THYROIDECTOMY     neoplasm   Ureteral Surgery  1970   Social History   Occupational History   Occupation: retired  Tobacco Use   Smoking status: Never   Smokeless tobacco: Never  Vaping Use   Vaping Use: Never used  Substance and Sexual Activity   Alcohol use: No     Alcohol/week: 0.0 standard drinks   Drug use: No   Sexual activity: Not on file

## 2021-10-31 ENCOUNTER — Ambulatory Visit (HOSPITAL_COMMUNITY): Payer: Medicare HMO | Admitting: Physical Therapy

## 2021-10-31 ENCOUNTER — Encounter (HOSPITAL_COMMUNITY): Payer: Self-pay | Admitting: Physical Therapy

## 2021-10-31 DIAGNOSIS — I890 Lymphedema, not elsewhere classified: Secondary | ICD-10-CM | POA: Diagnosis not present

## 2021-10-31 NOTE — Therapy (Signed)
?OUTPATIENT PHYSICAL THERAPY ONCOLOGY TREATMENT ? ?Patient Name: [name] ?MRN: [id number] ?DOB:[birth year], 81 y.o., female ?Today's Date: 10/31/2021 ? ? PT End of Session - 10/31/21 1418   ? ? Visit Number 3   ? Number of Visits 18   ? Date for PT Re-Evaluation 11/29/21   ? Authorization Type humana requested 12   ? Authorization Time Period 12 visits from 4/4/-->11/18/21   ? Authorization - Visit Number 3   ? Authorization - Number of Visits 12   ? PT Start Time 1320   ? PT Stop Time 1350   ? PT Time Calculation (min) 30 min   ? Activity Tolerance Patient tolerated treatment well   ? Behavior During Therapy [facility] for tasks assessed/performed   ? ?  ?  ? ?  ? ? ? ?Past Medical History:  ?Diagnosis Date  ? Allergy   ? Anemia   ? Atrial fibrillation ([place])   ? Cardiomyopathy, nonischemic ([place])   ? a. EF previously 35% in 2008 with cath showing no significant CAD b. EF improved to 55-60% in 02/2020  ? Cataract   ? Cerebrovascular accident [facility])   ? No clinical event; asymptomatic CT finding  ? CHF (congestive heart failure) ([place])   ? Chronic kidney disease   ? COPD (chronic obstructive pulmonary disease) ([place])   ? DDD (degenerative disc disease), cervical   ? Cervical  ? Degenerative joint disease   ? Right hip and knee  ? Essential hypertension   ? GERD (gastroesophageal reflux disease)   ? Hip dislocation, right ([place])   ? Hyperlipemia   ? Hypothyroid   ? LBBB (left bundle branch block)   ? Pacemaker   ? S/P colonoscopy 2009  ? Dr. Sharlett Iles: reportedly normal per pt  ? S/P endoscopy 1980s  ? ?Past Surgical History:  ?Procedure Laterality Date  ? ABDOMINAL HYSTERECTOMY    ? APPENDECTOMY    ? BI-VENTRICULAR IMPLANTABLE CARDIOVERTER DEFIBRILLATOR  (CRT-D)  10-31-2013  ? MDT Hillery Aldo CRTD implanted by Dr Lovena Le  ? BIOPSY  12/18/2018  ? Procedure: BIOPSY;  Surgeon: Daneil Dolin, MD;  [facility];  Service: Endoscopy;;  Gastric ?  ? BIV PACEMAKER INSERTION CRT-P N/A 12/17/2020  ? Procedure: DOWNGRAD TO BIV  PACEMAKER INSERTION CRT-P;  Surgeon: Evans Lance, MD;  Location: [place] CV LAB;  Service: Cardiovascular;  Laterality: N/A;  ? BREAST BIOPSY    ? bilaterally; benign  ? can not have MRI due to jaw surgery    ? CATARACT EXTRACTION W/PHACO Left 03/28/2021  ? Procedure: CATARACT EXTRACTION PHACO AND INTRAOCULAR LENS PLACEMENT (IOC);  Surgeon: Baruch Goldmann, MD;  Location: [facility] ORS;  Service: Ophthalmology;  Laterality: Left;  CDE  17.17  ? CATARACT EXTRACTION W/PHACO Right 04/11/2021  ? Procedure: CATARACT EXTRACTION PHACO AND INTRAOCULAR LENS PLACEMENT RIGHT EYE;  Surgeon: Baruch Goldmann, MD;  Location: [facility] OR[facility];  Service: Ophthalmology;  Laterality: Right;  right ?CDE=17.03  ? CHOLECYSTECTOMY    ? COLONOSCOPY  2009  ? Dr. Sharlett Iles: Normal  ? ESOPHAGOGASTRODUODENOSCOPY  07/13/2011  ? small hh, patchy gastric erythema with scarring deformity of antrum, chronic gastritis, bx benign with no H.Pylori. Procedure: ESOPHAGOGASTRODUODENOSCOPY (EGD);  Surgeon: Daneil Dolin, MD;  [facility];  Service: Endoscopy;  Laterality: N/A;  10:45  ? ESOPHAGOGASTRODUODENOSCOPY N/A 12/18/2018  ? Dr. Gala Romney: normal esophagus s/p dilation, small hh, erythema in stomach with benign biopsy.   ? IMPLANTABLE CARDIOVERTER DEFIBRILLATOR IMPLANT N/A 10/31/2013  ?  Procedure: IMPLANTABLE CARDIOVERTER DEFIBRILLATOR IMPLANT;  Surgeon: Evans Lance, MD;  Location: [facility] CATH LAB;  Service: Cardiovascular;  Laterality: N/A;  ? MALONEY DILATION N/A 12/18/2018  ? Procedure: MALONEY DILATION;  Surgeon: Daneil Dolin, MD;  [facility];  Service: Endoscopy;  Laterality: N/A;  ? MANDIBLE SURGERY    ? secondary to malocclusion  ? 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 Ureteral Surgery  1970  ? ?Patient Active Problem List  ? Diagnosis Date Noted  ? Vitamin D deficiency 07/14/2019  ? Esophageal dysphagia 12/10/2018  ? RUQ pain 09/17/2017  ? Right flank pain 09/17/2017  ? Biventricular ICD (implantable cardioverter-defibrillator) in place  11/17/2013  ? Atrial fibrillation ([place]) 11/17/2013  ? Chronic systolic heart failure ([place]) 10/13/2013  ? Hyponatremia 03/04/2013  ? Constipation 09/13/2011  ? GERD 09/13/2010  ? Hypothyroidism 04/28/2009  ? Hyperlipidemia 04/28/2009  ? Hypertension 04/28/2009  ? COPD (chronic obstructive pulmonary disease) ([place]) 04/28/2009  ? LEFT BUNDLE BRANCH BLOCK 04/28/2009  ? DEGENERATIVE JOINT DISEASE 04/28/2009  ? ? ?PCP: Celene Squibb, MD ? ?REFERRING PROVIDER: Erma Heritage, PA-C ? ?REFERRING DIAG: lymphedema  ? ?THERAPY DIAG:  ?No diagnosis found. ? ?ONSET DATE: chronic ? ?SUBJECTIVE                                                                                                                                                                                          ? ?SUBJECTIVE STATEMENT:  Pt stated she was not here last week because of her increased back and bil hip pain.  States the MD wrote her a new order for back therapy in addition to her lymphedema.  States she wishes she wouldve gotten her back better before beginning the therapy on her legs.  Request to wrap today only as she cannot tolerate sitting too long and cannot tolerate supine at all. ? ?PERTINENT HISTORY: [name] is a 81 y.o. female with past medical history of HFimpEF (EF previously 35% in 2008 with cath showing no significant CAD, at 55-60% in 02/2020), LBBB, s/p Medtronic biventricular ICD placement, paroxysmal atrial fibrillation, HTN, GERD and DJD  ? ?PAIN:  ?Are you having pain? Yes needs surgery but is not a candidate.  ?NPRS scale: 10/10 ?Pain location: both but Rt hip is worse and Lower back ?Pain orientation: Bilateral  ?PAIN TYPE: aching ?Pain description: constant  ?Aggravating factors: Wt bearing  ?Relieving factors: shots  ?This is not what we are seeing pt for therefore we will not address this in our treatment plan.   ? ?PRECAUTIONS: Other: cellulitis  ? ?WEIGHT BEARING RESTRICTIONS No ? ?FALLS:  ?  Has patient fallen in last 6  months? No ? ?LIVING ENVIRONMENT: ?Lives with: lives with their family and lives with their son ?Lives in: House/apartment ?Stairs: No ?Has following equipment at home: Gilford Rile - 2 wheeled ? ?OCCUPATION: retired  ? ?LEISURE: would love to get back planting flowers  ? ?  ?PRIOR LEVEL OF FUNCTION: Independent with basic ADLs ? ?PATIENT GOALS less edema, to move better  ? ? ?OBJECTIVE ? ?COGNITION: ? Overall cognitive status: Within functional limits for tasks assessed  ? ?PALPATION: Increased induration posterior aspect of B LE  and thighs  ? ?OBSERVATIONS / OTHER ASSESSMENTS: weeping noted B, induration,  ?Paplliomas B  ? ? ?LYMPHEDEMA ASSESSMENTS:  ? ?INFECTIONS: no ? ?LYMPHEDEMA ASSESSMENTS:  ? ?LE LANDMARK RIGHT ?10/18/2021  ?At groin   ?30 cm proximal to suprapatella   ?20 cm proximal to suprapatella   70  ?10 cm proximal to suprapatella   63 ?  ?At midpatella / popliteal crease   53.5  ?30 cm proximal to floor at lateral plantar foot   45.6  ?20 cm proximal to floor at lateral plantar foot   39.3  ?10 cm proximal to floor at lateral plantar foot   35  ?Circumference of ankle/heel   38  ?5 cm proximal to 1st MTP joint   26  ?Across MTP joint   24.3  ?Around proximal great toe   ?(Blank rows = not tested) ? ?LE LANDMARK LEFT ?10/18/2021  ?At groin   ?30 cm proximal to suprapatella   ?20 cm proximal to suprapatella   66.2  ?10 cm proximal to suprapatella   61.2  ?At [place] / popliteal crease   52.1  ?30 cm proximal to floor at lateral plantar foot   47  ?20 cm proximal to floor at lateral plantar foot   41.5  ?10 cm proximal to floor at lateral plantar foot   37.3  ?Circumference of ankle/heel   39  ?5 cm proximal to 1st MTP joint   26.7  ?Across MTP joint  25  ?Around proximal great toe   ?(Blank rows = not tested) ? ? ? ? ? ? ? ?TODAY'S TREATMENT  ?10/31/21 ?Bandaging only: Lotion applied to LE's and bandaged using profore system bilaterally to distal LE's.  No foam used as pt refused ? ? 10/21/21 0001  ?Manual  Therapy  ?Manual Therapy Compression Bandaging  ?Manual therapy comments Manual complete separate than rest of tx  ?Compression Bandaging Cut 1/2in foam and added to profore BLE  ? ? ? ?PATIENT EDUCATION:  ?[place]

## 2021-11-02 ENCOUNTER — Encounter (HOSPITAL_COMMUNITY): Payer: Self-pay

## 2021-11-02 ENCOUNTER — Ambulatory Visit (HOSPITAL_COMMUNITY): Payer: Medicare HMO

## 2021-11-02 DIAGNOSIS — I890 Lymphedema, not elsewhere classified: Secondary | ICD-10-CM

## 2021-11-02 NOTE — Therapy (Signed)
?OUTPATIENT PHYSICAL THERAPY ONCOLOGY TREATMENT ? ?Patient Name: [name] ?MRN: [id number] ?DOB:[birth year], 81 y.o., female ?Today's Date: 11/02/2021 ? ? PT End of Session - 11/02/21 1259   ? ? Visit Number 4   ? Number of Visits 18   ? Date for PT Re-Evaluation 11/29/21   ? Authorization Type humana requested 12   ? Authorization Time Period 12 visits from 4/4/-->11/18/21   ? Authorization - Visit Number 4   ? Authorization - Number of Visits 12   ? PT Start Time 1050   ? PT Stop Time 1205   ? PT Time Calculation (min) 75 min   ? Activity Tolerance Patient tolerated treatment well   ? Behavior During Therapy [facility] for tasks assessed/performed   ? ?  ?  ? ?  ? ? ? ?Past Medical History:  ?Diagnosis Date  ? Allergy   ? Anemia   ? Atrial fibrillation ([place])   ? Cardiomyopathy, nonischemic ([place])   ? a. EF previously 35% in 2008 with cath showing no significant CAD b. EF improved to 55-60% in 02/2020  ? Cataract   ? Cerebrovascular accident [facility])   ? No clinical event; asymptomatic CT finding  ? CHF (congestive heart failure) ([place])   ? Chronic kidney disease   ? COPD (chronic obstructive pulmonary disease) ([place])   ? DDD (degenerative disc disease), cervical   ? Cervical  ? Degenerative joint disease   ? Right hip and knee  ? Essential hypertension   ? GERD (gastroesophageal reflux disease)   ? Hip dislocation, right ([place])   ? Hyperlipemia   ? Hypothyroid   ? LBBB (left bundle branch block)   ? Pacemaker   ? S/P colonoscopy 2009  ? Dr. Sharlett Iles: reportedly normal per pt  ? S/P endoscopy 1980s  ? ?Past Surgical History:  ?Procedure Laterality Date  ? ABDOMINAL HYSTERECTOMY    ? APPENDECTOMY    ? BI-VENTRICULAR IMPLANTABLE CARDIOVERTER DEFIBRILLATOR  (CRT-D)  10-31-2013  ? MDT Hillery Aldo CRTD implanted by Dr Lovena Le  ? BIOPSY  12/18/2018  ? Procedure: BIOPSY;  Surgeon: Daneil Dolin, MD;  [facility];  Service: Endoscopy;;  Gastric ?  ? BIV PACEMAKER INSERTION CRT-P N/A 12/17/2020  ? Procedure: DOWNGRAD TO BIV  PACEMAKER INSERTION CRT-P;  Surgeon: Evans Lance, MD;  Location: [place] CV LAB;  Service: Cardiovascular;  Laterality: N/A;  ? BREAST BIOPSY    ? bilaterally; benign  ? can not have MRI due to jaw surgery    ? CATARACT EXTRACTION W/PHACO Left 03/28/2021  ? Procedure: CATARACT EXTRACTION PHACO AND INTRAOCULAR LENS PLACEMENT (I[facility]);  Surgeon: Baruch Goldmann, MD;  Location: [facility] ORS;  Service: Ophthalmology;  Laterality: Left;  CDE  17.17  ? CATARACT EXTRACTION W/PHACO Right 04/11/2021  ? Procedure: CATARACT EXTRACTION PHACO AND INTRAOCULAR LENS PLACEMENT RIGHT EYE;  Surgeon: Baruch Goldmann, MD;  Location: [facility] OR[facility];  Service: Ophthalmology;  Laterality: Right;  right ?CDE=17.03  ? CHOLECYSTECTOMY    ? COLONOSCOPY  2009  ? Dr. Sharlett Iles: Normal  ? ESOPHAGOGASTRODUODENOSCOPY  07/13/2011  ? small hh, patchy gastric erythema with scarring deformity of antrum, chronic gastritis, bx benign with no H.Pylori. Procedure: ESOPHAGOGASTRODUODENOSCOPY (EGD);  Surgeon: Daneil Dolin, MD;  [facility];  Service: Endoscopy;  Laterality: N/A;  10:45  ? ESOPHAGOGASTRODUODENOSCOPY N/A 12/18/2018  ? Dr. Gala Romney: normal esophagus s/p dilation, small hh, erythema in stomach with benign biopsy.   ? IMPLANTABLE CARDIOVERTER DEFIBRILLATOR IMPLANT N/A 10/31/2013  ?  Procedure: IMPLANTABLE CARDIOVERTER DEFIBRILLATOR IMPLANT;  Surgeon: Evans Lance, MD;  Location: [facility] CATH LAB;  Service: Cardiovascular;  Laterality: N/A;  ? MALONEY DILATION N/A 12/18/2018  ? Procedure: MALONEY DILATION;  Surgeon: Daneil Dolin, MD;  [facility];  Service: Endoscopy;  Laterality: N/A;  ? MANDIBLE SURGERY    ? secondary to malocclusion  ? 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 Ureteral Surgery  1970  ? ?Patient Active Problem List  ? Diagnosis Date Noted  ? Vitamin D deficiency 07/14/2019  ? Esophageal dysphagia 12/10/2018  ? RUQ pain 09/17/2017  ? Right flank pain 09/17/2017  ? Biventricular ICD (implantable cardioverter-defibrillator) in place  11/17/2013  ? Atrial fibrillation ([place]) 11/17/2013  ? Chronic systolic heart failure ([place]) 10/13/2013  ? Hyponatremia 03/04/2013  ? Constipation 09/13/2011  ? GERD 09/13/2010  ? Hypothyroidism 04/28/2009  ? Hyperlipidemia 04/28/2009  ? Hypertension 04/28/2009  ? COPD (chronic obstructive pulmonary disease) ([place]) 04/28/2009  ? LEFT BUNDLE BRANCH BLOCK 04/28/2009  ? DEGENERATIVE JOINT DISEASE 04/28/2009  ? ? ?PCP: Celene Squibb, MD ? ?REFERRING PROVIDER: Erma Heritage, PA-C ? ?REFERRING DIAG: lymphedema  ? ?THERAPY DIAG:  ?Lymphedema, not elsewhere classified ? ?ONSET DATE: chronic ? ?SUBJECTIVE                                                                                                                                                                                          ? ?SUBJECTIVE STATEMENT:  Pt stated she received 2 shots which helped the hips, continues to have high LBP with pain scale 10/10 until standing upright and pain at 5/10.  Reports she has done some of the exercises, not regularly. ? ?PERTINENT HISTORY: [name] is a 81 y.o. female with past medical history of HFimpEF (EF previously 35% in 2008 with cath showing no significant CAD, at 55-60% in 02/2020), LBBB, s/p Medtronic biventricular ICD placement, paroxysmal atrial fibrillation, HTN, GERD and DJD  ? ?PAIN:  ?Are you having pain? Yes needs surgery but is not a candidate.  ?NPRS scale: 10/10 ?Pain location: Lower back ?Pain orientation: Bilateral  ?PAIN TYPE: aching ?Pain description: constant  ?Aggravating factors: Wt bearing  ?Relieving factors: shots  ?This is not what we are seeing pt for therefore we will not address this in our treatment plan.   ? ?PRECAUTIONS: Other: cellulitis  ? ?WEIGHT BEARING RESTRICTIONS No ? ?FALLS:  ?Has patient fallen in last 6 months? No ? ?LIVING ENVIRONMENT: ?Lives with: lives with their family and lives with their son ?Lives in: House/apartment ?Stairs: No ?Has following equipment at home: Gilford Rile -  2 wheeled ? ?OCCUPATION:  retired  ? ?LEISURE: would love to get back planting flowers  ? ?  ?PRIOR LEVEL OF FUNCTION: Independent with basic ADLs ? ?PATIENT GOALS less edema, to move better  ? ? ?OBJECTIVE ? ?COGNITION: ? Overall cognitive status: Within functional limits for tasks assessed  ? ?PALPATION: Increased induration posterior aspect of B LE  and thighs  ? ?OBSERVATIONS / OTHER ASSESSMENTS: weeping noted B, induration,  ?Paplliomas B  ? ? ?LYMPHEDEMA ASSESSMENTS:  ? ?INFECTIONS: no ? ?LYMPHEDEMA ASSESSMENTS:  ? ?LE LANDMARK RIGHT ?10/18/2021 Right  ?11/02/21  ?At groin    ?30 cm proximal to suprapatella    ?20 cm proximal to suprapatella   70 66  ?10 cm proximal to suprapatella   63 ? 59.6  ?At midpatella / popliteal crease   53.5 51.3  ?30 cm proximal to floor at lateral plantar foot   45.6 42  ?20 cm proximal to floor at lateral plantar foot   39.3 32.8  ?10 cm proximal to floor at lateral plantar foot   35 28.5  ?Circumference of ankle/heel   38 33.5  ?5 cm proximal to 1st MTP joint   26 23.4  ?Across MTP joint   24.3 23.6  ?Around proximal great toe  9.3  ?(Blank rows = not tested) ? ?LE LANDMARK LEFT ?10/18/2021 Left  ?11/02/21  ?At groin    ?30 cm proximal to suprapatella    ?20 cm proximal to suprapatella   66.2 63.8  ?10 cm proximal to suprapatella   61.2 59  ?At midpatella / popliteal crease   52.1 47.4  ?30 cm proximal to floor at lateral plantar foot   47 41.2  ?20 cm proximal to floor at lateral plantar foot   41.5 33.4  ?10 cm proximal to floor at lateral plantar foot   37.3 29  ?Circumference of ankle/heel   39 33.6  ?5 cm proximal to 1st MTP joint   26.7 23.4  ?Across MTP joint  25 24.6  ?Around proximal great toe  8.4  ?(Blank rows = not tested) ? ? ? ? ? ? ? ?TODAY'S TREATMENT  ?11/02/21: ? ? 11/02/21 0001  ?Manual Therapy  ?Manual Therapy Manual Lymphatic Drainage (MLD);Compression Bandaging;Other (comment)  ?Manual therapy comments Manual complete separate than rest of tx  ?Manual Lymphatic  Drainage (MLD) Semirecumbent position supine wiht elevated head and LE on wedges for comfort:  short neck, superficial then deep abdominal, inginueal to axillary anastomosis BLE  ?Compression Bandaging Profore with

## 2021-11-04 ENCOUNTER — Encounter (HOSPITAL_COMMUNITY): Payer: Self-pay

## 2021-11-04 ENCOUNTER — Ambulatory Visit (HOSPITAL_COMMUNITY): Payer: Medicare HMO

## 2021-11-04 DIAGNOSIS — I890 Lymphedema, not elsewhere classified: Secondary | ICD-10-CM

## 2021-11-04 NOTE — Therapy (Signed)
?OUTPATIENT PHYSICAL THERAPY ONCOLOGY TREATMENT ? ?Patient Name: [name] ?MRN: [id number] ?DOB:01/10/1941, 81 y.o., female ?Today's Date: 11/04/2021 ? ? PT End of Session - 11/04/21 1205   ? ? Visit Number 5   ? Number of Visits 18   ? Date for PT Re-Evaluation 11/29/21   ? Authorization Type humana requested 12   ? Authorization Time Period 12 visits from 4/4/-->11/18/21   ? Authorization - Visit Number 5   ? Authorization - Number of Visits 12   ? PT Start Time 1052   ? PT Stop Time 1200   ? PT Time Calculation (min) 68 min   ? Activity Tolerance Patient tolerated treatment well   ? Behavior During Therapy [facility] for tasks assessed/performed   ? ?  ?  ? ?  ? ? ? ?Past Medical History:  ?Diagnosis Date  ? Allergy   ? Anemia   ? Atrial fibrillation ([place])   ? Cardiomyopathy, nonischemic ([place])   ? a. EF previously 35% in 2008 with cath showing no significant CAD b. EF improved to 55-60% in 02/2020  ? Cataract   ? Cerebrovascular accident [facility])   ? No clinical event; asymptomatic CT finding  ? CHF (congestive heart failure) ([place])   ? Chronic kidney disease   ? COPD (chronic obstructive pulmonary disease) ([place])   ? DDD (degenerative disc disease), cervical   ? Cervical  ? Degenerative joint disease   ? Right hip and knee  ? Essential hypertension   ? GERD (gastroesophageal reflux disease)   ? Hip dislocation, right ([place])   ? Hyperlipemia   ? Hypothyroid   ? LBBB (left bundle branch block)   ? Pacemaker   ? S/P colonoscopy 2009  ? Dr. Sharlett Iles: reportedly normal per pt  ? S/P endoscopy 1980s  ? ?Past Surgical History:  ?Procedure Laterality Date  ? ABDOMINAL HYSTERECTOMY    ? APPENDECTOMY    ? BI-VENTRICULAR IMPLANTABLE CARDIOVERTER DEFIBRILLATOR  (CRT-D)  10-31-2013  ? MDT Hillery Aldo CRTD implanted by Dr Lovena Le  ? BIOPSY  12/18/2018  ? Procedure: BIOPSY;  Surgeon: Daneil Dolin, MD;  [facility];  Service: Endoscopy;;  Gastric ?  ? BIV PACEMAKER INSERTION CRT-P N/A 12/17/2020  ? Procedure: DOWNGRAD TO BIV  PACEMAKER INSERTION CRT-P;  Surgeon: Evans Lance, MD;  Location: [place] CV LAB;  Service: Cardiovascular;  Laterality: N/A;  ? BREAST BIOPSY    ? bilaterally; benign  ? can not have MRI due to jaw surgery    ? CATARACT EXTRACTION W/PHACO Left 03/28/2021  ? Procedure: CATARACT EXTRACTION PHACO AND INTRAOCULAR LENS PLACEMENT (IOC);  Surgeon: Baruch Goldmann, MD;  Location: [facility] ORS;  Service: Ophthalmology;  Laterality: Left;  CDE  17.17  ? CATARACT EXTRACTION W/PHACO Right 04/11/2021  ? Procedure: CATARACT EXTRACTION PHACO AND INTRAOCULAR LENS PLACEMENT RIGHT EYE;  Surgeon: Baruch Goldmann, MD;  Location: [facility] ORS;  Service: Ophthalmology;  Laterality: Right;  right ?CDE=17.03  ? CHOLECYSTECTOMY    ? COLONOSCOPY  2009  ? Dr. Sharlett Iles: Normal  ? ESOPHAGOGASTRODUODENOSCOPY  07/13/2011  ? small hh, patchy gastric erythema with scarring deformity of antrum, chronic gastritis, bx benign with no H.Pylori. Procedure: ESOPHAGOGASTRODUODENOSCOPY (EGD);  Surgeon: Daneil Dolin, MD;  [facility];  Service: Endoscopy;  Laterality: N/A;  10:45  ? ESOPHAGOGASTRODUODENOSCOPY N/A 12/18/2018  ? Dr. Gala Romney: normal esophagus s/p dilation, small hh, erythema in stomach with benign biopsy.   ? IMPLANTABLE CARDIOVERTER DEFIBRILLATOR IMPLANT N/A 10/31/2013  ?  Procedure: IMPLANTABLE CARDIOVERTER DEFIBRILLATOR IMPLANT;  Surgeon: Evans Lance, MD;  Location: [facility] CATH LAB;  Service: Cardiovascular;  Laterality: N/A;  ? MALONEY DILATION N/A 12/18/2018  ? Procedure: MALONEY DILATION;  Surgeon: Daneil Dolin, MD;  [facility];  Service: Endoscopy;  Laterality: N/A;  ? MANDIBLE SURGERY    ? secondary to malocclusion  ? 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 Ureteral Surgery  1970  ? ?Patient Active Problem List  ? Diagnosis Date Noted  ? Vitamin D deficiency 07/14/2019  ? Esophageal dysphagia 12/10/2018  ? RUQ pain 09/17/2017  ? Right flank pain 09/17/2017  ? Biventricular ICD (implantable cardioverter-defibrillator) in place  11/17/2013  ? Atrial fibrillation ([place]) 11/17/2013  ? Chronic systolic heart failure ([place]) 10/13/2013  ? Hyponatremia 03/04/2013  ? Constipation 09/13/2011  ? GERD 09/13/2010  ? Hypothyroidism 04/28/2009  ? Hyperlipidemia 04/28/2009  ? Hypertension 04/28/2009  ? COPD (chronic obstructive pulmonary disease) ([place]) 04/28/2009  ? LEFT BUNDLE BRANCH BLOCK 04/28/2009  ? DEGENERATIVE JOINT DISEASE 04/28/2009  ? ? ?PCP: Celene Squibb, MD ? ?REFERRING PROVIDER: Erma Heritage, PA-C ? ?REFERRING DIAG: lymphedema  ? ?THERAPY DIAG:  ?Lymphedema, not elsewhere classified ? ?ONSET DATE: chronic ? ?SUBJECTIVE                                                                                                                                                                                          ? ?SUBJECTIVE STATEMENT:  Pt stated she had to remove outer layer and foam yesterday, stated too hot burning and itching.  Pain scale 10/10 LBP, reports the breathing exercise increased her back pain with arm movements yesterday.   ? ?PERTINENT HISTORY: [name] is a 81 y.o. female with past medical history of HFimpEF (EF previously 35% in 2008 with cath showing no significant CAD, at 55-60% in 02/2020), LBBB, s/p Medtronic biventricular ICD placement, paroxysmal atrial fibrillation, HTN, GERD and DJD  ? ?PAIN:  ?Are you having pain? Yes needs surgery but is not a candidate.  ?NPRS scale: 10/10 ?Pain location: Lower back ?Pain orientation: Bilateral  ?PAIN TYPE: aching ?Pain description: constant  ?Aggravating factors: Wt bearing  ?Relieving factors: shots  ?This is not what we are seeing pt for therefore we will not address this in our treatment plan.   ? ?PRECAUTIONS: Other: cellulitis  ? ?WEIGHT BEARING RESTRICTIONS No ? ?FALLS:  ?Has patient fallen in last 6 months? No ? ?LIVING ENVIRONMENT: ?Lives with: lives with their family and lives with their son ?Lives in: House/apartment ?Stairs: No ?Has following equipment at home: Gilford Rile  - 2 wheeled ? ?OCCUPATION: retired  ? ?  LEISURE: would love to get back planting flowers  ? ?  ?PRIOR LEVEL OF FUNCTION: Independent with basic ADLs ? ?PATIENT GOALS less edema, to move better  ? ? ?OBJECTIVE ? ?COGNITION: ? Overall cognitive status: Within functional limits for tasks assessed  ? ?PALPATION: Increased induration posterior aspect of B LE  and thighs  ? ?OBSERVATIONS / OTHER ASSESSMENTS: weeping noted B, induration,  ?Paplliomas B  ? ? ?LYMPHEDEMA ASSESSMENTS:  ? ?INFECTIONS: no ? ?LYMPHEDEMA ASSESSMENTS:  ? ?LE LANDMARK RIGHT ?10/18/2021 Right  ?11/02/21  ?At groin    ?30 cm proximal to suprapatella    ?20 cm proximal to suprapatella   70 66  ?10 cm proximal to suprapatella   63 ? 59.6  ?At midpatella / popliteal crease   53.5 51.3  ?30 cm proximal to floor at lateral plantar foot   45.6 42  ?20 cm proximal to floor at lateral plantar foot   39.3 32.8  ?10 cm proximal to floor at lateral plantar foot   35 28.5  ?Circumference of ankle/heel   38 33.5  ?5 cm proximal to 1st MTP joint   26 23.4  ?Across MTP joint   24.3 23.6  ?Around proximal great toe  9.3  ?(Blank rows = not tested) ? ?LE LANDMARK LEFT ?10/18/2021 Left  ?11/02/21  ?At groin    ?30 cm proximal to suprapatella    ?20 cm proximal to suprapatella   66.2 63.8  ?10 cm proximal to suprapatella   61.2 59  ?At midpatella / popliteal crease   52.1 47.4  ?30 cm proximal to floor at lateral plantar foot   47 41.2  ?20 cm proximal to floor at lateral plantar foot   41.5 33.4  ?10 cm proximal to floor at lateral plantar foot   37.3 29  ?Circumference of ankle/heel   39 33.6  ?5 cm proximal to 1st MTP joint   26.7 23.4  ?Across MTP joint  25 24.6  ?Around proximal great toe  8.4  ?(Blank rows = not tested) ? ? ? ? ? ? ? ?TODAY'S TREATMENT  ?11/04/21: ? ? 11/04/21 0001  ?Manual Therapy  ?Manual Therapy Manual Lymphatic Drainage (MLD);Compression Bandaging;Other (comment)  ?Manual therapy comments Manual complete separate than rest of tx  ?Manual Lymphatic  Drainage (MLD) Semirecumbent position supine wiht elevated head and LE on wedges for comfort:  short neck, superficial then deep abdominal, inginueal to axillary anastomosis BLE  ?Compression Bandaging Profore wi

## 2021-11-07 ENCOUNTER — Encounter (HOSPITAL_COMMUNITY): Payer: Medicare HMO | Admitting: Physical Therapy

## 2021-11-07 ENCOUNTER — Ambulatory Visit (HOSPITAL_COMMUNITY): Payer: Medicare HMO | Admitting: Physical Therapy

## 2021-11-07 DIAGNOSIS — I890 Lymphedema, not elsewhere classified: Secondary | ICD-10-CM

## 2021-11-07 NOTE — Therapy (Addendum)
?OUTPATIENT PHYSICAL THERAPY ONCOLOGY TREATMENT ? ?Patient Name: [name] ?MRN: [id number] ?DOB:05-09-1941, 81 y.o., female ?Today's Date: 11/07/2021 ? ? PT End of Session - 11/07/21 1310   ? ? Visit Number 6   ? Number of Visits 18   ? Date for PT Re-Evaluation 11/29/21   ? Authorization Type humana requested 12   ? Authorization Time Period 12 visits from 4/4/-->11/18/21   ? Authorization - Visit Number 6   ? Authorization - Number of Visits 12   ? PT Start Time 1315   ? PT Stop Time 1430   ? PT Time Calculation (min) 75 min   ? ?  ?  ? ?  ? ? ? ?Past Medical History:  ?Diagnosis Date  ? Allergy   ? Anemia   ? Atrial fibrillation ([place])   ? Cardiomyopathy, nonischemic ([place])   ? a. EF previously 35% in 2008 with cath showing no significant CAD b. EF improved to 55-60% in 02/2020  ? Cataract   ? Cerebrovascular accident [facility])   ? No clinical event; asymptomatic CT finding  ? CHF (congestive heart failure) ([place])   ? Chronic kidney disease   ? COPD (chronic obstructive pulmonary disease) ([place])   ? DDD (degenerative disc disease), cervical   ? Cervical  ? Degenerative joint disease   ? Right hip and knee  ? Essential hypertension   ? GERD (gastroesophageal reflux disease)   ? Hip dislocation, right ([place])   ? Hyperlipemia   ? Hypothyroid   ? LBBB (left bundle branch block)   ? Pacemaker   ? S/P colonoscopy 2009  ? Dr. Sharlett Iles: reportedly normal per pt  ? S/P endoscopy 1980s  ? ?Past Surgical History:  ?Procedure Laterality Date  ? ABDOMINAL HYSTERECTOMY    ? APPENDECTOMY    ? BI-VENTRICULAR IMPLANTABLE CARDIOVERTER DEFIBRILLATOR  (CRT-D)  10-31-2013  ? MDT Hillery Aldo CRTD implanted by Dr Lovena Le  ? BIOPSY  12/18/2018  ? Procedure: BIOPSY;  Surgeon: Daneil Dolin, MD;  [facility];  Service: Endoscopy;;  Gastric ?  ? BIV PACEMAKER INSERTION CRT-P N/A 12/17/2020  ? Procedure: DOWNGRAD TO BIV PACEMAKER INSERTION CRT-P;  Surgeon: Evans Lance, MD;  Location: [place] CV LAB;  Service: Cardiovascular;   Laterality: N/A;  ? BREAST BIOPSY    ? bilaterally; benign  ? can not have MRI due to jaw surgery    ? CATARACT EXTRACTION W/PHACO Left 03/28/2021  ? Procedure: CATARACT EXTRACTION PHACO AND INTRAOCULAR LENS PLACEMENT (IOC);  Surgeon: Baruch Goldmann, MD;  Location: [facility] ORS;  Service: Ophthalmology;  Laterality: Left;  CDE  17.17  ? CATARACT EXTRACTION W/PHACO Right 04/11/2021  ? Procedure: CATARACT EXTRACTION PHACO AND INTRAOCULAR LENS PLACEMENT RIGHT EYE;  Surgeon: Baruch Goldmann, MD;  Location: [facility] OR[facility];  Service: Ophthalmology;  Laterality: Right;  right ?CDE=17.03  ? CHOLECYSTECTOMY    ? COLONOSCOPY  2009  ? Dr. Sharlett Iles: Normal  ? ESOPHAGOGASTRODUODENOSCOPY  07/13/2011  ? small hh, patchy gastric erythema with scarring deformity of antrum, chronic gastritis, bx benign with no H.Pylori. Procedure: ESOPHAGOGASTRODUODENOSCOPY (EGD);  Surgeon: Daneil Dolin, MD;  [facility];  Service: Endoscopy;  Laterality: N/A;  10:45  ? ESOPHAGOGASTRODUODENOSCOPY N/A 12/18/2018  ? Dr. Gala Romney: normal esophagus s/p dilation, small hh, erythema in stomach with benign biopsy.   ? IMPLANTABLE CARDIOVERTER DEFIBRILLATOR IMPLANT N/A 10/31/2013  ? Procedure: IMPLANTABLE CARDIOVERTER DEFIBRILLATOR IMPLANT;  Surgeon: Evans Lance, MD;  Location: [facility] CATH LAB;  Service: Cardiovascular;  Laterality: N/A;  ? MALONEY DILATION N/A 12/18/2018  ? Procedure: MALONEY DILATION;  Surgeon: Daneil Dolin, MD;  [facility];  Service: Endoscopy;  Laterality: N/A;  ? MANDIBLE SURGERY    ? secondary to malocclusion  ? 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 Ureteral Surgery  1970  ? ?Patient Active Problem List  ? Diagnosis Date Noted  ? Vitamin D deficiency 07/14/2019  ? Esophageal dysphagia 12/10/2018  ? RUQ pain 09/17/2017  ? Right flank pain 09/17/2017  ? Biventricular ICD (implantable cardioverter-defibrillator) in place 11/17/2013  ? Atrial fibrillation ([place]) 11/17/2013  ? Chronic systolic heart failure ([place]) 10/13/2013  ? Hyponatremia  03/04/2013  ? Constipation 09/13/2011  ? GERD 09/13/2010  ? Hypothyroidism 04/28/2009  ? Hyperlipidemia 04/28/2009  ? Hypertension 04/28/2009  ? COPD (chronic obstructive pulmonary disease) ([place]) 04/28/2009  ? LEFT BUNDLE BRANCH BLOCK 04/28/2009  ? DEGENERATIVE JOINT DISEASE 04/28/2009  ? ? ?PCP: Celene Squibb, MD ? ?REFERRING PROVIDER: Erma Heritage, PA-C ? ?REFERRING DIAG: lymphedema  ? ?THERAPY DIAG:  ?Lymphedema, not elsewhere classified ? ?ONSET DATE: chronic ? ?SUBJECTIVE                                                                                                                                                                                          ? ?SUBJECTIVE STATEMENT:  PT states that the back of her left leg was so sore that she had to take the bandages off.  She does not want to be bandaged today.  States that her back pain is a 10/10 as well; the back of her knee is a 5/10  ?PERTINENT HISTORY: [name] is a 81 y.o. female with past medical history of HFimpEF (EF previously 35% in 2008 with cath showing no significant CAD, at 55-60% in 02/2020), LBBB, s/p Medtronic biventricular ICD placement, paroxysmal atrial fibrillation, HTN, GERD and DJD  ? ?PAIN:  ?Are you having pain? Yes needs surgery but is not a candidate.  ?NPRS scale: 10/10 ?Pain location: Lower back ?Pain orientation: Bilateral  ?PAIN TYPE: aching ?Pain description: constant  ?Aggravating factors: Wt bearing  ?Relieving factors: shots  ?This is not what we are seeing pt for therefore we will not address this in our treatment plan.   ? ?PRECAUTIONS: Other: cellulitis  ? ?WEIGHT BEARING RESTRICTIONS No ? ?FALLS:  ?Has patient fallen in last 6 months? No ? ?LIVING ENVIRONMENT: ?Lives with: lives with their family and lives with their son ?Lives in: House/apartment ?Stairs: No ?Has following equipment at home: Gilford Rile - 2 wheeled ? ?OCCUPATION: retired  ? ?LEISURE: would love to get back planting  flowers  ? ?  ?PRIOR LEVEL OF  FUNCTION: Independent with basic ADLs ? ?PATIENT GOALS less edema, to move better  ? ? ?OBJECTIVE ? ?COGNITION: ? Overall cognitive status: Within functional limits for tasks assessed  ? ?PALPATION: Increased induration posterior aspect of B LE  and thighs  ? ?OBSERVATIONS / OTHER ASSESSMENTS: weeping noted B, induration,  ?Paplliomas B  ? ? ?LYMPHEDEMA ASSESSMENTS:  ? ?INFECTIONS: no ? ?LYMPHEDEMA ASSESSMENTS:  ? ?LE LANDMARK RIGHT ?10/18/2021 Right  ?11/02/21  ?At groin    ?30 cm proximal to suprapatella    ?20 cm proximal to suprapatella   70 66  ?10 cm proximal to suprapatella   63 ? 59.6  ?At midpatella / popliteal crease   53.5 51.3  ?30 cm proximal to floor at lateral plantar foot   45.6 42  ?20 cm proximal to floor at lateral plantar foot   39.3 32.8  ?10 cm proximal to floor at lateral plantar foot   35 28.5  ?Circumference of ankle/heel   38 33.5  ?5 cm proximal to 1st MTP joint   26 23.4  ?Across MTP joint   24.3 23.6  ?Around proximal great toe  9.3  ?(Blank rows = not tested) ? ?LE LANDMARK LEFT ?10/18/2021 Left  ?11/02/21  ?At groin    ?30 cm proximal to suprapatella    ?20 cm proximal to suprapatella   66.2 63.8  ?10 cm proximal to suprapatella   61.2 59  ?At midpatella / popliteal crease   52.1 47.4  ?30 cm proximal to floor at lateral plantar foot   47 41.2  ?20 cm proximal to floor at lateral plantar foot   41.5 33.4  ?10 cm proximal to floor at lateral plantar foot   37.3 29  ?Circumference of ankle/heel   39 33.6  ?5 cm proximal to 1st MTP joint   26.7 23.4  ?Across MTP joint  25 24.6  ?Around proximal great toe  8.4  ?(Blank rows = not tested) ? ? ? ? ? ?TODAY'S TREATMENT  ?11/07/2021 ? ? ? 11/07/21   ?Manual Therapy  ?Manual Therapy Manual Lymphatic Drainage (MLD);Compression Bandaging;Other (comment)  ?Manual therapy comments Manual complete separate than rest of tx  ?Manual Lymphatic Drainage (MLD) Sitting  position with legs elevated  on black stool; HMP to low back for comfort:  short neck,  superficial then deep abdominal, inginueal to axillary anastomosis B LE.    ?Compression Bandaging none ?  ?11/04/21: ? ? 11/04/21 0001  ?Manual Therapy  ?Manual Therapy Manual Lymphatic Drainage (MLD);Compression [employer]

## 2021-11-09 ENCOUNTER — Ambulatory Visit (HOSPITAL_COMMUNITY): Payer: Medicare HMO | Admitting: Physical Therapy

## 2021-11-09 DIAGNOSIS — I890 Lymphedema, not elsewhere classified: Secondary | ICD-10-CM

## 2021-11-09 NOTE — Therapy (Signed)
?OUTPATIENT PHYSICAL THERAPY ONCOLOGY TREATMENT ? ?Patient Name: [name] ?MRN: [id number] ?DOB:1941-04-11, 81 y.o., female ?Today's Date: 11/09/2021 ? ? PT End of Session - 11/09/21 1305   ? ? Visit Number 7   ? Number of Visits 18   ? Date for PT Re-Evaluation 11/29/21   ? Authorization Type humana requested 12   ? Authorization Time Period 12 visits from 4/4/-->11/18/21   ? Authorization - Visit Number 7   ? Authorization - Number of Visits 12   ? PT Start Time 1315   ? PT Stop Time 1400   ? PT Time Calculation (min) 45 min   ? ?  ?  ? ?  ? ? ? ?Past Medical History:  ?Diagnosis Date  ? Allergy   ? Anemia   ? Atrial fibrillation ([place])   ? Cardiomyopathy, nonischemic ([place])   ? a. EF previously 35% in 2008 with cath showing no significant CAD b. EF improved to 55-60% in 02/2020  ? Cataract   ? Cerebrovascular accident [facility])   ? No clinical event; asymptomatic CT finding  ? CHF (congestive heart failure) ([place])   ? Chronic kidney disease   ? COPD (chronic obstructive pulmonary disease) ([place])   ? DDD (degenerative disc disease), cervical   ? Cervical  ? Degenerative joint disease   ? Right hip and knee  ? Essential hypertension   ? GERD (gastroesophageal reflux disease)   ? Hip dislocation, right ([place])   ? Hyperlipemia   ? Hypothyroid   ? LBBB (left bundle branch block)   ? Pacemaker   ? S/P colonoscopy 2009  ? Dr. Sharlett Iles: reportedly normal per pt  ? S/P endoscopy 1980s  ? ?Past Surgical History:  ?Procedure Laterality Date  ? ABDOMINAL HYSTERECTOMY    ? APPENDECTOMY    ? BI-VENTRICULAR IMPLANTABLE CARDIOVERTER DEFIBRILLATOR  (CRT-D)  10-31-2013  ? MDT Hillery Aldo CRTD implanted by Dr Lovena Le  ? BIOPSY  12/18/2018  ? Procedure: BIOPSY;  Surgeon: Daneil Dolin, MD;  [facility];  Service: Endoscopy;;  Gastric ?  ? BIV PACEMAKER INSERTION CRT-P N/A 12/17/2020  ? Procedure: DOWNGRAD TO BIV PACEMAKER INSERTION CRT-P;  Surgeon: Evans Lance, MD;  Location: [place] CV LAB;  Service: Cardiovascular;   Laterality: N/A;  ? BREAST BIOPSY    ? bilaterally; benign  ? can not have MRI due to jaw surgery    ? CATARACT EXTRACTION W/PHACO Left 03/28/2021  ? Procedure: CATARACT EXTRACTION PHACO AND INTRAOCULAR LENS PLACEMENT (IOC);  Surgeon: Baruch Goldmann, MD;  Location: [facility] OR[facility];  Service: Ophthalmology;  Laterality: Left;  CDE  17.17  ? CATARACT EXTRACTION W/PHACO Right 04/11/2021  ? Procedure: CATARACT EXTRACTION PHACO AND INTRAOCULAR LENS PLACEMENT RIGHT EYE;  Surgeon: Baruch Goldmann, MD;  Location: [facility] OR[facility];  Service: Ophthalmology;  Laterality: Right;  right ?CDE=17.03  ? CHOLECYSTECTOMY    ? COLONOSCOPY  2009  ? Dr. Sharlett Iles: Normal  ? ESOPHAGOGASTRODUODENOSCOPY  07/13/2011  ? small hh, patchy gastric erythema with scarring deformity of antrum, chronic gastritis, bx benign with no H.Pylori. Procedure: ESOPHAGOGASTRODUODENOSCOPY (EGD);  Surgeon: Daneil Dolin, MD;  [facility];  Service: Endoscopy;  Laterality: N/A;  10:45  ? ESOPHAGOGASTRODUODENOSCOPY N/A 12/18/2018  ? Dr. Gala Romney: normal esophagus s/p dilation, small hh, erythema in stomach with benign biopsy.   ? IMPLANTABLE CARDIOVERTER DEFIBRILLATOR IMPLANT N/A 10/31/2013  ? Procedure: IMPLANTABLE CARDIOVERTER DEFIBRILLATOR IMPLANT;  Surgeon: Evans Lance, MD;  Location: [facility] CATH LAB;  Service: Cardiovascular;  Laterality: N/A;  ? MALONEY DILATION N/A 12/18/2018  ? Procedure: MALONEY DILATION;  Surgeon: Daneil Dolin, MD;  [facility];  Service: Endoscopy;  Laterality: N/A;  ? MANDIBLE SURGERY    ? secondary to malocclusion  ? 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 Ureteral Surgery  1970  ? ?Patient Active Problem List  ? Diagnosis Date Noted  ? Vitamin D deficiency 07/14/2019  ? Esophageal dysphagia 12/10/2018  ? RUQ pain 09/17/2017  ? Right flank pain 09/17/2017  ? Biventricular ICD (implantable cardioverter-defibrillator) in place 11/17/2013  ? Atrial fibrillation ([place]) 11/17/2013  ? Chronic systolic heart failure ([place]) 10/13/2013  ? Hyponatremia  03/04/2013  ? Constipation 09/13/2011  ? GERD 09/13/2010  ? Hypothyroidism 04/28/2009  ? Hyperlipidemia 04/28/2009  ? Hypertension 04/28/2009  ? COPD (chronic obstructive pulmonary disease) ([place]) 04/28/2009  ? LEFT BUNDLE BRANCH BLOCK 04/28/2009  ? DEGENERATIVE JOINT DISEASE 04/28/2009  ? ? ?PCP: Celene Squibb, MD ? ?REFERRING PROVIDER: Erma Heritage, PA-C ? ?REFERRING DIAG: lymphedema  ? ?THERAPY DIAG:  ?Lymphedema, not elsewhere classified ? ?ONSET DATE: chronic ? ?SUBJECTIVE                                                                                                                                                                                          ? ?SUBJECTIVE STATEMENT:  PT states that the back of her left leg feels much better, but her back remains a 10/10. Her short stretch did not come in yet but her husband tracked it down and it should be in tomorrow.  PT agreeable to profore lite compression.  Pt states the man from the pump company called and is delivering her pump.   ?PERTINENT HISTORY: [name] is a 81 y.o. female with past medical history of HFimpEF (EF previously 35% in 2008 with cath showing no significant CAD, at 55-60% in 02/2020), LBBB, s/p Medtronic biventricular ICD placement, paroxysmal atrial fibrillation, HTN, GERD and DJD  ? ?PAIN:  ?Are you having pain? Yes needs surgery but is not a candidate.  ?NPRS scale: 10/10 ?Pain location: Lower back ?Pain orientation: Bilateral  ?PAIN TYPE: aching ?Pain description: constant  ?Aggravating factors: Wt bearing  ?Relieving factors: shots  ?This is not what we are seeing pt for therefore we will not address this in our treatment plan.   ? ?PRECAUTIONS: Other: cellulitis  ? ?WEIGHT BEARING RESTRICTIONS No ? ?FALLS:  ?Has patient fallen in last 6 months? No ? ?LIVING ENVIRONMENT: ?Lives with: lives with their family and lives with their son ?Lives in: House/apartment ?Stairs: No ?Has following equipment at home: Gilford Rile - 2  wheeled ? ?OCCUPATION: retired  ? ?LEISURE: would love to get back planting flowers  ? ?  ?PRIOR LEVEL OF FUNCTION: Independent with basic ADLs ? ?PATIENT GOALS less edema, to move better  ? ? ?OBJECTIVE ? ?COGNITION: ? Overall cognitive status: Within functional limits for tasks assessed  ? ?PALPATION: Increased induration posterior aspect of B LE  and thighs  ? ?OBSERVATIONS / OTHER ASSESSMENTS: weeping noted B, induration,  ?Paplliomas B  ? ? ?LYMPHEDEMA ASSESSMENTS:  ? ?INFECTIONS: no ? ?LYMPHEDEMA ASSESSMENTS:  ? ?LE LANDMARK RIGHT ?10/18/2021 Right  ?11/02/21 Right  ?11/09/2021  ?At groin     ?30 cm proximal to suprapatella     ?20 cm proximal to suprapatella   70 66 67.5  ?10 cm proximal to suprapatella   63 ? 59.6 61  ?At midpatella / popliteal crease   53.5 51.3  49.4  ?30 cm proximal to floor at lateral plantar foot   45.6 42  43.0  ?20 cm proximal to floor at lateral plantar foot   39.3 32.8  38  ?10 cm proximal to floor at lateral plantar foot   35 28.5  33  ?Circumference of ankle/heel   38 33.5  36.8  ?5 cm proximal to 1st MTP joint   26 23.4  24  ?Across MTP joint   24.3 23.6 24.5  ?Around proximal great toe  9.3   ?(Blank rows = not tested) ? ?LE LANDMARK LEFT ?10/18/2021 Left  ?11/02/21 Left  ?11/09/2021  ?At groin     ?30 cm proximal to suprapatella     ?20 cm proximal to suprapatella   66.2 63.8 66  ?10 cm proximal to suprapatella   61.2 59 59.5  ?At midpatella / popliteal crease   52.1 47.4 48.7  ?30 cm proximal to floor at lateral plantar foot   47 41.2 46.2  ?20 cm proximal to floor at lateral plantar foot   41.5 33.4 43  ?10 cm proximal to floor at lateral plantar foot   37.3 29 46.3  ?Circumference of ankle/heel   39 33.6 37  ?5 cm proximal to 1st MTP joint   26.7 23.4 26.3  ?Across MTP joint  25 24.6 25.2  ?Around proximal great toe  8.4   ?(Blank rows = not tested) ? ? ? ? ? ?TODAY'S TREATMENT  ? ? 11/09/21 0001  ?Manual Therapy  ?Manual Therapy Manual Lymphatic Drainage (MLD);Compression  Bandaging;Other (comment)  ?Manual therapy comments Manual complete separate than rest of tx  ?Manual Lymphatic Drainage (MLD) Sitting  position with legs elevated  on black stool; HMP to low back for comfort:  short neck, superfi

## 2021-11-11 ENCOUNTER — Encounter (HOSPITAL_COMMUNITY): Payer: Self-pay

## 2021-11-11 ENCOUNTER — Ambulatory Visit (HOSPITAL_COMMUNITY): Payer: Medicare HMO

## 2021-11-11 DIAGNOSIS — I890 Lymphedema, not elsewhere classified: Secondary | ICD-10-CM | POA: Diagnosis not present

## 2021-11-11 NOTE — Therapy (Addendum)
?OUTPATIENT PHYSICAL THERAPY ONCOLOGY TREATMENT ? ?Patient Name: [name] ?MRN: [id number] ?DOB:Dec 15, 1940, 81 y.o., female ?Today's Date: 11/11/2021 ? ? PT End of Session - 11/11/21 1016   ? ? Visit Number 8   ? Number of Visits 18   ? Date for PT Re-Evaluation 11/29/21   ? Authorization Type humana requested 12   ? Authorization Time Period 12 visits from 4/4/-->11/18/21   ? Authorization - Visit Number 8   ? Authorization - Number of Visits 12   ? PT Start Time 7673   ? PT Stop Time 1005   ? PT Time Calculation (min) 50 min   ? Activity Tolerance Patient tolerated treatment well   ? Behavior During Therapy [facility] for tasks assessed/performed   ? ?  ?  ? ?  ? ? ? ? ?Past Medical History:  ?Diagnosis Date  ? Allergy   ? Anemia   ? Atrial fibrillation ([place])   ? Cardiomyopathy, nonischemic ([place])   ? a. EF previously 35% in 2008 with cath showing no significant CAD b. EF improved to 55-60% in 02/2020  ? Cataract   ? Cerebrovascular accident [facility])   ? No clinical event; asymptomatic CT finding  ? CHF (congestive heart failure) ([place])   ? Chronic kidney disease   ? COPD (chronic obstructive pulmonary disease) ([place])   ? DDD (degenerative disc disease), cervical   ? Cervical  ? Degenerative joint disease   ? Right hip and knee  ? Essential hypertension   ? GERD (gastroesophageal reflux disease)   ? Hip dislocation, right ([place])   ? Hyperlipemia   ? Hypothyroid   ? LBBB (left bundle branch block)   ? Pacemaker   ? S/P colonoscopy 2009  ? Dr. Sharlett Iles: reportedly normal per pt  ? S/P endoscopy 1980s  ? ?Past Surgical History:  ?Procedure Laterality Date  ? ABDOMINAL HYSTERECTOMY    ? APPENDECTOMY    ? BI-VENTRICULAR IMPLANTABLE CARDIOVERTER DEFIBRILLATOR  (CRT-D)  10-31-2013  ? MDT Hillery Aldo CRTD implanted by Dr Lovena Le  ? BIOPSY  12/18/2018  ? Procedure: BIOPSY;  Surgeon: Daneil Dolin, MD;  [facility];  Service: Endoscopy;;  Gastric ?  ? BIV PACEMAKER INSERTION CRT-P N/A 12/17/2020  ? Procedure: DOWNGRAD TO BIV  PACEMAKER INSERTION CRT-P;  Surgeon: Evans Lance, MD;  Location: [place] CV LAB;  Service: Cardiovascular;  Laterality: N/A;  ? BREAST BIOPSY    ? bilaterally; benign  ? can not have MRI due to jaw surgery    ? CATARACT EXTRACTION W/PHACO Left 03/28/2021  ? Procedure: CATARACT EXTRACTION PHACO AND INTRAOCULAR LENS PLACEMENT (IOC);  Surgeon: Baruch Goldmann, MD;  Location: [facility];  Service: Ophthalmology;  Laterality: Left;  CDE  17.17  ? CATARACT EXTRACTION W/PHACO Right 04/11/2021  ? Procedure: CATARACT EXTRACTION PHACO AND INTRAOCULAR LENS PLACEMENT RIGHT EYE;  Surgeon: Baruch Goldmann, MD;  Location: [facility];  Service: Ophthalmology;  Laterality: Right;  right ?CDE=17.03  ? CHOLECYSTECTOMY    ? COLONOSCOPY  2009  ? Dr. Sharlett Iles: Normal  ? ESOPHAGOGASTRODUODENOSCOPY  07/13/2011  ? small hh, patchy gastric erythema with scarring deformity of antrum, chronic gastritis, bx benign with no H.Pylori. Procedure: ESOPHAGOGASTRODUODENOSCOPY (EGD);  Surgeon: Daneil Dolin, MD;  [facility];  Service: Endoscopy;  Laterality: N/A;  10:45  ? ESOPHAGOGASTRODUODENOSCOPY N/A 12/18/2018  ? Dr. Gala Romney: normal esophagus s/p dilation, small hh, erythema in stomach with benign biopsy.   ? IMPLANTABLE CARDIOVERTER DEFIBRILLATOR IMPLANT N/A 10/31/2013  ?  Procedure: IMPLANTABLE CARDIOVERTER DEFIBRILLATOR IMPLANT;  Surgeon: Evans Lance, MD;  Location[facility] CATH LAB;  Service: Cardiovascular;  Laterality: N/A;  ? MALONEY DILATION N/A 12/18/2018  ? Procedure: MALONEY DILATION;  Surgeon: Daneil Dolin, MD;  [facility];  Service: Endoscopy;  Laterality: N/A;  ? MANDIBLE SURGERY    ? secondary to malocclusion  ? 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 Ureteral Surgery  1970  ? ?Patient Active Problem List  ? Diagnosis Date Noted  ? Vitamin D deficiency 07/14/2019  ? Esophageal dysphagia 12/10/2018  ? RUQ pain 09/17/2017  ? Right flank pain 09/17/2017  ? Biventricular ICD (implantable cardioverter-defibrillator) in place  11/17/2013  ? Atrial fibrillation ([place]) 11/17/2013  ? Chronic systolic heart failure ([place]) 10/13/2013  ? Hyponatremia 03/04/2013  ? Constipation 09/13/2011  ? GERD 09/13/2010  ? Hypothyroidism 04/28/2009  ? Hyperlipidemia 04/28/2009  ? Hypertension 04/28/2009  ? COPD (chronic obstructive pulmonary disease) ([place]) 04/28/2009  ? LEFT BUNDLE BRANCH BLOCK 04/28/2009  ? DEGENERATIVE JOINT DISEASE 04/28/2009  ? ? ?PCP: Celene Squibb, MD ? ?REFERRING PROVIDER: Erma Heritage, PA-C ? ?REFERRING DIAG: lymphedema  ? ?THERAPY DIAG:  ?Lymphedema, not elsewhere classified ? ?ONSET DATE: chronic ? ?SUBJECTIVE                                                                                                                                                                                          ? ?SUBJECTIVE STATEMENT:  Pt stated she always has LBP 10/10.  Reports she received the short stretch bandages in mail, lost power this morning and forgot to bring with today. ? ?PERTINENT HISTORY: [name] is a 81 y.o. female with past medical history of HFimpEF (EF previously 35% in 2008 with cath showing no significant CAD, at 55-60% in 02/2020), LBBB, s/p Medtronic biventricular ICD placement, paroxysmal atrial fibrillation, HTN, GERD and DJD  ? ?PAIN:  ?Are you having pain? Yes needs surgery but is not a candidate.  ?NPRS scale: 10/10 ?Pain location: Lower back ?Pain orientation: Bilateral  ?PAIN TYPE: aching ?Pain description: constant  ?Aggravating factors: Wt bearing  ?Relieving factors: shots  ?This is not what we are seeing pt for therefore we will not address this in our treatment plan.   ? ?PRECAUTIONS: Other: cellulitis  ? ?WEIGHT BEARING RESTRICTIONS No ? ?FALLS:  ?Has patient fallen in last 6 months? No ? ?LIVING ENVIRONMENT: ?Lives with: lives with their family and lives with their son ?Lives in: House/apartment ?Stairs: No ?Has following equipment at home: Gilford Rile - 2 wheeled ? ?OCCUPATION: retired  ? ?LEISURE: would  love to get back planting  flowers  ? ?  ?PRIOR LEVEL OF FUNCTION: Independent with basic ADLs ? ?PATIENT GOALS less edema, to move better  ? ? ?OBJECTIVE ? ?COGNITION: ? Overall cognitive status: Within functional limits for tasks assessed  ? ?PALPATION: Increased induration posterior aspect of B LE  and thighs  ? ?OBSERVATIONS / OTHER ASSESSMENTS: weeping noted B, induration,  ?Paplliomas B  ? ? ?LYMPHEDEMA ASSESSMENTS:  ? ?INFECTIONS: no ? ?LYMPHEDEMA ASSESSMENTS:  ? ?LE LANDMARK RIGHT ?10/18/2021 Right  ?11/02/21 Right  ?11/09/2021  ?At groin     ?30 cm proximal to suprapatella     ?20 cm proximal to suprapatella   70 66 67.5  ?10 cm proximal to suprapatella   63 ? 59.6 61  ?At midpatella / popliteal crease   53.5 51.3  49.4  ?30 cm proximal to floor at lateral plantar foot   45.6 42  43.0  ?20 cm proximal to floor at lateral plantar foot   39.3 32.8  38  ?10 cm proximal to floor at lateral plantar foot   35 28.5  33  ?Circumference of ankle/heel   38 33.5  36.8  ?5 cm proximal to 1st MTP joint   26 23.4  24  ?Across MTP joint   24.3 23.6 24.5  ?Around proximal great toe  9.3   ?Weight   11/11/21 235.2#  ?(Blank rows = not tested) ? ?LE LANDMARK LEFT ?10/18/2021 Left  ?11/02/21 Left  ?11/09/2021  ?At groin     ?30 cm proximal to suprapatella     ?20 cm proximal to suprapatella   66.2 63.8 66  ?10 cm proximal to suprapatella   61.2 59 59.5  ?At midpatella / popliteal crease   52.1 47.4 48.7  ?30 cm proximal to floor at lateral plantar foot   47 41.2 46.2  ?20 cm proximal to floor at lateral plantar foot   41.5 33.4 43  ?10 cm proximal to floor at lateral plantar foot   37.3 29 46.3  ?Circumference of ankle/heel   39 33.6 37  ?5 cm proximal to 1st MTP joint   26.7 23.4 26.3  ?Across MTP joint  25 24.6 25.2  ?Around proximal great toe  8.4   ?(Blank rows = not tested) ? ? ? ? ? ?TODAY'S TREATMENT  ? ? ? 11/11/21 0001  ?Manual Therapy  ?Manual Therapy Manual Lymphatic Drainage (MLD);Compression Bandaging;Other (comment)   ?Manual therapy comments Manual complete separate than rest of tx  ?Manual Lymphatic Drainage (MLD) Seated with LE elevated, MHP on back for pain control:  short neck, superficial then deep abdominal, inginueal to

## 2021-11-14 ENCOUNTER — Ambulatory Visit (HOSPITAL_COMMUNITY): Payer: Medicare HMO | Attending: Student | Admitting: Physical Therapy

## 2021-11-14 ENCOUNTER — Encounter (HOSPITAL_COMMUNITY): Payer: Self-pay | Admitting: Physical Therapy

## 2021-11-14 DIAGNOSIS — I890 Lymphedema, not elsewhere classified: Secondary | ICD-10-CM | POA: Insufficient documentation

## 2021-11-14 NOTE — Therapy (Signed)
OUTPATIENT PHYSICAL THERAPY ONCOLOGY TREATMENT  Patient Name: [name] MRN: [id number] DOB:Oct 04, 1940, 81 y.o., female Today's Date: 11/14/2021   PT End of Session - 11/14/21 1155     Visit Number 9    Number of Visits 18    Date for PT Re-Evaluation 11/29/21    Authorization Type humana requested 12    Authorization Time Period 12 visits from 4/4/-->11/18/21    Authorization - Visit Number 9    Authorization - Number of Visits 12    PT Start Time 1005    PT Stop Time 1105    PT Time Calculation (min) 60 min    Activity Tolerance Patient tolerated treatment well    Behavior During Therapy WFL for tasks assessed/performed               Past Medical History:  Diagnosis Date   Allergy    Anemia    Atrial fibrillation (HCC)    Cardiomyopathy, nonischemic (HCC)    a. EF previously 35% in 2008 with cath showing no significant CAD b. EF improved to 55-60% in 02/2020   Cataract    Cerebrovascular accident [facility])    No clinical event; asymptomatic CT finding   CHF (congestive heart failure) (HCC)    Chronic kidney disease    COPD (chronic obstructive pulmonary disease) (HCC)    DDD (degenerative disc disease), cervical    Cervical   Degenerative joint disease    Right hip and knee   Essential hypertension    GERD (gastroesophageal reflux disease)    Hip dislocation, right (HCC)    Hyperlipemia    Hypothyroid    LBBB (left bundle branch block)    Pacemaker    S/P colonoscopy 2009   Dr. Jarold Motto: reportedly normal per pt   S/P endoscopy 1980s   Past Surgical History:  Procedure Laterality Date   ABDOMINAL HYSTERECTOMY     APPENDECTOMY     BI-VENTRICULAR IMPLANTABLE CARDIOVERTER DEFIBRILLATOR  (CRT-D)  10-31-2013   MDT Ovidio Kin CRTD implanted by Dr Ladona Ridgel   BIOPSY  12/18/2018   Procedure: BIOPSY;  Surgeon: Corbin Ade, MD;  [facility];  Service: Endoscopy;;  Gastric    BIV PACEMAKER INSERTION CRT-P N/A 12/17/2020   Procedure: DOWNGRAD TO BIV  PACEMAKER INSERTION CRT-P;  Surgeon: Marinus Maw, MD;  Location: [facility] INVASIVE CV LAB;  Service: Cardiovascular;  Laterality: N/A;   BREAST BIOPSY     bilaterally; benign   can not have MRI due to jaw surgery     CATARACT EXTRACTION W/PHACO Left 03/28/2021   Procedure: CATARACT EXTRACTION PHACO AND INTRAOCULAR LENS PLACEMENT (IOC);  Surgeon: Fabio Pierce, MD;  Location: [facility] ORS;  Service: Ophthalmology;  Laterality: Left;  CDE  17.17   CATARACT EXTRACTION W/PHACO Right 04/11/2021   Procedure: CATARACT EXTRACTION PHACO AND INTRAOCULAR LENS PLACEMENT RIGHT EYE;  Surgeon: Fabio Pierce, MD;  Location: [facility] ORS;  Service: Ophthalmology;  Laterality: Right;  right CDE=17.03   CHOLECYSTECTOMY     COLONOSCOPY  2009   Dr. Jarold Motto: Normal   ESOPHAGOGASTRODUODENOSCOPY  07/13/2011   small hh, patchy gastric erythema with scarring deformity of antrum, chronic gastritis, bx benign with no H.Pylori. Procedure: ESOPHAGOGASTRODUODENOSCOPY (EGD);  Surgeon: Corbin Ade, MD;  [facility];  Service: Endoscopy;  Laterality: N/A;  10:45   ESOPHAGOGASTRODUODENOSCOPY N/A 12/18/2018   Dr. Jena Gauss: normal esophagus s/p dilation, small hh, erythema in stomach with benign biopsy.    IMPLANTABLE CARDIOVERTER DEFIBRILLATOR IMPLANT N/A 10/31/2013  Procedure: IMPLANTABLE CARDIOVERTER DEFIBRILLATOR IMPLANT;  Surgeon: Marinus Maw, MD;  Location: [facility] CATH LAB;  Service: Cardiovascular;  Laterality: N/A;   MALONEY DILATION N/A 12/18/2018   Procedure: [name] DILATION;  Surgeon: Corbin Ade, MD;  [facility];  Service: Endoscopy;  Laterality: N/A;   MANDIBLE SURGERY     secondary to malocclusion   THYROIDECTOMY     neoplasm   Ureteral Surgery  1970   Patient Active Problem List   Diagnosis Date Noted   Vitamin D deficiency 07/14/2019   Esophageal dysphagia 12/10/2018   RUQ pain 09/17/2017   Right flank pain 09/17/2017   Biventricular ICD (implantable cardioverter-defibrillator) in place  11/17/2013   Atrial fibrillation (HCC) 11/17/2013   Chronic systolic heart failure (HCC) 10/13/2013   Hyponatremia 03/04/2013   Constipation 09/13/2011   GERD 09/13/2010   Hypothyroidism 04/28/2009   Hyperlipidemia 04/28/2009   Hypertension 04/28/2009   COPD (chronic obstructive pulmonary disease) (HCC) 04/28/2009   LEFT BUNDLE BRANCH BLOCK 04/28/2009   DEGENERATIVE JOINT DISEASE 04/28/2009    PCP: Benita Stabile, MD  REFERRING PROVIDER: Ellsworth Lennox, PA-C  REFERRING DIAG: lymphedema   THERAPY DIAG:  No diagnosis found.  ONSET DATE: chronic  SUBJECTIVE                                                                                                                                                                                           SUBJECTIVE STATEMENT:  Pt states her Rt hip is hurting even worse today and requests to be wrapped only.  States she always has LBP 10/10.  Pt comes today with her stretch bandages and is not happy about returning to using foam but willing to give it a try.  PERTINENT HISTORY: YANIAH Walton is a 81 y.o. female with past medical history of HFimpEF (EF previously 35% in 2008 with cath showing no significant CAD, at 55-60% in 02/2020), LBBB, s/p Medtronic biventricular ICD placement, paroxysmal atrial fibrillation, HTN, GERD and DJD   PAIN:  Are you having pain? Yes needs surgery but is not a candidate.  NPRS scale: 10/10 Pain location: Lower back and Rt hip greater than 10/10 Pain orientation: Bilateral  PAIN TYPE: aching Pain description: constant  Aggravating factors: Wt bearing  Relieving factors: shots  This is not what we are seeing pt for therefore we will not address this in our treatment plan.    PRECAUTIONS: Other: cellulitis   WEIGHT BEARING RESTRICTIONS No  FALLS:  Has patient fallen in last 6 months? No  LIVING ENVIRONMENT: Lives with: lives with their family and lives with their son Lives in:  House/apartment Stairs:  No Has following equipment at home: Dan Humphreys - 2 wheeled  OCCUPATION: retired   LEISURE: would love to get back planting flowers     PRIOR LEVEL OF FUNCTION: Independent with basic ADLs  PATIENT GOALS less edema, to move better    OBJECTIVE  COGNITION:  Overall cognitive status: Within functional limits for tasks assessed   PALPATION: Increased induration posterior aspect of B LE  and thighs   OBSERVATIONS / OTHER ASSESSMENTS: weeping noted B, induration,  Paplliomas B    LYMPHEDEMA ASSESSMENTS:   INFECTIONS: no  LYMPHEDEMA ASSESSMENTS:   LE LANDMARK RIGHT 10/18/21 Right  11/02/21 Right  11/09/21  At groin     30 cm proximal to suprapatella     20 cm proximal to suprapatella   70 66 67.5  10 cm proximal to suprapatella   63  59.6 61  At midpatella / popliteal crease   53.5 51.3  49.4  30 cm proximal to floor at lateral plantar foot   45.6 42  43.0  20 cm proximal to floor at lateral plantar foot   39.3 32.8  38  10 cm proximal to floor at lateral plantar foot   35 28.5  33  Circumference of ankle/heel   38 33.5  36.8  5 cm proximal to 1st MTP joint   26 23.4  24  Across MTP joint   24.3 23.6 24.5  Around proximal great toe  9.3   Weight   11/11/21 235.2#  (Blank rows = not tested)  LE LANDMARK LEFT 10/18/21 Left  11/02/21 Left  11/09/21  At groin     30 cm proximal to suprapatella     20 cm proximal to suprapatella   66.2 63.8 66  10 cm proximal to suprapatella   61.2 59 59.5  At midpatella / popliteal crease   52.1 47.4 48.7  30 cm proximal to floor at lateral plantar foot   47 41.2 46.2  20 cm proximal to floor at lateral plantar foot   41.5 33.4 43  10 cm proximal to floor at lateral plantar foot   37.3 29 46.3  Circumference of ankle/heel   39 33.6 37  5 cm proximal to 1st MTP joint   26.7 23.4 26.3  Across MTP joint  25 24.6 25.2  Around proximal great toe  8.4   (Blank rows = not tested)      TODAY'S TREATMENT    11/14/21 0001  Manual Therapy   Manual Therapy Manual Lymphatic Drainage (MLD);Compression Bandaging;Other (comment)  Manual therapy comments Manual complete separate than rest of tx  Manual Lymphatic Drainage (MLD) Seated with LE elevated,  short neck, superficial then deep abdominal, inginueal to axillary anastomosis BLE; abbreviated session today due to increased pain and request to hold manual  Compression Bandaging Distal LE's with 1/2" foam and 4 layers of short stretch (1X 6cm, 1X 8cm, 2X 10cm)    11/11/21 0001  Manual Therapy  Manual Therapy Manual Lymphatic Drainage (MLD);Compression Bandaging;Other (comment)  Manual therapy comments Manual complete separate than rest of tx  Manual Lymphatic Drainage (MLD) Seated with LE elevated, MHP on back for pain control:  short neck, superficial then deep abdominal, inginueal to axillary anastomosis BLE  Compression Bandaging Profore lite BLE     11/09/21 0001  Manual Therapy  Manual Therapy Manual Lymphatic Drainage (MLD);Compression Bandaging;Other (comment)  Manual therapy comments Manual complete separate than rest of tx  Manual Lymphatic Drainage (MLD) Sitting  position with legs  elevated  on black stool; HMP to low back for comfort:  short neck, superficial then deep abdominal, inginueal to axillary anastomosis B LE.    Compression Bandaging Profore light compression bandaging B to LE>          PATIENT EDUCATION:  Education details: 10/21/21: where to buy the bandages, exercises and the importance of coming 3x a week, 4 aspects of treatment; EVal:  Education on what lymphedema is, Four aspects of treatment to include cleansing and moisturizing the skin, exercises, manual decongestive techniques and compression bandaging. 4/19:  Breathing techniques and importance of abdominal strength to assist with back pain.   Person educated: Patient Education method: Explanation, Tactile cues, Verbal cues, and Handouts Education comprehension: verbalized  understanding   HOME EXERCISE PROGRAM: At eval: Ankle pumps, LAQ, hip ab/adduction, marchingm diaphragm breathe and lymph squeeze.   ASSESSMENT:  CLINICAL IMPRESSION: Limited Manual completed in seated position with LE elevated on stool. Completed one leg at a time per request.  Began short stretch today with return of 1/2" foam. Cut the foam down to improve fit.  Moisturized LE's well prior to bandaging.  Only used 2 10cm bandages today for comfort and is all that was included in her kit.  All remaining items placed in bag in wound locker.  Pt reported overall comfort after bandaging.  Instructed to remove either Tuesday night or Wednesday morning and re-roll prior to next appt.  Pt verbalized understanding.   OBJECTIVE IMPAIRMENTS cardiopulmonary status limiting activity, decreased activity tolerance, decreased mobility, difficulty walking, increased edema, pain, and decreased skin integrity .   ACTIVITY LIMITATIONS cleaning, community activity, and dressing  .   PERSONAL FACTORS Age and 1-2 comorbidities: OA, afib   are also affecting patient's functional outcome.    REHAB POTENTIAL: Good  GOALS: Goals reviewed with patient? Yes  SHORT TERM GOALS: Target date: 12/05/2021  Weeping in LE to have stopped to decrease risk of cellulitis  Baseline: Goal status: Ongoing  2.  LE down 2-4 cm in thigh and leg,(not ankle and foot) Baseline:  Goal status: Ongoing    LONG TERM GOALS: Target date: 12/26/2021  PT to be completing exercises daily Baseline:  Goal status: Ongoing  2.  PT to have lost 4-5 cm in B LE measurements for improved skin integrity Baseline:  Goal status: Ongoing  3.  PT to have juxta fit for compression at D/C Baseline:  Goal status: Ongoing  4.  PT to have a compression pump and have used it for D/C Baseline:  Goal status: Ongoing    PLAN: PT FREQUENCY: 3x/week  PT DURATION: 6 weeks  PLANNED INTERVENTIONS: Therapeutic exercises, Patient/Family  education, Manual lymph drainage, Compression bandaging, and Manual therapy  PLAN FOR NEXT SESSION:    Continue with total decongestive bandaging. Attempt to complete full massage and begin thighs when ready; change to softer netting if needed for comfort.   Lurena Nida, PTA/CLT, Margarita Rana [phone] 11/14/2021, 2:11 PM

## 2021-11-15 ENCOUNTER — Other Ambulatory Visit: Payer: Self-pay | Admitting: Gastroenterology

## 2021-11-15 ENCOUNTER — Other Ambulatory Visit: Payer: Self-pay | Admitting: Cardiology

## 2021-11-15 NOTE — Telephone Encounter (Signed)
Pt stated she has another appointment that same time canceled per her request ?

## 2021-11-15 NOTE — Telephone Encounter (Signed)
Last ov 10/07/19

## 2021-11-16 ENCOUNTER — Encounter (HOSPITAL_COMMUNITY): Payer: Self-pay

## 2021-11-16 ENCOUNTER — Ambulatory Visit (HOSPITAL_COMMUNITY): Payer: Medicare HMO

## 2021-11-16 DIAGNOSIS — I890 Lymphedema, not elsewhere classified: Secondary | ICD-10-CM | POA: Diagnosis not present

## 2021-11-16 NOTE — Therapy (Addendum)
?OUTPATIENT PHYSICAL THERAPY ONCOLOGY TREATMENT ? ?Progress Note ?Reporting Period 10/18/21 to 11/16/21 ? ?See note below for Objective Data and Assessment of Progress/Goals.  ? ? ? ?Patient Name: [name] ?MRN: [id number] ?DOB:1941-04-26, 81 y.o., female ?Today's Date: 11/16/2021 ? ? PT End of Session - 11/16/21 1923   ? ? Visit Number 10   ? Number of Visits 18   ? Date for PT Re-Evaluation 11/29/21   ? Authorization Type humana requested 12   ? Authorization Time Period 12 visits from 4/4/-->11/18/21; [employer] paperwork complete requested 12 visits 5/8-->12/19/21   ? Authorization - Visit Number 10   ? Authorization - Number of Visits 12   ? PT Start Time 1315   ? PT Stop Time 6659   ? PT Time Calculation (min) 87 min   ? Activity Tolerance Patient tolerated treatment well   ? Behavior During Therapy [facility] for tasks assessed/performed   ? ?  ?  ? ?  ? ? ? ? ? ?Past Medical History:  ?Diagnosis Date  ? Allergy   ? Anemia   ? Atrial fibrillation ([place])   ? Cardiomyopathy, nonischemic ([place])   ? a. EF previously 35% in 2008 with cath showing no significant CAD b. EF improved to 55-60% in 02/2020  ? Cataract   ? Cerebrovascular accident [facility])   ? No clinical event; asymptomatic CT finding  ? CHF (congestive heart failure) ([place])   ? Chronic kidney disease   ? COPD (chronic obstructive pulmonary disease) ([place])   ? DDD (degenerative disc disease), cervical   ? Cervical  ? Degenerative joint disease   ? Right hip and knee  ? Essential hypertension   ? GERD (gastroesophageal reflux disease)   ? Hip dislocation, right ([place])   ? Hyperlipemia   ? Hypothyroid   ? LBBB (left bundle branch block)   ? Pacemaker   ? S/P colonoscopy 2009  ? Dr. Sharlett Iles: reportedly normal per pt  ? S/P endoscopy 1980s  ? ?Past Surgical History:  ?Procedure Laterality Date  ? ABDOMINAL HYSTERECTOMY    ? APPENDECTOMY    ? BI-VENTRICULAR IMPLANTABLE CARDIOVERTER DEFIBRILLATOR  (CRT-D)  10-31-2013  ? MDT Hillery Aldo CRTD implanted by Dr Lovena Le  ? BIOPSY  12/18/2018   ? Procedure: BIOPSY;  Surgeon: Daneil Dolin, MD;  [facility];  Service: Endoscopy;;  Gastric ?  ? BIV PACEMAKER INSERTION CRT-P N/A 12/17/2020  ? Procedure: DOWNGRAD TO BIV PACEMAKER INSERTION CRT-P;  Surgeon: Evans Lance, MD;  Location: [place] CV LAB;  Service: Cardiovascular;  Laterality: N/A;  ? BREAST BIOPSY    ? bilaterally; benign  ? can not have MRI due to jaw surgery    ? CATARACT EXTRACTION W/PHACO Left 03/28/2021  ? Procedure: CATARACT EXTRACTION PHACO AND INTRAOCULAR LENS PLACEMENT (IOC);  Surgeon: Baruch Goldmann, MD;  Location: [facility] ORS;  Service: Ophthalmology;  Laterality: Left;  CDE  17.17  ? CATARACT EXTRACTION W/PHACO Right 04/11/2021  ? Procedure: CATARACT EXTRACTION PHACO AND INTRAOCULAR LENS PLACEMENT RIGHT EYE;  Surgeon: Baruch Goldmann, MD;  Location: [facility];  Service: Ophthalmology;  Laterality: Right;  right ?CDE=17.03  ? CHOLECYSTECTOMY    ? COLONOSCOPY  2009  ? Dr. Sharlett Iles: Normal  ? ESOPHAGOGASTRODUODENOSCOPY  07/13/2011  ? small hh, patchy gastric erythema with scarring deformity of antrum, chronic gastritis, bx benign with no H.Pylori. Procedure: ESOPHAGOGASTRODUODENOSCOPY (EGD);  Surgeon: Daneil Dolin, MD;  [facility];  Service: Endoscopy;  Laterality: N/A;  10:45  ?  ESOPHAGOGASTRODUODENOSCOPY N/A 12/18/2018  ? Dr. Gala Romney: normal esophagus s/p dilation, small hh, erythema in stomach with benign biopsy.   ? IMPLANTABLE CARDIOVERTER DEFIBRILLATOR IMPLANT N/A 10/31/2013  ? Procedure: IMPLANTABLE CARDIOVERTER DEFIBRILLATOR IMPLANT;  Surgeon: Evans Lance, MD;  Location[facility] CATH LAB;  Service: Cardiovascular;  Laterality: N/A;  ? MALONEY DILATION N/A 12/18/2018  ? Procedure: MALONEY DILATION;  Surgeon: Daneil Dolin, MD;  [facility];  Service: Endoscopy;  Laterality: N/A;  ? MANDIBLE SURGERY    ? secondary to malocclusion  ? 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 Ureteral Surgery  1970  ? ?Patient Active Problem List  ? Diagnosis Date Noted  ? Vitamin D  deficiency 07/14/2019  ? Esophageal dysphagia 12/10/2018  ? RUQ pain 09/17/2017  ? Right flank pain 09/17/2017  ? Biventricular ICD (implantable cardioverter-defibrillator) in place 11/17/2013  ? Atrial fibrillation ([place]) 11/17/2013  ? Chronic systolic heart failure ([place]) 10/13/2013  ? Hyponatremia 03/04/2013  ? Constipation 09/13/2011  ? GERD 09/13/2010  ? Hypothyroidism 04/28/2009  ? Hyperlipidemia 04/28/2009  ? Hypertension 04/28/2009  ? COPD (chronic obstructive pulmonary disease) ([place]) 04/28/2009  ? LEFT BUNDLE BRANCH BLOCK 04/28/2009  ? DEGENERATIVE JOINT DISEASE 04/28/2009  ? ? ?PCP: Celene Squibb, MD ? ?REFERRING PROVIDER: Erma Heritage, PA-C ? ?REFERRING DIAG: lymphedema  ? ?THERAPY DIAG:  ?Lymphedema, not elsewhere classified ? ?ONSET DATE: chronic ? ?SUBJECTIVE                                                                                                                                                                                          ? ?SUBJECTIVE STATEMENT:  Pt stated she is always in LBP 10/10 and now Rt hip is bothering her today.  Received pump today.  Brought hydrocortizone lotion to address the itching she believes related to the foam. ? ?PERTINENT HISTORY: [name] is a 81 y.o. female with past medical history of HFimpEF (EF previously 35% in 2008 with cath showing no significant CAD, at 55-60% in 02/2020), LBBB, s/p Medtronic biventricular ICD placement, paroxysmal atrial fibrillation, HTN, GERD and DJD  ? ?PAIN:  ?Are you having pain? Yes needs surgery but is not a candidate.  ?NPRS scale: 10/10 ?Pain location: Lower back and Rt hip greater than 10/10 ?Pain orientation: Bilateral  ?PAIN TYPE: aching ?Pain description: constant  ?Aggravating factors: Wt bearing  ?Relieving factors: shots  ?This is not what we are seeing pt for therefore we will not address this in our treatment plan.   ? ?PRECAUTIONS: Other: cellulitis  ? ?WEIGHT BEARING RESTRICTIONS No ? ?FALLS:  ?Has patient  fallen in last  6 months? No ? ?LIVING ENVIRONMENT: ?Lives with: lives with their family and lives with their son ?Lives in: House/apartment ?Stairs: No ?Has following equipment at home: Gilford Rile - 2 wheeled ? ?OCCUPATION: retired  ? ?LEISURE: would love to get back planting flowers  ? ?  ?PRIOR LEVEL OF FUNCTION: Independent with basic ADLs ? ?PATIENT GOALS less edema, to move better  ? ? ?OBJECTIVE ? ?COGNITION: ? Overall cognitive status: Within functional limits for tasks assessed  ? ?PALPATION: Increased induration posterior aspect of B LE  and thighs  ? ?OBSERVATIONS / OTHER ASSESSMENTS: weeping noted B, induration,  ?Paplliomas B  ? ? ?LYMPHEDEMA ASSESSMENTS:  ? ?INFECTIONS: no ? ?LYMPHEDEMA ASSESSMENTS:  ? ?LE LANDMARK RIGHT ?10/18/21 Right  ?11/02/21 Right  ?11/09/21 Right ?11/16/21  ?At groin      ?30 cm proximal to suprapatella      ?20 cm proximal to suprapatella   70 66 67.5 65  ?10 cm proximal to suprapatella   63 ? 59.6 61 59  ?At midpatella / popliteal crease   53.5 51.3  49.4 50  ?30 cm proximal to floor at lateral plantar foot   45.6 42  43.0 41.5  ?20 cm proximal to floor at lateral plantar foot   39.3 32.8  38 35.2  ?10 cm proximal to floor at lateral plantar foot   35 28.5  33 30  ?Circumference of ankle/heel   38 33.5  36.8 34.8  ?5 cm proximal to 1st MTP joint   26 23.4  24 23.6  ?Across MTP joint   24.3 23.6 24.5 23.5  ?Around proximal great toe  9.3  8.5  ?Weight   11/11/21 235.2# 234.6#  ?(Blank rows = not tested) ? ?LE LANDMARK LEFT ?10/18/21 Left  ?11/02/21 Left  ?11/09/21 Left  ?11/16/21  ?At groin      ?30 cm proximal to suprapatella      ?20 cm proximal to suprapatella   66.2 63.8 66 63.6  ?10 cm proximal to suprapatella   61.2 59 59.5 57.8  ?At midpatella / popliteal crease   52.1 47.4 48.7 4935  ?30 cm proximal to floor at lateral plantar foot   47 41.2 46.2 44.2  ?20 cm proximal to floor at lateral plantar foot   41.5 33.4 43 37.6  ?10 cm proximal to floor at lateral plantar foot   37.3 29 46.3  33.5  ?Circumference of ankle/heel   39 33.6 37 35.5  ?5 cm proximal to 1st MTP joint   26.7 23.4 26.3 25.2  ?Across MTP joint  25 24.6 25.2 24.5  ?Around proximal great toe  8.4  8  ?(Blank rows = not t

## 2021-11-18 ENCOUNTER — Encounter (HOSPITAL_COMMUNITY): Payer: Self-pay

## 2021-11-18 ENCOUNTER — Ambulatory Visit (HOSPITAL_COMMUNITY): Payer: Medicare HMO

## 2021-11-18 ENCOUNTER — Encounter: Payer: Self-pay | Admitting: Internal Medicine

## 2021-11-18 DIAGNOSIS — I890 Lymphedema, not elsewhere classified: Secondary | ICD-10-CM | POA: Diagnosis not present

## 2021-11-18 NOTE — Therapy (Signed)
?OUTPATIENT PHYSICAL THERAPY ONCOLOGY TREATMENT ? ?Progress Note ?Reporting Period 10/18/21 to 11/16/21 ? ?See note below for Objective Data and Assessment of Progress/Goals.  ? ? ? ?Patient Name: [name] ?MRN: [id number] ?DOB:06-Mar-1941, 81 y.o., female ?Today's Date: 11/18/2021 ? ? PT End of Session - 11/18/21 1410   ? ? Visit Number 11   ? Number of Visits 18   ? Date for PT Re-Evaluation 11/29/21   ? Authorization Type humana requested 12   ? Authorization Time Period 12 visits from 4/4/-->11/18/21; [employer] paperwork complete requested 12 visits 5/8-->12/19/21   ? Authorization - Visit Number 11   ? Authorization - Number of Visits 12   ? PT Start Time 1315   ? PT Stop Time 1405   ? PT Time Calculation (min) 50 min   ? Activity Tolerance Patient tolerated treatment well   ? Behavior During Therapy [facility] for tasks assessed/performed   ? ?  ?  ? ?  ? ? ? ? ? ?Past Medical History:  ?Diagnosis Date  ? Allergy   ? Anemia   ? Atrial fibrillation ([place])   ? Cardiomyopathy, nonischemic ([place])   ? a. EF previously 35% in 2008 with cath showing no significant CAD b. EF improved to 55-60% in 02/2020  ? Cataract   ? Cerebrovascular accident [facility])   ? No clinical event; asymptomatic CT finding  ? CHF (congestive heart failure) ([place])   ? Chronic kidney disease   ? COPD (chronic obstructive pulmonary disease) ([place])   ? DDD (degenerative disc disease), cervical   ? Cervical  ? Degenerative joint disease   ? Right hip and knee  ? Essential hypertension   ? GERD (gastroesophageal reflux disease)   ? Hip dislocation, right ([place])   ? Hyperlipemia   ? Hypothyroid   ? LBBB (left bundle branch block)   ? Pacemaker   ? S/P colonoscopy 2009  ? Dr. Sharlett Iles: reportedly normal per pt  ? S/P endoscopy 1980s  ? ?Past Surgical History:  ?Procedure Laterality Date  ? ABDOMINAL HYSTERECTOMY    ? APPENDECTOMY    ? BI-VENTRICULAR IMPLANTABLE CARDIOVERTER DEFIBRILLATOR  (CRT-D)  10-31-2013  ? MDT Hillery Aldo CRTD implanted by Dr Lovena Le  ? BIOPSY  12/18/2018   ? Procedure: BIOPSY;  Surgeon: Daneil Dolin, MD;  [facility];  Service: Endoscopy;;  Gastric ?  ? BIV PACEMAKER INSERTION CRT-P N/A 12/17/2020  ? Procedure: DOWNGRAD TO BIV PACEMAKER INSERTION CRT-P;  Surgeon: Evans Lance, MD;  Location: [place] CV LAB;  Service: Cardiovascular;  Laterality: N/A;  ? BREAST BIOPSY    ? bilaterally; benign  ? can not have MRI due to jaw surgery    ? CATARACT EXTRACTION W/PHACO Left 03/28/2021  ? Procedure: CATARACT EXTRACTION PHACO AND INTRAOCULAR LENS PLACEMENT (IOC);  Surgeon: Baruch Goldmann, MD;  Location: [facility] ORS;  Service: Ophthalmology;  Laterality: Left;  CDE  17.17  ? CATARACT EXTRACTION W/PHACO Right 04/11/2021  ? Procedure: CATARACT EXTRACTION PHACO AND INTRAOCULAR LENS PLACEMENT RIGHT EYE;  Surgeon: Baruch Goldmann, MD;  Location: [facility] ORS;  Service: Ophthalmology;  Laterality: Right;  right ?CDE=17.03  ? CHOLECYSTECTOMY    ? COLONOSCOPY  2009  ? Dr. Sharlett Iles: Normal  ? ESOPHAGOGASTRODUODENOSCOPY  07/13/2011  ? small hh, patchy gastric erythema with scarring deformity of antrum, chronic gastritis, bx benign with no H.Pylori. Procedure: ESOPHAGOGASTRODUODENOSCOPY (EGD);  Surgeon: Daneil Dolin, MD;  [facility];  Service: Endoscopy;  Laterality: N/A;  10:45  ?  ESOPHAGOGASTRODUODENOSCOPY N/A 12/18/2018  ? Dr. Gala Romney: normal esophagus s/p dilation, small hh, erythema in stomach with benign biopsy.   ? IMPLANTABLE CARDIOVERTER DEFIBRILLATOR IMPLANT N/A 10/31/2013  ? Procedure: IMPLANTABLE CARDIOVERTER DEFIBRILLATOR IMPLANT;  Surgeon: Evans Lance, MD;  Location: [facility] CATH LAB;  Service: Cardiovascular;  Laterality: N/A;  ? MALONEY DILATION N/A 12/18/2018  ? Procedure: MALONEY DILATION;  Surgeon: Daneil Dolin, MD;  [facility];  Service: Endoscopy;  Laterality: N/A;  ? MANDIBLE SURGERY    ? secondary to malocclusion  ? 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 Ureteral Surgery  1970  ? ?Patient Active Problem List  ? Diagnosis Date Noted  ? Vitamin D  deficiency 07/14/2019  ? Esophageal dysphagia 12/10/2018  ? RUQ pain 09/17/2017  ? Right flank pain 09/17/2017  ? Biventricular ICD (implantable cardioverter-defibrillator) in place 11/17/2013  ? Atrial fibrillation ([place]) 11/17/2013  ? Chronic systolic heart failure ([place]) 10/13/2013  ? Hyponatremia 03/04/2013  ? Constipation 09/13/2011  ? GERD 09/13/2010  ? Hypothyroidism 04/28/2009  ? Hyperlipidemia 04/28/2009  ? Hypertension 04/28/2009  ? COPD (chronic obstructive pulmonary disease) ([place]) 04/28/2009  ? LEFT BUNDLE BRANCH BLOCK 04/28/2009  ? DEGENERATIVE JOINT DISEASE 04/28/2009  ? ? ?PCP: Celene Squibb, MD ? ?REFERRING PROVIDER: Erma Heritage, PA-C ? ?REFERRING DIAG: lymphedema  ? ?THERAPY DIAG:  ?Lymphedema, not elsewhere classified ? ?ONSET DATE: chronic ? ?SUBJECTIVE                                                                                                                                                                                          ? ?SUBJECTIVE STATEMENT:  Pt stated better tolerance with new liner.  C/o new Lt hip pain and does not want to do thighs today though notice reduction in size. ? ?PERTINENT HISTORY: [name] is a 81 y.o. female with past medical history of HFimpEF (EF previously 35% in 2008 with cath showing no significant CAD, at 55-60% in 02/2020), LBBB, s/p Medtronic biventricular ICD placement, paroxysmal atrial fibrillation, HTN, GERD and DJD  ? ?PAIN:  ?Are you having pain? Yes needs surgery but is not a candidate.  ?NPRS scale: 10/10 ?Pain location: Lower back and Bil hip greater than 10/10 ?Pain orientation: Bilateral  ?PAIN TYPE: aching ?Pain description: constant  ?Aggravating factors: Wt bearing  ?Relieving factors: shots  ?This is not what we are seeing pt for therefore we will not address this in our treatment plan.   ? ?PRECAUTIONS: Other: cellulitis  ? ?WEIGHT BEARING RESTRICTIONS No ? ?FALLS:  ?Has patient fallen in last 6 months? No ? ?LIVING  ENVIRONMENT: ?Lives with:  lives with their family and lives with their son ?Lives in: House/apartment ?Stairs: No ?Has following equipment at home: Gilford Rile - 2 wheeled ? ?OCCUPATION: retired  ? ?LEISURE: would love to get back planting flowers  ? ?  ?PRIOR LEVEL OF FUNCTION: Independent with basic ADLs ? ?PATIENT GOALS less edema, to move better  ? ? ?OBJECTIVE ? ?COGNITION: ? Overall cognitive status: Within functional limits for tasks assessed  ? ?PALPATION: Increased induration posterior aspect of B LE  and thighs  ? ?OBSERVATIONS / OTHER ASSESSMENTS: weeping noted B, induration,  ?Paplliomas B  ? ? ?LYMPHEDEMA ASSESSMENTS:  ? ?INFECTIONS: no ? ?LYMPHEDEMA ASSESSMENTS:  ? ?LE LANDMARK RIGHT ?10/18/21 Right  ?11/02/21 Right  ?11/09/21 Right ?11/16/21  ?At groin      ?30 cm proximal to suprapatella      ?20 cm proximal to suprapatella   70 66 67.5 65  ?10 cm proximal to suprapatella   63 ? 59.6 61 59  ?At midpatella / popliteal crease   53.5 51.3  49.4 50  ?30 cm proximal to floor at lateral plantar foot   45.6 42  43.0 41.5  ?20 cm proximal to floor at lateral plantar foot   39.3 32.8  38 35.2  ?10 cm proximal to floor at lateral plantar foot   35 28.5  33 30  ?Circumference of ankle/heel   38 33.5  36.8 34.8  ?5 cm proximal to 1st MTP joint   26 23.4  24 23.6  ?Across MTP joint   24.3 23.6 24.5 23.5  ?Around proximal great toe  9.3  8.5  ?Weight   11/11/21 235.2# 234.6#  ?(Blank rows = not tested) ? ?LE LANDMARK LEFT ?10/18/21 Left  ?11/02/21 Left  ?11/09/21 Left  ?11/16/21  ?At groin      ?30 cm proximal to suprapatella      ?20 cm proximal to suprapatella   66.2 63.8 66 63.6  ?10 cm proximal to suprapatella   61.2 59 59.5 57.8  ?At midpatella / popliteal crease   52.1 47.4 48.7 4935  ?30 cm proximal to floor at lateral plantar foot   47 41.2 46.2 44.2  ?20 cm proximal to floor at lateral plantar foot   41.5 33.4 43 37.6  ?10 cm proximal to floor at lateral plantar foot   37.3 29 46.3 33.5  ?Circumference of ankle/heel   39  33.6 37 35.5  ?5 cm proximal to 1st MTP joint   26.7 23.4 26.3 25.2  ?Across MTP joint  25 24.6 25.2 24.5  ?Around proximal great toe  8.4  8  ?(Blank rows = not tested) ? ? ? ? ? ?TODAY'S TREATMENT  ? ? 11/18/21 0

## 2021-11-18 NOTE — Telephone Encounter (Signed)
Limited refills. Has to have ov for further refills. Please arrange.  ?

## 2021-11-18 NOTE — Telephone Encounter (Signed)
Pt was made aware and verbalized understanding. Routing to the front to schedule an appt.  ?

## 2021-11-18 NOTE — Telephone Encounter (Signed)
OV made, appt letter mailed ?

## 2021-11-21 ENCOUNTER — Ambulatory Visit (HOSPITAL_COMMUNITY): Payer: Medicare HMO | Admitting: Physical Therapy

## 2021-11-21 DIAGNOSIS — I890 Lymphedema, not elsewhere classified: Secondary | ICD-10-CM

## 2021-11-21 NOTE — Therapy (Signed)
? ? ? ?Patient Name: [name] ?MRN: [id number] ?DOB:Jun 07, 1941, 81 y.o., female ?Today's Date: 11/21/2021 ? ? PT End of Session - 11/21/21 0939   ? ? Visit Number 13   ? Number of Visits 18   ? Date for PT Re-Evaluation 11/29/21   ? Authorization Type humana requested 12   ? Authorization Time Period 12 visits from 4/4/-->11/18/21; [employer] paperwork complete requested 12 visits 5/8-->12/19/21   ? Authorization - Visit Number 1   ? Authorization - Number of Visits 12   anticipating approval starting todays date.  ? Progress Note Due on Visit 18   ? PT Start Time 1000   ? PT Stop Time 1120   ? PT Time Calculation (min) 80 min   ? Activity Tolerance Patient tolerated treatment well   ? Behavior During Therapy [facility] for tasks assessed/performed   ? ?  ?  ? ?  ? ? ? ? ? ?Past Medical History:  ?Diagnosis Date  ? Allergy   ? Anemia   ? Atrial fibrillation ([place])   ? Cardiomyopathy, nonischemic ([place])   ? a. EF previously 35% in 2008 with cath showing no significant CAD b. EF improved to 55-60% in 02/2020  ? Cataract   ? Cerebrovascular accident [facility])   ? No clinical event; asymptomatic CT finding  ? CHF (congestive heart failure) ([place])   ? Chronic kidney disease   ? COPD (chronic obstructive pulmonary disease) ([place])   ? DDD (degenerative disc disease), cervical   ? Cervical  ? Degenerative joint disease   ? Right hip and knee  ? Essential hypertension   ? GERD (gastroesophageal reflux disease)   ? Hip dislocation, right ([place])   ? Hyperlipemia   ? Hypothyroid   ? LBBB (left bundle branch block)   ? Pacemaker   ? S/P colonoscopy 2009  ? Dr. Sharlett Iles: reportedly normal per pt  ? S/P endoscopy 1980s  ? ?Past Surgical History:  ?Procedure Laterality Date  ? ABDOMINAL HYSTERECTOMY    ? APPENDECTOMY    ? BI-VENTRICULAR IMPLANTABLE CARDIOVERTER DEFIBRILLATOR  (CRT-D)  10-31-2013  ? MDT Hillery Aldo CRTD implanted by Dr Lovena Le  ? BIOPSY  12/18/2018  ? Procedure: BIOPSY;  Surgeon: Daneil Dolin, MD;  [facility];  Service:  Endoscopy;;  Gastric ?  ? BIV PACEMAKER INSERTION CRT-P N/A 12/17/2020  ? Procedure: DOWNGRAD TO BIV PACEMAKER INSERTION CRT-P;  Surgeon: Evans Lance, MD;  Location: [place] CV LAB;  Service: Cardiovascular;  Laterality: N/A;  ? BREAST BIOPSY    ? bilaterally; benign  ? can not have MRI due to jaw surgery    ? CATARACT EXTRACTION W/PHACO Left 03/28/2021  ? Procedure: CATARACT EXTRACTION PHACO AND INTRAOCULAR LENS PLACEMENT (IOC);  Surgeon: Baruch Goldmann, MD;  Location: [facility];  Service: Ophthalmology;  Laterality: Left;  CDE  17.17  ? CATARACT EXTRACTION W/PHACO Right 04/11/2021  ? Procedure: CATARACT EXTRACTION PHACO AND INTRAOCULAR LENS PLACEMENT RIGHT EYE;  Surgeon: Baruch Goldmann, MD;  Location: [facility];  Service: Ophthalmology;  Laterality: Right;  right ?CDE=17.03  ? CHOLECYSTECTOMY    ? COLONOSCOPY  2009  ? Dr. Sharlett Iles: Normal  ? ESOPHAGOGASTRODUODENOSCOPY  07/13/2011  ? small hh, patchy gastric erythema with scarring deformity of antrum, chronic gastritis, bx benign with no H.Pylori. Procedure: ESOPHAGOGASTRODUODENOSCOPY (EGD);  Surgeon: Daneil Dolin, MD;  [facility];  Service: Endoscopy;  Laterality: N/A;  10:45  ? ESOPHAGOGASTRODUODENOSCOPY N/A 12/18/2018  ? Dr. Gala Romney: normal esophagus  s/p dilation, small hh, erythema in stomach with benign biopsy.   ? IMPLANTABLE CARDIOVERTER DEFIBRILLATOR IMPLANT N/A 10/31/2013  ? Procedure: IMPLANTABLE CARDIOVERTER DEFIBRILLATOR IMPLANT;  Surgeon: Evans Lance, MD;  Location: [facility] CATH LAB;  Service: Cardiovascular;  Laterality: N/A;  ? MALONEY DILATION N/A 12/18/2018  ? Procedure: MALONEY DILATION;  Surgeon: Daneil Dolin, MD;  [facility];  Service: Endoscopy;  Laterality: N/A;  ? MANDIBLE SURGERY    ? secondary to malocclusion  ? 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 Ureteral Surgery  1970  ? ?Patient Active Problem List  ? Diagnosis Date Noted  ? Vitamin D deficiency 07/14/2019  ? Esophageal dysphagia 12/10/2018  ? RUQ pain 09/17/2017  ? Right  flank pain 09/17/2017  ? Biventricular ICD (implantable cardioverter-defibrillator) in place 11/17/2013  ? Atrial fibrillation ([place]) 11/17/2013  ? Chronic systolic heart failure ([place]) 10/13/2013  ? Hyponatremia 03/04/2013  ? Constipation 09/13/2011  ? GERD 09/13/2010  ? Hypothyroidism 04/28/2009  ? Hyperlipidemia 04/28/2009  ? Hypertension 04/28/2009  ? COPD (chronic obstructive pulmonary disease) ([place]) 04/28/2009  ? LEFT BUNDLE BRANCH BLOCK 04/28/2009  ? DEGENERATIVE JOINT DISEASE 04/28/2009  ? ? ?PCP: Celene Squibb, MD ? ?REFERRING PROVIDER: Erma Heritage, PA-C ? ?REFERRING DIAG: lymphedema  ? ?THERAPY DIAG:  ?Lymphedema, not elsewhere classified ? ?ONSET DATE: chronic ? ?SUBJECTIVE                                                                                                                                                                                          ? ?SUBJECTIVE STATEMENT:  Pt states that her back and left hip pain is at a 10 when sitting, higher when walking.  Pt states that the foam is making her itch even with the cortisone and thicker stockinet.  Pt request bandaging without foam today. ? ?PERTINENT HISTORY: [name] is a 81 y.o. female with past medical history of HFimpEF (EF previously 35% in 2008 with cath showing no significant CAD, at 55-60% in 02/2020), LBBB, s/p Medtronic biventricular ICD placement, paroxysmal atrial fibrillation, HTN, GERD and DJD  ? ?PAIN:  ?Are you having pain? Yes needs surgery but is not a candidate.  ?NPRS scale: 10/10 ?Pain location: Lower back and Bil hip greater than 10/10 ?Pain orientation: Bilateral  ?PAIN TYPE: aching ?Pain description: constant  ?Aggravating factors: Wt bearing  ?Relieving factors: shots  ?This is not what we are seeing pt for therefore we will not address this in our treatment plan.   ? ?PRECAUTIONS: Other: cellulitis  ? ?WEIGHT BEARING RESTRICTIONS No ? ?FALLS:  ?Has patient fallen in last 6 months?  No ? ?LIVING  ENVIRONMENT: ?Lives with: lives with their family and lives with their son ?Lives in: House/apartment ?Stairs: No ?Has following equipment at home: Gilford Rile - 2 wheeled ? ?OCCUPATION: retired  ? ?LEISURE: would love to get back planting flowers  ? ?  ?PRIOR LEVEL OF FUNCTION: Independent with basic ADLs ? ?PATIENT GOALS less edema, to move better  ? ? ?OBJECTIVE ? ?COGNITION: ? Overall cognitive status: Within functional limits for tasks assessed  ? ?PALPATION: Increased induration posterior aspect of B LE  and thighs  ? ?OBSERVATIONS / OTHER ASSESSMENTS: weeping noted B, induration,  ?Paplliomas B  ? ? ?LYMPHEDEMA ASSESSMENTS:  ? ?INFECTIONS: no ? ?LYMPHEDEMA ASSESSMENTS:  ? ?LE LANDMARK RIGHT ?10/18/21 Right  ?11/02/21 Right  ?11/09/21 Right ?11/16/21  ?At groin      ?30 cm proximal to suprapatella      ?20 cm proximal to suprapatella   70 66 67.5 65  ?10 cm proximal to suprapatella   63 ? 59.6 61 59  ?At midpatella / popliteal crease   53.5 51.3  49.4 50  ?30 cm proximal to floor at lateral plantar foot   45.6 42  43.0 41.5  ?20 cm proximal to floor at lateral plantar foot   39.3 32.8  38 35.2  ?10 cm proximal to floor at lateral plantar foot   35 28.5  33 30  ?Circumference of ankle/heel   38 33.5  36.8 34.8  ?5 cm proximal to 1st MTP joint   26 23.4  24 23.6  ?Across MTP joint   24.3 23.6 24.5 23.5  ?Around proximal great toe  9.3  8.5  ?Weight   11/11/21 235.2# 234.6#  ?(Blank rows = not tested) ? ?LE LANDMARK LEFT ?10/18/21 Left  ?11/02/21 Left  ?11/09/21 Left  ?11/16/21  ?At groin      ?30 cm proximal to suprapatella      ?20 cm proximal to suprapatella   66.2 63.8 66 63.6  ?10 cm proximal to suprapatella   61.2 59 59.5 57.8  ?At midpatella / popliteal crease   52.1 47.4 48.7 4935  ?30 cm proximal to floor at lateral plantar foot   47 41.2 46.2 44.2  ?20 cm proximal to floor at lateral plantar foot   41.5 33.4 43 37.6  ?10 cm proximal to floor at lateral plantar foot   37.3 29 46.3 33.5  ?Circumference of ankle/heel   39  33.6 37 35.5  ?5 cm proximal to 1st MTP joint   26.7 23.4 26.3 25.2  ?Across MTP joint  25 24.6 25.2 24.5  ?Around proximal great toe  8.4  8  ?(Blank rows = not tested) ? ? ? ? ? ?TODAY'S TREATMENT  ? 11/21/21 0001  ?Man

## 2021-11-23 ENCOUNTER — Ambulatory Visit (HOSPITAL_COMMUNITY): Payer: Medicare HMO | Admitting: Physical Therapy

## 2021-11-23 DIAGNOSIS — I890 Lymphedema, not elsewhere classified: Secondary | ICD-10-CM

## 2021-11-23 NOTE — Therapy (Addendum)
? ? ? ?Patient Name: [name] ?MRN: [id number] ?DOB:Jul 27, 1940, 81 y.o., female ?Today's Date: 11/23/2021 ? ? PT End of Session - 11/23/21 1419   ? ? Visit Number 14   ? Number of Visits 18   ? Date for PT Re-Evaluation 06/28//23   ? Authorization Type humana requested 12   ? Authorization Time Period 12 visits from 4/4/-->11/18/21; [employer] paperwork complete requested 12 visits 5/8-->12/19/21   ? Authorization - Visit Number 2   ? Authorization - Number of Visits 12   anticipating approval starting todays date.  ? Progress Note Due on Visit 18   ? PT Start Time 1415   ? PT Stop Time 1530   ? PT Time Calculation (min) 75 min   ? Activity Tolerance Patient tolerated treatment well   ? Behavior During Therapy [facility] for tasks assessed/performed   ? ?  ?  ? ?  ? ? ? ? ? ?Past Medical History:  ?Diagnosis Date  ? Allergy   ? Anemia   ? Atrial fibrillation ([place])   ? Cardiomyopathy, nonischemic ([place])   ? a. EF previously 35% in 2008 with cath showing no significant CAD b. EF improved to 55-60% in 02/2020  ? Cataract   ? Cerebrovascular accident [facility])   ? No clinical event; asymptomatic CT finding  ? CHF (congestive heart failure) ([place])   ? Chronic kidney disease   ? COPD (chronic obstructive pulmonary disease) ([place])   ? DDD (degenerative disc disease), cervical   ? Cervical  ? Degenerative joint disease   ? Right hip and knee  ? Essential hypertension   ? GERD (gastroesophageal reflux disease)   ? Hip dislocation, right ([place])   ? Hyperlipemia   ? Hypothyroid   ? LBBB (left bundle branch block)   ? Pacemaker   ? S/P colonoscopy 2009  ? Dr. Sharlett Iles: reportedly normal per pt  ? S/P endoscopy 1980s  ? ?Past Surgical History:  ?Procedure Laterality Date  ? ABDOMINAL HYSTERECTOMY    ? APPENDECTOMY    ? BI-VENTRICULAR IMPLANTABLE CARDIOVERTER DEFIBRILLATOR  (CRT-D)  10-31-2013  ? MDT Hillery Aldo CRTD implanted by Dr Lovena Le  ? BIOPSY  12/18/2018  ? Procedure: BIOPSY;  Surgeon: Daneil Dolin, MD;  [facility];  Service:  Endoscopy;;  Gastric ?  ? BIV PACEMAKER INSERTION CRT-P N/A 12/17/2020  ? Procedure: DOWNGRAD TO BIV PACEMAKER INSERTION CRT-P;  Surgeon: Evans Lance, MD;  Location: [place] CV LAB;  Service: Cardiovascular;  Laterality: N/A;  ? BREAST BIOPSY    ? bilaterally; benign  ? can not have MRI due to jaw surgery    ? CATARACT EXTRACTION W/PHACO Left 03/28/2021  ? Procedure: CATARACT EXTRACTION PHACO AND INTRAOCULAR LENS PLACEMENT (IOC);  Surgeon: Baruch Goldmann, MD;  Location: [facility] ORS;  Service: Ophthalmology;  Laterality: Left;  CDE  17.17  ? CATARACT EXTRACTION W/PHACO Right 04/11/2021  ? Procedure: CATARACT EXTRACTION PHACO AND INTRAOCULAR LENS PLACEMENT RIGHT EYE;  Surgeon: Baruch Goldmann, MD;  Location: [facility] ORS;  Service: Ophthalmology;  Laterality: Right;  right ?CDE=17.03  ? CHOLECYSTECTOMY    ? COLONOSCOPY  2009  ? Dr. Sharlett Iles: Normal  ? ESOPHAGOGASTRODUODENOSCOPY  07/13/2011  ? small hh, patchy gastric erythema with scarring deformity of antrum, chronic gastritis, bx benign with no H.Pylori. Procedure: ESOPHAGOGASTRODUODENOSCOPY (EGD);  Surgeon: Daneil Dolin, MD;  [facility];  Service: Endoscopy;  Laterality: N/A;  10:45  ? ESOPHAGOGASTRODUODENOSCOPY N/A 12/18/2018  ? Dr. Gala Romney: normal esophagus  s/p dilation, small hh, erythema in stomach with benign biopsy.   ? IMPLANTABLE CARDIOVERTER DEFIBRILLATOR IMPLANT N/A 10/31/2013  ? Procedure: IMPLANTABLE CARDIOVERTER DEFIBRILLATOR IMPLANT;  Surgeon: Evans Lance, MD;  Location: [facility] CATH LAB;  Service: Cardiovascular;  Laterality: N/A;  ? MALONEY DILATION N/A 12/18/2018  ? Procedure: MALONEY DILATION;  Surgeon: Daneil Dolin, MD;  [facility];  Service: Endoscopy;  Laterality: N/A;  ? MANDIBLE SURGERY    ? secondary to malocclusion  ? 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 Ureteral Surgery  1970  ? ?Patient Active Problem List  ? Diagnosis Date Noted  ? Vitamin D deficiency 07/14/2019  ? Esophageal dysphagia 12/10/2018  ? RUQ pain 09/17/2017  ? Right  flank pain 09/17/2017  ? Biventricular ICD (implantable cardioverter-defibrillator) in place 11/17/2013  ? Atrial fibrillation ([place]) 11/17/2013  ? Chronic systolic heart failure ([place]) 10/13/2013  ? Hyponatremia 03/04/2013  ? Constipation 09/13/2011  ? GERD 09/13/2010  ? Hypothyroidism 04/28/2009  ? Hyperlipidemia 04/28/2009  ? Hypertension 04/28/2009  ? COPD (chronic obstructive pulmonary disease) ([place]) 04/28/2009  ? LEFT BUNDLE BRANCH BLOCK 04/28/2009  ? DEGENERATIVE JOINT DISEASE 04/28/2009  ? ? ?PCP: Celene Squibb, MD ? ?REFERRING PROVIDER: Erma Heritage, PA-C ? ?REFERRING DIAG: lymphedema  ? ?THERAPY DIAG:  ?Lymphedema, not elsewhere classified ? ?ONSET DATE: chronic ? ?SUBJECTIVE                                                                                                                                                                                          ? ?SUBJECTIVE STATEMENT:  Pt states that she used her pump yesterday.  Her RT back is bothering her the most today.  She did much better without the foam; no itching.  ? ?PERTINENT HISTORY: Mary VIRDEN is a 81 y.o. female with past medical history of HFimpEF (EF previously 35% in 2008 with cath showing no significant CAD, at 55-60% in 02/2020), LBBB, s/p [employer] biventricular ICD placement, paroxysmal atrial fibrillation, HTN, GERD and DJD  ? ?PAIN:  ?Are you having pain? Yes needs surgery but is not a candidate.  ?NPRS scale: 10/10 ?Pain location: Lower back and Bil hip greater than 10/10 ?Pain orientation: Bilateral  ?PAIN TYPE: aching ?Pain description: constant  ?Aggravating factors: Wt bearing  ?Relieving factors: shots  ?This is not what we are seeing pt for therefore we will not address this in our treatment plan.   ? ?PRECAUTIONS: Other: cellulitis  ? ?WEIGHT BEARING RESTRICTIONS No ? ?FALLS:  ?Has patient fallen in last 6 months? No ? ?LIVING ENVIRONMENT: ?Lives with: lives with their family and lives with  their son ?Lives in:  House/apartment ?Stairs: No ?Has following equipment at home: Gilford Rile - 2 wheeled ? ?OCCUPATION: retired  ? ?LEISURE: would love to get back planting flowers  ? ?  ?PRIOR LEVEL OF FUNCTION: Independent with basic ADLs ? ?PATIENT GOALS less edema, to move better  ? ? ?OBJECTIVE ? ?COGNITION: ? Overall cognitive status: Within functional limits for tasks assessed  ? ?PALPATION: Increased induration posterior aspect of B LE  and thighs  ? ?OBSERVATIONS / OTHER ASSESSMENTS: weeping noted B, induration,  ?Paplliomas B  ? ? ?LYMPHEDEMA ASSESSMENTS:  ? ?INFECTIONS: no ? ?LYMPHEDEMA ASSESSMENTS:  ? ?LE LANDMARK RIGHT ?10/18/21 Right  ?11/02/21 Right  ?11/09/21 Right ?11/16/21 Right   ?At groin       ?30 cm proximal to suprapatella       ?20 cm proximal to suprapatella   70 66 67.5 65 67 ?  ?10 cm proximal to suprapatella   63 ? 59.6 61 59 62  ?At midpatella / popliteal crease   53.5 51.3  49.4 50 51.5  ?30 cm proximal to floor at lateral plantar foot   45.6 42  43.0 41.5 42  ?20 cm proximal to floor at lateral plantar foot   39.3 32.8  38 35.2 37.8  ?10 cm proximal to floor at lateral plantar foot   35 28.5  33 30 32.  ?Circumference of ankle/heel   38 33.5  36.8 34.8 37  ?5 cm proximal to 1st MTP joint   26 23.4  24 23.6 24.3  ?Across MTP joint   24.3 23.6 24.5 23.5 24  ?Around proximal great toe  9.3  8.5   ?Weight   11/11/21 235.2# 234.6#   ?(Blank rows = not tested) ? ?LE LANDMARK LEFT ?10/18/21 Left  ?11/02/21 Left  ?11/09/21 Left  ?11/16/21 LEft  ?11/23/2021  ?At groin       ?30 cm proximal to suprapatella       ?20 cm proximal to suprapatella   66.2 63.8 66 63.6 63.8  ?10 cm proximal to suprapatella   61.2 59 59.5 57.8 58  ?At midpatella / popliteal crease   52.1 47.4 48.7 4935 48.7  ?30 cm proximal to floor at lateral plantar foot   47 41.2 46.2 44.2 42.5  ?20 cm proximal to floor at lateral plantar foot   41.5 33.4 43 37.6 38.7  ?10 cm proximal to floor at lateral plantar foot   37.3 29 46.3 33.5 34.7  ?Circumference of  ankle/heel   39 33.6 37 35.5 35  ?5 cm proximal to 1st MTP joint   26.7 23.4 26.3 25.2 25.5  ?Across MTP joint  25 24.6 25.2 24.5 24.7  ?Around proximal great toe  8.4  8   ?(Blank rows = not tested) ? ? ? ? ? ?TODAY'S T

## 2021-11-24 DIAGNOSIS — H02834 Dermatochalasis of left upper eyelid: Secondary | ICD-10-CM | POA: Diagnosis not present

## 2021-11-24 DIAGNOSIS — H26493 Other secondary cataract, bilateral: Secondary | ICD-10-CM | POA: Diagnosis not present

## 2021-11-24 DIAGNOSIS — H35373 Puckering of macula, bilateral: Secondary | ICD-10-CM | POA: Diagnosis not present

## 2021-11-24 DIAGNOSIS — H02831 Dermatochalasis of right upper eyelid: Secondary | ICD-10-CM | POA: Diagnosis not present

## 2021-11-24 NOTE — Addendum Note (Signed)
Addended by: Leeroy Cha on: 11/24/2021 03:26 PM ? ? Modules accepted: Orders ? ?

## 2021-11-25 ENCOUNTER — Telehealth (HOSPITAL_COMMUNITY): Payer: Self-pay | Admitting: Physical Therapy

## 2021-11-25 ENCOUNTER — Ambulatory Visit (HOSPITAL_COMMUNITY): Payer: Medicare HMO | Admitting: Physical Therapy

## 2021-11-25 NOTE — Telephone Encounter (Signed)
She is sick throwing up and will not be here today ?

## 2021-11-28 DIAGNOSIS — R110 Nausea: Secondary | ICD-10-CM | POA: Diagnosis not present

## 2021-11-28 DIAGNOSIS — R1011 Right upper quadrant pain: Secondary | ICD-10-CM | POA: Diagnosis not present

## 2021-11-29 ENCOUNTER — Other Ambulatory Visit (HOSPITAL_COMMUNITY): Payer: Self-pay | Admitting: Family Medicine

## 2021-11-29 ENCOUNTER — Telehealth: Payer: Self-pay

## 2021-11-29 ENCOUNTER — Encounter (HOSPITAL_COMMUNITY): Payer: Medicare HMO

## 2021-11-29 DIAGNOSIS — R1011 Right upper quadrant pain: Secondary | ICD-10-CM

## 2021-11-29 DIAGNOSIS — I5023 Acute on chronic systolic (congestive) heart failure: Secondary | ICD-10-CM

## 2021-11-29 MED ORDER — FUROSEMIDE 40 MG PO TABS
40.0000 mg | ORAL_TABLET | Freq: Two times a day (BID) | ORAL | 6 refills | Status: DC
Start: 2021-11-29 — End: 2023-01-04

## 2021-11-29 NOTE — Telephone Encounter (Signed)
Medication refill request for Furosemide 40 mg tablets approved and sent to [facility].  ?

## 2021-12-02 ENCOUNTER — Ambulatory Visit (HOSPITAL_COMMUNITY): Payer: Medicare HMO | Admitting: Physical Therapy

## 2021-12-07 ENCOUNTER — Ambulatory Visit (HOSPITAL_COMMUNITY)
Admission: RE | Admit: 2021-12-07 | Discharge: 2021-12-07 | Disposition: A | Discharge status: Home / Self Care | Payer: Medicare HMO | Source: Ambulatory Visit | Attending: Family Medicine | Admitting: Family Medicine

## 2021-12-07 DIAGNOSIS — Z9049 Acquired absence of other specified parts of digestive tract: Secondary | ICD-10-CM | POA: Diagnosis not present

## 2021-12-07 DIAGNOSIS — R1011 Right upper quadrant pain: Secondary | ICD-10-CM | POA: Insufficient documentation

## 2021-12-08 ENCOUNTER — Ambulatory Visit: Payer: Medicare HMO | Admitting: Specialist

## 2021-12-08 ENCOUNTER — Encounter (HOSPITAL_COMMUNITY): Payer: Medicare HMO | Admitting: Physical Therapy

## 2021-12-09 ENCOUNTER — Encounter (HOSPITAL_COMMUNITY): Payer: Self-pay

## 2021-12-09 ENCOUNTER — Ambulatory Visit (HOSPITAL_COMMUNITY): Payer: Medicare HMO

## 2021-12-13 ENCOUNTER — Telehealth (HOSPITAL_COMMUNITY): Payer: Self-pay

## 2021-12-13 ENCOUNTER — Ambulatory Visit: Payer: Medicare HMO | Admitting: Internal Medicine

## 2021-12-13 ENCOUNTER — Encounter (HOSPITAL_COMMUNITY): Payer: Medicare HMO

## 2021-12-13 DIAGNOSIS — U071 COVID-19: Secondary | ICD-10-CM | POA: Diagnosis not present

## 2021-12-13 NOTE — Telephone Encounter (Signed)
No show, called and spoke to pt who stated she was out sick last week and got a call today informing she is covid positive.  Discussed attentendance and decision made to cancel remaining apts.  Pt stated she was supposed to make to apt today to get fitted for her hose, will cancel due to sickness.  Encouraged pt to ride pump daily.  Contract  MD if needs to return to therapy.  Ihor Austin, LPTA/CLT; Delana Meyer [phone]

## 2021-12-14 ENCOUNTER — Encounter (HOSPITAL_COMMUNITY): Payer: Medicare HMO | Admitting: Physical Therapy

## 2021-12-15 ENCOUNTER — Encounter (HOSPITAL_COMMUNITY): Payer: Medicare HMO | Admitting: Physical Therapy

## 2021-12-16 ENCOUNTER — Encounter (HOSPITAL_COMMUNITY): Payer: Medicare HMO

## 2021-12-20 DIAGNOSIS — I428 Other cardiomyopathies: Secondary | ICD-10-CM

## 2021-12-20 LAB — CUP PACEART REMOTE DEVICE CHECK
Battery Remaining Longevity: 113 mo
Battery Voltage: 3.02 V
Brady Statistic AP VP Percent: 63.83 %
Brady Statistic AP VS Percent: 0.34 %
Brady Statistic AS VP Percent: 30.49 %
Brady Statistic AS VS Percent: 5.34 %
Brady Statistic RA Percent Paced: 67.39 %
Brady Statistic RV Percent Paced: 81.8 %
Date Time Interrogation Session: 20230605221227
Implantable Lead Implant Date: 20150417
Implantable Lead Implant Date: 20150417
Implantable Lead Implant Date: 20150417
Implantable Lead Location: 753858
Implantable Lead Location: 753859
Implantable Lead Location: 753860
Implantable Lead Model: 4298
Implantable Lead Model: 5076
Implantable Lead Model: 6935
Implantable Pulse Generator Implant Date: 20220603
Lead Channel Impedance Value: 266 Ohm
Lead Channel Impedance Value: 266 Ohm
Lead Channel Impedance Value: 266 Ohm
Lead Channel Impedance Value: 285 Ohm
Lead Channel Impedance Value: 285 Ohm
Lead Channel Impedance Value: 304 Ohm
Lead Channel Impedance Value: 323 Ohm
Lead Channel Impedance Value: 342 Ohm
Lead Channel Impedance Value: 399 Ohm
Lead Channel Impedance Value: 437 Ohm
Lead Channel Impedance Value: 437 Ohm
Lead Channel Impedance Value: 475 Ohm
Lead Channel Impedance Value: 494 Ohm
Lead Channel Impedance Value: 494 Ohm
Lead Channel Pacing Threshold Amplitude: 0.5 V
Lead Channel Pacing Threshold Amplitude: 0.875 V
Lead Channel Pacing Threshold Amplitude: 1.25 V
Lead Channel Pacing Threshold Pulse Width: 0.4 ms
Lead Channel Pacing Threshold Pulse Width: 0.4 ms
Lead Channel Pacing Threshold Pulse Width: 0.6 ms
Lead Channel Sensing Intrinsic Amplitude: 1.875 mV
Lead Channel Sensing Intrinsic Amplitude: 1.875 mV
Lead Channel Sensing Intrinsic Amplitude: 13.875 mV
Lead Channel Sensing Intrinsic Amplitude: 13.875 mV
Lead Channel Setting Pacing Amplitude: 1.5 V
Lead Channel Setting Pacing Amplitude: 1.75 V
Lead Channel Setting Pacing Amplitude: 2 V
Lead Channel Setting Pacing Pulse Width: 0.4 ms
Lead Channel Setting Pacing Pulse Width: 0.6 ms
Lead Channel Setting Sensing Sensitivity: 0.9 mV

## 2021-12-21 ENCOUNTER — Encounter (HOSPITAL_COMMUNITY): Payer: Medicare HMO | Admitting: Physical Therapy

## 2021-12-23 ENCOUNTER — Encounter (HOSPITAL_COMMUNITY): Payer: Medicare HMO

## 2021-12-28 ENCOUNTER — Encounter (HOSPITAL_COMMUNITY): Payer: Medicare HMO

## 2021-12-30 ENCOUNTER — Encounter (HOSPITAL_COMMUNITY): Payer: Medicare HMO | Admitting: Physical Therapy

## 2022-01-02 ENCOUNTER — Encounter (HOSPITAL_COMMUNITY): Payer: Medicare HMO | Admitting: Physical Therapy

## 2022-01-03 ENCOUNTER — Ambulatory Visit: Payer: Medicare HMO | Admitting: Internal Medicine

## 2022-01-03 ENCOUNTER — Encounter: Payer: Self-pay | Admitting: Internal Medicine

## 2022-01-03 VITALS — BP 164/70 | HR 68 | Temp 98.0°F | Ht 64.0 in | Wt 235.6 lb

## 2022-01-03 DIAGNOSIS — K219 Gastro-esophageal reflux disease without esophagitis: Secondary | ICD-10-CM | POA: Diagnosis not present

## 2022-01-03 DIAGNOSIS — R1319 Other dysphagia: Secondary | ICD-10-CM | POA: Diagnosis not present

## 2022-01-03 NOTE — Patient Instructions (Signed)
It was good to see you again today!  You should continue taking your Prevacid or lansoprazole 30 mg capsule every day before breakfast indefinitely  I also suggest you take the Zofran or ondansetron 4 mg in the morning and then a second dose later in the day before supper-2 times daily to help prevent nausea  We will plan to see you back in 9 months and as needed.

## 2022-01-03 NOTE — Progress Notes (Unsigned)
Primary Care Physician:  Celene Squibb, MD Primary Gastroenterologist:  Dr. Gala Romney  Pre-Procedure History & Physical: HPI:  [name] is a 81 y.o. female here for follow-up of GERD and dysphagia.  Now on lansoprazole 30 mg daily which is controlling her reflux symptoms nicely.  States dysphagia is pretty much settled down.  She does have nausea intermittently throughout the day particularly after she eats only last for about 30 minutes no associated abdominal pain.  She is having major issues with lymphedema which is being managed by the cardiologist.  She continues to be anticoagulated for atrial fibrillation.  She has a pacemaker only.  Her defibrillator has been removed.  Past Medical History:  Diagnosis Date   Allergy    Anemia    Atrial fibrillation ([place])    Cardiomyopathy, nonischemic ([place])    a. EF previously 35% in 2008 with cath showing no significant CAD b. EF improved to 55-60% in 02/2020   Cataract    Cerebrovascular accident [facility])    No clinical event; asymptomatic CT finding   CHF (congestive heart failure) ([place])    Chronic kidney disease    COPD (chronic obstructive pulmonary disease) ([facility])    DDD (degenerative disc disease), cervical    Cervical   Degenerative joint disease    Right hip and knee   Essential hypertension    GERD (gastroesophageal reflux disease)    Hip dislocation, right ([facility])    Hyperlipemia    Hypothyroid    LBBB (left bundle branch block)    Pacemaker    S/P colonoscopy 2009   Dr. Sharlett Iles: reportedly normal per pt   S/P endoscopy 1980s    Past Surgical History:  Procedure Laterality Date   ABDOMINAL HYSTERECTOMY     APPENDECTOMY     BI-VENTRICULAR IMPLANTABLE CARDIOVERTER DEFIBRILLATOR  (CRT-D)  10-31-2013   MDT Hillery Aldo CRTD implanted by Dr Lovena Le   BIOPSY  12/18/2018   Procedure: BIOPSY;  Surgeon: Daneil Dolin, MD;  [facility];  Service: Endoscopy;;  Gastric    BIV PACEMAKER INSERTION CRT-P N/A 12/17/2020    Procedure: DOWNGRAD TO BIV PACEMAKER INSERTION CRT-P;  Surgeon: Evans Lance, MD;  Location: [place] CV LAB;  Service: Cardiovascular;  Laterality: N/A;   BREAST BIOPSY     bilaterally; benign   can not have MRI due to jaw surgery     CATARACT EXTRACTION W/PHACO Left 03/28/2021   Procedure: CATARACT EXTRACTION PHACO AND INTRAOCULAR LENS PLACEMENT ([place]);  Surgeon: Baruch Goldmann, MD;  Location: [facility] ORS;  Service: Ophthalmology;  Laterality: Left;  CDE  17.17   CATARACT EXTRACTION W/PHACO Right 04/11/2021   Procedure: CATARACT EXTRACTION PHACO AND INTRAOCULAR LENS PLACEMENT RIGHT EYE;  Surgeon: Baruch Goldmann, MD;  Location: [facility] ORS;  Service: Ophthalmology;  Laterality: Right;  right CDE=17.03   CHOLECYSTECTOMY     COLONOSCOPY  2009   Dr. Sharlett Iles: Normal   ESOPHAGOGASTRODUODENOSCOPY  07/13/2011   small hh, patchy gastric erythema with scarring deformity of antrum, chronic gastritis, bx benign with no H.Pylori. Procedure: ESOPHAGOGASTRODUODENOSCOPY (EGD);  Surgeon: Daneil Dolin, MD;  [facility];  Service: Endoscopy;  Laterality: N/A;  10:45   ESOPHAGOGASTRODUODENOSCOPY N/A 12/18/2018   Dr. Gala Romney: normal esophagus s/p dilation, small hh, erythema in stomach with benign biopsy.    IMPLANTABLE CARDIOVERTER DEFIBRILLATOR IMPLANT N/A 10/31/2013   Procedure: IMPLANTABLE CARDIOVERTER DEFIBRILLATOR IMPLANT;  Surgeon: Evans Lance, MD;  Location: [facility] CATH LAB;  Service: Cardiovascular;  Laterality:  N/A;   Venia Minks DILATION N/A 12/18/2018   Procedure: Venia Minks DILATION;  Surgeon: Daneil Dolin, MD;  [facility];  Service: Endoscopy;  Laterality: N/A;   MANDIBLE SURGERY     secondary to malocclusion   THYROIDECTOMY     neoplasm   Ureteral Surgery  1970    Prior to Admission medications   Medication Sig Start Date End Date Taking? Authorizing Provider  benazepril (LOTENSIN) 40 MG tablet Take 1 tablet by mouth once daily 11/15/21  Yes Satira Sark, MD  carvedilol  (COREG) 25 MG tablet Take 1.5 tablets (37.5 mg total) by mouth 2 (two) times daily with a meal. 03/23/21  Yes Evans Lance, MD  cholecalciferol (VITAMIN D) 1000 UNITS tablet Take 1,000 Units by mouth daily.   Yes [provider]  fexofenadine (ALLEGRA) 180 MG tablet Take 180 mg by mouth every morning.   Yes [provider]  fluticasone (FLONASE) 50 MCG/ACT nasal spray Place 2 sprays into both nostrils daily as needed for allergies. 08/11/19  Yes Corum, Rex Kras, MD  furosemide (LASIX) 40 MG tablet Take 1 tablet (40 mg total) by mouth 2 (two) times daily. as directed 11/29/21  Yes Satira Sark, MD  hydrALAZINE (APRESOLINE) 25 MG tablet TAKE 1 TABLET BY MOUTH THREE TIMES DAILY 09/09/21  Yes Satira Sark, MD  lansoprazole (PREVACID) 30 MG capsule Take 1 capsule (30 mg total) by mouth daily before breakfast. Office visit required for further refills. 11/18/21  Yes Mahala Menghini, PA-C  ondansetron (ZOFRAN) 4 MG tablet Take 1 tablet (4 mg total) by mouth every 8 (eight) hours as needed for nausea or vomiting. 10/15/19  Yes Corum, Rex Kras, MD  rivaroxaban (XARELTO) 20 MG TABS tablet TAKE 1 TABLET BY MOUTH ONCE DAILY WITH SUPPER 08/08/21  Yes Evans Lance, MD  SYNTHROID 125 MCG tablet Take 1 tablet by mouth once daily Patient taking differently: Take 125 mcg by mouth daily before breakfast. 11/06/19  Yes Corum, Rex Kras, MD  traMADol (ULTRAM) 50 MG tablet Take 1 tablet (50 mg total) by mouth every 6 (six) hours as needed for moderate pain. Patient taking differently: Take 50 mg by mouth every 12 (twelve) hours as needed for moderate pain or severe pain. 10/28/19  Yes Jessy Oto, MD  triamcinolone cream (KENALOG) 0.1 % Apply 1 application topically daily as needed (Breakout on legs).   Yes [provider]    Allergies as of 01/03/2022 - Review Complete 01/03/2022  Allergen Reaction Noted   Ciprofloxacin Cough 10/17/2013   Dexlansoprazole  09/13/2011   Statins Other (See  Comments) 05/30/2012   Amoxicillin Rash 07/14/2019    Family History  Problem Relation Age of Onset   Brain cancer Mother    Heart disease Mother    Hypertension Mother    Stroke Mother    Atrial fibrillation Other    Stroke Brother    Colon cancer Neg Hx     Social History   Socioeconomic History   Marital status: Widowed    Spouse name: Not on file   Number of children: 2   Years of education: Not on file   Highest education level: Not on file  Occupational History   Occupation: retired  Tobacco Use   Smoking status: Never   Smokeless tobacco: Never  Vaping Use   Vaping Use: Never used  Substance and Sexual Activity   Alcohol use: No    Alcohol/week: 0.0 standard drinks of alcohol  Drug use: No   Sexual activity: Not Currently  Other Topics Concern   Not on file  Social History Narrative   Widowed         Social Determinants of Health   Financial Resource Strain: Not on file  Food Insecurity: Not on file  Transportation Needs: Not on file  Physical Activity: Not on file  Stress: Not on file  Social Connections: Not on file  Intimate Partner Violence: Not on file    Review of Systems: See HPI, otherwise negative ROS  Physical Exam: BP (!) 164/70 (BP Location: Right Arm, Patient Position: Sitting, Cuff Size: Normal)   Pulse 68   Temp 98 F (36.7 C) (Temporal)   Ht '5\' 4"'$  (1.626 m)   Wt 235 lb 9.6 oz (106.9 kg)   SpO2 94%   BMI 40.44 kg/m  General:   Alert,  Well-developed, well-nourished, pleasant and cooperative in NAD Abdomen: Non-distended, normal bowel sounds.  Soft and nontender without appreciable mass or hepatosplenomegaly.  Extremities: Brawny lower extremity edema bilaterally    Impression/Plan: 81 year old lady with GERD history of dysphagia.  An element of esophageal dysmotility not excluded at this time.  Fortunately, she is doing well clinically from a clinical standpoint.  Reflux well controlled on lansoprazole 30 mg  daily. Intermittent nausea nonspecific.  No associated abdominal pain or actual emesis. At this time, no further diagnostic studies warranted.  Recommendations:  You should continue taking your Prevacid or lansoprazole 30 mg capsule every day before breakfast indefinitely  I also suggest you take the Zofran or ondansetron 4 mg in the morning and then a second dose later in the day before supper-2 times daily to help prevent nausea  We will plan to see you back in 9 months and as needed.       Notice: This dictation was prepared with Dragon dictation along with smaller phrase technology. Any transcriptional errors that result from this process are unintentional and may not be corrected upon review.

## 2022-01-04 ENCOUNTER — Encounter (HOSPITAL_COMMUNITY): Payer: Medicare HMO | Admitting: Physical Therapy

## 2022-01-04 ENCOUNTER — Ambulatory Visit: Payer: Medicare HMO | Admitting: Specialist

## 2022-01-04 NOTE — Progress Notes (Unsigned)
Remote pacemaker transmission.   

## 2022-01-06 ENCOUNTER — Encounter (HOSPITAL_COMMUNITY): Payer: Medicare HMO

## 2022-01-11 ENCOUNTER — Encounter (HOSPITAL_COMMUNITY): Payer: Medicare HMO

## 2022-01-13 ENCOUNTER — Encounter (HOSPITAL_COMMUNITY): Payer: Medicare HMO

## 2022-01-16 ENCOUNTER — Encounter (HOSPITAL_COMMUNITY): Payer: Medicare HMO | Admitting: Physical Therapy

## 2022-01-18 ENCOUNTER — Encounter (HOSPITAL_COMMUNITY): Payer: Medicare HMO | Admitting: Physical Therapy

## 2022-01-20 ENCOUNTER — Encounter (HOSPITAL_COMMUNITY): Payer: Medicare HMO

## 2022-01-20 DIAGNOSIS — E559 Vitamin D deficiency, unspecified: Secondary | ICD-10-CM | POA: Diagnosis not present

## 2022-01-20 DIAGNOSIS — R7303 Prediabetes: Secondary | ICD-10-CM | POA: Diagnosis not present

## 2022-01-20 DIAGNOSIS — E782 Mixed hyperlipidemia: Secondary | ICD-10-CM | POA: Diagnosis not present

## 2022-01-23 ENCOUNTER — Encounter (HOSPITAL_COMMUNITY): Payer: Medicare HMO | Admitting: Physical Therapy

## 2022-01-24 DIAGNOSIS — G894 Chronic pain syndrome: Secondary | ICD-10-CM | POA: Diagnosis not present

## 2022-01-24 DIAGNOSIS — I10 Essential (primary) hypertension: Secondary | ICD-10-CM | POA: Diagnosis not present

## 2022-01-24 DIAGNOSIS — I482 Chronic atrial fibrillation, unspecified: Secondary | ICD-10-CM | POA: Diagnosis not present

## 2022-01-24 DIAGNOSIS — E782 Mixed hyperlipidemia: Secondary | ICD-10-CM | POA: Diagnosis not present

## 2022-01-24 DIAGNOSIS — I429 Cardiomyopathy, unspecified: Secondary | ICD-10-CM | POA: Diagnosis not present

## 2022-01-24 DIAGNOSIS — R7303 Prediabetes: Secondary | ICD-10-CM | POA: Diagnosis not present

## 2022-01-24 DIAGNOSIS — I5022 Chronic systolic (congestive) heart failure: Secondary | ICD-10-CM | POA: Diagnosis not present

## 2022-01-24 DIAGNOSIS — E039 Hypothyroidism, unspecified: Secondary | ICD-10-CM | POA: Diagnosis not present

## 2022-01-24 DIAGNOSIS — N1831 Chronic kidney disease, stage 3a: Secondary | ICD-10-CM | POA: Diagnosis not present

## 2022-01-25 ENCOUNTER — Encounter (HOSPITAL_COMMUNITY): Payer: Medicare HMO | Admitting: Physical Therapy

## 2022-01-27 ENCOUNTER — Encounter (HOSPITAL_COMMUNITY): Payer: Medicare HMO | Admitting: Physical Therapy

## 2022-01-30 ENCOUNTER — Encounter (HOSPITAL_COMMUNITY): Payer: Medicare HMO | Admitting: Physical Therapy

## 2022-02-01 ENCOUNTER — Other Ambulatory Visit: Payer: Self-pay | Admitting: Gastroenterology

## 2022-02-01 ENCOUNTER — Encounter (HOSPITAL_COMMUNITY): Payer: Medicare HMO | Admitting: Physical Therapy

## 2022-02-01 ENCOUNTER — Encounter: Payer: Self-pay | Admitting: Specialist

## 2022-02-01 ENCOUNTER — Ambulatory Visit (INDEPENDENT_AMBULATORY_CARE_PROVIDER_SITE_OTHER): Payer: Medicare HMO | Admitting: Specialist

## 2022-02-01 VITALS — BP 171/87 | HR 81 | Ht 67.0 in | Wt 235.0 lb

## 2022-02-01 DIAGNOSIS — M7062 Trochanteric bursitis, left hip: Secondary | ICD-10-CM | POA: Diagnosis not present

## 2022-02-01 DIAGNOSIS — M25552 Pain in left hip: Secondary | ICD-10-CM

## 2022-02-01 DIAGNOSIS — M48062 Spinal stenosis, lumbar region with neurogenic claudication: Secondary | ICD-10-CM | POA: Diagnosis not present

## 2022-02-01 DIAGNOSIS — R29818 Other symptoms and signs involving the nervous system: Secondary | ICD-10-CM | POA: Diagnosis not present

## 2022-02-01 DIAGNOSIS — M25551 Pain in right hip: Secondary | ICD-10-CM

## 2022-02-01 DIAGNOSIS — M5136 Other intervertebral disc degeneration, lumbar region: Secondary | ICD-10-CM

## 2022-02-01 MED ORDER — METHYLPREDNISOLONE 4 MG PO TBPK: ORAL_TABLET | ORAL | 0 refills | Status: DC

## 2022-02-01 NOTE — Patient Instructions (Addendum)
  Plan: Avoid bending, stooping and avoid lifting weights greater than 10 lbs. Avoid prolong standing and walking. Avoid frequent bending and stooping  No lifting greater than 10 lbs. May use ice or moist heat for pain. Weight loss is of benefit. Handicap license is approved. CT scan non contrast of lumbar spine to assess for spinal stenosis and assess fracture L1, pain is primarily L4 to S1  Follow-Up Instructions: No follow-ups on file.  Hot showers in the AM.  Injection with steroid may be of benefit. Hemp CBD capsules, [email or web address] 5,000-7,000 mg per bottle, 60 capsules per bottle, take one capsule twice a day. Cane in the left hand to use with left leg weight bearing. Follow-Up Instructions: No follow-ups on file.

## 2022-02-01 NOTE — Progress Notes (Signed)
Office Visit Note   Patient: [name]           Date of Birth: 10/17/1940           MRN: [id number] Visit Date: 02/01/2022              Requested by: Celene Squibb, MD [place],  [place] [address] PCP: Celene Squibb, MD   Assessment & Plan: Visit Diagnoses:  1. Spinal stenosis of lumbar region with neurogenic claudication   2. Trochanteric bursitis, left hip   3. Bilateral hip pain   4. Degenerative disc disease, lumbar   5. Neurogenic claudication     Plan: Avoid bending, stooping and avoid lifting weights greater than 10 lbs. Avoid prolong standing and walking. Avoid frequent bending and stooping  No lifting greater than 10 lbs. May use ice or moist heat for pain. Weight loss is of benefit. Handicap license is approved. CT scan non contrast of lumbar spine to assess for spinal stenosis and assess fracture L1, pain is primarily L4 to S1  Follow-Up Instructions: No follow-ups on file.  Hot showers in the AM.  Injection with steroid may be of benefit. Hemp CBD capsules, [email or web address] 5,000-7,000 mg per bottle, 60 capsules per bottle, take one capsule twice a day. Cane in the left hand to use with left leg weight bearing. Follow-Up Instructions: No follow-ups on file.   Orders:  Orders Placed This Encounter  Procedures   CT LUMBAR SPINE WO CONTRAST   No orders of the defined types were placed in this encounter.     Procedures: No procedures performed   Clinical Data: No additional findings.   Subjective: Chief Complaint  Patient presents with   Lower Back - Pain    HPI  Review of Systems   Objective: Vital Signs: BP (!) 171/87 (BP Location: Left Arm, Patient Position: Sitting, Cuff Size: Large)   Pulse 81   Ht '5\' 7"'$  (1.702 m)   Wt 235 lb (106.6 kg)   BMI 36.81 kg/m   Physical Exam  Ortho Exam  Specialty Comments:  No specialty comments available.  Imaging: No results found.   PMFS History: Patient Active Problem List    Diagnosis Date Noted   Vitamin D deficiency 07/14/2019   Esophageal dysphagia 12/10/2018   RUQ pain 09/17/2017   Right flank pain 09/17/2017   Biventricular ICD (implantable cardioverter-defibrillator) in place 11/17/2013   Atrial fibrillation ([place]) 19/14/7829   Chronic systolic heart failure ([place]) 10/13/2013   Hyponatremia 03/04/2013   Constipation 09/13/2011   GERD 09/13/2010   Hypothyroidism 04/28/2009   Hyperlipidemia 04/28/2009   Hypertension 04/28/2009   COPD (chronic obstructive pulmonary disease) ([place]) 04/28/2009   LEFT BUNDLE BRANCH BLOCK 04/28/2009   DEGENERATIVE JOINT DISEASE 04/28/2009   Past Medical History:  Diagnosis Date   Allergy    Anemia    Atrial fibrillation ([place])    Cardiomyopathy, nonischemic ([place])    a. EF previously 35% in 2008 with cath showing no significant CAD b. EF improved to 55-60% in 02/2020   Cataract    Cerebrovascular accident [facility])    No clinical event; asymptomatic CT finding   CHF (congestive heart failure) ([place])    Chronic kidney disease    COPD (chronic obstructive pulmonary disease) ([place])    DDD (degenerative disc disease), cervical    Cervical   Degenerative joint disease    Right hip and knee   Essential hypertension    GERD (gastroesophageal reflux  disease)    Hip dislocation, right ([facility])    Hyperlipemia    Hypothyroid    LBBB (left bundle branch block)    Pacemaker    S/P colonoscopy 2009   Dr. Sharlett Iles: reportedly normal per pt   S/P endoscopy 1980s    Family History  Problem Relation Age of Onset   Brain cancer Mother    Heart disease Mother    Hypertension Mother    Stroke Mother    Atrial fibrillation Other    Stroke Brother    Colon cancer Neg Hx     Past Surgical History:  Procedure Laterality Date   ABDOMINAL HYSTERECTOMY     APPENDECTOMY     BI-VENTRICULAR IMPLANTABLE CARDIOVERTER DEFIBRILLATOR  (CRT-D)  10-31-2013   MDT Hillery Aldo CRTD implanted by Dr Lovena Le   BIOPSY  12/18/2018   Procedure: BIOPSY;   Surgeon: Daneil Dolin, MD;  [facility];  Service: Endoscopy;;  Gastric    BIV PACEMAKER INSERTION CRT-P N/A 12/17/2020   Procedure: DOWNGRAD TO BIV PACEMAKER INSERTION CRT-P;  Surgeon: Evans Lance, MD;  Location: [place] CV LAB;  Service: Cardiovascular;  Laterality: N/A;   BREAST BIOPSY     bilaterally; benign   can not have MRI due to jaw surgery     CATARACT EXTRACTION W/PHACO Left 03/28/2021   Procedure: CATARACT EXTRACTION PHACO AND INTRAOCULAR LENS PLACEMENT ([place]);  Surgeon: Baruch Goldmann, MD;  Location: [facility];  Service: Ophthalmology;  Laterality: Left;  CDE  17.17   CATARACT EXTRACTION W/PHACO Right 04/11/2021   Procedure: CATARACT EXTRACTION PHACO AND INTRAOCULAR LENS PLACEMENT RIGHT EYE;  Surgeon: Baruch Goldmann, MD;  Location: [facility];  Service: Ophthalmology;  Laterality: Right;  right CDE=17.03   CHOLECYSTECTOMY     COLONOSCOPY  2009   Dr. Sharlett Iles: Normal   ESOPHAGOGASTRODUODENOSCOPY  07/13/2011   small hh, patchy gastric erythema with scarring deformity of antrum, chronic gastritis, bx benign with no H.Pylori. Procedure: ESOPHAGOGASTRODUODENOSCOPY (EGD);  Surgeon: Daneil Dolin, MD;  [facility];  Service: Endoscopy;  Laterality: N/A;  10:45   ESOPHAGOGASTRODUODENOSCOPY N/A 12/18/2018   Dr. Gala Romney: normal esophagus s/p dilation, small hh, erythema in stomach with benign biopsy.    IMPLANTABLE CARDIOVERTER DEFIBRILLATOR IMPLANT N/A 10/31/2013   Procedure: IMPLANTABLE CARDIOVERTER DEFIBRILLATOR IMPLANT;  Surgeon: Evans Lance, MD;  Location: [facility] CATH LAB;  Service: Cardiovascular;  Laterality: N/A;   MALONEY DILATION N/A 12/18/2018   Procedure: Venia Minks DILATION;  Surgeon: Daneil Dolin, MD;  [facility];  Service: Endoscopy;  Laterality: N/A;   MANDIBLE SURGERY     secondary to malocclusion   THYROIDECTOMY     neoplasm   Ureteral Surgery  1970   Social History   Occupational History   Occupation: retired  Tobacco Use   Smoking  status: Never   Smokeless tobacco: Never  Vaping Use   Vaping Use: Never used  Substance and Sexual Activity   Alcohol use: No    Alcohol/week: 0.0 standard drinks of alcohol   Drug use: No   Sexual activity: Not Currently

## 2022-02-03 ENCOUNTER — Encounter (HOSPITAL_COMMUNITY): Payer: Medicare HMO

## 2022-02-06 ENCOUNTER — Other Ambulatory Visit: Payer: Self-pay | Admitting: Internal Medicine

## 2022-02-06 NOTE — Telephone Encounter (Signed)
Prescription refill request for Xarelto received.  Indication: PAF Last office visit: 08/25/21  B Strader PA-C Weight: 114.8kg Age: 81 Scr: 1.11 on 06/17/21 CrCl: 72.04  Based on above findings Xarelto '20mg'$  daily is the appropriate dose.  Refill approved.

## 2022-02-13 ENCOUNTER — Ambulatory Visit (HOSPITAL_COMMUNITY)
Admission: RE | Admit: 2022-02-13 | Discharge: 2022-02-13 | Disposition: A | Discharge status: Home / Self Care | Payer: Medicare HMO | Source: Ambulatory Visit | Attending: Specialist | Admitting: Specialist

## 2022-02-13 DIAGNOSIS — M48062 Spinal stenosis, lumbar region with neurogenic claudication: Secondary | ICD-10-CM | POA: Diagnosis not present

## 2022-02-13 DIAGNOSIS — H04123 Dry eye syndrome of bilateral lacrimal glands: Secondary | ICD-10-CM | POA: Diagnosis not present

## 2022-02-13 DIAGNOSIS — R29818 Other symptoms and signs involving the nervous system: Secondary | ICD-10-CM | POA: Diagnosis not present

## 2022-02-13 DIAGNOSIS — M545 Low back pain, unspecified: Secondary | ICD-10-CM | POA: Diagnosis not present

## 2022-02-23 ENCOUNTER — Ambulatory Visit: Payer: Medicare HMO | Admitting: Specialist

## 2022-02-23 ENCOUNTER — Encounter: Payer: Self-pay | Admitting: Specialist

## 2022-02-23 VITALS — BP 144/62 | HR 77 | Ht 67.0 in | Wt 235.0 lb

## 2022-02-23 DIAGNOSIS — M48062 Spinal stenosis, lumbar region with neurogenic claudication: Secondary | ICD-10-CM | POA: Diagnosis not present

## 2022-02-23 DIAGNOSIS — M7062 Trochanteric bursitis, left hip: Secondary | ICD-10-CM | POA: Diagnosis not present

## 2022-02-23 DIAGNOSIS — M25551 Pain in right hip: Secondary | ICD-10-CM | POA: Diagnosis not present

## 2022-02-23 DIAGNOSIS — S32010A Wedge compression fracture of first lumbar vertebra, initial encounter for closed fracture: Secondary | ICD-10-CM | POA: Diagnosis not present

## 2022-02-23 DIAGNOSIS — M25552 Pain in left hip: Secondary | ICD-10-CM | POA: Diagnosis not present

## 2022-02-23 DIAGNOSIS — R609 Edema, unspecified: Secondary | ICD-10-CM | POA: Diagnosis not present

## 2022-02-23 NOTE — Patient Instructions (Addendum)
Plan: Avoid frequent bending and stooping  No lifting greater than 10 lbs. May use ice or moist heat for pain. Weight loss is of benefit. Best medication for lumbar disc disease is arthritis medications but you are unable to take due to anticoagulation. Exercise is important to improve your indurance and does allow people to function better inspite of back pain.  Avoid frequent bending and stooping  No lifting greater than 10 lbs. May use ice or moist heat for pain. Weight loss is of benefit. Referral to interventional radiology for kyphoplasty at L1 due to central compression fracture with increasing kyphosis.

## 2022-02-23 NOTE — Progress Notes (Addendum)
Office Visit Note   Patient: [name]           Date of Birth: [birth year]           MRN: [id number] Visit Date: 02/23/2022              Requested by: Celene Squibb, MD [place],  [place] [address] PCP: Celene Squibb, MD   Assessment & Plan: Visit Diagnoses:  1. Spinal stenosis of lumbar region with neurogenic claudication   2. Trochanteric bursitis, left hip   3. Bilateral hip pain   4. Edema, pitting     Plan: Plan: Avoid frequent bending and stooping  No lifting greater than 10 lbs. May use ice or moist heat for pain. Weight loss is of benefit. Best medication for lumbar disc disease is arthritis medications but you are unable to take due to anticoagulation. Exercise is important to improve your indurance and does allow people to function better inspite of back pain.  Avoid frequent bending and stooping  No lifting greater than 10 lbs. May use ice or moist heat for pain. Weight loss is of benefit. Referral to interventional radiology for kyphoplasty at L1 due to central compression fracture with increasing kyphosis.    Follow-Up Instructions: No follow-ups on file.   Orders:  No orders of the defined types were placed in this encounter.  No orders of the defined types were placed in this encounter.     Procedures: No procedures performed   Clinical Data: No additional findings.   Subjective: Chief Complaint  Patient presents with   Lower Back - Follow-up    CT Scan Review    HPI  Review of Systems   Objective: Vital Signs: BP (!) 144/62 (BP Location: Left Arm, Patient Position: Sitting)   Pulse 77   Ht '5\' 7"'$  (1.702 m)   Wt 235 lb (106.6 kg)   BMI 36.81 kg/m   Physical Exam  Ortho Exam  Specialty Comments:  No specialty comments available.  Imaging: No results found.   PMFS History: Patient Active Problem List   Diagnosis Date Noted   Vitamin D deficiency 07/14/2019   Esophageal dysphagia 12/10/2018   RUQ pain  09/17/2017   Right flank pain 09/17/2017   Biventricular ICD (implantable cardioverter-defibrillator) in place 11/17/2013   Atrial fibrillation ([place]) 71/69/6789   Chronic systolic heart failure ([place]) 10/13/2013   Hyponatremia 03/04/2013   Constipation 09/13/2011   GERD 09/13/2010   Hypothyroidism 04/28/2009   Hyperlipidemia 04/28/2009   Hypertension 04/28/2009   COPD (chronic obstructive pulmonary disease) ([place]) 04/28/2009   LEFT BUNDLE BRANCH BLOCK 04/28/2009   DEGENERATIVE JOINT DISEASE 04/28/2009   Past Medical History:  Diagnosis Date   Allergy    Anemia    Atrial fibrillation ([place])    Cardiomyopathy, nonischemic ([place])    a. EF previously 35% in 2008 with cath showing no significant CAD b. EF improved to 55-60% in 02/2020   Cataract    Cerebrovascular accident [facility])    No clinical event; asymptomatic CT finding   CHF (congestive heart failure) ([place])    Chronic kidney disease    COPD (chronic obstructive pulmonary disease) ([place])    DDD (degenerative disc disease), cervical    Cervical   Degenerative joint disease    Right hip and knee   Essential hypertension    GERD (gastroesophageal reflux disease)    Hip dislocation, right ([place])    Hyperlipemia    Hypothyroid  LBBB (left bundle branch block)    Pacemaker    S/P colonoscopy 2009   Dr. Sharlett Iles: reportedly normal per pt   S/P endoscopy 1980s    Family History  Problem Relation Age of Onset   Brain cancer Mother    Heart disease Mother    Hypertension Mother    Stroke Mother    Atrial fibrillation Other    Stroke Brother    Colon cancer Neg Hx     Past Surgical History:  Procedure Laterality Date   ABDOMINAL HYSTERECTOMY     APPENDECTOMY     BI-VENTRICULAR IMPLANTABLE CARDIOVERTER DEFIBRILLATOR  (CRT-D)  10-31-2013   MDT Hillery Aldo CRTD implanted by Dr Lovena Le   BIOPSY  12/18/2018   Procedure: BIOPSY;  Surgeon: Daneil Dolin, MD;  [facility];  Service: Endoscopy;;  Gastric    BIV PACEMAKER  INSERTION CRT-P N/A 12/17/2020   Procedure: DOWNGRAD TO BIV PACEMAKER INSERTION CRT-P;  Surgeon: Evans Lance, MD;  Location: [place] CV LAB;  Service: Cardiovascular;  Laterality: N/A;   BREAST BIOPSY     bilaterally; benign   can not have MRI due to jaw surgery     CATARACT EXTRACTION W/PHACO Left 03/28/2021   Procedure: CATARACT EXTRACTION PHACO AND INTRAOCULAR LENS PLACEMENT ([place]);  Surgeon: Baruch Goldmann, MD;  [facility];  Service: Ophthalmology;  Laterality: Left;  CDE  17.17   CATARACT EXTRACTION W/PHACO Right 04/11/2021   Procedure: CATARACT EXTRACTION PHACO AND INTRAOCULAR LENS PLACEMENT RIGHT EYE;  Surgeon: Baruch Goldmann, MD;  Location[facility];  Service: Ophthalmology;  Laterality: Right;  right CDE=17.03   CHOLECYSTECTOMY     COLONOSCOPY  2009   Dr. Sharlett Iles: Normal   ESOPHAGOGASTRODUODENOSCOPY  07/13/2011   small hh, patchy gastric erythema with scarring deformity of antrum, chronic gastritis, bx benign with no H.Pylori. Procedure: ESOPHAGOGASTRODUODENOSCOPY (EGD);  Surgeon: Daneil Dolin, MD;  [facility];  Service: Endoscopy;  Laterality: N/A;  10:45   ESOPHAGOGASTRODUODENOSCOPY N/A 12/18/2018   Dr. Gala Romney: normal esophagus s/p dilation, small hh, erythema in stomach with benign biopsy.    IMPLANTABLE CARDIOVERTER DEFIBRILLATOR IMPLANT N/A 10/31/2013   Procedure: IMPLANTABLE CARDIOVERTER DEFIBRILLATOR IMPLANT;  Surgeon: Evans Lance, MD;  Location: [facility] CATH LAB;  Service: Cardiovascular;  Laterality: N/A;   MALONEY DILATION N/A 12/18/2018   Procedure: Venia Minks DILATION;  Surgeon: Daneil Dolin, MD;  [facility];  Service: Endoscopy;  Laterality: N/A;   MANDIBLE SURGERY     secondary to malocclusion   THYROIDECTOMY     neoplasm   Ureteral Surgery  1970   Social History   Occupational History   Occupation: retired  Tobacco Use   Smoking status: Never   Smokeless tobacco: Never  Vaping Use   Vaping Use: Never used  Substance and Sexual  Activity   Alcohol use: No    Alcohol/week: 0.0 standard drinks of alcohol   Drug use: No   Sexual activity: Not Currently

## 2022-02-28 ENCOUNTER — Other Ambulatory Visit: Payer: Self-pay | Admitting: Specialist

## 2022-02-28 ENCOUNTER — Other Ambulatory Visit: Payer: Self-pay | Admitting: Cardiology

## 2022-02-28 ENCOUNTER — Ambulatory Visit (HOSPITAL_COMMUNITY): Admission: RE | Admit: 2022-02-28 | Payer: Medicare HMO | Source: Ambulatory Visit

## 2022-02-28 DIAGNOSIS — S32010G Wedge compression fracture of first lumbar vertebra, subsequent encounter for fracture with delayed healing: Secondary | ICD-10-CM

## 2022-03-02 ENCOUNTER — Telehealth: Payer: Self-pay | Admitting: Cardiology

## 2022-03-02 DIAGNOSIS — I700 Atherosclerosis of aorta: Secondary | ICD-10-CM | POA: Insufficient documentation

## 2022-03-02 NOTE — Telephone Encounter (Signed)
Patient with diagnosis of afib on Xarelto for anticoagulation.    Procedure: kyphoplasty Date of procedure: TBD  CHA2DS2-VASc Score = 8  This indicates a 10.8% annual risk of stroke. The patient's score is based upon: CHF History: 1 HTN History: 1 Diabetes History: 0 Stroke History: 2 Vascular Disease History: 1 Age Score: 2 Gender Score: 1   CrCl 17m/min using adjusted body weight due to obesity Platelet count 168K  Pt will require 3 day Xarelto hold prior to kyphoplasty. She is at elevated CV risk with CHADS2VASc score of 8 including prior CVA, this is documented as "asymptomatic CT finding." Will forward to MD for confirmation/approval of anticoag hold.  **This guidance is not considered finalized until pre-operative APP has relayed final recommendations.**

## 2022-03-02 NOTE — Telephone Encounter (Incomplete)
   Pre-operative Risk Assessment    Patient Name: [name]  DOB: 02-23-1941 MRN: [id number]{ }          Procedure:  Kyphoplasty { 2. When is this surgery scheduled? Press F2 to enter date below and place date in Reason for Visit (see directions below). :1} Date of Surgery:  Clearance TBD                            {  3. What is the name of the Surgeon, the Surgeon's Group or Practice, phone and fax number?  Press F2 and list below :1}  Surgeon:  not indicted  Surgeon's Group or Practice Name:  [employer] number:  [phone] Fax number:  [phone] { 4. What type of clearance is requested?  Medical or Cardiac Clearance only?  Pharmacy Clearance Only (Request is to hold medication only)?  Or Both?  Press F2 and select the clearance requested.  If both are needed, select both from the drop down list.     :1}  Type of Clearance Requested:   - Pharmacy:  Hold Rivaroxaban (Xarelto) 3 days  { 5. What type of anesthesia will be used?  Press F2 and select the anesthesia to be used for the procedure.  :1}  Type of Anesthesia:  Local  { 6. Are there any other requests or questions from the surgeon?    :1}  Additional requests/questions:  Sandrea Hammond   03/06/2022, 10:45 AM

## 2022-03-06 NOTE — Telephone Encounter (Signed)
Dr.Taylor to add pre-op note at 03/09/22 visit

## 2022-03-06 NOTE — Telephone Encounter (Addendum)
    Patient Name: [name]  DOB: [birth year] MRN: [id number]  Primary Cardiologist: Rozann Lesches, MD / EP - Dr. Cristopher Peru  Chart reviewed as part of pre-operative protocol coverage.   The patient already has an upcoming appointment scheduled 03/09/22 with Dr. Lovena Le at which time this clearance can be addressed.  - Dr. Lovena Le will also need to review pharmacist inquiry to include anticoag recs in his note.   - I added "preop" comment to appointment notes so that provider is aware to address at time of OV. Per office protocol, the provider seeing this patient should forward their finalized clearance decision to requesting party below.  - Will fax update to requesting surgeon so they are aware. Will remove from preop box as separate preop APP input not necessary at this time.  Charlie Pitter, PA-C 03/06/2022, 8:12 AM

## 2022-03-09 ENCOUNTER — Encounter: Payer: Self-pay | Admitting: Internal Medicine

## 2022-03-09 ENCOUNTER — Ambulatory Visit (INDEPENDENT_AMBULATORY_CARE_PROVIDER_SITE_OTHER): Payer: Medicare HMO | Admitting: Internal Medicine

## 2022-03-09 VITALS — BP 146/70 | HR 73 | Ht 67.0 in | Wt 236.4 lb

## 2022-03-09 DIAGNOSIS — I5022 Chronic systolic (congestive) heart failure: Secondary | ICD-10-CM

## 2022-03-09 LAB — CUP PACEART INCLINIC DEVICE CHECK
Battery Remaining Longevity: 109 mo
Battery Voltage: 3.01 V
Brady Statistic AP VP Percent: 66.64 %
Brady Statistic AP VS Percent: 0.34 %
Brady Statistic AS VP Percent: 25.02 %
Brady Statistic AS VS Percent: 8 %
Brady Statistic RA Percent Paced: 72.34 %
Brady Statistic RV Percent Paced: 81.43 %
Date Time Interrogation Session: 20230824140439
Implantable Lead Implant Date: 20150417
Implantable Lead Implant Date: 20150417
Implantable Lead Implant Date: 20150417
Implantable Lead Location: 753858
Implantable Lead Location: 753859
Implantable Lead Location: 753860
Implantable Lead Model: 4298
Implantable Lead Model: 5076
Implantable Lead Model: 6935
Implantable Pulse Generator Implant Date: 20220603
Lead Channel Impedance Value: 266 Ohm
Lead Channel Impedance Value: 266 Ohm
Lead Channel Impedance Value: 266 Ohm
Lead Channel Impedance Value: 266 Ohm
Lead Channel Impedance Value: 285 Ohm
Lead Channel Impedance Value: 304 Ohm
Lead Channel Impedance Value: 323 Ohm
Lead Channel Impedance Value: 323 Ohm
Lead Channel Impedance Value: 437 Ohm
Lead Channel Impedance Value: 456 Ohm
Lead Channel Impedance Value: 475 Ohm
Lead Channel Impedance Value: 494 Ohm
Lead Channel Impedance Value: 494 Ohm
Lead Channel Impedance Value: 532 Ohm
Lead Channel Pacing Threshold Amplitude: 0.625 V
Lead Channel Pacing Threshold Amplitude: 0.875 V
Lead Channel Pacing Threshold Amplitude: 1.5 V
Lead Channel Pacing Threshold Pulse Width: 0.4 ms
Lead Channel Pacing Threshold Pulse Width: 0.4 ms
Lead Channel Pacing Threshold Pulse Width: 0.6 ms
Lead Channel Sensing Intrinsic Amplitude: 1.5 mV
Lead Channel Sensing Intrinsic Amplitude: 1.875 mV
Lead Channel Sensing Intrinsic Amplitude: 10.625 mV
Lead Channel Sensing Intrinsic Amplitude: 11 mV
Lead Channel Setting Pacing Amplitude: 1.5 V
Lead Channel Setting Pacing Amplitude: 2 V
Lead Channel Setting Pacing Amplitude: 2 V
Lead Channel Setting Pacing Pulse Width: 0.4 ms
Lead Channel Setting Pacing Pulse Width: 0.6 ms
Lead Channel Setting Sensing Sensitivity: 0.9 mV

## 2022-03-09 NOTE — Progress Notes (Signed)
HPI [name] returns today for followup. She is a pleasant 81 yo woman with a h/o chronic systolic heart failure, obesity, and LBBB who underwent biv ICD insertion several years ago. She reached ERI and underwent gen change with downgrade to a BIV PPM. She denies chest pain. She has class 2 CHF symptoms. She has lymphedema. She denies non-compliance. She has disc problems in her back and is considering kyphoplasty. Allergies  Allergen Reactions   Ciprofloxacin Cough   Dexlansoprazole     Lower abdominal pain.   Statins Other (See Comments)    Arthralgias during treatment with at least 2 members of his class    Amoxicillin Rash     Current Outpatient Medications  Medication Sig Dispense Refill   benazepril (LOTENSIN) 40 MG tablet Take 1 tablet by mouth once daily 90 tablet 2   carvedilol (COREG) 25 MG tablet Take 1.5 tablets (37.5 mg total) by mouth 2 (two) times daily with a meal. 270 tablet 3   cholecalciferol (VITAMIN D) 1000 UNITS tablet Take 1,000 Units by mouth daily.     fexofenadine (ALLEGRA) 180 MG tablet Take 180 mg by mouth every morning.     fluticasone (FLONASE) 50 MCG/ACT nasal spray Place 2 sprays into both nostrils daily as needed for allergies. 16 g 0   furosemide (LASIX) 40 MG tablet Take 1 tablet (40 mg total) by mouth 2 (two) times daily. as directed 60 tablet 6   hydrALAZINE (APRESOLINE) 25 MG tablet TAKE 1 TABLET BY MOUTH THREE TIMES DAILY 270 tablet 3   lansoprazole (PREVACID) 30 MG capsule TAKE 1 CAPSULE BY MOUTH BEFORE BREAKFAST 90 capsule 1   ondansetron (ZOFRAN) 4 MG tablet Take 1 tablet (4 mg total) by mouth every 8 (eight) hours as needed for nausea or vomiting. 20 tablet 0   rivaroxaban (XARELTO) 20 MG TABS tablet TAKE 1 TABLET BY MOUTH ONCE DAILY WITH SUPPER 90 tablet 1   SYNTHROID 125 MCG tablet Take 1 tablet by mouth once daily (Patient taking differently: Take 125 mcg by mouth daily before breakfast.) 30 tablet 11   traMADol (ULTRAM) 50 MG tablet  Take 1 tablet (50 mg total) by mouth every 6 (six) hours as needed for moderate pain. (Patient taking differently: Take 50 mg by mouth every 12 (twelve) hours as needed for moderate pain or severe pain.) 30 tablet 0   methylPREDNISolone (MEDROL DOSEPAK) 4 MG TBPK tablet Take as directed 6 day dose pak. (Patient not taking: Reported on 03/09/2022) 21 tablet 0   triamcinolone cream (KENALOG) 0.1 % Apply 1 application topically daily as needed (Breakout on legs). (Patient not taking: Reported on 03/09/2022)     No current facility-administered medications for this visit.     Past Medical History:  Diagnosis Date   Allergy    Anemia    Atrial fibrillation ([place])    Cardiomyopathy, nonischemic ([place])    a. EF previously 35% in 2008 with cath showing no significant CAD b. EF improved to 55-60% in 02/2020   Cataract    Cerebrovascular accident [facility])    No clinical event; asymptomatic CT finding   CHF (congestive heart failure) ([place])    Chronic kidney disease    COPD (chronic obstructive pulmonary disease) ([place])    DDD (degenerative disc disease), cervical    Cervical   Degenerative joint disease    Right hip and knee   Essential hypertension    GERD (gastroesophageal reflux disease)    Hip dislocation, right (  [place])    Hyperlipemia    Hypothyroid    LBBB (left bundle branch block)    Pacemaker    S/P colonoscopy 2009   Dr. Sharlett Walton: reportedly normal per pt   S/P endoscopy 1980s    ROS:   All systems reviewed and negative except as noted in the HPI.   Past Surgical History:  Procedure Laterality Date   ABDOMINAL HYSTERECTOMY     APPENDECTOMY     BI-VENTRICULAR IMPLANTABLE CARDIOVERTER DEFIBRILLATOR  (CRT-D)  10-31-2013   MDT [name] CRTD implanted by Dr [name]   BIOPSY  12/18/2018   Procedure: BIOPSY;  Surgeon: Mary Dolin, MD;  [facility];  Service: Endoscopy;;  Gastric    BIV PACEMAKER INSERTION CRT-P N/A 12/17/2020   Procedure: DOWNGRAD TO BIV PACEMAKER  INSERTION CRT-P;  Surgeon: Mary Lance, MD;  Location: [place] CV LAB;  Service: Cardiovascular;  Laterality: N/A;   BREAST BIOPSY     bilaterally; benign   can not have MRI due to jaw surgery     CATARACT EXTRACTION W/PHACO Left 03/28/2021   Procedure: CATARACT EXTRACTION PHACO AND INTRAOCULAR LENS PLACEMENT ([place]);  Surgeon: Mary Goldmann, MD;  Location: [facility];  Service: Ophthalmology;  Laterality: Left;  CDE  17.17   CATARACT EXTRACTION W/PHACO Right 04/11/2021   Procedure: CATARACT EXTRACTION PHACO AND INTRAOCULAR LENS PLACEMENT RIGHT EYE;  Surgeon: Mary Goldmann, MD;  Location: [facility];  Service: Ophthalmology;  Laterality: Right;  right CDE=17.03   CHOLECYSTECTOMY     COLONOSCOPY  2009   Dr. Sharlett Walton: Normal   ESOPHAGOGASTRODUODENOSCOPY  07/13/2011   small hh, patchy gastric erythema with scarring deformity of antrum, chronic gastritis, bx benign with no H.Pylori. Procedure: ESOPHAGOGASTRODUODENOSCOPY (EGD);  Surgeon: Mary Dolin, MD;  [facility];  Service: Endoscopy;  Laterality: N/A;  10:45   ESOPHAGOGASTRODUODENOSCOPY N/A 12/18/2018   Dr. Gala Walton: normal esophagus s/p dilation, small hh, erythema in stomach with benign biopsy.    IMPLANTABLE CARDIOVERTER DEFIBRILLATOR IMPLANT N/A 10/31/2013   Procedure: IMPLANTABLE CARDIOVERTER DEFIBRILLATOR IMPLANT;  Surgeon: Mary Lance, MD;  Location: [facility] CATH LAB;  Service: Cardiovascular;  Laterality: N/A;   MALONEY DILATION N/A 12/18/2018   Procedure: [name] DILATION;  Surgeon: Mary Dolin, MD;  [facility];  Service: Endoscopy;  Laterality: N/A;   MANDIBLE SURGERY     secondary to malocclusion   THYROIDECTOMY     neoplasm   Ureteral Surgery  1970     Family History  Problem Relation Age of Onset   Brain cancer Mother    Heart disease Mother    Hypertension Mother    Stroke Mother    Atrial fibrillation Other    Stroke Brother    Colon cancer Neg Hx      Social History   Socioeconomic  History   Marital status: Widowed    Spouse name: Not on file   Number of children: 2   Years of education: Not on file   Highest education level: Not on file  Occupational History   Occupation: retired  Tobacco Use   Smoking status: Never   Smokeless tobacco: Never  Vaping Use   Vaping Use: Never used  Substance and Sexual Activity   Alcohol use: No    Alcohol/week: 0.0 standard drinks of alcohol   Drug use: No   Sexual activity: Not Currently  Other Topics Concern   Not on file  Social History Narrative   Widowed  Social Determinants of Health   Financial Resource Strain: Not on file  Food Insecurity: Not on file  Transportation Needs: Not on file  Physical Activity: Not on file  Stress: Not on file  Social Connections: Not on file  Intimate Partner Violence: Not on file     BP (!) 146/70   Pulse 73   Ht '5\' 7"'$  (1.702 m)   Wt 236 lb 6.4 oz (107.2 kg)   SpO2 96%   BMI 37.03 kg/m   Physical Exam:  Well appearing NAD HEENT: Unremarkable Neck:  No JVD, no thyromegally Lymphatics:  No adenopathy Back:  No CVA tenderness Lungs:  Clear HEART:  Regular rate rhythm, no murmurs, no rubs, no clicks Abd:  soft, positive bowel sounds, no organomegally, no rebound, no guarding Ext:  2 plus pulses, no edema, no cyanosis, no clubbing Skin:  No rashes no nodules Neuro:  CN II through XII intact, motor grossly intact  EKG  DEVICE  Normal device function.  See PaceArt for details.   Assess/Plan:   Preop eval - She may proceed with kyphoplasty. Ok to hold xarelto for 3 days. The half life of xarelto is 8 hours. Ok to restart xarelto when post injection risk of bleeding is acceptable. Chronic systolic heart failure - her symptoms remain class 2. We will follow.  Obesity - she is encouraged to maintain a low fat diet. HTN - her bp is a little elevated. She will continue her current meds. Ideally we could switch to [facility] but shes concerned about the price.     Carleene Overlie Armas Mcbee,MD

## 2022-03-09 NOTE — Patient Instructions (Signed)
Medication Instructions:  Your physician recommends that you continue on your current medications as directed. Please refer to the Current Medication list given to you today.  You have been given samples of Xarelto 20 mg. Lot # [id number], Exp: 10/24 *If you need a refill on your cardiac medications before your next appointment, please call your pharmacy*   Lab Work: NONE   If you have labs (blood work) drawn today and your tests are completely normal, you will receive your results only by: [place] (if you have MyChart) OR A paper copy in the mail If you have any lab test that is abnormal or we need to change your treatment, we will call you to review the results.   Testing/Procedures: NONE    Follow-Up: At [facility], you and your health needs are our priority.  As part of our continuing mission to provide you with exceptional heart care, we have created designated Provider Care Teams.  These Care Teams include your primary Cardiologist (physician) and Advanced Practice Providers (APPs -  Physician Assistants and Nurse Practitioners) who all work together to provide you with the care you need, when you need it.  We recommend signing up for the patient portal called "MyChart".  Sign up information is provided on this After Visit Summary.  MyChart is used to connect with patients for Virtual Visits (Telemedicine).  Patients are able to view lab/test results, encounter notes, upcoming appointments, etc.  Non-urgent messages can be sent to your provider as well.   To learn more about what you can do with MyChart, go to [email or web address].    Your next appointment:   1 year(s)  The format for your next appointment:   In Person  Provider:   Cristopher Peru, MD    Other Instructions Thank you for choosing [facility]!    Important Information About Sugar

## 2022-03-10 NOTE — Telephone Encounter (Signed)
Noted  

## 2022-03-16 ENCOUNTER — Encounter: Payer: Self-pay | Admitting: *Deleted

## 2022-03-16 ENCOUNTER — Other Ambulatory Visit (HOSPITAL_COMMUNITY): Payer: Self-pay | Admitting: Interventional Radiology

## 2022-03-16 ENCOUNTER — Ambulatory Visit
Admission: RE | Admit: 2022-03-16 | Discharge: 2022-03-16 | Disposition: A | Discharge status: Home / Self Care | Payer: Medicare HMO | Source: Ambulatory Visit | Attending: Specialist | Admitting: Specialist

## 2022-03-16 DIAGNOSIS — S32010G Wedge compression fracture of first lumbar vertebra, subsequent encounter for fracture with delayed healing: Secondary | ICD-10-CM

## 2022-03-16 DIAGNOSIS — S32010A Wedge compression fracture of first lumbar vertebra, initial encounter for closed fracture: Secondary | ICD-10-CM | POA: Diagnosis not present

## 2022-03-16 HISTORY — PX: IR RADIOLOGIST EVAL & MGMT: IMG5224

## 2022-03-16 NOTE — Consult Note (Addendum)
Chief Complaint: Patient was seen in consultation today for painful lumbar compression fracture at the request of Nitka,James E  Referring Physician(s): Nitka,James E  History of Present Illness: [name] is a 81 y.o. female with a history of CHF, obesity, on anticoagulation, with a history of lumbar spinal stenosis and neurogenic claudication, with worsening low back pain as presenting symptom.  She is accompanied by her son. She describes low back pain which been progressive over the past year.  Pain is exacerbated by standing, somewhat relieved when sitting, and nearly 0 when in a recliner which is how she sleeps at night.  No discrete inciting event.  She had a fall about 2 years ago but the pain was not associated with that.  She is taking tramadol twice daily for pain control; she was told that due to her other health issues she could not take stronger or narcotic level pain medication.  She rates her pain 10 out of 10 at its worst even with the tramadol, sometimes improving to 0 out of 10 when she is in her recliner.  No history of compression fracture.  No previous diagnosis of osteoporosis.  Past Medical History:  Diagnosis Date   Allergy    Anemia    Atrial fibrillation ([place])    Cardiomyopathy, nonischemic ([place])    a. EF previously 35% in 2008 with cath showing no significant CAD b. EF improved to 55-60% in 02/2020   Cataract    Cerebrovascular accident [facility])    No clinical event; asymptomatic CT finding   CHF (congestive heart failure) ([place])    Chronic kidney disease    COPD (chronic obstructive pulmonary disease) (HCC)    DDD (degenerative disc disease), cervical    Cervical   Degenerative joint disease    Right hip and knee   Essential hypertension    GERD (gastroesophageal reflux disease)    Hip dislocation, right (HCC)    Hyperlipemia    Hypothyroid    LBBB (left bundle branch block)    Pacemaker    S/P colonoscopy 2009   Dr. Sharlett Iles: reportedly  normal per pt   S/P endoscopy 1980s    Past Surgical History:  Procedure Laterality Date   ABDOMINAL HYSTERECTOMY     APPENDECTOMY     BI-VENTRICULAR IMPLANTABLE CARDIOVERTER DEFIBRILLATOR  (CRT-D)  10-31-2013   MDT Hillery Aldo CRTD implanted by Dr Lovena Le   BIOPSY  12/18/2018   Procedure: BIOPSY;  Surgeon: Daneil Dolin, MD;  Location: [facility] END[facility];  Service: Endoscopy;;  Gastric    BIV PACEMAKER INSERTION CRT-P N/A 12/17/2020   Procedure: DOWNGRAD TO BIV PACEMAKER INSERTION CRT-P;  Surgeon: Evans Lance, MD;  Location: [facility] CV LAB;  Service: Cardiovascular;  Laterality: N/A;   BREAST BIOPSY     bilaterally; benign   can not have MRI due to jaw surgery     CATARACT EXTRACTION W/PHACO Left 03/28/2021   Procedure: CATARACT EXTRACTION PHACO AND INTRAOCULAR LENS PLACEMENT ([place]);  Surgeon: Baruch Goldmann, MD;  Location: [facility] ORS;  Service: Ophthalmology;  Laterality: Left;  CDE  17.17   CATARACT EXTRACTION W/PHACO Right 04/11/2021   Procedure: CATARACT EXTRACTION PHACO AND INTRAOCULAR LENS PLACEMENT RIGHT EYE;  Surgeon: Baruch Goldmann, MD;  Location: [facility] ORS;  Service: Ophthalmology;  Laterality: Right;  right CDE=17.03   CHOLECYSTECTOMY     COLONOSCOPY  2009   Dr. Sharlett Iles: Normal   ESOPHAGOGASTRODUODENOSCOPY  07/13/2011   small hh, patchy gastric erythema  with scarring deformity of antrum, chronic gastritis, bx benign with no H.Pylori. Procedure: ESOPHAGOGASTRODUODENOSCOPY (EGD);  Surgeon: Daneil Dolin, MD;  Location: [facility];  Service: Endoscopy;  Laterality: N/A;  10:45   ESOPHAGOGASTRODUODENOSCOPY N/A 12/18/2018   Dr. Gala Romney: normal esophagus s/p dilation, small hh, erythema in stomach with benign biopsy.    IMPLANTABLE CARDIOVERTER DEFIBRILLATOR IMPLANT N/A 10/31/2013   Procedure: IMPLANTABLE CARDIOVERTER DEFIBRILLATOR IMPLANT;  Surgeon: Evans Lance, MD;  Location: [facility] CATH LAB;  Service: Cardiovascular;  Laterality: N/A;   IR RADIOLOGIST EVAL & MGMT  03/16/2022   MALONEY  DILATION N/A 12/18/2018   Procedure: Venia Minks DILATION;  Surgeon: Daneil Dolin, MD;  Location: [facility];  Service: Endoscopy;  Laterality: N/A;   MANDIBLE SURGERY     secondary to malocclusion   THYROIDECTOMY     neoplasm   Ureteral Surgery  1970    Allergies: Ciprofloxacin, Dexlansoprazole, Statins, and Amoxicillin  Medications: Prior to Admission medications   Medication Sig Start Date End Date Taking? Authorizing Provider  benazepril (LOTENSIN) 40 MG tablet Take 1 tablet by mouth once daily 11/15/21   Satira Sark, MD  carvedilol (COREG) 25 MG tablet Take 1.5 tablets (37.5 mg total) by mouth 2 (two) times daily with a meal. 03/23/21   Evans Lance, MD  cholecalciferol (VITAMIN D) 1000 UNITS tablet Take 1,000 Units by mouth daily.    [provider]  fexofenadine (ALLEGRA) 180 MG tablet Take 180 mg by mouth every morning.    [provider]  fluticasone (FLONASE) 50 MCG/ACT nasal spray Place 2 sprays into both nostrils daily as needed for allergies. 08/11/19   Corum, Rex Kras, MD  furosemide (LASIX) 40 MG tablet Take 1 tablet (40 mg total) by mouth 2 (two) times daily. as directed 11/29/21   Satira Sark, MD  hydrALAZINE (APRESOLINE) 25 MG tablet TAKE 1 TABLET BY MOUTH THREE TIMES DAILY 09/09/21   Satira Sark, MD  lansoprazole (PREVACID) 30 MG capsule TAKE 1 CAPSULE BY MOUTH BEFORE BREAKFAST 02/02/22   Rourk, Cristopher Estimable, MD  methylPREDNISolone (MEDROL DOSEPAK) 4 MG TBPK tablet Take as directed 6 day dose pak. Patient not taking: Reported on 03/09/2022 02/01/22   Jessy Oto, MD  ondansetron (ZOFRAN) 4 MG tablet Take 1 tablet (4 mg total) by mouth every 8 (eight) hours as needed for nausea or vomiting. 10/15/19   Corum, Rex Kras, MD  rivaroxaban (XARELTO) 20 MG TABS tablet TAKE 1 TABLET BY MOUTH ONCE DAILY WITH SUPPER 02/06/22   Satira Sark, MD  SYNTHROID 125 MCG tablet Take 1 tablet by mouth once daily Patient taking differently: Take 125 mcg by  mouth daily before breakfast. 11/06/19   Corum, Rex Kras, MD  traMADol (ULTRAM) 50 MG tablet Take 1 tablet (50 mg total) by mouth every 6 (six) hours as needed for moderate pain. Patient taking differently: Take 50 mg by mouth every 12 (twelve) hours as needed for moderate pain or severe pain. 10/28/19   Jessy Oto, MD  triamcinolone cream (KENALOG) 0.1 % Apply 1 application topically daily as needed (Breakout on legs). Patient not taking: Reported on 03/09/2022    [provider]     Family History  Problem Relation Age of Onset   Brain cancer Mother    Heart disease Mother    Hypertension Mother    Stroke Mother    Atrial fibrillation Other    Stroke Brother    Colon cancer Neg Hx  Social History   Socioeconomic History   Marital status: Widowed    Spouse name: Not on file   Number of children: 2   Years of education: Not on file   Highest education level: Not on file  Occupational History   Occupation: retired  Tobacco Use   Smoking status: Never   Smokeless tobacco: Never  Vaping Use   Vaping Use: Never used  Substance and Sexual Activity   Alcohol use: No    Alcohol/week: 0.0 standard drinks of alcohol   Drug use: No   Sexual activity: Not Currently  Other Topics Concern   Not on file  Social History Narrative   Widowed         Social Determinants of Health   Financial Resource Strain: Not on file  Food Insecurity: Not on file  Transportation Needs: Not on file  Physical Activity: Not on file  Stress: Not on file  Social Connections: Not on file    ECOG Status: 2 - Symptomatic, <50% confined to bed       Advance Care Plan: The advanced care plan/surrogate decision maker was discussed at the time of visit and the patient did not wish to discuss or was not able to name a surrogate decision maker or provide an advance care plan.    Physical Exam Vital Signs: BP (!) 185/78 (BP Location: Right Arm)   Pulse 85   SpO2 95%   Constitutional: Oriented to person, place, and time.  Obese, well-developed and well-nourished. No distress.   HENT:  Head: Normocephalic and atraumatic.  Eyes: Conjunctivae and EOM are normal. Right eye exhibits no discharge. Left eye exhibits no discharge. No scleral icterus.  Neck: No JVD present.  Pulmonary/Chest: Effort normal. No stridor. No respiratory distress.  Abdomen: soft, non distended Neurological:  alert and oriented to person, place, and time.  Skin: Skin is warm and dry.  not diaphoretic.  Musculoskeletal: Tenderness thoracolumbar junction Psychiatric:   normal mood and affect.   behavior is normal. Judgment and thought content normal.    Review of Systems: A 12 point ROS discussed and pertinent positives are indicated in the HPI above.  All other systems are negative.    Imaging: IR Radiologist Eval & Mgmt  Result Date: 03/16/2022 Please refer to notes tab for details about interventional procedure. (Op Note)  CUP PACEART INCLINIC DEVICE CHECK  Result Date: 03/09/2022 CRT-P device check in clinic. Normal device function. Thresholds, sensing, impedance consistent with previous measurements. Histograms appropriate for patient and level of activity. No mode switches or ventricular high rate episodes. Patient bi-ventricularly pacing 91.7% of the time. Device programmed with appropriate safety margins. Device heart failure diagnostics are within normal limits and stable over time. Estimated longevity 8.9 years. Patient enrolled in remote follow-up. Plan to check device remotely in 3 months and every 6-12 months in office.Myrtie Hawk, BSN, RN  CT LUMBAR SPINE WITHOUT CONTRAST  TECHNIQUE: Multidetector CT imaging of the lumbar spine was performed without intravenous contrast administration. Multiplanar CT image reconstructions were also generated.  RADIATION DOSE REDUCTION: This exam was performed according to the departmental dose-optimization program which includes  automated exposure control, adjustment of the mA and/or kV according to patient size and/or use of iterative reconstruction technique.  COMPARISON: None Available.  FINDINGS: Segmentation: 5 lumbar type vertebrae.  Alignment: Normal.  Vertebrae: Chronic L1 compression fracture with progressive height loss of approximately 90% centrally. No retropulsion. No acute fracture.  Paraspinal and other soft tissues: Calcific aortic atherosclerosis.  Disc levels: Degenerative disc disease is greatest at L4-5. There is no lumbar spinal canal stenosis. There is mild bilateral L4 and L5 foraminal stenosis.  IMPRESSION: 1. No acute fracture or static subluxation of the lumbar spine. 2. Chronic L1 compression fracture with progressive height loss of approximately 90% centrally. 3. Mild bilateral L4 and L5 foraminal stenosis. 4. Aortic Atherosclerosis (ICD10-I70.0).   Electronically Signed By: Ulyses Jarred M.D. On: 02/14/2022 03:12   On review of imaging, there has been progression of the compression deformity since   previous radiographs of 10/28/2021.  The fracture was not present on earlier CT 01/02/2018.     Labs:  CBC: No results for input(s): "WBC", "HGB", "HCT", "PLT" in the last 8760 hours.  COAGS: No results for input(s): "INR", "APTT" in the last 8760 hours.  BMP: No results for input(s): "NA", "K", "CL", "CO2", "GLUCOSE", "BUN", "CALCIUM", "CREATININE", "GFRNONAA", "GFRAA" in the last 8760 hours.  Invalid input(s): "CMP"  LIVER FUNCTION TESTS: No results for input(s): "BILITOT", "AST", "ALT", "ALKPHOS", "PROT", "ALBUMIN" in the last 8760 hours.  TUMOR MARKERS: No results for input(s): "AFPTM", "CEA", "CA199", "CHROMGRNA" in the last 8760 hours.  Assessment and Plan:  My impression is that this patient has progressive lumbar L1 compression fracture deformity which likely contributes or accounts for the exacerbation of her low   back pain.  Based on cross-sectional  imaging, this would be anatomically approachable for percutaneous intervention.  No severe spinal stenosis or other contraindications.  No suggestion of metastatic disease or other pathologic findings to indicate a need for concomitant core biopsy. Given the  lack of adequate symptom relief with time and as aggressive pain medication regimen that she can tolerate, and   limitations of activities of daily living, the patient  is clinically an appropriate candidate for consideration of vertebral augmentation. I discussed with the patient and her son the pathophysiology of vertebral compression fracture deformities; the stable nature of these which does not require emergent treatment; natural history which includes healing over some unpredictable number of months.  We discussed treatment options including watchful waiting, surgical fixation, and percutaneous kyphoplasty/vertebroplasty.  We discussed in detail the percutaneous kyphoplasty technique, anticipated benefits, time course to symptom resolution, possible risks and side effects.  We discussed his elevated risk of additional level fractures with or without vertebral augmentation.  We discussed the long-term need for continued bone building therapy managed by the patient's PCP.   They seemed to understand, and did ask appropriate questions. The patient is motivated to proceed with treatment ASAP.  Accordingly, we schedule percutaneous lumbar L1 kyphoplasty under moderate sedation at [facility] or [facility] as an outpatient at the patient's convenience, pending carrier approval if needed.  We will need to coordinate anticoagulation management with her cardiologist preprocedure.   Thank you for this interesting consult.  I greatly enjoyed meeting [name] and look forward to participating in their care.  A copy of this report was sent to the requesting provider on this date.  Electronically Signed: Rickard Rhymes 03/16/2022, 3:03 PM   I  spent a total of 40 Minutes    in face to face in clinical consultation, greater than 50% of which was counseling/coordinating care for painful subacute L1 compression fracture deformity.

## 2022-03-17 ENCOUNTER — Telehealth: Payer: Self-pay | Admitting: *Deleted

## 2022-03-17 NOTE — Telephone Encounter (Signed)
This was already addressed by Dr. Lovena Le during the most recent office visit.  I have forwarded Dr. Tanna Furry note to the surgeon's office.  I have called and the spoke with the patient, she is aware to hold the Xarelto for 3 days prior to the procedure and restart as soon as possible afterward at the surgeon's discretion.

## 2022-03-17 NOTE — Telephone Encounter (Signed)
   Pre-operative Risk Assessment    Patient Name: [name]  DOB: 07/06/1941 MRN: [id number]      Request for Surgical Clearance    Procedure:   L1 KYPHOPLASTY  Date of Surgery:  Clearance 03/21/22 URGENT                                Surgeon:  DR. DANIEL HASSELL Surgeon's Group or Practice Name:  [facility] DEPT Phone number:  [phone] ATTN: Gildardo Pounds Fax number:  [phone]   Type of Clearance Requested:   - Medical  - Pharmacy:  Hold Rivaroxaban (Xarelto) x 2 DOSES PRIOR TO PROCEDURE    Type of Anesthesia:   MODERATE SEDATION   Additional requests/questions:    Jiles Prows   03/17/2022, 12:15 PM

## 2022-03-21 ENCOUNTER — Ambulatory Visit (INDEPENDENT_AMBULATORY_CARE_PROVIDER_SITE_OTHER): Payer: Medicare HMO

## 2022-03-21 DIAGNOSIS — I5022 Chronic systolic (congestive) heart failure: Secondary | ICD-10-CM | POA: Diagnosis not present

## 2022-03-24 ENCOUNTER — Other Ambulatory Visit: Payer: Self-pay | Admitting: Interventional Radiology

## 2022-03-24 DIAGNOSIS — M5440 Lumbago with sciatica, unspecified side: Secondary | ICD-10-CM

## 2022-03-24 LAB — CUP PACEART REMOTE DEVICE CHECK
Battery Remaining Longevity: 99 mo
Battery Voltage: 3.01 V
Brady Statistic AP VP Percent: 51.4 %
Brady Statistic AP VS Percent: 0.25 %
Brady Statistic AS VP Percent: 42.18 %
Brady Statistic AS VS Percent: 6.17 %
Brady Statistic RA Percent Paced: 55.9 %
Brady Statistic RV Percent Paced: 71.57 %
Date Time Interrogation Session: 20230905003646
Implantable Lead Implant Date: 20150417
Implantable Lead Implant Date: 20150417
Implantable Lead Implant Date: 20150417
Implantable Lead Location: 753858
Implantable Lead Location: 753859
Implantable Lead Location: 753860
Implantable Lead Model: 4298
Implantable Lead Model: 5076
Implantable Lead Model: 6935
Implantable Pulse Generator Implant Date: 20220603
Lead Channel Impedance Value: 247 Ohm
Lead Channel Impedance Value: 266 Ohm
Lead Channel Impedance Value: 266 Ohm
Lead Channel Impedance Value: 266 Ohm
Lead Channel Impedance Value: 285 Ohm
Lead Channel Impedance Value: 285 Ohm
Lead Channel Impedance Value: 304 Ohm
Lead Channel Impedance Value: 304 Ohm
Lead Channel Impedance Value: 399 Ohm
Lead Channel Impedance Value: 437 Ohm
Lead Channel Impedance Value: 437 Ohm
Lead Channel Impedance Value: 437 Ohm
Lead Channel Impedance Value: 456 Ohm
Lead Channel Impedance Value: 494 Ohm
Lead Channel Pacing Threshold Amplitude: 0.625 V
Lead Channel Pacing Threshold Amplitude: 0.875 V
Lead Channel Pacing Threshold Amplitude: 1.625 V
Lead Channel Pacing Threshold Pulse Width: 0.4 ms
Lead Channel Pacing Threshold Pulse Width: 0.4 ms
Lead Channel Pacing Threshold Pulse Width: 0.6 ms
Lead Channel Sensing Intrinsic Amplitude: 1.875 mV
Lead Channel Sensing Intrinsic Amplitude: 1.875 mV
Lead Channel Sensing Intrinsic Amplitude: 9.75 mV
Lead Channel Sensing Intrinsic Amplitude: 9.75 mV
Lead Channel Setting Pacing Amplitude: 1.5 V
Lead Channel Setting Pacing Amplitude: 2 V
Lead Channel Setting Pacing Amplitude: 2.25 V
Lead Channel Setting Pacing Pulse Width: 0.4 ms
Lead Channel Setting Pacing Pulse Width: 0.6 ms
Lead Channel Setting Sensing Sensitivity: 0.9 mV

## 2022-03-27 DIAGNOSIS — H04123 Dry eye syndrome of bilateral lacrimal glands: Secondary | ICD-10-CM | POA: Diagnosis not present

## 2022-04-07 DIAGNOSIS — Z0000 Encounter for general adult medical examination without abnormal findings: Secondary | ICD-10-CM | POA: Diagnosis not present

## 2022-04-14 NOTE — Progress Notes (Signed)
Remote pacemaker transmission.   

## 2022-04-15 ENCOUNTER — Other Ambulatory Visit: Payer: Self-pay | Admitting: Internal Medicine

## 2022-04-19 ENCOUNTER — Encounter (HOSPITAL_COMMUNITY): Payer: Self-pay

## 2022-04-19 ENCOUNTER — Ambulatory Visit (HOSPITAL_COMMUNITY): Payer: Medicare HMO

## 2022-05-01 DIAGNOSIS — R7303 Prediabetes: Secondary | ICD-10-CM | POA: Diagnosis not present

## 2022-05-01 DIAGNOSIS — E559 Vitamin D deficiency, unspecified: Secondary | ICD-10-CM | POA: Diagnosis not present

## 2022-05-01 DIAGNOSIS — E039 Hypothyroidism, unspecified: Secondary | ICD-10-CM | POA: Diagnosis not present

## 2022-05-01 DIAGNOSIS — E782 Mixed hyperlipidemia: Secondary | ICD-10-CM | POA: Diagnosis not present

## 2022-05-05 DIAGNOSIS — I482 Chronic atrial fibrillation, unspecified: Secondary | ICD-10-CM | POA: Diagnosis not present

## 2022-05-05 DIAGNOSIS — I700 Atherosclerosis of aorta: Secondary | ICD-10-CM | POA: Diagnosis not present

## 2022-05-05 DIAGNOSIS — E782 Mixed hyperlipidemia: Secondary | ICD-10-CM | POA: Diagnosis not present

## 2022-05-05 DIAGNOSIS — I10 Essential (primary) hypertension: Secondary | ICD-10-CM | POA: Diagnosis not present

## 2022-05-05 DIAGNOSIS — N1831 Chronic kidney disease, stage 3a: Secondary | ICD-10-CM | POA: Diagnosis not present

## 2022-05-05 DIAGNOSIS — I429 Cardiomyopathy, unspecified: Secondary | ICD-10-CM | POA: Diagnosis not present

## 2022-05-05 DIAGNOSIS — I5022 Chronic systolic (congestive) heart failure: Secondary | ICD-10-CM | POA: Diagnosis not present

## 2022-05-05 DIAGNOSIS — R7303 Prediabetes: Secondary | ICD-10-CM | POA: Diagnosis not present

## 2022-05-05 DIAGNOSIS — Z23 Encounter for immunization: Secondary | ICD-10-CM | POA: Diagnosis not present

## 2022-05-23 NOTE — Progress Notes (Unsigned)
Cardiology Office Note  Date: 05/24/2022   ID: Britainy, Kozub 1940/07/26, MRN [id number]  PCP:  Celene Squibb, MD  Cardiologist:  Rozann Lesches, MD Electrophysiologist:  Cristopher Peru, MD   Chief Complaint  Patient presents with   Cardiac follow-up    History of Present Illness: [name] is an 81 y.o. female last seen in February by Ms. Strader PA-C (I last saw her in 2021).  She is here for a routine visit.  Reports no palpitations or chest pain, stable NYHA class II dyspnea.  She has not been able to wrap her legs consistently as it has led to more abdominal distention.  She has chronic lymphedema.  Her weight is stable and she remains on diuretics.  Also limited by back/disc problems and may need to have kyphoplasty considered next year.  She is following with an orthopedist.  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 Lovena Le.  Device check in September revealed normal function.  I reviewed her medications which are outlined below.  No reported spontaneous bleeding problems on Xarelto.  She had recent lab work with Dr. Nevada Crane which I reviewed.  Follow-up echocardiogram in August 2021 revealed normal LVEF at 55 to 60% with mild diastolic dysfunction and normal RV contraction as well as estimated RVSP.  Past Medical History:  Diagnosis Date   Allergy    Anemia    Atrial fibrillation ([place])    Cardiomyopathy, nonischemic ([place])    a. EF previously 35% in 2008 with cath showing no significant CAD b. EF improved to 55-60% in 02/2020   Cataract    Cerebrovascular accident [facility])    No clinical event; asymptomatic CT finding   CHF (congestive heart failure) ([place])    Chronic kidney disease    COPD (chronic obstructive pulmonary disease) (H[facility])    DDD (degenerative disc disease), cervical    Cervical   Degenerative joint disease    Right hip and knee   Essential hypertension    GERD (gastroesophageal reflux disease)    Hip dislocation, right ([facility])     Hyperlipemia    Hypothyroid    LBBB (left bundle branch block)    Pacemaker    S/P colonoscopy 2009   Dr. Sharlett Iles: reportedly normal per pt   S/P endoscopy 1980s    Past Surgical History:  Procedure Laterality Date   ABDOMINAL HYSTERECTOMY     APPENDECTOMY     BI-VENTRICULAR IMPLANTABLE CARDIOVERTER DEFIBRILLATOR  (CRT-D)  10-31-2013   MDT Hillery Aldo CRTD implanted by Dr Lovena Le   BIOPSY  12/18/2018   Procedure: BIOPSY;  Surgeon: Daneil Dolin, MD;  [facility];  Service: Endoscopy;;  Gastric    BIV PACEMAKER INSERTION CRT-P N/A 12/17/2020   Procedure: DOWNGRAD TO BIV PACEMAKER INSERTION CRT-P;  Surgeon: Evans Lance, MD;  Location: [place] CV LAB;  Service: Cardiovascular;  Laterality: N/A;   BREAST BIOPSY     bilaterally; benign   can not have MRI due to jaw surgery     CATARACT EXTRACTION W/PHACO Left 03/28/2021   Procedure: CATARACT EXTRACTION PHACO AND INTRAOCULAR LENS PLACEMENT ([place]);  Surgeon: Baruch Goldmann, MD;  Location: [facility];  Service: Ophthalmology;  Laterality: Left;  CDE  17.17   CATARACT EXTRACTION W/PHACO Right 04/11/2021   Procedure: CATARACT EXTRACTION PHACO AND INTRAOCULAR LENS PLACEMENT RIGHT EYE;  Surgeon: Baruch Goldmann, MD;  Location: [facility];  Service: Ophthalmology;  Laterality: Right;  right CDE=17.03   CHOLECYSTECTOMY  COLONOSCOPY  2009   Dr. Sharlett Iles: Normal   ESOPHAGOGASTRODUODENOSCOPY  07/13/2011   small hh, patchy gastric erythema with scarring deformity of antrum, chronic gastritis, bx benign with no H.Pylori. Procedure: ESOPHAGOGASTRODUODENOSCOPY (EGD);  Surgeon: Daneil Dolin, MD;  [facility];  Service: Endoscopy;  Laterality: N/A;  10:45   ESOPHAGOGASTRODUODENOSCOPY N/A 12/18/2018   Dr. Gala Romney: normal esophagus s/p dilation, small hh, erythema in stomach with benign biopsy.    IMPLANTABLE CARDIOVERTER DEFIBRILLATOR IMPLANT N/A 10/31/2013   Procedure: IMPLANTABLE CARDIOVERTER DEFIBRILLATOR IMPLANT;  Surgeon: Evans Lance, MD;  [facility]: [facility] CATH LAB;  Service: Cardiovascular;  Laterality: N/A;   IR RADIOLOGIST EVAL & MGMT  03/16/2022   MALONEY DILATION N/A 12/18/2018   Procedure: Venia Minks DILATION;  Surgeon: Daneil Dolin, MD;  [facility];  Service: Endoscopy;  Laterality: N/A;   MANDIBLE SURGERY     secondary to malocclusion   THYROIDECTOMY     neoplasm   Ureteral Surgery  1970    Current Outpatient Medications  Medication Sig Dispense Refill   benazepril (LOTENSIN) 40 MG tablet Take 1 tablet by mouth once daily 90 tablet 2   carvedilol (COREG) 25 MG tablet TAKE 1 & 1/2 (ONE & ONE-HALF) TABLETS BY MOUTH TWICE DAILY WITH A MEAL 270 tablet 0   cholecalciferol (VITAMIN D) 1000 UNITS tablet Take 1,000 Units by mouth daily.     fexofenadine (ALLEGRA) 180 MG tablet Take 180 mg by mouth every morning.     fluticasone (FLONASE) 50 MCG/ACT nasal spray Place 2 sprays into both nostrils daily as needed for allergies. 16 g 0   furosemide (LASIX) 40 MG tablet Take 1 tablet (40 mg total) by mouth 2 (two) times daily. as directed 60 tablet 6   hydrALAZINE (APRESOLINE) 25 MG tablet TAKE 1 TABLET BY MOUTH THREE TIMES DAILY 270 tablet 3   lansoprazole (PREVACID) 30 MG capsule TAKE 1 CAPSULE BY MOUTH BEFORE BREAKFAST 90 capsule 1   ondansetron (ZOFRAN) 4 MG tablet Take 1 tablet (4 mg total) by mouth every 8 (eight) hours as needed for nausea or vomiting. 20 tablet 0   RESTASIS 0.05 % ophthalmic emulsion Place one drop in both eyes daily as needed     rivaroxaban (XARELTO) 20 MG TABS tablet TAKE 1 TABLET BY MOUTH ONCE DAILY WITH SUPPER 90 tablet 1   SYNTHROID 125 MCG tablet Take 1 tablet by mouth once daily (Patient taking differently: Take 125 mcg by mouth daily before breakfast.) 30 tablet 11   traMADol (ULTRAM) 50 MG tablet Take 1 tablet (50 mg total) by mouth every 6 (six) hours as needed for moderate pain. (Patient taking differently: Take 50 mg by mouth every 12 (twelve) hours as needed for moderate pain  or severe pain.) 30 tablet 0   No current facility-administered medications for this visit.   Allergies:  Ciprofloxacin, Dexlansoprazole, Statins, and Amoxicillin   ROS: No palpitations or syncope.  Physical Exam: VS:  BP (!) 142/80   Pulse 72   Ht '5\' 5"'$  (1.651 m)   Wt 237 lb 12.8 oz (107.9 kg)   SpO2 97%   BMI 39.57 kg/m , BMI Body mass index is 39.57 kg/m.  Wt Readings from Last 3 Encounters:  05/24/22 237 lb 12.8 oz (107.9 kg)  03/09/22 236 lb 6.4 oz (107.2 kg)  02/23/22 235 lb (106.6 kg)    General: Patient appears comfortable at rest. HEENT: Conjunctiva and lids normal. Neck: Supple, no elevated JVP or carotid bruits. Lungs:  Clear to auscultation, nonlabored breathing at rest. Cardiac: Regular rate and rhythm, no S3 or significant systolic murmur. Extremities: Chronic appearing lymphedema, left worse than right.  ECG:  An ECG dated 03/09/2022 was personally reviewed today and demonstrated:  Ventricular paced rhythm with PVC.  Recent Labwork:  October 2023: TSH 2.48, hemoglobin 11.8, platelets 161, BUN 14, creatinine 1.14, potassium 4.1, AST 20, ALT 15, cholesterol 210, triglycerides 106, HDL 40, LDL 151, hemoglobin A1c 6%  Other Studies Reviewed Today:  Echocardiogram 03/08/2020:  1. Left ventricular ejection fraction, by estimation, is 55 to 60%. The  left ventricle has normal function. The left ventricle has no regional  wall motion abnormalities. There is mild left ventricular hypertrophy.  Left ventricular diastolic parameters  are consistent with Grade I diastolic dysfunction (impaired relaxation).   2. Right ventricular systolic function is normal. The right ventricular  size is normal. There is normal pulmonary artery systolic pressure.   3. Left atrial size was mildly dilated.   4. The mitral valve is normal in structure. No evidence of mitral valve  regurgitation. No evidence of mitral stenosis.   5. The aortic valve has an indeterminant number of cusps.  Aortic valve  regurgitation is not visualized. No aortic stenosis is present.   6. The inferior vena cava is normal in size with greater than 50%  respiratory variability, suggesting right atrial pressure of 3 mmHg.   Assessment and Plan:  1.  Paroxysmal atrial fibrillation with CHA2DS2-VASc score of 7.  She remains on Xarelto for stroke prophylaxis, I reviewed her recent lab work.  She reports no spontaneous bleeding problems.  Continue Coreg.  2.  HFrecEF with history of nonischemic cardiomyopathy, LVEF 55 to 60% by follow-up echocardiogram in August 2021.  Plan to continue Coreg and Lotensin.  She is on Lasix for concurrent treatment of chronic lymphedema.  3.  Chronic lymphedema.  Discussed using lower compression mechanical wraps below the knees at least to help with her lower leg and foot swelling.  No change in current diuretic regimen.  4.  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 Lovena Le.  5.  Essential hypertension, continue Lotensin, Coreg, and hydralazine.  She is tracking her blood pressure at home.  Medication Adjustments/Labs and Tests Ordered: Current medicines are reviewed at length with the patient today.  Concerns regarding medicines are outlined above.   Tests Ordered: No orders of the defined types were placed in this encounter.   Medication Changes: No orders of the defined types were placed in this encounter.   Disposition:  Follow up  6 months.  Signed, Satira Sark, MD, [facility] 05/24/2022 9:40 AM    [facility] [place]. [address], [place], [place] [address] Phone: [phone]; Fax: [phone]

## 2022-05-24 ENCOUNTER — Ambulatory Visit: Payer: Medicare HMO | Attending: Cardiology | Admitting: Cardiology

## 2022-05-24 ENCOUNTER — Encounter: Payer: Self-pay | Admitting: *Deleted

## 2022-05-24 ENCOUNTER — Encounter: Payer: Self-pay | Admitting: Cardiology

## 2022-05-24 VITALS — BP 142/80 | HR 72 | Ht 65.0 in | Wt 237.8 lb

## 2022-05-24 DIAGNOSIS — I480 Paroxysmal atrial fibrillation: Secondary | ICD-10-CM | POA: Diagnosis not present

## 2022-05-24 DIAGNOSIS — I10 Essential (primary) hypertension: Secondary | ICD-10-CM | POA: Diagnosis not present

## 2022-05-24 DIAGNOSIS — I428 Other cardiomyopathies: Secondary | ICD-10-CM

## 2022-05-24 DIAGNOSIS — I890 Lymphedema, not elsewhere classified: Secondary | ICD-10-CM

## 2022-05-24 NOTE — Patient Instructions (Addendum)

## 2022-07-14 LAB — CUP PACEART REMOTE DEVICE CHECK
Battery Remaining Longevity: 99 mo
Battery Voltage: 3 V
Brady Statistic AP VP Percent: 65.39 %
Brady Statistic AP VS Percent: 0.21 %
Brady Statistic AS VP Percent: 26.1 %
Brady Statistic AS VS Percent: 8.3 %
Brady Statistic RA Percent Paced: 70.97 %
Brady Statistic RV Percent Paced: 85.5 %
Date Time Interrogation Session: 20231229043532
Implantable Lead Connection Status: 753985
Implantable Lead Connection Status: 753985
Implantable Lead Connection Status: 753985
Implantable Lead Implant Date: 20150417
Implantable Lead Implant Date: 20150417
Implantable Lead Implant Date: 20150417
Implantable Lead Location: 753858
Implantable Lead Location: 753859
Implantable Lead Location: 753860
Implantable Lead Model: 4298
Implantable Lead Model: 5076
Implantable Lead Model: 6935
Implantable Pulse Generator Implant Date: 20220603
Lead Channel Impedance Value: 285 Ohm
Lead Channel Impedance Value: 285 Ohm
Lead Channel Impedance Value: 304 Ohm
Lead Channel Impedance Value: 304 Ohm
Lead Channel Impedance Value: 304 Ohm
Lead Channel Impedance Value: 323 Ohm
Lead Channel Impedance Value: 323 Ohm
Lead Channel Impedance Value: 342 Ohm
Lead Channel Impedance Value: 456 Ohm
Lead Channel Impedance Value: 513 Ohm
Lead Channel Impedance Value: 532 Ohm
Lead Channel Impedance Value: 532 Ohm
Lead Channel Impedance Value: 551 Ohm
Lead Channel Impedance Value: 551 Ohm
Lead Channel Pacing Threshold Amplitude: 0.625 V
Lead Channel Pacing Threshold Amplitude: 0.75 V
Lead Channel Pacing Threshold Amplitude: 1.625 V
Lead Channel Pacing Threshold Pulse Width: 0.4 ms
Lead Channel Pacing Threshold Pulse Width: 0.4 ms
Lead Channel Pacing Threshold Pulse Width: 0.6 ms
Lead Channel Sensing Intrinsic Amplitude: 1.75 mV
Lead Channel Sensing Intrinsic Amplitude: 1.75 mV
Lead Channel Sensing Intrinsic Amplitude: 9 mV
Lead Channel Sensing Intrinsic Amplitude: 9 mV
Lead Channel Setting Pacing Amplitude: 1.5 V
Lead Channel Setting Pacing Amplitude: 2 V
Lead Channel Setting Pacing Amplitude: 2.25 V
Lead Channel Setting Pacing Pulse Width: 0.4 ms
Lead Channel Setting Pacing Pulse Width: 0.6 ms
Lead Channel Setting Sensing Sensitivity: 0.9 mV
Zone Setting Status: 755011

## 2022-07-25 ENCOUNTER — Other Ambulatory Visit: Payer: Self-pay | Admitting: Internal Medicine

## 2022-08-09 DIAGNOSIS — E782 Mixed hyperlipidemia: Secondary | ICD-10-CM | POA: Diagnosis not present

## 2022-08-09 DIAGNOSIS — E039 Hypothyroidism, unspecified: Secondary | ICD-10-CM | POA: Diagnosis not present

## 2022-08-09 DIAGNOSIS — E559 Vitamin D deficiency, unspecified: Secondary | ICD-10-CM | POA: Diagnosis not present

## 2022-08-09 DIAGNOSIS — R7303 Prediabetes: Secondary | ICD-10-CM | POA: Diagnosis not present

## 2022-08-14 ENCOUNTER — Other Ambulatory Visit: Payer: Self-pay | Admitting: Internal Medicine

## 2022-08-14 ENCOUNTER — Other Ambulatory Visit: Payer: Self-pay | Admitting: Cardiology

## 2022-08-15 DIAGNOSIS — N1831 Chronic kidney disease, stage 3a: Secondary | ICD-10-CM | POA: Diagnosis not present

## 2022-08-15 DIAGNOSIS — I700 Atherosclerosis of aorta: Secondary | ICD-10-CM | POA: Diagnosis not present

## 2022-08-15 DIAGNOSIS — I10 Essential (primary) hypertension: Secondary | ICD-10-CM | POA: Diagnosis not present

## 2022-08-15 DIAGNOSIS — Z0001 Encounter for general adult medical examination with abnormal findings: Secondary | ICD-10-CM | POA: Diagnosis not present

## 2022-08-15 DIAGNOSIS — Z23 Encounter for immunization: Secondary | ICD-10-CM | POA: Diagnosis not present

## 2022-08-15 DIAGNOSIS — I429 Cardiomyopathy, unspecified: Secondary | ICD-10-CM | POA: Diagnosis not present

## 2022-08-15 DIAGNOSIS — E782 Mixed hyperlipidemia: Secondary | ICD-10-CM | POA: Diagnosis not present

## 2022-08-15 DIAGNOSIS — I130 Hypertensive heart and chronic kidney disease with heart failure and stage 1 through stage 4 chronic kidney disease, or unspecified chronic kidney disease: Secondary | ICD-10-CM | POA: Diagnosis not present

## 2022-08-15 DIAGNOSIS — G894 Chronic pain syndrome: Secondary | ICD-10-CM | POA: Diagnosis not present

## 2022-08-15 DIAGNOSIS — I482 Chronic atrial fibrillation, unspecified: Secondary | ICD-10-CM | POA: Diagnosis not present

## 2022-09-04 ENCOUNTER — Other Ambulatory Visit: Payer: Self-pay | Admitting: Cardiology

## 2022-09-04 DIAGNOSIS — J069 Acute upper respiratory infection, unspecified: Secondary | ICD-10-CM | POA: Diagnosis not present

## 2022-09-05 NOTE — Telephone Encounter (Signed)
Prescription refill request for Xarelto received.  Indication: Last office visit: 05/24/22  Myles Gip MD Weight: 107.9kg Age: 82 Scr: 1.06 on 08/09/22 CrCl:  69.70  Based on above findings Xarelto 32m daily is the appropriate dose.  Refill approved.

## 2022-09-06 ENCOUNTER — Encounter: Payer: Self-pay | Admitting: Internal Medicine

## 2022-09-20 ENCOUNTER — Ambulatory Visit: Payer: Medicare HMO | Admitting: Gastroenterology

## 2022-10-02 NOTE — Progress Notes (Signed)
GI Office Note    Referring Provider: Celene Squibb, MD Primary Care Physician:  Celene Squibb, MD  Primary Gastroenterologist: Garfield Cornea, MD   Chief Complaint   No chief complaint on file.   History of Present Illness   [name] is a 82 y.o. female presenting today          Medications   Current Outpatient Medications  Medication Sig Dispense Refill   benazepril (LOTENSIN) 40 MG tablet Take 1 tablet by mouth once daily 90 tablet 0   carvedilol (COREG) 25 MG tablet TAKE 1 & 1/2 (ONE & ONE-HALF) TABLETS BY MOUTH TWICE DAILY WITH MEALS 270 tablet 0   cholecalciferol (VITAMIN D) 1000 UNITS tablet Take 1,000 Units by mouth daily.     fexofenadine (ALLEGRA) 180 MG tablet Take 180 mg by mouth every morning.     fluticasone (FLONASE) 50 MCG/ACT nasal spray Place 2 sprays into both nostrils daily as needed for allergies. 16 g 0   furosemide (LASIX) 40 MG tablet Take 1 tablet (40 mg total) by mouth 2 (two) times daily. as directed 60 tablet 6   hydrALAZINE (APRESOLINE) 25 MG tablet TAKE 1 TABLET BY MOUTH THREE TIMES DAILY 270 tablet 0   lansoprazole (PREVACID) 30 MG capsule TAKE 1 CAPSULE BY MOUTH BEFORE BREAKFAST 90 capsule 3   ondansetron (ZOFRAN) 4 MG tablet Take 1 tablet (4 mg total) by mouth every 8 (eight) hours as needed for nausea or vomiting. 20 tablet 0   RESTASIS 0.05 % ophthalmic emulsion Place one drop in both eyes daily as needed     rivaroxaban (XARELTO) 20 MG TABS tablet TAKE 1 TABLET BY MOUTH ONCE DAILY WITH SUPPER 90 tablet 1   SYNTHROID 125 MCG tablet Take 1 tablet by mouth once daily (Patient taking differently: Take 125 mcg by mouth daily before breakfast.) 30 tablet 11   traMADol (ULTRAM) 50 MG tablet Take 1 tablet (50 mg total) by mouth every 6 (six) hours as needed for moderate pain. (Patient taking differently: Take 50 mg by mouth every 12 (twelve) hours as needed for moderate pain or severe pain.) 30 tablet 0   No current facility-administered  medications for this visit.    Allergies   Allergies as of 10/03/2022 - Review Complete 05/24/2022  Allergen Reaction Noted   Ciprofloxacin Cough 10/17/2013   Dexlansoprazole  09/13/2011   Statins Other (See Comments) 05/30/2012   Amoxicillin Rash 07/14/2019     Past Medical History   Past Medical History:  Diagnosis Date   Allergy    Anemia    Atrial fibrillation ([place])    Cardiomyopathy, nonischemic ([place])    a. EF previously 35% in 2008 with cath showing no significant CAD b. EF improved to 55-60% in 02/2020   Cataract    Cerebrovascular accident [facility])    No clinical event; asymptomatic CT finding   CHF (congestive heart failure) ([place])    Chronic kidney disease    COPD (chronic obstructive pulmonary disease) ([place])    DDD (degenerative disc disease), cervical    Cervical   Degenerative joint disease    Right hip and knee   Essential hypertension    GERD (gastroesophageal reflux disease)    Hip dislocation, right (HCC)    Hyperlipemia    Hypothyroid    LBBB (left bundle branch block)    Pacemaker    S/P colonoscopy 2009   Dr. Sharlett Iles: reportedly normal per pt   S/P endoscopy 1980s  Past Surgical History   Past Surgical History:  Procedure Laterality Date   ABDOMINAL HYSTERECTOMY     APPENDECTOMY     BI-VENTRICULAR IMPLANTABLE CARDIOVERTER DEFIBRILLATOR  (CRT-D)  10-31-2013   MDT Hillery Aldo CRTD implanted by Dr Lovena Le   BIOPSY  12/18/2018   Procedure: BIOPSY;  Surgeon: Daneil Dolin, MD;  [facility];  Service: Endoscopy;;  Gastric    BIV PACEMAKER INSERTION CRT-P N/A 12/17/2020   Procedure: DOWNGRAD TO BIV PACEMAKER INSERTION CRT-P;  Surgeon: Evans Lance, MD;  Location: [place] CV LAB;  Service: Cardiovascular;  Laterality: N/A;   BREAST BIOPSY     bilaterally; benign   can not have MRI due to jaw surgery     CATARACT EXTRACTION W/PHACO Left 03/28/2021   Procedure: CATARACT EXTRACTION PHACO AND INTRAOCULAR LENS PLACEMENT ([place]);  Surgeon:  Baruch Goldmann, MD;  Location: [facility];  Service: Ophthalmology;  Laterality: Left;  CDE  17.17   CATARACT EXTRACTION W/PHACO Right 04/11/2021   Procedure: CATARACT EXTRACTION PHACO AND INTRAOCULAR LENS PLACEMENT RIGHT EYE;  Surgeon: Baruch Goldmann, MD;  Location: [facility];  Service: Ophthalmology;  Laterality: Right;  right CDE=17.03   CHOLECYSTECTOMY     COLONOSCOPY  2009   Dr. Sharlett Iles: Normal   ESOPHAGOGASTRODUODENOSCOPY  07/13/2011   small hh, patchy gastric erythema with scarring deformity of antrum, chronic gastritis, bx benign with no H.Pylori. Procedure: ESOPHAGOGASTRODUODENOSCOPY (EGD);  Surgeon: Daneil Dolin, MD;  [facility];  Service: Endoscopy;  Laterality: N/A;  10:45   ESOPHAGOGASTRODUODENOSCOPY N/A 12/18/2018   Dr. Gala Romney: normal esophagus s/p dilation, small hh, erythema in stomach with benign biopsy.    IMPLANTABLE CARDIOVERTER DEFIBRILLATOR IMPLANT N/A 10/31/2013   Procedure: IMPLANTABLE CARDIOVERTER DEFIBRILLATOR IMPLANT;  Surgeon: Evans Lance, MD;  Location: [facility] CATH LAB;  Service: Cardiovascular;  Laterality: N/A;   IR RADIOLOGIST EVAL & MGMT  03/16/2022   MALONEY DILATION N/A 12/18/2018   Procedure: Venia Minks DILATION;  Surgeon: Daneil Dolin, MD;  [facility];  Service: Endoscopy;  Laterality: N/A;   MANDIBLE SURGERY     secondary to malocclusion   THYROIDECTOMY     neoplasm   Ureteral Surgery  1970    Past Family History   Family History  Problem Relation Age of Onset   Brain cancer Mother    Heart disease Mother    Hypertension Mother    Stroke Mother    Atrial fibrillation Other    Stroke Brother    Colon cancer Neg Hx     Past Social History   Social History   Socioeconomic History   Marital status: Widowed    Spouse name: Not on file   Number of children: 2   Years of education: Not on file   Highest education level: Not on file  Occupational History   Occupation: retired  Tobacco Use   Smoking status: Never   Smokeless  tobacco: Never  Vaping Use   Vaping Use: Never used  Substance and Sexual Activity   Alcohol use: No    Alcohol/week: 0.0 standard drinks of alcohol   Drug use: No   Sexual activity: Not Currently  Other Topics Concern   Not on file  Social History Narrative   Widowed         Social Determinants of Health   Financial Resource Strain: Not on file  Food Insecurity: Not on file  Transportation Needs: Not on file  Physical Activity: Not on file  Stress: Not  on file  Social Connections: Not on file  Intimate Partner Violence: Not on file    Review of Systems   General: Negative for anorexia, weight loss, fever, chills, fatigue, weakness. ENT: Negative for hoarseness, difficulty swallowing , nasal congestion. CV: Negative for chest pain, angina, palpitations, dyspnea on exertion, peripheral edema.  Respiratory: Negative for dyspnea at rest, dyspnea on exertion, cough, sputum, wheezing.  GI: See history of present illness. GU:  Negative for dysuria, hematuria, urinary incontinence, urinary frequency, nocturnal urination.  Endo: Negative for unusual weight change.     Physical Exam   There were no vitals taken for this visit.   General: Well-nourished, well-developed in no acute distress.  Eyes: No icterus. Mouth: Oropharyngeal mucosa moist and pink , no lesions erythema or exudate. Lungs: Clear to auscultation bilaterally.  Heart: Regular rate and rhythm, no murmurs rubs or gallops.  Abdomen: Bowel sounds are normal, nontender, nondistended, no hepatosplenomegaly or masses,  no abdominal bruits or hernia , no rebound or guarding.  Rectal: ***  Extremities: No lower extremity edema. No clubbing or deformities. Neuro: Alert and oriented x 4   Skin: Warm and dry, no jaundice.   Psych: Alert and cooperative, normal mood and affect.  Labs   *** Imaging Studies   No results found.  Assessment       PLAN   ***   Laureen Ochs. Bobby Rumpf, [place], [facility]

## 2022-10-03 ENCOUNTER — Ambulatory Visit: Payer: Medicare HMO | Admitting: Gastroenterology

## 2022-10-03 ENCOUNTER — Encounter: Payer: Self-pay | Admitting: Gastroenterology

## 2022-10-03 VITALS — BP 130/68 | HR 84 | Temp 97.7°F | Ht 66.0 in | Wt 234.6 lb

## 2022-10-03 DIAGNOSIS — R109 Unspecified abdominal pain: Secondary | ICD-10-CM | POA: Insufficient documentation

## 2022-10-03 DIAGNOSIS — R1084 Generalized abdominal pain: Secondary | ICD-10-CM

## 2022-10-03 DIAGNOSIS — R110 Nausea: Secondary | ICD-10-CM | POA: Diagnosis not present

## 2022-10-03 DIAGNOSIS — K5900 Constipation, unspecified: Secondary | ICD-10-CM

## 2022-10-03 MED ORDER — LUBIPROSTONE 24 MCG PO CAPS: ORAL_CAPSULE | ORAL | 3 refills | Status: DC

## 2022-10-03 NOTE — Patient Instructions (Addendum)
Continue lansoprazole 30mg  daily before breakfast. Trial of lubiprostone one every morning with breakfast for constipation, may take second with supper if needed. Take every day to regulate your bowel movements. Call with any questions or concerns. Return office visit in 6 weeks.

## 2022-10-13 ENCOUNTER — Ambulatory Visit (INDEPENDENT_AMBULATORY_CARE_PROVIDER_SITE_OTHER): Payer: Medicare HMO

## 2022-10-13 DIAGNOSIS — I428 Other cardiomyopathies: Secondary | ICD-10-CM

## 2022-10-13 LAB — CUP PACEART REMOTE DEVICE CHECK
Battery Remaining Longevity: 104 mo
Battery Voltage: 3 V
Brady Statistic AP VP Percent: 71.23 %
Brady Statistic AP VS Percent: 0.2 %
Brady Statistic AS VP Percent: 20.63 %
Brady Statistic AS VS Percent: 7.95 %
Brady Statistic RA Percent Paced: 76.84 %
Brady Statistic RV Percent Paced: 89.26 %
Date Time Interrogation Session: 20240329052237
Implantable Lead Connection Status: 753985
Implantable Lead Connection Status: 753985
Implantable Lead Connection Status: 753985
Implantable Lead Implant Date: 20150417
Implantable Lead Implant Date: 20150417
Implantable Lead Implant Date: 20150417
Implantable Lead Location: 753858
Implantable Lead Location: 753859
Implantable Lead Location: 753860
Implantable Lead Model: 4298
Implantable Lead Model: 5076
Implantable Lead Model: 6935
Implantable Pulse Generator Implant Date: 20220603
Lead Channel Impedance Value: 304 Ohm
Lead Channel Impedance Value: 304 Ohm
Lead Channel Impedance Value: 304 Ohm
Lead Channel Impedance Value: 304 Ohm
Lead Channel Impedance Value: 323 Ohm
Lead Channel Impedance Value: 323 Ohm
Lead Channel Impedance Value: 323 Ohm
Lead Channel Impedance Value: 342 Ohm
Lead Channel Impedance Value: 437 Ohm
Lead Channel Impedance Value: 494 Ohm
Lead Channel Impedance Value: 513 Ohm
Lead Channel Impedance Value: 513 Ohm
Lead Channel Impedance Value: 551 Ohm
Lead Channel Impedance Value: 570 Ohm
Lead Channel Pacing Threshold Amplitude: 0.625 V
Lead Channel Pacing Threshold Amplitude: 0.75 V
Lead Channel Pacing Threshold Amplitude: 1.125 V
Lead Channel Pacing Threshold Pulse Width: 0.4 ms
Lead Channel Pacing Threshold Pulse Width: 0.4 ms
Lead Channel Pacing Threshold Pulse Width: 0.6 ms
Lead Channel Sensing Intrinsic Amplitude: 1.75 mV
Lead Channel Sensing Intrinsic Amplitude: 1.75 mV
Lead Channel Sensing Intrinsic Amplitude: 11 mV
Lead Channel Sensing Intrinsic Amplitude: 11 mV
Lead Channel Setting Pacing Amplitude: 1.5 V
Lead Channel Setting Pacing Amplitude: 1.75 V
Lead Channel Setting Pacing Amplitude: 2 V
Lead Channel Setting Pacing Pulse Width: 0.4 ms
Lead Channel Setting Pacing Pulse Width: 0.6 ms
Lead Channel Setting Sensing Sensitivity: 0.9 mV
Zone Setting Status: 755011

## 2022-10-16 ENCOUNTER — Other Ambulatory Visit: Payer: Self-pay | Admitting: Internal Medicine

## 2022-11-06 ENCOUNTER — Other Ambulatory Visit: Payer: Self-pay | Admitting: Cardiology

## 2022-11-13 NOTE — Progress Notes (Unsigned)
GI Office Note    Referring Provider: Benita Stabile, MD Primary Care Physician:  Benita Stabile, MD  Primary Gastroenterologist: Roetta Sessions, MD   Chief Complaint   No chief complaint on file.   History of Present Illness   [name] is a 82 y.o. female presenting today for follow-up. Seen in March.  She has a history of GERD, constipation, nausea, and dysphagia.     Labs***   Medications   Current Outpatient Medications  Medication Sig Dispense Refill   benazepril (LOTENSIN) 40 MG tablet Take 1 tablet by mouth once daily 90 tablet 2   carvedilol (COREG) 25 MG tablet TAKE 1 & 1/2 TABLET BY MOUTH TWICE A DAY WITH A MEAL. 270 tablet 1   cholecalciferol (VITAMIN D) 1000 UNITS tablet Take 1,000 Units by mouth daily.     fexofenadine (ALLEGRA) 180 MG tablet Take 180 mg by mouth every morning.     fluticasone (FLONASE) 50 MCG/ACT nasal spray Place 2 sprays into both nostrils daily as needed for allergies. 16 g 0   furosemide (LASIX) 40 MG tablet Take 1 tablet (40 mg total) by mouth 2 (two) times daily. as directed 60 tablet 6   hydrALAZINE (APRESOLINE) 25 MG tablet TAKE 1 TABLET BY MOUTH THREE TIMES DAILY 270 tablet 0   lansoprazole (PREVACID) 30 MG capsule TAKE 1 CAPSULE BY MOUTH BEFORE BREAKFAST 90 capsule 3   lubiprostone (AMITIZA) 24 MCG capsule Take 24 mcg once to twice daily with food for constipation. 60 capsule 3   ondansetron (ZOFRAN) 4 MG tablet Take 1 tablet (4 mg total) by mouth every 8 (eight) hours as needed for nausea or vomiting. 20 tablet 0   RESTASIS 0.05 % ophthalmic emulsion Place one drop in both eyes daily as needed     rivaroxaban (XARELTO) 20 MG TABS tablet TAKE 1 TABLET BY MOUTH ONCE DAILY WITH SUPPER 90 tablet 1   SYNTHROID 125 MCG tablet Take 1 tablet by mouth once daily (Patient taking differently: Take 125 mcg by mouth daily before breakfast.) 30 tablet 11   traMADol (ULTRAM) 50 MG tablet Take 1 tablet (50 mg total) by mouth every 6 (six)  hours as needed for moderate pain. (Patient taking differently: Take 50 mg by mouth every 12 (twelve) hours as needed for moderate pain or severe pain.) 30 tablet 0   No current facility-administered medications for this visit.    Allergies   Allergies as of 11/14/2022 - Review Complete 10/03/2022  Allergen Reaction Noted   Ciprofloxacin Cough 10/17/2013   Dexlansoprazole  09/13/2011   Statins Other (See Comments) 05/30/2012   Amoxicillin Rash 07/14/2019     Past Medical History   Past Medical History:  Diagnosis Date   Allergy    Anemia    Atrial fibrillation (HCC)    Cardiomyopathy, nonischemic (HCC)    a. EF previously 35% in 2008 with cath showing no significant CAD b. EF improved to 55-60% in 02/2020   Cataract    Cerebrovascular accident [facility])    No clinical event; asymptomatic CT finding   CHF (congestive heart failure) (HCC)    Chronic kidney disease    COPD (chronic obstructive pulmonary disease) (HCC)    DDD (degenerative disc disease), cervical    Cervical   Degenerative joint disease    Right hip and knee   Essential hypertension    GERD (gastroesophageal reflux disease)    Hip dislocation, right (HCC)    Hyperlipemia  Hypothyroid    LBBB (left bundle branch block)    Pacemaker    S/P colonoscopy 2009   Dr. Jarold Motto: reportedly normal per pt   S/P endoscopy 1980s    Past Surgical History   Past Surgical History:  Procedure Laterality Date   ABDOMINAL HYSTERECTOMY     APPENDECTOMY     BI-VENTRICULAR IMPLANTABLE CARDIOVERTER DEFIBRILLATOR  (CRT-D)  10-31-2013   MDT Ovidio Kin CRTD implanted by Dr Ladona Ridgel   BIOPSY  12/18/2018   Procedure: BIOPSY;  Surgeon: Corbin Ade, MD;  [facility];  Service: Endoscopy;;  Gastric    BIV PACEMAKER INSERTION CRT-P N/A 12/17/2020   Procedure: DOWNGRAD TO BIV PACEMAKER INSERTION CRT-P;  Surgeon: Marinus Maw, MD;  Location: [facility] INVASIVE CV LAB;  Service: Cardiovascular;  Laterality: N/A;   BREAST  BIOPSY     bilaterally; benign   can not have MRI due to jaw surgery     CATARACT EXTRACTION W/PHACO Left 03/28/2021   Procedure: CATARACT EXTRACTION PHACO AND INTRAOCULAR LENS PLACEMENT (IOC);  Surgeon: Fabio Pierce, MD;  Location: [facility] ORS;  Service: Ophthalmology;  Laterality: Left;  CDE  17.17   CATARACT EXTRACTION W/PHACO Right 04/11/2021   Procedure: CATARACT EXTRACTION PHACO AND INTRAOCULAR LENS PLACEMENT RIGHT EYE;  Surgeon: Fabio Pierce, MD;  Location: [facility];  Service: Ophthalmology;  Laterality: Right;  right CDE=17.03   CHOLECYSTECTOMY     COLONOSCOPY  2009   Dr. Jarold Motto: Normal   ESOPHAGOGASTRODUODENOSCOPY  07/13/2011   small hh, patchy gastric erythema with scarring deformity of antrum, chronic gastritis, bx benign with no H.Pylori. Procedure: ESOPHAGOGASTRODUODENOSCOPY (EGD);  Surgeon: Corbin Ade, MD;  [facility];  Service: Endoscopy;  Laterality: N/A;  10:45   ESOPHAGOGASTRODUODENOSCOPY N/A 12/18/2018   Dr. Jena Gauss: normal esophagus s/p dilation, small hh, erythema in stomach with benign biopsy.    IMPLANTABLE CARDIOVERTER DEFIBRILLATOR IMPLANT N/A 10/31/2013   Procedure: IMPLANTABLE CARDIOVERTER DEFIBRILLATOR IMPLANT;  Surgeon: Marinus Maw, MD;  Location: [facility] CATH LAB;  Service: Cardiovascular;  Laterality: N/A;   IR RADIOLOGIST EVAL & MGMT  03/16/2022   MALONEY DILATION N/A 12/18/2018   Procedure: Elease Hashimoto DILATION;  Surgeon: Corbin Ade, MD;  Location[facility];  Service: Endoscopy;  Laterality: N/A;   MANDIBLE SURGERY     secondary to malocclusion   THYROIDECTOMY     neoplasm   Ureteral Surgery  1970    Past Family History   Family History  Problem Relation Age of Onset   Brain cancer Mother    Heart disease Mother    Hypertension Mother    Stroke Mother    Atrial fibrillation Other    Stroke Brother    Colon cancer Neg Hx     Past Social History   Social History   Socioeconomic History   Marital status: Widowed    Spouse name:  Not on file   Number of children: 2   Years of education: Not on file   Highest education level: Not on file  Occupational History   Occupation: retired  Tobacco Use   Smoking status: Never   Smokeless tobacco: Never  Vaping Use   Vaping Use: Never used  Substance and Sexual Activity   Alcohol use: No    Alcohol/week: 0.0 standard drinks of alcohol   Drug use: No   Sexual activity: Not Currently  Other Topics Concern   Not on file  Social History Narrative   Widowed  Social Determinants of Health   Financial Resource Strain: Not on file  Food Insecurity: Not on file  Transportation Needs: Not on file  Physical Activity: Not on file  Stress: Not on file  Social Connections: Not on file  Intimate Partner Violence: Not on file    Review of Systems   General: Negative for anorexia, weight loss, fever, chills, fatigue, weakness. ENT: Negative for hoarseness, difficulty swallowing , nasal congestion. CV: Negative for chest pain, angina, palpitations, dyspnea on exertion, peripheral edema.  Respiratory: Negative for dyspnea at rest, dyspnea on exertion, cough, sputum, wheezing.  GI: See history of present illness. GU:  Negative for dysuria, hematuria, urinary incontinence, urinary frequency, nocturnal urination.  Endo: Negative for unusual weight change.     Physical Exam   There were no vitals taken for this visit.   General: Well-nourished, well-developed in no acute distress.  Eyes: No icterus. Mouth: Oropharyngeal mucosa moist and pink , no lesions erythema or exudate. Lungs: Clear to auscultation bilaterally.  Heart: Regular rate and rhythm, no murmurs rubs or gallops.  Abdomen: Bowel sounds are normal, nontender, nondistended, no hepatosplenomegaly or masses,  no abdominal bruits or hernia , no rebound or guarding.  Rectal: ***  Extremities: No lower extremity edema. No clubbing or deformities. Neuro: Alert and oriented x 4   Skin: Warm and dry, no  jaundice.   Psych: Alert and cooperative, normal mood and affect.  Labs   ***  Imaging Studies   No results found.  Assessment       PLAN   ***   Leanna Battles. Melvyn Neth, MHS, PA-C [facility]

## 2022-11-14 ENCOUNTER — Ambulatory Visit: Payer: Medicare HMO | Admitting: Gastroenterology

## 2022-11-14 ENCOUNTER — Encounter: Payer: Self-pay | Admitting: Gastroenterology

## 2022-11-14 VITALS — BP 127/74 | HR 63 | Temp 97.7°F | Ht 66.0 in | Wt 244.4 lb

## 2022-11-14 DIAGNOSIS — K5900 Constipation, unspecified: Secondary | ICD-10-CM

## 2022-11-14 NOTE — Patient Instructions (Addendum)
Stop amitiza.  Start miralax one capful twice daily until soft stool, then continue once daily. If you need a laxative on occasion, you can use bisacodyl 5mg  or sennosides 8.6mg  per package instructions. Call in 2-3 weeks and let me know how you are doing. You can call my CMA, Tammy, at [phone].  If you have abdominal pain, please let me know and we would consider doing the CT scan of your abdomen.

## 2022-11-15 NOTE — Progress Notes (Signed)
Remote pacemaker transmission.   

## 2022-11-28 ENCOUNTER — Other Ambulatory Visit: Payer: Self-pay | Admitting: Cardiology

## 2022-12-12 DIAGNOSIS — E782 Mixed hyperlipidemia: Secondary | ICD-10-CM | POA: Diagnosis not present

## 2022-12-12 DIAGNOSIS — E559 Vitamin D deficiency, unspecified: Secondary | ICD-10-CM | POA: Diagnosis not present

## 2022-12-12 DIAGNOSIS — R7303 Prediabetes: Secondary | ICD-10-CM | POA: Diagnosis not present

## 2022-12-12 DIAGNOSIS — E039 Hypothyroidism, unspecified: Secondary | ICD-10-CM | POA: Diagnosis not present

## 2022-12-18 DIAGNOSIS — I429 Cardiomyopathy, unspecified: Secondary | ICD-10-CM | POA: Diagnosis not present

## 2022-12-18 DIAGNOSIS — N1831 Chronic kidney disease, stage 3a: Secondary | ICD-10-CM | POA: Diagnosis not present

## 2022-12-18 DIAGNOSIS — I130 Hypertensive heart and chronic kidney disease with heart failure and stage 1 through stage 4 chronic kidney disease, or unspecified chronic kidney disease: Secondary | ICD-10-CM | POA: Diagnosis not present

## 2022-12-18 DIAGNOSIS — E782 Mixed hyperlipidemia: Secondary | ICD-10-CM | POA: Diagnosis not present

## 2022-12-18 DIAGNOSIS — I700 Atherosclerosis of aorta: Secondary | ICD-10-CM | POA: Diagnosis not present

## 2022-12-18 DIAGNOSIS — I5022 Chronic systolic (congestive) heart failure: Secondary | ICD-10-CM | POA: Diagnosis not present

## 2022-12-18 DIAGNOSIS — I10 Essential (primary) hypertension: Secondary | ICD-10-CM | POA: Diagnosis not present

## 2022-12-18 DIAGNOSIS — I482 Chronic atrial fibrillation, unspecified: Secondary | ICD-10-CM | POA: Diagnosis not present

## 2022-12-20 ENCOUNTER — Encounter: Payer: Self-pay | Admitting: Cardiology

## 2022-12-20 ENCOUNTER — Ambulatory Visit: Payer: Medicare HMO | Attending: Cardiology | Admitting: Cardiology

## 2022-12-20 VITALS — BP 136/82 | HR 68 | Ht 66.0 in | Wt 236.2 lb

## 2022-12-20 DIAGNOSIS — I480 Paroxysmal atrial fibrillation: Secondary | ICD-10-CM | POA: Diagnosis not present

## 2022-12-20 DIAGNOSIS — I428 Other cardiomyopathies: Secondary | ICD-10-CM | POA: Diagnosis not present

## 2022-12-20 DIAGNOSIS — I890 Lymphedema, not elsewhere classified: Secondary | ICD-10-CM | POA: Diagnosis not present

## 2022-12-20 DIAGNOSIS — Z8679 Personal history of other diseases of the circulatory system: Secondary | ICD-10-CM

## 2022-12-20 DIAGNOSIS — I10 Essential (primary) hypertension: Secondary | ICD-10-CM

## 2022-12-20 MED ORDER — RIVAROXABAN 20 MG PO TABS: 20.0000 mg | ORAL_TABLET | Freq: Every day | ORAL | 0 refills | Status: DC

## 2022-12-20 NOTE — Addendum Note (Signed)
Addended by: Marlyn Corporal A on: 12/20/2022 02:19 PM   Modules accepted: Orders

## 2022-12-20 NOTE — Patient Instructions (Signed)
Medication Instructions:  Your physician recommends that you continue on your current medications as directed. Please refer to the Current Medication list given to you today.   Labwork: None today  Testing/Procedures: Your physician has requested that you have an echocardiogram. Echocardiography is a painless test that uses sound waves to create images of your heart. It provides your doctor with information about the size and shape of your heart and how well your heart's chambers and valves are working. This procedure takes approximately one hour. There are no restrictions for this procedure. Please do NOT wear cologne, perfume, aftershave, or lotions (deodorant is allowed). Please arrive 15 minutes prior to your appointment time.   Follow-Up: January 2025 Dr.McDowell  Any Other Special Instructions Will Be Listed Below (If Applicable).  If you need a refill on your cardiac medications before your next appointment, please call your pharmacy.

## 2022-12-20 NOTE — Progress Notes (Signed)
    Cardiology Office Note  Date: 12/20/2022   ID: Melana, Robichaux 1941-06-30, MRN [id number]  History of Present Illness: [name] is an 82 y.o. female last seen in November 2023.  She is here for a routine visit.  Reports no progressive sense of palpitations and stable NYHA class II dyspnea.  Lymphedema has been relatively stable as well, she is on diuretics and also has mechanical compression available at home on an as-needed basis.  Medtronic biventricular pacemaker in place with follow-up with Dr. Ladona Ridgel.  Device interrogation in March revealed overall normal function.  We discussed getting a follow-up echocardiogram, her last study was in August 2021.  I went over her lab work.  Physical Exam: VS:  BP 136/82   Pulse 68   Ht 5\' 6"  (1.676 m)   Wt 236 lb 3.2 oz (107.1 kg)   SpO2 97%   BMI 38.12 kg/m , BMI Body mass index is 38.12 kg/m.  Wt Readings from Last 3 Encounters:  12/20/22 236 lb 3.2 oz (107.1 kg)  11/14/22 244 lb 6.4 oz (110.9 kg)  10/03/22 234 lb 9.6 oz (106.4 kg)    General: Patient appears comfortable at rest. HEENT: Conjunctiva and lids normal. Neck: Supple, no elevated JVP or carotid bruits. Lungs: Clear to auscultation, nonlabored breathing at rest. Cardiac: Regular rate and rhythm, no S3 or significant systolic murmur. Extremities: Chronic appearing lymphedema.  ECG:  An ECG dated 03/09/2022 was personally reviewed today and demonstrated:  Biventricular pacing.  Labwork:  May 2024: TSH 0.66, hemoglobin 11.7, platelets 169, BUN 24, creatinine 1.31, potassium 4.6, AST 16, ALT 9, cholesterol 223, triglycerides 67, HDL 45, LDL 166, hemoglobin A1c 6.3%  Other Studies Reviewed Today:  No interval cardiac testing for review today.  Assessment and Plan:  1.  HFrecEF with history of nonischemic cardiomyopathy and normalization of LVEF in the range of 55 to 60% by echocardiogram in August 2021.  She is symptomatically stable with NYHA class II  dyspnea.  Plan to update echocardiogram.  Otherwise continue Coreg, Lotensin, and Lasix at this point.  2.  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 Ladona Ridgel.  3.  Paroxysmal atrial fibrillation with CHA2DS2-VASc score of 7.  Creatinine clearance 58 based on most recent lab work.  Plan to continue current dose of Xarelto, no spontaneous bleeding problems reported.  She does not describe any progressive sense of palpitations.  4.  Chronic lymphedema.  On Lasix and has as needed mechanical compression available at home.  5.  Essential hypertension.  Blood pressure control is adequate today.  Disposition:  Follow up  6 months.  Signed, Jonelle Sidle, M.D., F.A.C.C. [facility]

## 2023-01-03 ENCOUNTER — Other Ambulatory Visit: Payer: Self-pay | Admitting: Cardiology

## 2023-01-03 DIAGNOSIS — I5023 Acute on chronic systolic (congestive) heart failure: Secondary | ICD-10-CM

## 2023-01-12 ENCOUNTER — Ambulatory Visit: Payer: Medicare HMO | Attending: Internal Medicine

## 2023-01-12 DIAGNOSIS — I428 Other cardiomyopathies: Secondary | ICD-10-CM

## 2023-01-12 LAB — CUP PACEART REMOTE DEVICE CHECK
Battery Remaining Longevity: 103 mo
Battery Voltage: 3 V
Brady Statistic AP VP Percent: 70.13 %
Brady Statistic AP VS Percent: 0.25 %
Brady Statistic AS VP Percent: 20.62 %
Brady Statistic AS VS Percent: 9.01 %
Brady Statistic RA Percent Paced: 76.58 %
Brady Statistic RV Percent Paced: 86.99 %
Date Time Interrogation Session: 20240628010622
Implantable Lead Connection Status: 753985
Implantable Lead Connection Status: 753985
Implantable Lead Connection Status: 753985
Implantable Lead Implant Date: 20150417
Implantable Lead Implant Date: 20150417
Implantable Lead Implant Date: 20150417
Implantable Lead Location: 753858
Implantable Lead Location: 753859
Implantable Lead Location: 753860
Implantable Lead Model: 4298
Implantable Lead Model: 5076
Implantable Lead Model: 6935
Implantable Pulse Generator Implant Date: 20220603
Lead Channel Impedance Value: 323 Ohm
Lead Channel Impedance Value: 323 Ohm
Lead Channel Impedance Value: 342 Ohm
Lead Channel Impedance Value: 342 Ohm
Lead Channel Impedance Value: 342 Ohm
Lead Channel Impedance Value: 342 Ohm
Lead Channel Impedance Value: 380 Ohm
Lead Channel Impedance Value: 380 Ohm
Lead Channel Impedance Value: 418 Ohm
Lead Channel Impedance Value: 570 Ohm
Lead Channel Impedance Value: 589 Ohm
Lead Channel Impedance Value: 627 Ohm
Lead Channel Impedance Value: 627 Ohm
Lead Channel Impedance Value: 627 Ohm
Lead Channel Pacing Threshold Amplitude: 0.625 V
Lead Channel Pacing Threshold Amplitude: 0.875 V
Lead Channel Pacing Threshold Amplitude: 1.125 V
Lead Channel Pacing Threshold Pulse Width: 0.4 ms
Lead Channel Pacing Threshold Pulse Width: 0.4 ms
Lead Channel Pacing Threshold Pulse Width: 0.6 ms
Lead Channel Sensing Intrinsic Amplitude: 1.5 mV
Lead Channel Sensing Intrinsic Amplitude: 1.5 mV
Lead Channel Sensing Intrinsic Amplitude: 9.625 mV
Lead Channel Sensing Intrinsic Amplitude: 9.625 mV
Lead Channel Setting Pacing Amplitude: 1.5 V
Lead Channel Setting Pacing Amplitude: 1.75 V
Lead Channel Setting Pacing Amplitude: 2 V
Lead Channel Setting Pacing Pulse Width: 0.4 ms
Lead Channel Setting Pacing Pulse Width: 0.6 ms
Lead Channel Setting Sensing Sensitivity: 0.9 mV
Zone Setting Status: 755011

## 2023-01-25 NOTE — Progress Notes (Signed)
Remote pacemaker transmission.   

## 2023-02-02 ENCOUNTER — Ambulatory Visit (HOSPITAL_COMMUNITY)
Admission: RE | Admit: 2023-02-02 | Discharge: 2023-02-02 | Disposition: A | Discharge status: Home / Self Care | Payer: Medicare HMO | Source: Ambulatory Visit | Attending: Cardiology | Admitting: Cardiology

## 2023-02-02 DIAGNOSIS — E785 Hyperlipidemia, unspecified: Secondary | ICD-10-CM | POA: Diagnosis not present

## 2023-02-02 DIAGNOSIS — I517 Cardiomegaly: Secondary | ICD-10-CM | POA: Diagnosis not present

## 2023-02-02 DIAGNOSIS — I110 Hypertensive heart disease with heart failure: Secondary | ICD-10-CM | POA: Diagnosis not present

## 2023-02-02 DIAGNOSIS — I371 Nonrheumatic pulmonary valve insufficiency: Secondary | ICD-10-CM | POA: Diagnosis not present

## 2023-02-02 DIAGNOSIS — I5030 Unspecified diastolic (congestive) heart failure: Secondary | ICD-10-CM

## 2023-02-02 DIAGNOSIS — I351 Nonrheumatic aortic (valve) insufficiency: Secondary | ICD-10-CM | POA: Insufficient documentation

## 2023-02-02 DIAGNOSIS — I429 Cardiomyopathy, unspecified: Secondary | ICD-10-CM | POA: Diagnosis not present

## 2023-02-02 DIAGNOSIS — Z950 Presence of cardiac pacemaker: Secondary | ICD-10-CM | POA: Diagnosis not present

## 2023-02-02 DIAGNOSIS — Z8679 Personal history of other diseases of the circulatory system: Secondary | ICD-10-CM | POA: Insufficient documentation

## 2023-02-02 DIAGNOSIS — I509 Heart failure, unspecified: Secondary | ICD-10-CM | POA: Diagnosis not present

## 2023-02-02 DIAGNOSIS — R9431 Abnormal electrocardiogram [ECG] [EKG]: Secondary | ICD-10-CM | POA: Diagnosis not present

## 2023-02-02 DIAGNOSIS — J449 Chronic obstructive pulmonary disease, unspecified: Secondary | ICD-10-CM | POA: Diagnosis not present

## 2023-02-02 DIAGNOSIS — I088 Other rheumatic multiple valve diseases: Secondary | ICD-10-CM

## 2023-02-02 LAB — ECHOCARDIOGRAM COMPLETE
Area-P 1/2: 2.38 cm2
Calc EF: 40.6 %
S' Lateral: 3.3 cm
Single Plane A2C EF: 41.4 %
Single Plane A4C EF: 42.8 %

## 2023-02-02 NOTE — Progress Notes (Signed)
  Echocardiogram 2D Echocardiogram has been performed.  Janalyn Harder 02/02/2023, 2:03 PM

## 2023-03-15 ENCOUNTER — Ambulatory Visit: Payer: Medicare HMO | Attending: Internal Medicine | Admitting: Internal Medicine

## 2023-03-15 ENCOUNTER — Encounter: Payer: Self-pay | Admitting: Internal Medicine

## 2023-03-15 VITALS — BP 154/80 | HR 63 | Ht 67.0 in | Wt 224.6 lb

## 2023-03-15 DIAGNOSIS — I428 Other cardiomyopathies: Secondary | ICD-10-CM

## 2023-03-15 LAB — CUP PACEART INCLINIC DEVICE CHECK
Battery Remaining Longevity: 102 mo
Battery Voltage: 3 V
Brady Statistic AP VP Percent: 69.58 %
Brady Statistic AP VS Percent: 0.22 %
Brady Statistic AS VP Percent: 21.91 %
Brady Statistic AS VS Percent: 8.28 %
Brady Statistic RA Percent Paced: 75.03 %
Brady Statistic RV Percent Paced: 87.25 %
Date Time Interrogation Session: 20240829113430
Implantable Lead Connection Status: 753985
Implantable Lead Connection Status: 753985
Implantable Lead Connection Status: 753985
Implantable Lead Implant Date: 20150417
Implantable Lead Implant Date: 20150417
Implantable Lead Implant Date: 20150417
Implantable Lead Location: 753858
Implantable Lead Location: 753859
Implantable Lead Location: 753860
Implantable Lead Model: 4298
Implantable Lead Model: 5076
Implantable Lead Model: 6935
Implantable Pulse Generator Implant Date: 20220603
Lead Channel Impedance Value: 323 Ohm
Lead Channel Impedance Value: 342 Ohm
Lead Channel Impedance Value: 361 Ohm
Lead Channel Impedance Value: 361 Ohm
Lead Channel Impedance Value: 380 Ohm
Lead Channel Impedance Value: 399 Ohm
Lead Channel Impedance Value: 399 Ohm
Lead Channel Impedance Value: 418 Ohm
Lead Channel Impedance Value: 456 Ohm
Lead Channel Impedance Value: 627 Ohm
Lead Channel Impedance Value: 646 Ohm
Lead Channel Impedance Value: 646 Ohm
Lead Channel Impedance Value: 665 Ohm
Lead Channel Impedance Value: 684 Ohm
Lead Channel Pacing Threshold Amplitude: 0.5 V
Lead Channel Pacing Threshold Amplitude: 0.75 V
Lead Channel Pacing Threshold Amplitude: 1.125 V
Lead Channel Pacing Threshold Pulse Width: 0.4 ms
Lead Channel Pacing Threshold Pulse Width: 0.4 ms
Lead Channel Pacing Threshold Pulse Width: 0.6 ms
Lead Channel Sensing Intrinsic Amplitude: 1.25 mV
Lead Channel Sensing Intrinsic Amplitude: 1.5 mV
Lead Channel Sensing Intrinsic Amplitude: 10.75 mV
Lead Channel Sensing Intrinsic Amplitude: 10.875 mV
Lead Channel Setting Pacing Amplitude: 1.5 V
Lead Channel Setting Pacing Amplitude: 1.75 V
Lead Channel Setting Pacing Amplitude: 2 V
Lead Channel Setting Pacing Pulse Width: 0.4 ms
Lead Channel Setting Pacing Pulse Width: 0.6 ms
Lead Channel Setting Sensing Sensitivity: 0.9 mV
Zone Setting Status: 755011

## 2023-03-15 NOTE — Progress Notes (Signed)
HPI [name] returns today for followup. She is a pleasant 82 yo woman with a h/o chronic systolic heart failure, obesity, and LBBB who underwent biv ICD insertion several years ago. She reached ERI and underwent gen change with downgrade to a BIV PPM. She denies chest pain. She has class 2 CHF symptoms. She has lymphedema. She denies non-compliance. She has disc problems in her back and is considering kyphoplasty.  Allergies  Allergen Reactions   Ciprofloxacin Cough   Dexlansoprazole     Lower abdominal pain.   Statins Other (See Comments)    Arthralgias during treatment with at least 2 members of his class    Amoxicillin Rash     Current Outpatient Medications  Medication Sig Dispense Refill   benazepril (LOTENSIN) 40 MG tablet Take 1 tablet by mouth once daily 90 tablet 2   carvedilol (COREG) 25 MG tablet TAKE 1 & 1/2 TABLET BY MOUTH TWICE A DAY WITH A MEAL. 270 tablet 1   cholecalciferol (VITAMIN D) 1000 UNITS tablet Take 1,000 Units by mouth daily.     fexofenadine (ALLEGRA) 180 MG tablet Take 180 mg by mouth every morning.     fluticasone (FLONASE) 50 MCG/ACT nasal spray Place 2 sprays into both nostrils daily as needed for allergies. 16 g 0   furosemide (LASIX) 40 MG tablet TAKE 1 TABLET BY MOUTH TWICE DAILY AS DIRECTED 60 tablet 6   hydrALAZINE (APRESOLINE) 25 MG tablet TAKE 1 TABLET BY MOUTH THREE TIMES DAILY 270 tablet 0   lansoprazole (PREVACID) 30 MG capsule TAKE 1 CAPSULE BY MOUTH BEFORE BREAKFAST 90 capsule 3   ondansetron (ZOFRAN) 4 MG tablet Take 1 tablet (4 mg total) by mouth every 8 (eight) hours as needed for nausea or vomiting. 20 tablet 0   RESTASIS 0.05 % ophthalmic emulsion Place one drop in both eyes daily as needed     rivaroxaban (XARELTO) 20 MG TABS tablet TAKE 1 TABLET BY MOUTH ONCE DAILY WITH SUPPER 90 tablet 1   rivaroxaban (XARELTO) 20 MG TABS tablet Take 1 tablet (20 mg total) by mouth daily with supper. 28 tablet 0   SYNTHROID 125 MCG tablet  Take 1 tablet by mouth once daily (Patient taking differently: Take 125 mcg by mouth daily before breakfast.) 30 tablet 11   traMADol (ULTRAM) 50 MG tablet Take 1 tablet (50 mg total) by mouth every 6 (six) hours as needed for moderate pain. (Patient taking differently: Take 50 mg by mouth every 12 (twelve) hours as needed for moderate pain or severe pain.) 30 tablet 0   No current facility-administered medications for this visit.     Past Medical History:  Diagnosis Date   Allergy    Anemia    Atrial fibrillation (HCC)    Cardiomyopathy, nonischemic (HCC)    a. EF previously 35% in 2008 with cath showing no significant CAD b. EF improved to 55-60% in 02/2020   Cataract    Cerebrovascular accident [facility])    No clinical event; asymptomatic CT finding   CHF (congestive heart failure) (HCC)    Chronic kidney disease    COPD (chronic obstructive pulmonary disease) (HCC)    DDD (degenerative disc disease), cervical    Cervical   Degenerative joint disease    Right hip and knee   Essential hypertension    GERD (gastroesophageal reflux disease)    Hip dislocation, right (HCC)    Hyperlipemia    Hypothyroid    LBBB (left bundle branch  block)    Pacemaker    S/P colonoscopy 2009   Dr. Jarold Motto: reportedly normal per pt   S/P endoscopy 1980s    ROS:   All systems reviewed and negative except as noted in the HPI.   Past Surgical History:  Procedure Laterality Date   ABDOMINAL HYSTERECTOMY     APPENDECTOMY     BI-VENTRICULAR IMPLANTABLE CARDIOVERTER DEFIBRILLATOR  (CRT-D)  10-31-2013   MDT Ovidio Kin CRTD implanted by Dr Ladona Ridgel   BIOPSY  12/18/2018   Procedure: BIOPSY;  Surgeon: Corbin Ade, MD;  [facility];  Service: Endoscopy;;  Gastric    BIV PACEMAKER INSERTION CRT-P N/A 12/17/2020   Procedure: DOWNGRAD TO BIV PACEMAKER INSERTION CRT-P;  Surgeon: Marinus Maw, MD;  Location: [facility] INVASIVE CV LAB;  Service: Cardiovascular;  Laterality: N/A;   BREAST BIOPSY      bilaterally; benign   can not have MRI due to jaw surgery     CATARACT EXTRACTION W/PHACO Left 03/28/2021   Procedure: CATARACT EXTRACTION PHACO AND INTRAOCULAR LENS PLACEMENT (IOC);  Surgeon: Fabio Pierce, MD;  Location: [facility];  Service: Ophthalmology;  Laterality: Left;  CDE  17.17   CATARACT EXTRACTION W/PHACO Right 04/11/2021   Procedure: CATARACT EXTRACTION PHACO AND INTRAOCULAR LENS PLACEMENT RIGHT EYE;  Surgeon: Fabio Pierce, MD;  Location: [facility];  Service: Ophthalmology;  Laterality: Right;  right CDE=17.03   CHOLECYSTECTOMY     COLONOSCOPY  2009   Dr. Jarold Motto: Normal   ESOPHAGOGASTRODUODENOSCOPY  07/13/2011   small hh, patchy gastric erythema with scarring deformity of antrum, chronic gastritis, bx benign with no H.Pylori. Procedure: ESOPHAGOGASTRODUODENOSCOPY (EGD);  Surgeon: Corbin Ade, MD;  [facility];  Service: Endoscopy;  Laterality: N/A;  10:45   ESOPHAGOGASTRODUODENOSCOPY N/A 12/18/2018   Dr. Jena Gauss: normal esophagus s/p dilation, small hh, erythema in stomach with benign biopsy.    IMPLANTABLE CARDIOVERTER DEFIBRILLATOR IMPLANT N/A 10/31/2013   Procedure: IMPLANTABLE CARDIOVERTER DEFIBRILLATOR IMPLANT;  Surgeon: Marinus Maw, MD;  Location: [facility] CATH LAB;  Service: Cardiovascular;  Laterality: N/A;   IR RADIOLOGIST EVAL & MGMT  03/16/2022   MALONEY DILATION N/A 12/18/2018   Procedure: Elease Hashimoto DILATION;  Surgeon: Corbin Ade, MD;  [facility];  Service: Endoscopy;  Laterality: N/A;   MANDIBLE SURGERY     secondary to malocclusion   THYROIDECTOMY     neoplasm   Ureteral Surgery  1970     Family History  Problem Relation Age of Onset   Brain cancer Mother    Heart disease Mother    Hypertension Mother    Stroke Mother    Atrial fibrillation Other    Stroke Brother    Colon cancer Neg Hx      Social History   Socioeconomic History   Marital status: Widowed    Spouse name: Not on file   Number of children: 2   Years of  education: Not on file   Highest education level: Not on file  Occupational History   Occupation: retired  Tobacco Use   Smoking status: Never   Smokeless tobacco: Never  Vaping Use   Vaping status: Never Used  Substance and Sexual Activity   Alcohol use: No    Alcohol/week: 0.0 standard drinks of alcohol   Drug use: No   Sexual activity: Not Currently  Other Topics Concern   Not on file  Social History Narrative   Widowed         Social Determinants of  Health   Financial Resource Strain: Not on file  Food Insecurity: Not on file  Transportation Needs: Not on file  Physical Activity: Not on file  Stress: Not on file  Social Connections: Not on file  Intimate Partner Violence: Not on file     BP (!) 154/80   Pulse 63   Ht 5\' 7"  (1.702 m)   Wt 224 lb 9.6 oz (101.9 kg)   SpO2 95%   BMI 35.18 kg/m   Physical Exam:  Well appearing NAD HEENT: Unremarkable Neck:  No JVD, no thyromegally Lymphatics:  No adenopathy Back:  No CVA tenderness Lungs:  Clear HEART:  Regular rate rhythm, no murmurs, no rubs, no clicks Abd:  soft, positive bowel sounds, no organomegally, no rebound, no guarding Ext:  2 plus pulses, no edema, no cyanosis, no clubbing Skin:  No rashes no nodules Neuro:  CN II through XII intact, motor grossly intact  EKG  DEVICE  Normal device function.  See PaceArt for details.   Assess/Plan:  Chronic systolic heart failure - her symptoms remain class 2. We will follow. Her EF has near normalized with biv pacing Obesity - she is encouraged to maintain a low fat diet. HTN - her bp is a little elevated. She will continue her current meds. Ideally we could switch to [facility] but shes concerned about the price.   4. Atrial fib - she is maintaining NSR. Continue xarelto.  Sharlot Gowda Vihan Santagata,MD

## 2023-03-15 NOTE — Patient Instructions (Signed)

## 2023-03-20 ENCOUNTER — Other Ambulatory Visit: Payer: Self-pay | Admitting: Cardiology

## 2023-03-20 NOTE — Telephone Encounter (Signed)
Prescription refill request for Xarelto received.  Indication: a fib Last office visit: 03/15/23 Weight: 224# Age: 82 Scr: 1.31 [facility] 12/12/22 CrCl: 53/ mL/min

## 2023-03-21 ENCOUNTER — Other Ambulatory Visit (HOSPITAL_COMMUNITY): Payer: Self-pay | Admitting: Interventional Radiology

## 2023-03-21 DIAGNOSIS — M5440 Lumbago with sciatica, unspecified side: Secondary | ICD-10-CM

## 2023-03-29 DIAGNOSIS — H52223 Regular astigmatism, bilateral: Secondary | ICD-10-CM | POA: Diagnosis not present

## 2023-04-05 ENCOUNTER — Other Ambulatory Visit: Payer: Self-pay | Admitting: Cardiology

## 2023-04-13 ENCOUNTER — Ambulatory Visit: Payer: Medicare HMO

## 2023-04-13 DIAGNOSIS — E782 Mixed hyperlipidemia: Secondary | ICD-10-CM | POA: Diagnosis not present

## 2023-04-13 DIAGNOSIS — Z8679 Personal history of other diseases of the circulatory system: Secondary | ICD-10-CM | POA: Diagnosis not present

## 2023-04-13 DIAGNOSIS — E039 Hypothyroidism, unspecified: Secondary | ICD-10-CM | POA: Diagnosis not present

## 2023-04-13 DIAGNOSIS — E559 Vitamin D deficiency, unspecified: Secondary | ICD-10-CM | POA: Diagnosis not present

## 2023-04-13 DIAGNOSIS — R7303 Prediabetes: Secondary | ICD-10-CM | POA: Diagnosis not present

## 2023-04-14 LAB — CUP PACEART REMOTE DEVICE CHECK
Battery Remaining Longevity: 99 mo
Battery Voltage: 3 V
Brady Statistic AP VP Percent: 72.89 %
Brady Statistic AP VS Percent: 0.27 %
Brady Statistic AS VP Percent: 17.33 %
Brady Statistic AS VS Percent: 9.52 %
Brady Statistic RA Percent Paced: 78.24 %
Brady Statistic RV Percent Paced: 89.09 %
Date Time Interrogation Session: 20240926195903
Implantable Lead Connection Status: 753985
Implantable Lead Connection Status: 753985
Implantable Lead Connection Status: 753985
Implantable Lead Implant Date: 20150417
Implantable Lead Implant Date: 20150417
Implantable Lead Implant Date: 20150417
Implantable Lead Location: 753858
Implantable Lead Location: 753859
Implantable Lead Location: 753860
Implantable Lead Model: 4298
Implantable Lead Model: 5076
Implantable Lead Model: 6935
Implantable Pulse Generator Implant Date: 20220603
Lead Channel Impedance Value: 323 Ohm
Lead Channel Impedance Value: 323 Ohm
Lead Channel Impedance Value: 323 Ohm
Lead Channel Impedance Value: 342 Ohm
Lead Channel Impedance Value: 342 Ohm
Lead Channel Impedance Value: 380 Ohm
Lead Channel Impedance Value: 380 Ohm
Lead Channel Impedance Value: 399 Ohm
Lead Channel Impedance Value: 418 Ohm
Lead Channel Impedance Value: 570 Ohm
Lead Channel Impedance Value: 608 Ohm
Lead Channel Impedance Value: 608 Ohm
Lead Channel Impedance Value: 646 Ohm
Lead Channel Impedance Value: 665 Ohm
Lead Channel Pacing Threshold Amplitude: 0.625 V
Lead Channel Pacing Threshold Amplitude: 0.875 V
Lead Channel Pacing Threshold Amplitude: 1.125 V
Lead Channel Pacing Threshold Pulse Width: 0.4 ms
Lead Channel Pacing Threshold Pulse Width: 0.4 ms
Lead Channel Pacing Threshold Pulse Width: 0.6 ms
Lead Channel Sensing Intrinsic Amplitude: 1.375 mV
Lead Channel Sensing Intrinsic Amplitude: 1.375 mV
Lead Channel Sensing Intrinsic Amplitude: 8.75 mV
Lead Channel Sensing Intrinsic Amplitude: 8.75 mV
Lead Channel Setting Pacing Amplitude: 1.5 V
Lead Channel Setting Pacing Amplitude: 1.75 V
Lead Channel Setting Pacing Amplitude: 2 V
Lead Channel Setting Pacing Pulse Width: 0.4 ms
Lead Channel Setting Pacing Pulse Width: 0.6 ms
Lead Channel Setting Sensing Sensitivity: 0.9 mV
Zone Setting Status: 755011

## 2023-04-16 ENCOUNTER — Other Ambulatory Visit: Payer: Self-pay | Admitting: Cardiology

## 2023-04-19 DIAGNOSIS — I700 Atherosclerosis of aorta: Secondary | ICD-10-CM | POA: Diagnosis not present

## 2023-04-19 DIAGNOSIS — I482 Chronic atrial fibrillation, unspecified: Secondary | ICD-10-CM | POA: Diagnosis not present

## 2023-04-19 DIAGNOSIS — I429 Cardiomyopathy, unspecified: Secondary | ICD-10-CM | POA: Diagnosis not present

## 2023-04-19 DIAGNOSIS — I130 Hypertensive heart and chronic kidney disease with heart failure and stage 1 through stage 4 chronic kidney disease, or unspecified chronic kidney disease: Secondary | ICD-10-CM | POA: Diagnosis not present

## 2023-04-19 DIAGNOSIS — I10 Essential (primary) hypertension: Secondary | ICD-10-CM | POA: Diagnosis not present

## 2023-04-19 DIAGNOSIS — N1831 Chronic kidney disease, stage 3a: Secondary | ICD-10-CM | POA: Diagnosis not present

## 2023-04-19 DIAGNOSIS — I5022 Chronic systolic (congestive) heart failure: Secondary | ICD-10-CM | POA: Diagnosis not present

## 2023-04-19 DIAGNOSIS — Z23 Encounter for immunization: Secondary | ICD-10-CM | POA: Diagnosis not present

## 2023-04-19 NOTE — Progress Notes (Signed)
Remote pacemaker transmission.   

## 2023-07-02 ENCOUNTER — Other Ambulatory Visit: Payer: Self-pay | Admitting: Cardiology

## 2023-07-02 NOTE — Telephone Encounter (Signed)
Prescription refill request for Xarelto received.  Indication: AF Last office visit: 03/15/23  Rosette Reveal MD Weight: 101.9kg Age: 82 Scr: 1.25 on 04/13/23  [facility] CrCl: 54.86  Based on above findings Xarelto 20mg  daily is the appropriate dose.  Refills approved.

## 2023-07-13 ENCOUNTER — Ambulatory Visit: Payer: Medicare HMO

## 2023-07-13 DIAGNOSIS — I428 Other cardiomyopathies: Secondary | ICD-10-CM | POA: Diagnosis not present

## 2023-07-14 LAB — CUP PACEART REMOTE DEVICE CHECK
Battery Remaining Longevity: 94 mo
Battery Voltage: 2.99 V
Brady Statistic AP VP Percent: 69.16 %
Brady Statistic AP VS Percent: 0.22 %
Brady Statistic AS VP Percent: 19.82 %
Brady Statistic AS VS Percent: 10.79 %
Brady Statistic RA Percent Paced: 75.46 %
Brady Statistic RV Percent Paced: 86.17 %
Date Time Interrogation Session: 20241227030408
Implantable Lead Connection Status: 753985
Implantable Lead Connection Status: 753985
Implantable Lead Connection Status: 753985
Implantable Lead Implant Date: 20150417
Implantable Lead Implant Date: 20150417
Implantable Lead Implant Date: 20150417
Implantable Lead Location: 753858
Implantable Lead Location: 753859
Implantable Lead Location: 753860
Implantable Lead Model: 4298
Implantable Lead Model: 5076
Implantable Lead Model: 6935
Implantable Pulse Generator Implant Date: 20220603
Lead Channel Impedance Value: 285 Ohm
Lead Channel Impedance Value: 304 Ohm
Lead Channel Impedance Value: 304 Ohm
Lead Channel Impedance Value: 304 Ohm
Lead Channel Impedance Value: 304 Ohm
Lead Channel Impedance Value: 323 Ohm
Lead Channel Impedance Value: 323 Ohm
Lead Channel Impedance Value: 342 Ohm
Lead Channel Impedance Value: 399 Ohm
Lead Channel Impedance Value: 513 Ohm
Lead Channel Impedance Value: 532 Ohm
Lead Channel Impedance Value: 532 Ohm
Lead Channel Impedance Value: 570 Ohm
Lead Channel Impedance Value: 570 Ohm
Lead Channel Pacing Threshold Amplitude: 0.625 V
Lead Channel Pacing Threshold Amplitude: 0.625 V
Lead Channel Pacing Threshold Amplitude: 1.25 V
Lead Channel Pacing Threshold Pulse Width: 0.4 ms
Lead Channel Pacing Threshold Pulse Width: 0.4 ms
Lead Channel Pacing Threshold Pulse Width: 0.6 ms
Lead Channel Sensing Intrinsic Amplitude: 1.5 mV
Lead Channel Sensing Intrinsic Amplitude: 1.5 mV
Lead Channel Sensing Intrinsic Amplitude: 8.625 mV
Lead Channel Sensing Intrinsic Amplitude: 8.625 mV
Lead Channel Setting Pacing Amplitude: 1.5 V
Lead Channel Setting Pacing Amplitude: 1.75 V
Lead Channel Setting Pacing Amplitude: 2 V
Lead Channel Setting Pacing Pulse Width: 0.4 ms
Lead Channel Setting Pacing Pulse Width: 0.6 ms
Lead Channel Setting Sensing Sensitivity: 0.9 mV
Zone Setting Status: 755011

## 2023-07-24 ENCOUNTER — Other Ambulatory Visit: Payer: Self-pay | Admitting: Internal Medicine

## 2023-08-14 ENCOUNTER — Other Ambulatory Visit: Payer: Self-pay | Admitting: Cardiology

## 2023-08-14 DIAGNOSIS — E559 Vitamin D deficiency, unspecified: Secondary | ICD-10-CM | POA: Diagnosis not present

## 2023-08-14 DIAGNOSIS — R7303 Prediabetes: Secondary | ICD-10-CM | POA: Diagnosis not present

## 2023-08-14 DIAGNOSIS — E782 Mixed hyperlipidemia: Secondary | ICD-10-CM | POA: Diagnosis not present

## 2023-08-14 DIAGNOSIS — E039 Hypothyroidism, unspecified: Secondary | ICD-10-CM | POA: Diagnosis not present

## 2023-08-16 NOTE — Addendum Note (Signed)
Addended by: Elease Etienne A on: 08/16/2023 11:16 AM   Modules accepted: Orders

## 2023-08-16 NOTE — Progress Notes (Signed)
Remote pacemaker transmission.

## 2023-08-20 ENCOUNTER — Ambulatory Visit (HOSPITAL_COMMUNITY)
Admission: RE | Admit: 2023-08-20 | Discharge: 2023-08-20 | Disposition: A | Discharge status: Home / Self Care | Payer: Medicare HMO | Source: Ambulatory Visit | Attending: Family Medicine | Admitting: Family Medicine

## 2023-08-20 ENCOUNTER — Other Ambulatory Visit (HOSPITAL_COMMUNITY): Payer: Self-pay | Admitting: Family Medicine

## 2023-08-20 DIAGNOSIS — I130 Hypertensive heart and chronic kidney disease with heart failure and stage 1 through stage 4 chronic kidney disease, or unspecified chronic kidney disease: Secondary | ICD-10-CM | POA: Diagnosis not present

## 2023-08-20 DIAGNOSIS — R0600 Dyspnea, unspecified: Secondary | ICD-10-CM

## 2023-08-20 DIAGNOSIS — I700 Atherosclerosis of aorta: Secondary | ICD-10-CM | POA: Diagnosis not present

## 2023-08-20 DIAGNOSIS — E782 Mixed hyperlipidemia: Secondary | ICD-10-CM | POA: Diagnosis not present

## 2023-08-20 DIAGNOSIS — N1831 Chronic kidney disease, stage 3a: Secondary | ICD-10-CM | POA: Diagnosis not present

## 2023-08-20 DIAGNOSIS — I5022 Chronic systolic (congestive) heart failure: Secondary | ICD-10-CM | POA: Diagnosis not present

## 2023-08-20 DIAGNOSIS — Z950 Presence of cardiac pacemaker: Secondary | ICD-10-CM | POA: Diagnosis not present

## 2023-08-20 DIAGNOSIS — I482 Chronic atrial fibrillation, unspecified: Secondary | ICD-10-CM | POA: Diagnosis not present

## 2023-08-20 DIAGNOSIS — I429 Cardiomyopathy, unspecified: Secondary | ICD-10-CM | POA: Diagnosis not present

## 2023-08-20 DIAGNOSIS — I10 Essential (primary) hypertension: Secondary | ICD-10-CM | POA: Diagnosis not present

## 2023-10-12 ENCOUNTER — Other Ambulatory Visit: Payer: Self-pay | Admitting: Cardiology

## 2023-10-12 ENCOUNTER — Ambulatory Visit (INDEPENDENT_AMBULATORY_CARE_PROVIDER_SITE_OTHER): Payer: Medicare HMO

## 2023-10-12 DIAGNOSIS — I5023 Acute on chronic systolic (congestive) heart failure: Secondary | ICD-10-CM

## 2023-10-12 DIAGNOSIS — I428 Other cardiomyopathies: Secondary | ICD-10-CM | POA: Diagnosis not present

## 2023-10-15 LAB — CUP PACEART REMOTE DEVICE CHECK
Battery Remaining Longevity: 93 mo
Battery Voltage: 2.99 V
Brady Statistic AP VP Percent: 69.43 %
Brady Statistic AP VS Percent: 0.19 %
Brady Statistic AS VP Percent: 21.24 %
Brady Statistic AS VS Percent: 9.14 %
Brady Statistic RA Percent Paced: 74.74 %
Brady Statistic RV Percent Paced: 88.76 %
Date Time Interrogation Session: 20250327215622
Implantable Lead Connection Status: 753985
Implantable Lead Connection Status: 753985
Implantable Lead Connection Status: 753985
Implantable Lead Implant Date: 20150417
Implantable Lead Implant Date: 20150417
Implantable Lead Implant Date: 20150417
Implantable Lead Location: 753858
Implantable Lead Location: 753859
Implantable Lead Location: 753860
Implantable Lead Model: 4298
Implantable Lead Model: 5076
Implantable Lead Model: 6935
Implantable Pulse Generator Implant Date: 20220603
Lead Channel Impedance Value: 323 Ohm
Lead Channel Impedance Value: 323 Ohm
Lead Channel Impedance Value: 323 Ohm
Lead Channel Impedance Value: 323 Ohm
Lead Channel Impedance Value: 342 Ohm
Lead Channel Impedance Value: 342 Ohm
Lead Channel Impedance Value: 342 Ohm
Lead Channel Impedance Value: 361 Ohm
Lead Channel Impedance Value: 437 Ohm
Lead Channel Impedance Value: 551 Ohm
Lead Channel Impedance Value: 570 Ohm
Lead Channel Impedance Value: 589 Ohm
Lead Channel Impedance Value: 589 Ohm
Lead Channel Impedance Value: 608 Ohm
Lead Channel Pacing Threshold Amplitude: 0.625 V
Lead Channel Pacing Threshold Amplitude: 0.625 V
Lead Channel Pacing Threshold Amplitude: 1.125 V
Lead Channel Pacing Threshold Pulse Width: 0.4 ms
Lead Channel Pacing Threshold Pulse Width: 0.4 ms
Lead Channel Pacing Threshold Pulse Width: 0.6 ms
Lead Channel Sensing Intrinsic Amplitude: 1.875 mV
Lead Channel Sensing Intrinsic Amplitude: 1.875 mV
Lead Channel Sensing Intrinsic Amplitude: 10.25 mV
Lead Channel Sensing Intrinsic Amplitude: 10.25 mV
Lead Channel Setting Pacing Amplitude: 1.5 V
Lead Channel Setting Pacing Amplitude: 1.75 V
Lead Channel Setting Pacing Amplitude: 2 V
Lead Channel Setting Pacing Pulse Width: 0.4 ms
Lead Channel Setting Pacing Pulse Width: 0.6 ms
Lead Channel Setting Sensing Sensitivity: 0.9 mV
Zone Setting Status: 755011

## 2023-10-30 ENCOUNTER — Encounter: Payer: Self-pay | Admitting: Cardiology

## 2023-10-30 ENCOUNTER — Ambulatory Visit: Payer: Medicare HMO | Attending: Cardiology | Admitting: Cardiology

## 2023-10-30 VITALS — BP 118/76 | HR 65 | Ht 67.0 in | Wt 239.8 lb

## 2023-10-30 DIAGNOSIS — Z79899 Other long term (current) drug therapy: Secondary | ICD-10-CM

## 2023-10-30 DIAGNOSIS — I428 Other cardiomyopathies: Secondary | ICD-10-CM | POA: Diagnosis not present

## 2023-10-30 MED ORDER — ENTRESTO 49-51 MG PO TABS: 1.0000 | ORAL_TABLET | Freq: Two times a day (BID) | ORAL | 11 refills | Status: DC

## 2023-10-30 NOTE — Patient Instructions (Signed)
 Medication Instructions:  STOP Benazapril TODAY  ON 11/01/23, START Entresto 49/51 mg twice a day   Labwork: [facility] in 2 weeks at [facility]  Testing/Procedures: ECHO in 4 months  Follow-Up: With Dr.McDowell after Echo in 4 months  Any Other Special Instructions Will Be Listed Below (If Applicable).  If you need a refill on your cardiac medications before your next appointment, please call your pharmacy.

## 2023-10-30 NOTE — Progress Notes (Signed)
 Cardiology Office Note  Date: 10/30/2023   ID: Jenniefer, Salak 1941/05/10, MRN [id number]  History of Present Illness: [name] is an 83 y.o. female last seen in June 2024.  She is here for a follow-up visit.  Reports largely NYHA class II dyspnea, sometimes class III with decrease in stamina over the last year.  She went through a period of fluid retention, as much is 15 pounds, but states that by dividing her Lasix to twice daily this has improved significantly.  She does not report any palpitations or syncope.  No orthopnea or PND.  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 Ladona Ridgel.  Device interrogation in March indicated normal function.  We went over her medications.  Discussed switching from Lotensin to [facility] in light of her last echocardiogram and interval symptoms.  Physical Exam: VS:  BP 118/76   Pulse 65   Ht 5\' 7"  (1.702 m)   Wt 239 lb 12.8 oz (108.8 kg)   SpO2 95%   BMI 37.56 kg/m , BMI Body mass index is 37.56 kg/m.  Wt Readings from Last 3 Encounters:  10/30/23 239 lb 12.8 oz (108.8 kg)  03/15/23 224 lb 9.6 oz (101.9 kg)  12/20/22 236 lb 3.2 oz (107.1 kg)    General: Patient appears comfortable at rest. HEENT: Conjunctiva and lids normal. Neck: Supple, no elevated JVP or carotid bruits. Lungs: Clear to auscultation, nonlabored breathing at rest. Cardiac: Regular rate and rhythm, no S3 or significant systolic murmur. Extremities: Bilateral lymphedema.  ECG:  An ECG dated 03/15/2023 was personally reviewed today and demonstrated:  AV pacing.  Labwork:  January 2025: TSH 4.66, hemoglobin 11.4, platelets 162, BUN 22, creatinine 1.13, potassium 4.7, AST 14, ALT 10, cholesterol 195, triglycerides 81, HDL 44, LDL 136  Other Studies Reviewed Today:  Echocardiogram 02/02/2023:  1. Left ventricular ejection fraction, by estimation, is 45 to 50%. The  left ventricle has mildly decreased function. The left ventricle  demonstrates  global hypokinesis. Left ventricular diastolic parameters are  consistent with Grade I diastolic  dysfunction (impaired relaxation).   2. Right ventricular systolic function was not well visualized. The right  ventricular size is mildly enlarged. Tricuspid regurgitation signal is  inadequate for assessing PA pressure.   3. Left atrial size was mildly dilated.   4. The mitral valve is grossly normal. No evidence of mitral valve  regurgitation. No evidence of mitral stenosis.   5. The aortic valve was not well visualized. Aortic valve regurgitation  is mild. No aortic stenosis is present.   Assessment and Plan:  1.  HFmrEF with history of nonischemic cardiomyopathy.  Follow-up echocardiogram in July 2024 revealed LVEF 45 to 50% with mild diastolic dysfunction.  She has had some interval fluctuation in fluid retention, now on Lasix 40 mg twice daily.  Plan to switch from Lotensin to Entresto 49/51 mg twice daily after appropriate washout and otherwise continue Coreg 25 mg 1-1/2 tablets twice a day.  Check BMET in 2 weeks.  Plan to follow-up echocardiogram with her next visit.   2.  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 Ladona Ridgel.   3.  Paroxysmal atrial fibrillation with CHA2DS2-VASc score of 7.  No interval palpitations.  She is on Xarelto 20 mg daily for stroke prophylaxis, creatinine clearance 65.   4.  Chronic lymphedema.  On Lasix 40 mg twice daily and has as needed mechanical compression available at home.   5.  Primary hypertension.  Also on hydralazine 25 mg 3 times a day.  Disposition:  Follow up  4 months.  Signed, Gerard Knight, M.D., F.A.C.C. [facility]

## 2023-11-05 ENCOUNTER — Telehealth: Payer: Self-pay | Admitting: Cardiology

## 2023-11-05 MED ORDER — BENAZEPRIL HCL 40 MG PO TABS
40.0000 mg | ORAL_TABLET | Freq: Every day | ORAL | Status: DC
Start: 2023-11-05 — End: 2024-01-30

## 2023-11-05 NOTE — Telephone Encounter (Signed)
 Pt c/o medication issue:  1. Name of Medication:   sacubitril-valsartan (ENTRESTO ) 49-51 MG   2. How are you currently taking this medication (dosage and times per day)?   As prescribed  3. Are you having a reaction (difficulty breathing--STAT)?   Itching, difficulty breathing, cough  4. What is your medication issue?   Patient stated the medication is causing her discomfort and wants to change this medication.

## 2023-11-05 NOTE — Telephone Encounter (Signed)
 Patient states since switching from benazepril  to entresto  she is experiencing "cough,jitteriness,and itchiness"  She is requesting to go back on benazapril   I will message Dr.McDowell.

## 2023-11-05 NOTE — Telephone Encounter (Signed)
 Patient wants to go back on Benazepril  40 mg daily and does not wish to try Farxiga at this time. I have cancelled her echo in August but she still wants to keep her follow with Dr.McDowell.

## 2023-11-14 ENCOUNTER — Other Ambulatory Visit (HOSPITAL_COMMUNITY)
Admission: RE | Admit: 2023-11-14 | Discharge: 2023-11-14 | Disposition: A | Discharge status: Home / Self Care | Source: Ambulatory Visit | Attending: Cardiology | Admitting: Cardiology

## 2023-11-14 DIAGNOSIS — Z79899 Other long term (current) drug therapy: Secondary | ICD-10-CM | POA: Insufficient documentation

## 2023-11-14 LAB — BASIC METABOLIC PANEL WITH GFR
Anion gap: 8 (ref 5–15)
BUN: 20 mg/dL (ref 8–23)
CO2: 28 mmol/L (ref 22–32)
Calcium: 9.6 mg/dL (ref 8.9–10.3)
Chloride: 97 mmol/L — ABNORMAL LOW (ref 98–111)
Creatinine, Ser: 1.25 mg/dL — ABNORMAL HIGH (ref 0.44–1.00)
GFR, Estimated: 43 mL/min — ABNORMAL LOW (ref >60)
Glucose, Bld: 93 mg/dL (ref 70–99)
Potassium: 4.7 mmol/L (ref 3.5–5.1)
Sodium: 133 mmol/L — ABNORMAL LOW (ref 135–145)

## 2023-11-15 ENCOUNTER — Telehealth: Payer: Self-pay | Admitting: Cardiology

## 2023-11-15 NOTE — Telephone Encounter (Signed)
 Returned call to pt. No answer. No voice mail.

## 2023-11-15 NOTE — Telephone Encounter (Signed)
 Pt returning call for results, requesting cb

## 2023-11-16 NOTE — Telephone Encounter (Signed)
 The patient has been notified of the result and verbalized understanding.  All questions (if any) were answered. Carole Churches, LPN 07/22/1094 04:54 AM

## 2023-11-20 NOTE — Progress Notes (Signed)
 Remote pacemaker transmission.

## 2023-11-20 NOTE — Addendum Note (Signed)
 Addended by: Lott Rouleau A on: 11/20/2023 05:08 PM   Modules accepted: Orders

## 2023-12-25 ENCOUNTER — Other Ambulatory Visit: Payer: Self-pay | Admitting: Cardiology

## 2023-12-25 NOTE — Telephone Encounter (Signed)
 Prescription refill request for Xarelto  received.  Indication: PAF Last office visit: 10/30/23  Anselm Basset MD Weight: 191.4NW Age: 83 Scr: 1.25 on 11/14/23  Epic CrCl: 58.57  Based on above findings Xarelto  20mg  daily is the appropriate dose.  Refill approved.

## 2024-01-04 DIAGNOSIS — R7303 Prediabetes: Secondary | ICD-10-CM | POA: Diagnosis not present

## 2024-01-04 DIAGNOSIS — E559 Vitamin D deficiency, unspecified: Secondary | ICD-10-CM | POA: Diagnosis not present

## 2024-01-04 DIAGNOSIS — E782 Mixed hyperlipidemia: Secondary | ICD-10-CM | POA: Diagnosis not present

## 2024-01-04 DIAGNOSIS — E039 Hypothyroidism, unspecified: Secondary | ICD-10-CM | POA: Diagnosis not present

## 2024-01-11 ENCOUNTER — Ambulatory Visit: Payer: Medicare HMO

## 2024-01-11 ENCOUNTER — Telehealth: Payer: Self-pay

## 2024-01-11 DIAGNOSIS — I10 Essential (primary) hypertension: Secondary | ICD-10-CM | POA: Diagnosis not present

## 2024-01-11 DIAGNOSIS — G894 Chronic pain syndrome: Secondary | ICD-10-CM | POA: Diagnosis not present

## 2024-01-11 DIAGNOSIS — I130 Hypertensive heart and chronic kidney disease with heart failure and stage 1 through stage 4 chronic kidney disease, or unspecified chronic kidney disease: Secondary | ICD-10-CM | POA: Diagnosis not present

## 2024-01-11 DIAGNOSIS — N1831 Chronic kidney disease, stage 3a: Secondary | ICD-10-CM | POA: Diagnosis not present

## 2024-01-11 DIAGNOSIS — I482 Chronic atrial fibrillation, unspecified: Secondary | ICD-10-CM | POA: Diagnosis not present

## 2024-01-11 DIAGNOSIS — I700 Atherosclerosis of aorta: Secondary | ICD-10-CM | POA: Diagnosis not present

## 2024-01-11 DIAGNOSIS — I5022 Chronic systolic (congestive) heart failure: Secondary | ICD-10-CM | POA: Diagnosis not present

## 2024-01-11 DIAGNOSIS — I428 Other cardiomyopathies: Secondary | ICD-10-CM

## 2024-01-11 DIAGNOSIS — I429 Cardiomyopathy, unspecified: Secondary | ICD-10-CM | POA: Diagnosis not present

## 2024-01-11 LAB — CUP PACEART REMOTE DEVICE CHECK
Battery Remaining Longevity: 87 mo
Battery Voltage: 2.98 V
Brady Statistic RA Percent Paced: 0.51 %
Brady Statistic RV Percent Paced: 81.39 %
Date Time Interrogation Session: 20250627055841
Implantable Lead Connection Status: 753985
Implantable Lead Connection Status: 753985
Implantable Lead Connection Status: 753985
Implantable Lead Implant Date: 20150417
Implantable Lead Implant Date: 20150417
Implantable Lead Implant Date: 20150417
Implantable Lead Location: 753858
Implantable Lead Location: 753859
Implantable Lead Location: 753860
Implantable Lead Model: 4298
Implantable Lead Model: 5076
Implantable Lead Model: 6935
Implantable Pulse Generator Implant Date: 20220603
Lead Channel Impedance Value: 285 Ohm
Lead Channel Impedance Value: 323 Ohm
Lead Channel Impedance Value: 323 Ohm
Lead Channel Impedance Value: 323 Ohm
Lead Channel Impedance Value: 323 Ohm
Lead Channel Impedance Value: 323 Ohm
Lead Channel Impedance Value: 342 Ohm
Lead Channel Impedance Value: 361 Ohm
Lead Channel Impedance Value: 494 Ohm
Lead Channel Impedance Value: 513 Ohm
Lead Channel Impedance Value: 551 Ohm
Lead Channel Impedance Value: 570 Ohm
Lead Channel Impedance Value: 589 Ohm
Lead Channel Impedance Value: 589 Ohm
Lead Channel Pacing Threshold Amplitude: 0.625 V
Lead Channel Pacing Threshold Amplitude: 0.875 V
Lead Channel Pacing Threshold Amplitude: 1.5 V
Lead Channel Pacing Threshold Pulse Width: 0.4 ms
Lead Channel Pacing Threshold Pulse Width: 0.4 ms
Lead Channel Pacing Threshold Pulse Width: 0.6 ms
Lead Channel Sensing Intrinsic Amplitude: 11 mV
Lead Channel Sensing Intrinsic Amplitude: 11 mV
Lead Channel Sensing Intrinsic Amplitude: 2 mV
Lead Channel Sensing Intrinsic Amplitude: 2 mV
Lead Channel Setting Pacing Amplitude: 1.75 V
Lead Channel Setting Pacing Amplitude: 2 V
Lead Channel Setting Pacing Amplitude: 2 V
Lead Channel Setting Pacing Pulse Width: 0.4 ms
Lead Channel Setting Pacing Pulse Width: 0.6 ms
Lead Channel Setting Sensing Sensitivity: 0.9 mV
Zone Setting Status: 755011

## 2024-01-11 NOTE — Telephone Encounter (Signed)
 Alert received from [employer] for  presenting rhythm: AF-BVP 81% BVP  Recent decrease per trends.  Ongoing AF since 01/06/24.   Patient called advised of findings. Patient reports increased shortness of breath over the past week. No other symptoms noted. Compliance with Xarelto  with no missed doses in the past month. Recommended AF clinic apt to discuss AF/symptoms further. Pt is agreeable. Advised pt someone from the AF clinic will reach out for apt info. Pt was appreciative of call.   **I anticipate if patient resumes normal rhythm, Bi-V pacing will then increase w/ regular v-v intervals.

## 2024-01-14 ENCOUNTER — Ambulatory Visit: Payer: Self-pay | Admitting: Internal Medicine

## 2024-01-15 ENCOUNTER — Other Ambulatory Visit: Payer: Self-pay | Admitting: Cardiology

## 2024-01-15 DIAGNOSIS — I5023 Acute on chronic systolic (congestive) heart failure: Secondary | ICD-10-CM

## 2024-01-16 ENCOUNTER — Ambulatory Visit (HOSPITAL_COMMUNITY): Admitting: Internal Medicine

## 2024-01-25 ENCOUNTER — Ambulatory Visit (HOSPITAL_COMMUNITY): Admitting: Internal Medicine

## 2024-01-29 ENCOUNTER — Other Ambulatory Visit: Payer: Self-pay | Admitting: Cardiology

## 2024-02-05 ENCOUNTER — Other Ambulatory Visit: Payer: Self-pay | Admitting: Cardiology

## 2024-02-29 ENCOUNTER — Other Ambulatory Visit (HOSPITAL_COMMUNITY)

## 2024-03-05 ENCOUNTER — Ambulatory Visit: Admitting: Cardiology

## 2024-03-06 ENCOUNTER — Ambulatory Visit: Admitting: Cardiology

## 2024-03-21 NOTE — Progress Notes (Signed)
 Remote pacemaker transmission.

## 2024-03-26 ENCOUNTER — Ambulatory Visit: Attending: Internal Medicine | Admitting: Internal Medicine

## 2024-03-26 VITALS — BP 136/80 | HR 88 | Ht 67.0 in | Wt 250.0 lb

## 2024-03-26 DIAGNOSIS — I4891 Unspecified atrial fibrillation: Secondary | ICD-10-CM

## 2024-03-26 DIAGNOSIS — I5022 Chronic systolic (congestive) heart failure: Secondary | ICD-10-CM

## 2024-03-26 LAB — CUP PACEART INCLINIC DEVICE CHECK
Date Time Interrogation Session: 20250910145156
Implantable Lead Connection Status: 753985
Implantable Lead Connection Status: 753985
Implantable Lead Connection Status: 753985
Implantable Lead Implant Date: 20150417
Implantable Lead Implant Date: 20150417
Implantable Lead Implant Date: 20150417
Implantable Lead Location: 753858
Implantable Lead Location: 753859
Implantable Lead Location: 753860
Implantable Lead Model: 4298
Implantable Lead Model: 5076
Implantable Lead Model: 6935
Implantable Pulse Generator Implant Date: 20220603
Lead Channel Impedance Value: 361 Ohm
Lead Channel Impedance Value: 532 Ohm
Lead Channel Impedance Value: 646 Ohm
Lead Channel Pacing Threshold Amplitude: 0.75 V
Lead Channel Pacing Threshold Amplitude: 1 V
Lead Channel Pacing Threshold Pulse Width: 0.4 ms
Lead Channel Pacing Threshold Pulse Width: 0.6 ms
Lead Channel Sensing Intrinsic Amplitude: 2.8 mV
Lead Channel Sensing Intrinsic Amplitude: 9.6 mV

## 2024-03-26 MED ORDER — DIGOXIN 125 MCG PO TABS: ORAL_TABLET | ORAL | 3 refills | Status: AC

## 2024-03-26 NOTE — Progress Notes (Signed)
 HPI [name] returns today for followup. She is a pleasant 83 yo woman with a h/o chronic systolic heart failure, obesity, atrial fib, and LBBB who underwent biv ICD insertion several years ago. She reached ERI and underwent gen change with downgrade to a BIV PPM. She denies chest pain. She has class 2 CHF symptoms. She has lymphedema. She denies non-compliance. She has disc problems in her back and has chronic back pain. Her afib has become persistent. She does not have palpitations.  Allergies  Allergen Reactions   Ciprofloxacin Cough   Dexlansoprazole      Lower abdominal pain.   Statins Other (See Comments)    Arthralgias during treatment with at least 2 members of his class    Amoxicillin Rash     Current Outpatient Medications  Medication Sig Dispense Refill   benazepril  (LOTENSIN ) 40 MG tablet Take 1 tablet by mouth once daily 90 tablet 3   carvedilol  (COREG ) 25 MG tablet TAKE 1 & 1/2 (ONE & ONE-HALF) TABLETS BY MOUTH TWICE DAILY WITH A MEAL 270 tablet 3   cholecalciferol (VITAMIN D ) 1000 UNITS tablet Take 1,000 Units by mouth daily.     fexofenadine (ALLEGRA) 180 MG tablet Take 180 mg by mouth every morning.     fluticasone  (FLONASE ) 50 MCG/ACT nasal spray Place 2 sprays into both nostrils daily as needed for allergies. 16 g 0   furosemide  (LASIX ) 40 MG tablet TAKE 1 TABLET BY MOUTH TWICE DAILY AS DIRECTED 60 tablet 8   hydrALAZINE  (APRESOLINE ) 25 MG tablet TAKE 1 TABLET BY MOUTH THREE TIMES DAILY 270 tablet 2   lansoprazole  (PREVACID ) 30 MG capsule TAKE 1 CAPSULE BY MOUTH BEFORE BREAKFAST 90 capsule 3   ondansetron  (ZOFRAN ) 4 MG tablet Take 1 tablet (4 mg total) by mouth every 8 (eight) hours as needed for nausea or vomiting. 20 tablet 0   rivaroxaban  (XARELTO ) 20 MG TABS tablet TAKE 1 TABLET BY MOUTH ONCE DAILY WITH SUPPER 90 tablet 1   SYNTHROID  125 MCG tablet Take 1 tablet by mouth once daily (Patient taking differently: Take 125 mcg by mouth daily before breakfast.)  30 tablet 11   traMADol  (ULTRAM ) 50 MG tablet Take 1 tablet (50 mg total) by mouth every 6 (six) hours as needed for moderate pain. (Patient taking differently: Take 50 mg by mouth every 12 (twelve) hours as needed for moderate pain (pain score 4-6) or severe pain (pain score 7-10).) 30 tablet 0   EYSUVIS 0.25 % SUSP INSTILL 1 DROP INTO EACH EYE THREE TIMES DAILY FOR 14 DAYS (Patient not taking: Reported on 03/26/2024)     No current facility-administered medications for this visit.     Past Medical History:  Diagnosis Date   Allergy    Anemia    Atrial fibrillation (HCC)    Cardiomyopathy, nonischemic (HCC)    a. EF previously 35% in 2008 with cath showing no significant CAD b. EF improved to 55-60% in 02/2020   Cataract    Cerebrovascular accident [facility])    No clinical event; asymptomatic CT finding   CHF (congestive heart failure) (HCC)    Chronic kidney disease    COPD (chronic obstructive pulmonary disease) (HCC)    DDD (degenerative disc disease), cervical    Cervical   Degenerative joint disease    Right hip and knee   Essential hypertension    GERD (gastroesophageal reflux disease)    Hip dislocation, right (HCC)    Hyperlipemia    Hypothyroid  LBBB (left bundle branch block)    Pacemaker    S/P colonoscopy 2009   Dr. Jakie: reportedly normal per pt   S/P endoscopy 1980s    ROS:   All systems reviewed and negative except as noted in the HPI.   Past Surgical History:  Procedure Laterality Date   ABDOMINAL HYSTERECTOMY     APPENDECTOMY     BI-VENTRICULAR IMPLANTABLE CARDIOVERTER DEFIBRILLATOR  (CRT-D)  10-31-2013   MDT Fraser Hawthorne CRTD implanted by Dr Waddell   BIOPSY  12/18/2018   Procedure: BIOPSY;  Surgeon: Shaaron Lamar HERO, MD;  Location: [facility] END[facility];  Service: Endoscopy;;  Gastric    BIV PACEMAKER INSERTION CRT-P N/A 12/17/2020   Procedure: DOWNGRAD TO BIV PACEMAKER INSERTION CRT-P;  Surgeon: Waddell Danelle ORN, MD;  Location: [facility] INVASIVE CV LAB;  Service:  Cardiovascular;  Laterality: N/A;   BREAST BIOPSY     bilaterally; benign   can not have MRI due to jaw surgery     CATARACT EXTRACTION W/PHACO Left 03/28/2021   Procedure: CATARACT EXTRACTION PHACO AND INTRAOCULAR LENS PLACEMENT (IOC);  Surgeon: Harrie Agent, MD;  Location: [facility] ORS;  Service: Ophthalmology;  Laterality: Left;  CDE  17.17   CATARACT EXTRACTION W/PHACO Right 04/11/2021   Procedure: CATARACT EXTRACTION PHACO AND INTRAOCULAR LENS PLACEMENT RIGHT EYE;  Surgeon: Harrie Agent, MD;  Location: [facility] ORS;  Service: Ophthalmology;  Laterality: Right;  right CDE=17.03   CHOLECYSTECTOMY     COLONOSCOPY  2009   Dr. Jakie: Normal   ESOPHAGOGASTRODUODENOSCOPY  07/13/2011   small hh, patchy gastric erythema with scarring deformity of antrum, chronic gastritis, bx benign with no H.Pylori. Procedure: ESOPHAGOGASTRODUODENOSCOPY (EGD);  Surgeon: Lamar HERO Shaaron, MD;  Location: [facility];  Service: Endoscopy;  Laterality: N/A;  10:45   ESOPHAGOGASTRODUODENOSCOPY N/A 12/18/2018   Dr. Shaaron: normal esophagus s/p dilation, small hh, erythema in stomach with benign biopsy.    IMPLANTABLE CARDIOVERTER DEFIBRILLATOR IMPLANT N/A 10/31/2013   Procedure: IMPLANTABLE CARDIOVERTER DEFIBRILLATOR IMPLANT;  Surgeon: Danelle ORN Waddell, MD;  Location: [facility] CATH LAB;  Service: Cardiovascular;  Laterality: N/A;   IR RADIOLOGIST EVAL & MGMT  03/16/2022   MALONEY DILATION N/A 12/18/2018   Procedure: AGAPITO DILATION;  Surgeon: Shaaron Lamar HERO, MD;  Location: [facility];  Service: Endoscopy;  Laterality: N/A;   MANDIBLE SURGERY     secondary to malocclusion   THYROIDECTOMY     neoplasm   Ureteral Surgery  1970     Family History  Problem Relation Age of Onset   Brain cancer Mother    Heart disease Mother    Hypertension Mother    Stroke Mother    Atrial fibrillation Other    Stroke Brother    Colon cancer Neg Hx      Social History   Socioeconomic History   Marital status: Widowed    Spouse name:  Not on file   Number of children: 2   Years of education: Not on file   Highest education level: Not on file  Occupational History   Occupation: retired  Tobacco Use   Smoking status: Never   Smokeless tobacco: Never  Vaping Use   Vaping status: Never Used  Substance and Sexual Activity   Alcohol use: No    Alcohol/week: 0.0 standard drinks of alcohol   Drug use: No   Sexual activity: Not Currently  Other Topics Concern   Not on file  Social History Narrative   Widowed  Social Drivers of Corporate investment banker Strain: Not on file  Food Insecurity: Not on file  Transportation Needs: Not on file  Physical Activity: Not on file  Stress: Not on file  Social Connections: Not on file  Intimate Partner Violence: Not on file     BP 136/80 (BP Location: Left Arm, Patient Position: Sitting, Cuff Size: Large)   Pulse 88   Ht 5' 7 (1.702 m)   Wt 250 lb (113.4 kg)   SpO2 93%   BMI 39.16 kg/m   Physical Exam:  stable appearing elderly woman, NAD HEENT: Unremarkable Neck:  No JVD, no thyromegally Lymphatics:  No adenopathy Back:  No CVA tenderness Lungs:  Clear with no wheezes HEART:  Regular rate rhythm, no murmurs, no rubs, no clicks Abd:  soft, positive bowel sounds, no organomegally, no rebound, no guarding Ext:  2 plus pulses, no edema, no cyanosis, no clubbing Skin:  No rashes no nodules Neuro:  CN II through XII intact, motor grossly intact  DEVICE  Normal device function.  See PaceArt for details.   Assess/Plan:  Chronic systolic heart failure - her symptoms remain class 2. We will follow. Her EF has near normalized with biv pacing Obesity - she is encouraged to maintain a low fat diet. HTN - her bp is a little elevated. She will continue her current meds. Ideally we could switch to Entresto  but shes concerned about the price.   4. Atrial fib - she is in persistent atrial fib and her rates are up a little. Continue xarelto . We will start low dose  digoxin  and check a dig level in three weeks.    Danelle Lorraine Terriquez,MD

## 2024-03-26 NOTE — Patient Instructions (Signed)
 Medication Instructions:  Your physician has recommended you make the following change in your medication:   Start Digoxin  0.125 mg Take one daily on Monday- Friday. None on Saturday or Sunday.   *If you need a refill on your cardiac medications before your next appointment, please call your pharmacy*  Lab Work: Your physician recommends that you return for lab work in: 3 weeks ( Digoxin  level)   If you have labs (blood work) drawn today and your tests are completely normal, you will receive your results only by: MyChart Message (if you have MyChart) OR A paper copy in the mail If you have any lab test that is abnormal or we need to change your treatment, we will call you to review the results.  Testing/Procedures: NONE   Follow-Up: At [facility], you and your health needs are our priority.  As part of our continuing mission to provide you with exceptional heart care, our providers are all part of one team.  This team includes your primary Cardiologist (physician) and Advanced Practice Providers or APPs (Physician Assistants and Nurse Practitioners) who all work together to provide you with the care you need, when you need it.  Your next appointment:   1 year(s)  Provider:   Dr. Almetta     We recommend signing up for the patient portal called MyChart.  Sign up information is provided on this After Visit Summary.  MyChart is used to connect with patients for Virtual Visits (Telemedicine).  Patients are able to view lab/test results, encounter notes, upcoming appointments, etc.  Non-urgent messages can be sent to your provider as well.   To learn more about what you can do with MyChart, go to [email or web address].   Other Instructions Thank you for choosing [facility]!

## 2024-04-11 ENCOUNTER — Ambulatory Visit: Payer: Medicare HMO

## 2024-04-11 DIAGNOSIS — I4891 Unspecified atrial fibrillation: Secondary | ICD-10-CM | POA: Diagnosis not present

## 2024-04-12 LAB — CUP PACEART REMOTE DEVICE CHECK
Battery Remaining Longevity: 84 mo
Battery Voltage: 2.98 V
Brady Statistic RA Percent Paced: 0.61 %
Brady Statistic RV Percent Paced: 86.02 %
Date Time Interrogation Session: 20250926000439
Implantable Lead Connection Status: 753985
Implantable Lead Connection Status: 753985
Implantable Lead Connection Status: 753985
Implantable Lead Implant Date: 20150417
Implantable Lead Implant Date: 20150417
Implantable Lead Implant Date: 20150417
Implantable Lead Location: 753858
Implantable Lead Location: 753859
Implantable Lead Location: 753860
Implantable Lead Model: 4298
Implantable Lead Model: 5076
Implantable Lead Model: 6935
Implantable Pulse Generator Implant Date: 20220603
Lead Channel Impedance Value: 323 Ohm
Lead Channel Impedance Value: 323 Ohm
Lead Channel Impedance Value: 342 Ohm
Lead Channel Impedance Value: 342 Ohm
Lead Channel Impedance Value: 361 Ohm
Lead Channel Impedance Value: 361 Ohm
Lead Channel Impedance Value: 380 Ohm
Lead Channel Impedance Value: 380 Ohm
Lead Channel Impedance Value: 551 Ohm
Lead Channel Impedance Value: 570 Ohm
Lead Channel Impedance Value: 589 Ohm
Lead Channel Impedance Value: 608 Ohm
Lead Channel Impedance Value: 608 Ohm
Lead Channel Impedance Value: 627 Ohm
Lead Channel Pacing Threshold Amplitude: 0.75 V
Lead Channel Pacing Threshold Amplitude: 0.875 V
Lead Channel Pacing Threshold Amplitude: 1.5 V
Lead Channel Pacing Threshold Pulse Width: 0.4 ms
Lead Channel Pacing Threshold Pulse Width: 0.4 ms
Lead Channel Pacing Threshold Pulse Width: 0.6 ms
Lead Channel Sensing Intrinsic Amplitude: 0.75 mV
Lead Channel Sensing Intrinsic Amplitude: 0.75 mV
Lead Channel Sensing Intrinsic Amplitude: 7.25 mV
Lead Channel Sensing Intrinsic Amplitude: 7.25 mV
Lead Channel Setting Pacing Amplitude: 1.75 V
Lead Channel Setting Pacing Amplitude: 2 V
Lead Channel Setting Pacing Amplitude: 2 V
Lead Channel Setting Pacing Pulse Width: 0.4 ms
Lead Channel Setting Pacing Pulse Width: 0.6 ms
Lead Channel Setting Sensing Sensitivity: 0.9 mV
Zone Setting Status: 755011

## 2024-04-13 ENCOUNTER — Ambulatory Visit: Payer: Self-pay | Admitting: Internal Medicine

## 2024-04-16 NOTE — Progress Notes (Signed)
 Remote ICD Transmission

## 2024-04-22 ENCOUNTER — Other Ambulatory Visit: Payer: Self-pay | Admitting: Cardiology

## 2024-05-05 DIAGNOSIS — I4891 Unspecified atrial fibrillation: Secondary | ICD-10-CM | POA: Diagnosis not present

## 2024-05-06 LAB — DIGOXIN LEVEL: Digoxin, Serum: 0.9 ng/mL (ref 0.5–0.9)

## 2024-05-07 ENCOUNTER — Ambulatory Visit: Payer: Self-pay | Admitting: Internal Medicine

## 2024-05-14 DIAGNOSIS — E559 Vitamin D deficiency, unspecified: Secondary | ICD-10-CM | POA: Diagnosis not present

## 2024-05-14 DIAGNOSIS — E039 Hypothyroidism, unspecified: Secondary | ICD-10-CM | POA: Diagnosis not present

## 2024-05-14 DIAGNOSIS — R7303 Prediabetes: Secondary | ICD-10-CM | POA: Diagnosis not present

## 2024-05-14 DIAGNOSIS — E782 Mixed hyperlipidemia: Secondary | ICD-10-CM | POA: Diagnosis not present

## 2024-05-22 ENCOUNTER — Other Ambulatory Visit (HOSPITAL_COMMUNITY): Payer: Self-pay | Admitting: Family Medicine

## 2024-05-22 DIAGNOSIS — Z5320 Procedure and treatment not carried out because of patient's decision for unspecified reasons: Secondary | ICD-10-CM

## 2024-05-29 ENCOUNTER — Encounter: Payer: Self-pay | Admitting: Cardiology

## 2024-05-29 ENCOUNTER — Other Ambulatory Visit (HOSPITAL_COMMUNITY): Payer: Self-pay

## 2024-05-29 ENCOUNTER — Telehealth: Payer: Self-pay | Admitting: Pharmacy Technician

## 2024-05-29 ENCOUNTER — Ambulatory Visit: Attending: Cardiology | Admitting: Cardiology

## 2024-05-29 VITALS — BP 150/72 | HR 73 | Ht 67.0 in | Wt 261.8 lb

## 2024-05-29 DIAGNOSIS — I4819 Other persistent atrial fibrillation: Secondary | ICD-10-CM

## 2024-05-29 DIAGNOSIS — I890 Lymphedema, not elsewhere classified: Secondary | ICD-10-CM | POA: Diagnosis not present

## 2024-05-29 DIAGNOSIS — I5022 Chronic systolic (congestive) heart failure: Secondary | ICD-10-CM

## 2024-05-29 DIAGNOSIS — I5020 Unspecified systolic (congestive) heart failure: Secondary | ICD-10-CM

## 2024-05-29 DIAGNOSIS — I4891 Unspecified atrial fibrillation: Secondary | ICD-10-CM | POA: Diagnosis not present

## 2024-05-29 DIAGNOSIS — Z79899 Other long term (current) drug therapy: Secondary | ICD-10-CM

## 2024-05-29 MED ORDER — FUROSEMIDE 40 MG PO TABS
ORAL_TABLET | ORAL | 3 refills | Status: AC
Start: 2024-05-29 — End: ?

## 2024-05-29 MED ORDER — KERENDIA 10 MG PO TABS: 10.0000 mg | ORAL_TABLET | Freq: Every day | ORAL | 3 refills | Status: AC

## 2024-05-29 NOTE — Telephone Encounter (Signed)
 Pharmacy Patient Advocate Encounter   Received notification from Fax that prior authorization for kerendia 10 MG is required/requested.   Insurance verification completed.   The patient is insured through [employer].   Per test claim: PA required; PA submitted to above mentioned insurance via Latent Key/confirmation #/EOC [id number] Status is pending

## 2024-05-29 NOTE — Progress Notes (Signed)
 Cardiology Office Note  Date: 05/29/2024   ID: Karstyn, Birkey 1940-07-23, MRN [id number]  History of Present Illness: [name] is an 83 y.o. female last seen in April.  She is here for a follow-up visit.  Since last encounter she has gained fluid weight, 15 to 20 pounds, lymphedema has also been worse.  We did attempt to switch her from benazepril  to Entresto , she reports intolerance including cough and jitteriness however.  She was also placed on digoxin  by Dr. Waddell to provide better heart rate control of atrial fibrillation which has been persistent by device interrogation since June.  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 Waddell.  Device check in September revealed normal function with underlying persistent atrial fibrillation and biventricular pacing of 86%.  We went over her medications.  We discussed increasing Lasix  with addition of Kerendia and also a follow-up echocardiogram to ensure no further decline in LVEF since last assessment.  I reviewed her ECG today which shows ventricular pacing with underlying atrial fibrillation.  Physical Exam: VS:  BP (!) 150/72   Pulse 73   Ht 5' 7 (1.702 m)   Wt 261 lb 12.8 oz (118.8 kg)   SpO2 96%   BMI 41.00 kg/m , BMI Body mass index is 41 kg/m.  Wt Readings from Last 3 Encounters:  05/29/24 261 lb 12.8 oz (118.8 kg)  03/26/24 250 lb (113.4 kg)  10/30/23 239 lb 12.8 oz (108.8 kg)    General: Patient appears comfortable at rest. HEENT: Conjunctiva and lids normal. Neck: Supple, no elevated JVP or carotid bruits. Lungs: Clear to auscultation, nonlabored breathing at rest. Cardiac: Regular rate and rhythm, no gallop or significant murmur. Extremities: Chronic appearing, firm lymphedema bilaterally.  ECG:  An ECG dated 03/15/2023 was personally reviewed today and demonstrated:  Dual-chamber pacing.  Labwork: 11/14/2023: BUN 20; Creatinine, Ser 1.25; Potassium 4.7; Sodium 133  October 2025:  TSH 3.37, hemoglobin 11.7, platelets 198, BUN 21, creatinine 1.5, GFR 34, potassium 4.3, AST 15, ALT 12, cholesterol 214, triglycerides 159, HDL 36, LDL 149, hemoglobin A1c 6.1%, digoxin  level 0.9  Other Studies Reviewed Today:  No interval cardiac testing for review today.  Assessment and Plan:  1.  HFmrEF with history of nonischemic cardiomyopathy.  Follow-up echocardiogram in July 2024 revealed LVEF 45 to 50% with mild diastolic dysfunction.  She has had progressive fluid retention since last visit, did not tolerate switch from benazepril  to Entresto  as discussed.  Plan to increase Lasix  to 80 mg in the morning and 40 mg in the evening, add Kerendia 10 mg daily.  Check BMET in 2 weeks.  Go ahead with follow-up echocardiogram to ensure no further decline in LVEF since last assessment.  Otherwise continues on Coreg  25 mg 1-1/2 tablets twice daily, Lanoxin  0.125 mg on Monday through Friday, and benazepril  40 mg daily.  Office follow-up scheduled.   2.  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 Waddell.  Most recent device interrogation showed biventricular pacing 86%.   3.  Persistent atrial fibrillation with CHA2DS2-VASc score of 7.  Asymptomatic in terms of palpitations.  Continues on Xarelto  20 mg daily for stroke prophylaxis.  Creatinine clearance is 64.  She has had persistent arrhythmia since June, may need to consider cardioversion if heart failure symptoms do not improve with medication adjustments.   4.  Chronic lymphedema.  Increasing diuretics as discussed above and has as needed mechanical compression available at home.   5.  Primary hypertension.  Continue hydralazine  25 mg 3 times a day.  Disposition:  Follow up 1 month.  Signed, Jayson JUDITHANN Sierras, M.D., F.A.C.C. [facility] at [facility]

## 2024-05-29 NOTE — Patient Instructions (Signed)
 Medication Instructions:   INCREASE Lasix  to 80 mg am and 40 pm afternoon  START Kerendia 10 mg daily  Labwork: BMET in 2 weeks  Testing/Procedures: Your physician has requested that you have an echocardiogram. Echocardiography is a painless test that uses sound waves to create images of your heart. It provides your doctor with information about the size and shape of your heart and how well your heart's chambers and valves are working. This procedure takes approximately one hour. There are no restrictions for this procedure. Please do NOT wear cologne, perfume, aftershave, or lotions (deodorant is allowed). Please arrive 15 minutes prior to your appointment time.  Please note: We ask at that you not bring children with you during ultrasound (echo/ vascular) testing. Due to room size and safety concerns, children are not allowed in the ultrasound rooms during exams. Our front office staff cannot provide observation of children in our lobby area while testing is being conducted. An adult accompanying a patient to their appointment will only be allowed in the ultrasound room at the discretion of the ultrasound technician under special circumstances. We apologize for any inconvenience.   Follow-Up: 1 month  Any Other Special Instructions Will Be Listed Below (If Applicable).  If you need a refill on your cardiac medications before your next appointment, please call your pharmacy.

## 2024-05-30 ENCOUNTER — Ambulatory Visit (HOSPITAL_COMMUNITY)
Admission: RE | Admit: 2024-05-30 | Discharge: 2024-05-30 | Disposition: A | Discharge status: Home / Self Care | Source: Ambulatory Visit | Attending: Cardiology | Admitting: Cardiology

## 2024-05-30 ENCOUNTER — Other Ambulatory Visit (HOSPITAL_COMMUNITY): Payer: Self-pay

## 2024-05-30 ENCOUNTER — Ambulatory Visit: Payer: Self-pay | Admitting: Cardiology

## 2024-05-30 DIAGNOSIS — I5020 Unspecified systolic (congestive) heart failure: Secondary | ICD-10-CM | POA: Insufficient documentation

## 2024-05-30 DIAGNOSIS — I428 Other cardiomyopathies: Secondary | ICD-10-CM

## 2024-05-30 LAB — ECHOCARDIOGRAM COMPLETE
Area-P 1/2: 3.42 cm2
MV M vel: 5.62 m/s
MV Peak grad: 126.3 mmHg
S' Lateral: 3.1 cm

## 2024-05-30 NOTE — Telephone Encounter (Signed)
 Pharmacy Patient Advocate Encounter  Received notification from [employer] that Prior Authorization for [name] has been APPROVED from 07/18/23 to 07/16/25. Ran test claim, Copay is $0.00--3 months. This test claim was processed through [facility]- copay amounts may vary at other pharmacies due to pharmacy/plan contracts, or as the patient moves through the different stages of their insurance plan.   PA #/Case ID/Reference #: [id number]

## 2024-05-30 NOTE — Progress Notes (Signed)
*  PRELIMINARY RESULTS* Echocardiogram 2D Echocardiogram has been performed.  [name] 05/30/2024, 11:42 AM

## 2024-06-14 LAB — BASIC METABOLIC PANEL WITH GFR
BUN/Creatinine Ratio: 13 (ref 12–28)
BUN: 18 mg/dL (ref 8–27)
CO2: 28 mmol/L (ref 20–29)
Calcium: 9.5 mg/dL (ref 8.7–10.3)
Chloride: 93 mmol/L — ABNORMAL LOW (ref 96–106)
Creatinine, Ser: 1.44 mg/dL — ABNORMAL HIGH (ref 0.57–1.00)
Glucose: 107 mg/dL — ABNORMAL HIGH (ref 70–99)
Potassium: 4.7 mmol/L (ref 3.5–5.2)
Sodium: 135 mmol/L (ref 134–144)
eGFR: 36 mL/min/1.73 — ABNORMAL LOW (ref >59)

## 2024-06-24 ENCOUNTER — Other Ambulatory Visit: Payer: Self-pay | Admitting: Cardiology

## 2024-06-24 DIAGNOSIS — I4891 Unspecified atrial fibrillation: Secondary | ICD-10-CM

## 2024-06-24 NOTE — Telephone Encounter (Signed)
 Prescription refill request for Xarelto  received.  Indication: a fib Last office visit: 05/29/24 Weight: 261# Age: 83 Scr: 1.44 epic 06/13/24 CrCl: 54 ml/min

## 2024-07-03 ENCOUNTER — Encounter: Payer: Self-pay | Admitting: Cardiology

## 2024-07-03 ENCOUNTER — Ambulatory Visit: Attending: Cardiology | Admitting: Cardiology

## 2024-07-03 VITALS — BP 120/68 | HR 78 | Ht 67.0 in | Wt 245.4 lb

## 2024-07-03 DIAGNOSIS — I890 Lymphedema, not elsewhere classified: Secondary | ICD-10-CM

## 2024-07-03 DIAGNOSIS — I5020 Unspecified systolic (congestive) heart failure: Secondary | ICD-10-CM | POA: Diagnosis not present

## 2024-07-03 DIAGNOSIS — I4819 Other persistent atrial fibrillation: Secondary | ICD-10-CM | POA: Diagnosis not present

## 2024-07-03 NOTE — Patient Instructions (Signed)
 Medication Instructions:  Your physician recommends that you continue on your current medications as directed. Please refer to the Current Medication list given to you today.   Labwork: None today  Testing/Procedures:  None today  Follow-Up: 3 months Dr.McDowell or B.Strader, PA-C, S.Dunn PA-C  Any Other Special Instructions Will Be Listed Below (If Applicable).  If you need a refill on your cardiac medications before your next appointment, please call your pharmacy.

## 2024-07-03 NOTE — Progress Notes (Signed)
 Cardiology Office Note  Date: 07/03/2024   ID: Yazlyn, Wentzel 07/14/1941, MRN [id number]  History of Present Illness: [name] is an 83 y.o. female last seen in November.  She is here for a follow-up visit.  Fluid retention has improved significantly following medication adjustments made at last encounter.  Her weight is down about 15 pounds.  Still feels some heaviness in her legs and fatigue in the evenings, not using her mechanical compression for lymphedema as regularly.  I reviewed her follow-up lab work and current medications.  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 Waddell.   Follow-up echocardiogram in November revealed LVEF in the range of 55 to 60%, indeterminate diastolic parameters.  RVSP estimated at approximately 41 mmHg.  RV contraction also normal.  Physical Exam: VS:  BP 120/68 (BP Location: Left Arm, Cuff Size: Large)   Pulse 78   Ht 5' 7 (1.702 m)   Wt 245 lb 6.4 oz (111.3 kg)   SpO2 98%   BMI 38.44 kg/m , BMI Body mass index is 38.44 kg/m.  Wt Readings from Last 3 Encounters:  07/03/24 245 lb 6.4 oz (111.3 kg)  05/29/24 261 lb 12.8 oz (118.8 kg)  03/26/24 250 lb (113.4 kg)    General: Patient appears comfortable at rest. HEENT: Conjunctiva and lids normal Neck: Supple, no elevated JVP or carotid bruits. Lungs: Clear to auscultation, nonlabored breathing at rest. Cardiac: Regular rate and rhythm, no S3 or significant systolic murmur. Extremities: Chronic appearing bilateral lymphedema.  ECG:  An ECG dated 05/29/2024 was personally reviewed today and demonstrated:  Ventricular pacing with underlying atrial fibrillation.  Labwork: 06/13/2024: BUN 18; Creatinine, Ser 1.44; Potassium 4.7; Sodium 135     Component Value Date/Time   CHOL 205 (H) 07/21/2019 0904   TRIG 77 07/21/2019 0904   HDL 47 (L) 07/21/2019 0904   CHOLHDL 4.4 07/21/2019 0904   LDLCALC 141 (H) 07/21/2019 0904   Other Studies Reviewed  Today:  Echocardiogram 05/30/2024:  1. Left ventricular ejection fraction, by estimation, is 55 to 60%. Left  ventricular ejection fraction by 3D volume is 53 %. The left ventricle has  normal function. The left ventricle has no regional wall motion  abnormalities. Diastolic function could  not be evaluated due to paced rhythm.   2. Right ventricular systolic function is normal. The right ventricular  size is normal. There is mildly elevated pulmonary artery systolic  pressure. The estimated right ventricular systolic pressure is 41.4 mmHg.   3. Left atrial size was severely dilated.   4. Right atrial size was mildly dilated.   5. The mitral valve is normal in structure. Mild mitral valve  regurgitation. No evidence of mitral stenosis.   6. The aortic valve is tricuspid. Aortic valve regurgitation is mild. No  aortic stenosis is present.   7. The inferior vena cava is dilated in size with <50% respiratory  variability, suggesting right atrial pressure of 15 mmHg.   Assessment and Plan:  1.  HFrecEF with history of nonischemic cardiomyopathy.  Follow-up echocardiogram in November showed improvement in LVEF to the range of 55 to 60%.  RV contraction normal with RVSP estimated at 41 mmHg.  Fluid retention more consistent with HFpEF has improved following medication adjustments, weight is down about 15 pounds.  Plan to continue Coreg  25 mg 1-1/2 tablets twice daily, Lanoxin  0.125 mg on Monday through Friday, Kerendia  10 mg daily, and Lasix  80 mg in the morning and 40 mg  in the afternoon.   2.  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 Waddell.  Most recent device interrogation showed biventricular pacing 86%.   3.  Persistent atrial fibrillation with CHA2DS2-VASc score of 7.  Asymptomatic in terms of palpitations.  She has had persistent arrhythmia since June.  Ventricular pacing at baseline.  Continue Xarelto  20 mg daily for stroke prophylaxis.  Creatinine clearance 51.    4.  Chronic lymphedema.  Recommended more regular use of mechanical compression at home.   5.  Primary hypertension.  Blood pressure better controlled today.  Continue hydralazine  25 mg 3 times a day and Lotensin  40 mg daily in addition to the above.  Disposition:  Follow up 3 months.  Signed, Jayson JUDITHANN Sierras, M.D., F.A.C.C. [facility]

## 2024-07-11 ENCOUNTER — Ambulatory Visit: Payer: Medicare HMO

## 2024-07-11 DIAGNOSIS — I4819 Other persistent atrial fibrillation: Secondary | ICD-10-CM | POA: Diagnosis not present

## 2024-07-13 ENCOUNTER — Ambulatory Visit: Payer: Self-pay | Admitting: Student in an Organized Health Care Education/Training Program

## 2024-07-13 LAB — CUP PACEART REMOTE DEVICE CHECK
Battery Remaining Longevity: 84 mo
Battery Voltage: 2.97 V
Brady Statistic RA Percent Paced: 0.53 %
Brady Statistic RV Percent Paced: 90.29 %
Date Time Interrogation Session: 20251226002655
Implantable Lead Connection Status: 753985
Implantable Lead Connection Status: 753985
Implantable Lead Connection Status: 753985
Implantable Lead Implant Date: 20150417
Implantable Lead Implant Date: 20150417
Implantable Lead Implant Date: 20150417
Implantable Lead Location: 753858
Implantable Lead Location: 753859
Implantable Lead Location: 753860
Implantable Lead Model: 4298
Implantable Lead Model: 5076
Implantable Lead Model: 6935
Implantable Pulse Generator Implant Date: 20220603
Lead Channel Impedance Value: 323 Ohm
Lead Channel Impedance Value: 342 Ohm
Lead Channel Impedance Value: 342 Ohm
Lead Channel Impedance Value: 342 Ohm
Lead Channel Impedance Value: 342 Ohm
Lead Channel Impedance Value: 342 Ohm
Lead Channel Impedance Value: 361 Ohm
Lead Channel Impedance Value: 399 Ohm
Lead Channel Impedance Value: 532 Ohm
Lead Channel Impedance Value: 589 Ohm
Lead Channel Impedance Value: 589 Ohm
Lead Channel Impedance Value: 627 Ohm
Lead Channel Impedance Value: 646 Ohm
Lead Channel Impedance Value: 646 Ohm
Lead Channel Pacing Threshold Amplitude: 0.75 V
Lead Channel Pacing Threshold Amplitude: 0.875 V
Lead Channel Pacing Threshold Amplitude: 1.125 V
Lead Channel Pacing Threshold Pulse Width: 0.4 ms
Lead Channel Pacing Threshold Pulse Width: 0.4 ms
Lead Channel Pacing Threshold Pulse Width: 0.6 ms
Lead Channel Sensing Intrinsic Amplitude: 0.875 mV
Lead Channel Sensing Intrinsic Amplitude: 0.875 mV
Lead Channel Sensing Intrinsic Amplitude: 7.25 mV
Lead Channel Sensing Intrinsic Amplitude: 7.25 mV
Lead Channel Setting Pacing Amplitude: 1.75 V
Lead Channel Setting Pacing Amplitude: 1.75 V
Lead Channel Setting Pacing Amplitude: 2 V
Lead Channel Setting Pacing Pulse Width: 0.4 ms
Lead Channel Setting Pacing Pulse Width: 0.6 ms
Lead Channel Setting Sensing Sensitivity: 0.9 mV
Zone Setting Status: 755011

## 2024-07-14 NOTE — Progress Notes (Addendum)
 Remote PPM Transmission

## 2024-08-04 ENCOUNTER — Other Ambulatory Visit: Payer: Self-pay | Admitting: Gastroenterology

## 2024-08-04 NOTE — Telephone Encounter (Signed)
 She needs ov in march/April.

## 2024-08-19 ENCOUNTER — Encounter: Payer: Self-pay | Admitting: *Deleted

## 2024-10-01 ENCOUNTER — Ambulatory Visit: Admitting: Gastroenterology

## 2024-10-22 ENCOUNTER — Ambulatory Visit: Admitting: Student
